# Patient Record
Sex: Male | Born: 1935
Health system: Southern US, Community
[De-identification: ages and names within clinical notes are randomized; demographics above are authoritative.]

## PROBLEM LIST (undated history)

## (undated) DIAGNOSIS — K219 Gastro-esophageal reflux disease without esophagitis: Secondary | ICD-10-CM

## (undated) DIAGNOSIS — R32 Unspecified urinary incontinence: Secondary | ICD-10-CM

## (undated) DIAGNOSIS — D649 Anemia, unspecified: Secondary | ICD-10-CM

## (undated) DIAGNOSIS — N319 Neuromuscular dysfunction of bladder, unspecified: Secondary | ICD-10-CM

## (undated) DIAGNOSIS — N184 Chronic kidney disease, stage 4 (severe): Secondary | ICD-10-CM

## (undated) DIAGNOSIS — I2589 Other forms of chronic ischemic heart disease: Secondary | ICD-10-CM

## (undated) DIAGNOSIS — I5042 Chronic combined systolic (congestive) and diastolic (congestive) heart failure: Secondary | ICD-10-CM

## (undated) DIAGNOSIS — J4520 Mild intermittent asthma, uncomplicated: Secondary | ICD-10-CM

## (undated) DIAGNOSIS — N19 Unspecified kidney failure: Secondary | ICD-10-CM

## (undated) DIAGNOSIS — I2510 Atherosclerotic heart disease of native coronary artery without angina pectoris: Secondary | ICD-10-CM

## (undated) DIAGNOSIS — I5022 Chronic systolic (congestive) heart failure: Secondary | ICD-10-CM

## (undated) DIAGNOSIS — L0390 Cellulitis, unspecified: Secondary | ICD-10-CM

## (undated) DIAGNOSIS — I10 Essential (primary) hypertension: Secondary | ICD-10-CM

## (undated) DIAGNOSIS — F419 Anxiety disorder, unspecified: Secondary | ICD-10-CM

## (undated) DIAGNOSIS — F329 Major depressive disorder, single episode, unspecified: Secondary | ICD-10-CM

## (undated) DIAGNOSIS — N4289 Other specified disorders of prostate: Secondary | ICD-10-CM

## (undated) DIAGNOSIS — Z7901 Long term (current) use of anticoagulants: Secondary | ICD-10-CM

## (undated) DIAGNOSIS — I499 Cardiac arrhythmia, unspecified: Secondary | ICD-10-CM

## (undated) DIAGNOSIS — I219 Acute myocardial infarction, unspecified: Secondary | ICD-10-CM

## (undated) DIAGNOSIS — R110 Nausea: Secondary | ICD-10-CM

## (undated) DIAGNOSIS — I447 Left bundle-branch block, unspecified: Secondary | ICD-10-CM

## (undated) DIAGNOSIS — N390 Urinary tract infection, site not specified: Secondary | ICD-10-CM

## (undated) DIAGNOSIS — R5381 Other malaise: Secondary | ICD-10-CM

## (undated) DIAGNOSIS — I50810 Right heart failure, unspecified: Secondary | ICD-10-CM

## (undated) DIAGNOSIS — I4821 Permanent atrial fibrillation: Secondary | ICD-10-CM

## (undated) DIAGNOSIS — E785 Hyperlipidemia, unspecified: Secondary | ICD-10-CM

## (undated) DIAGNOSIS — I5043 Acute on chronic combined systolic (congestive) and diastolic (congestive) heart failure: Secondary | ICD-10-CM

## (undated) DIAGNOSIS — I38 Endocarditis, valve unspecified: Secondary | ICD-10-CM

## (undated) DIAGNOSIS — M130 Polyarthritis, unspecified: Secondary | ICD-10-CM

## (undated) DIAGNOSIS — K869 Disease of pancreas, unspecified: Secondary | ICD-10-CM

## (undated) HISTORY — DX: Hyperlipidemia, unspecified: E78.5

## (undated) HISTORY — PX: CHOLECYSTECTOMY: SHX55

## (undated) HISTORY — DX: Acute myocardial infarction, unspecified: I21.9

## (undated) HISTORY — DX: Cardiac arrhythmia, unspecified: I49.9

## (undated) HISTORY — DX: Anxiety disorder, unspecified: F41.9

## (undated) HISTORY — DX: Long term (current) use of anticoagulants: Z79.01

## (undated) HISTORY — DX: Neuromuscular dysfunction of bladder, unspecified: N31.9

## (undated) HISTORY — DX: Essential (primary) hypertension: I10

## (undated) HISTORY — DX: Chronic systolic (congestive) heart failure: I50.22

## (undated) HISTORY — PX: CORONARY ARTERY BYPASS GRAFT: SHX141

## (undated) HISTORY — DX: Gastro-esophageal reflux disease without esophagitis: K21.9

## (undated) HISTORY — DX: Anemia, unspecified: D64.9

## (undated) HISTORY — DX: Unspecified kidney failure: N19

## (undated) HISTORY — PX: APPENDECTOMY: SHX54

---
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
## 2006-03-01 ENCOUNTER — Ambulatory Visit: Payer: Self-pay | Admitting: Family Medicine

## 2006-03-19 ENCOUNTER — Ambulatory Visit: Payer: Self-pay | Admitting: Sports Medicine

## 2006-04-17 ENCOUNTER — Ambulatory Visit: Payer: Self-pay | Admitting: Cardiology

## 2006-04-23 ENCOUNTER — Ambulatory Visit: Payer: Self-pay | Admitting: Cardiology

## 2006-05-07 ENCOUNTER — Ambulatory Visit: Payer: Self-pay | Admitting: Cardiology

## 2006-05-14 ENCOUNTER — Ambulatory Visit: Payer: Self-pay | Admitting: Cardiology

## 2006-05-23 ENCOUNTER — Ambulatory Visit: Payer: Self-pay | Admitting: Cardiovascular Disease

## 2006-06-13 ENCOUNTER — Ambulatory Visit: Payer: Self-pay | Admitting: Cardiology

## 2006-06-13 LAB — CONVERTED CEMR LAB
ALT: 17 units/L (ref 0–40)
AST: 28 units/L (ref 0–37)
Albumin: 4.1 g/dL (ref 3.5–5.2)
Alkaline Phosphatase: 46 units/L (ref 39–117)
BUN: 48 mg/dL — ABNORMAL HIGH (ref 6–23)
Chloride: 102 meq/L (ref 96–112)
Creatinine, Ser: 2.7 mg/dL — ABNORMAL HIGH (ref 0.4–1.5)
Total Bilirubin: 1 mg/dL (ref 0.3–1.2)

## 2006-07-11 ENCOUNTER — Ambulatory Visit: Payer: Self-pay | Admitting: *Deleted

## 2006-07-25 ENCOUNTER — Ambulatory Visit: Payer: Self-pay | Admitting: Cardiology

## 2006-08-06 ENCOUNTER — Ambulatory Visit: Payer: Self-pay | Admitting: Internal Medicine

## 2006-08-22 ENCOUNTER — Ambulatory Visit: Payer: Self-pay | Admitting: Internal Medicine

## 2006-08-22 ENCOUNTER — Ambulatory Visit: Payer: Self-pay | Admitting: Cardiology

## 2006-08-22 LAB — CONVERTED CEMR LAB
BUN: 36 mg/dL — ABNORMAL HIGH (ref 6–23)
CO2: 32 meq/L (ref 19–32)
Chloride: 101 meq/L (ref 96–112)
Creatinine, Ser: 2.1 mg/dL — ABNORMAL HIGH (ref 0.4–1.5)
GFR calc Af Amer: 40 mL/min
Glucose, Bld: 89 mg/dL (ref 70–99)
Potassium: 4.6 meq/L (ref 3.5–5.1)

## 2006-09-07 ENCOUNTER — Encounter: Payer: Self-pay | Admitting: Internal Medicine

## 2006-09-10 ENCOUNTER — Ambulatory Visit: Payer: Self-pay | Admitting: Internal Medicine

## 2006-09-10 DIAGNOSIS — K219 Gastro-esophageal reflux disease without esophagitis: Secondary | ICD-10-CM

## 2006-09-10 DIAGNOSIS — E785 Hyperlipidemia, unspecified: Secondary | ICD-10-CM | POA: Insufficient documentation

## 2006-09-10 DIAGNOSIS — I10 Essential (primary) hypertension: Secondary | ICD-10-CM

## 2006-09-10 DIAGNOSIS — D649 Anemia, unspecified: Secondary | ICD-10-CM

## 2006-09-10 DIAGNOSIS — R32 Unspecified urinary incontinence: Secondary | ICD-10-CM

## 2006-09-10 HISTORY — DX: Anemia, unspecified: D64.9

## 2006-09-10 HISTORY — DX: Essential (primary) hypertension: I10

## 2006-09-10 HISTORY — DX: Unspecified urinary incontinence: R32

## 2006-10-04 ENCOUNTER — Ambulatory Visit: Payer: Self-pay | Admitting: Internal Medicine

## 2006-10-04 DIAGNOSIS — I4821 Permanent atrial fibrillation: Secondary | ICD-10-CM

## 2006-10-04 HISTORY — DX: Permanent atrial fibrillation: I48.21

## 2006-10-04 LAB — CONVERTED CEMR LAB

## 2006-11-08 ENCOUNTER — Telehealth (INDEPENDENT_AMBULATORY_CARE_PROVIDER_SITE_OTHER): Payer: Self-pay | Admitting: *Deleted

## 2006-11-08 ENCOUNTER — Ambulatory Visit: Payer: Self-pay | Admitting: Internal Medicine

## 2006-11-19 ENCOUNTER — Ambulatory Visit: Payer: Self-pay | Admitting: Cardiology

## 2006-11-20 ENCOUNTER — Ambulatory Visit: Payer: Self-pay | Admitting: Internal Medicine

## 2006-12-12 ENCOUNTER — Ambulatory Visit: Payer: Self-pay | Admitting: Internal Medicine

## 2006-12-12 DIAGNOSIS — F4323 Adjustment disorder with mixed anxiety and depressed mood: Secondary | ICD-10-CM | POA: Insufficient documentation

## 2006-12-12 LAB — CONVERTED CEMR LAB: INR: 2.9

## 2006-12-25 ENCOUNTER — Telehealth: Payer: Self-pay | Admitting: Internal Medicine

## 2007-01-01 ENCOUNTER — Ambulatory Visit: Payer: Self-pay | Admitting: Internal Medicine

## 2007-01-01 LAB — CONVERTED CEMR LAB: INR: 2.1

## 2007-01-04 ENCOUNTER — Telehealth (INDEPENDENT_AMBULATORY_CARE_PROVIDER_SITE_OTHER): Payer: Self-pay | Admitting: *Deleted

## 2007-01-16 ENCOUNTER — Telehealth (INDEPENDENT_AMBULATORY_CARE_PROVIDER_SITE_OTHER): Payer: Self-pay | Admitting: *Deleted

## 2007-02-14 ENCOUNTER — Ambulatory Visit: Payer: Self-pay | Admitting: Internal Medicine

## 2007-02-25 ENCOUNTER — Ambulatory Visit: Payer: Self-pay | Admitting: Cardiology

## 2007-03-08 ENCOUNTER — Ambulatory Visit: Payer: Self-pay | Admitting: Internal Medicine

## 2007-03-08 LAB — CONVERTED CEMR LAB: INR: 1.8

## 2007-03-18 ENCOUNTER — Telehealth (INDEPENDENT_AMBULATORY_CARE_PROVIDER_SITE_OTHER): Payer: Self-pay | Admitting: *Deleted

## 2007-03-18 ENCOUNTER — Ambulatory Visit: Payer: Self-pay | Admitting: Internal Medicine

## 2007-03-18 DIAGNOSIS — N19 Unspecified kidney failure: Secondary | ICD-10-CM

## 2007-03-28 ENCOUNTER — Ambulatory Visit: Payer: Self-pay | Admitting: Internal Medicine

## 2007-03-29 ENCOUNTER — Encounter: Payer: Self-pay | Admitting: Internal Medicine

## 2007-04-15 ENCOUNTER — Ambulatory Visit: Payer: Self-pay | Admitting: Internal Medicine

## 2007-04-15 LAB — CONVERTED CEMR LAB: INR: 2.1

## 2007-05-16 ENCOUNTER — Ambulatory Visit: Payer: Self-pay | Admitting: Internal Medicine

## 2007-05-16 LAB — CONVERTED CEMR LAB: INR: 2

## 2007-05-22 ENCOUNTER — Ambulatory Visit: Payer: Self-pay | Admitting: Cardiology

## 2007-06-17 ENCOUNTER — Ambulatory Visit: Payer: Self-pay | Admitting: Internal Medicine

## 2007-06-17 DIAGNOSIS — I2589 Other forms of chronic ischemic heart disease: Secondary | ICD-10-CM

## 2007-06-17 HISTORY — DX: Other forms of chronic ischemic heart disease: I25.89

## 2007-06-17 LAB — CONVERTED CEMR LAB: INR: 1.7

## 2007-06-27 ENCOUNTER — Emergency Department (HOSPITAL_COMMUNITY): Admission: EM | Admit: 2007-06-27 | Discharge: 2007-06-27 | Payer: Self-pay | Admitting: Emergency Medicine

## 2007-07-01 LAB — CONVERTED CEMR LAB
BUN: 40 mg/dL — ABNORMAL HIGH (ref 6–23)
Calcium: 9.9 mg/dL (ref 8.4–10.5)
Creatinine, Ser: 2.2 mg/dL — ABNORMAL HIGH (ref 0.4–1.5)
Eosinophils Absolute: 0.2 10*3/uL (ref 0.0–0.7)
Eosinophils Relative: 2.4 % (ref 0.0–5.0)
GFR calc Af Amer: 38 mL/min
GFR calc non Af Amer: 31 mL/min
Glucose, Bld: 93 mg/dL (ref 70–99)
HCT: 39.3 % (ref 39.0–52.0)
HDL: 30.6 mg/dL — ABNORMAL LOW (ref >39.0)
Hemoglobin: 13.1 g/dL (ref 13.0–17.0)
MCV: 88.2 fL (ref 78.0–100.0)
Monocytes Absolute: 0.5 10*3/uL (ref 0.1–1.0)
Monocytes Relative: 8.3 % (ref 3.0–12.0)
Neutro Abs: 4.2 10*3/uL (ref 1.4–7.7)
Platelets: 218 10*3/uL (ref 150–400)
Potassium: 4.6 meq/L (ref 3.5–5.1)
RDW: 13.6 % (ref 11.5–14.6)
Saturation Ratios: 15 % — ABNORMAL LOW (ref 20.0–50.0)
TSH: 1.62 microintl units/mL (ref 0.35–5.50)
Triglycerides: 104 mg/dL (ref 0–149)
WBC: 6.5 10*3/uL (ref 4.5–10.5)

## 2007-07-31 ENCOUNTER — Ambulatory Visit: Payer: Self-pay | Admitting: Internal Medicine

## 2007-07-31 LAB — CONVERTED CEMR LAB
INR: 3.2
Prothrombin Time: 21.7 s

## 2007-08-07 ENCOUNTER — Encounter: Payer: Self-pay | Admitting: Internal Medicine

## 2007-08-17 ENCOUNTER — Emergency Department (HOSPITAL_BASED_OUTPATIENT_CLINIC_OR_DEPARTMENT_OTHER): Admission: EM | Admit: 2007-08-17 | Discharge: 2007-08-18 | Payer: Self-pay | Admitting: Emergency Medicine

## 2007-08-19 ENCOUNTER — Ambulatory Visit: Payer: Self-pay | Admitting: Internal Medicine

## 2007-08-19 LAB — CONVERTED CEMR LAB
INR: 4.1
Prothrombin Time: 24.6 s
WBC, UA: NONE SEEN cells/hpf (ref <3)

## 2007-08-20 ENCOUNTER — Encounter: Payer: Self-pay | Admitting: Internal Medicine

## 2007-08-21 ENCOUNTER — Ambulatory Visit: Payer: Self-pay | Admitting: Internal Medicine

## 2007-08-21 LAB — CONVERTED CEMR LAB: INR: 1.9

## 2007-09-03 ENCOUNTER — Ambulatory Visit: Payer: Self-pay | Admitting: Cardiology

## 2007-09-04 ENCOUNTER — Ambulatory Visit: Payer: Self-pay | Admitting: Internal Medicine

## 2007-09-04 LAB — CONVERTED CEMR LAB: INR: 2.2

## 2007-09-05 ENCOUNTER — Encounter: Payer: Self-pay | Admitting: Cardiology

## 2007-09-05 ENCOUNTER — Ambulatory Visit: Payer: Self-pay

## 2007-09-16 ENCOUNTER — Ambulatory Visit: Payer: Self-pay | Admitting: Internal Medicine

## 2007-09-16 LAB — CONVERTED CEMR LAB
INR: 3.4
Prothrombin Time: 22.4 s

## 2007-10-11 ENCOUNTER — Ambulatory Visit: Payer: Self-pay | Admitting: Internal Medicine

## 2007-10-11 LAB — CONVERTED CEMR LAB
INR: 1.6
Prothrombin Time: 15.6 s

## 2007-10-31 ENCOUNTER — Emergency Department (HOSPITAL_BASED_OUTPATIENT_CLINIC_OR_DEPARTMENT_OTHER): Admission: EM | Admit: 2007-10-31 | Discharge: 2007-10-31 | Payer: Self-pay | Admitting: Emergency Medicine

## 2007-11-01 ENCOUNTER — Ambulatory Visit: Payer: Self-pay | Admitting: Internal Medicine

## 2007-11-01 LAB — CONVERTED CEMR LAB: Prothrombin Time: 16.9 s

## 2007-11-13 ENCOUNTER — Ambulatory Visit: Payer: Self-pay | Admitting: Internal Medicine

## 2007-11-19 ENCOUNTER — Encounter (INDEPENDENT_AMBULATORY_CARE_PROVIDER_SITE_OTHER): Payer: Self-pay | Admitting: *Deleted

## 2007-12-02 ENCOUNTER — Ambulatory Visit: Payer: Self-pay | Admitting: Internal Medicine

## 2007-12-02 LAB — CONVERTED CEMR LAB: INR: 1.8

## 2007-12-04 ENCOUNTER — Ambulatory Visit: Payer: Self-pay | Admitting: Cardiology

## 2007-12-20 ENCOUNTER — Ambulatory Visit: Payer: Self-pay | Admitting: Internal Medicine

## 2007-12-20 LAB — CONVERTED CEMR LAB: Prothrombin Time: 25.4 s

## 2007-12-26 ENCOUNTER — Ambulatory Visit: Payer: Self-pay | Admitting: Internal Medicine

## 2007-12-26 LAB — CONVERTED CEMR LAB
INR: 1.5
Prothrombin Time: 15.1 s

## 2008-01-03 ENCOUNTER — Ambulatory Visit: Payer: Self-pay | Admitting: Internal Medicine

## 2008-01-03 LAB — CONVERTED CEMR LAB: Prothrombin Time: 18 s

## 2008-01-31 ENCOUNTER — Ambulatory Visit: Payer: Self-pay | Admitting: Internal Medicine

## 2008-01-31 LAB — CONVERTED CEMR LAB: INR: 2.5

## 2008-02-12 ENCOUNTER — Ambulatory Visit: Payer: Self-pay | Admitting: Internal Medicine

## 2008-02-12 LAB — CONVERTED CEMR LAB: Prothrombin Time: 17.4 s

## 2008-02-25 ENCOUNTER — Ambulatory Visit: Payer: Self-pay | Admitting: Internal Medicine

## 2008-03-16 ENCOUNTER — Ambulatory Visit: Payer: Self-pay | Admitting: Internal Medicine

## 2008-03-16 LAB — CONVERTED CEMR LAB
INR: 1.8
Prothrombin Time: 16.4 s

## 2008-04-06 ENCOUNTER — Ambulatory Visit: Payer: Self-pay | Admitting: Internal Medicine

## 2008-04-06 LAB — CONVERTED CEMR LAB: Prothrombin Time: 16.9 s

## 2008-04-27 ENCOUNTER — Ambulatory Visit: Payer: Self-pay | Admitting: Internal Medicine

## 2008-04-27 LAB — CONVERTED CEMR LAB
INR: 1.6
Prothrombin Time: 15.7 s

## 2008-05-08 ENCOUNTER — Ambulatory Visit: Payer: Self-pay | Admitting: Internal Medicine

## 2008-05-20 ENCOUNTER — Ambulatory Visit: Payer: Self-pay | Admitting: Internal Medicine

## 2008-05-20 LAB — CONVERTED CEMR LAB
INR: 2.8
Prothrombin Time: 20.2 s

## 2008-06-10 ENCOUNTER — Ambulatory Visit: Payer: Self-pay | Admitting: Internal Medicine

## 2008-06-10 LAB — CONVERTED CEMR LAB: Prothrombin Time: 16.3 s

## 2008-06-12 ENCOUNTER — Encounter (INDEPENDENT_AMBULATORY_CARE_PROVIDER_SITE_OTHER): Payer: Self-pay | Admitting: *Deleted

## 2008-06-19 ENCOUNTER — Ambulatory Visit: Payer: Self-pay | Admitting: Internal Medicine

## 2008-06-19 LAB — CONVERTED CEMR LAB
INR: 1.9
Prothrombin Time: 16.8 s

## 2008-07-02 ENCOUNTER — Ambulatory Visit: Payer: Self-pay | Admitting: Internal Medicine

## 2008-07-02 LAB — CONVERTED CEMR LAB: Prothrombin Time: 16.3 s

## 2008-07-10 ENCOUNTER — Encounter: Payer: Self-pay | Admitting: Internal Medicine

## 2008-07-17 ENCOUNTER — Ambulatory Visit: Payer: Self-pay | Admitting: Family Medicine

## 2008-07-31 ENCOUNTER — Ambulatory Visit: Payer: Self-pay | Admitting: Internal Medicine

## 2008-07-31 LAB — CONVERTED CEMR LAB
INR: 1.7
Prothrombin Time: 16.1 s

## 2008-08-07 ENCOUNTER — Ambulatory Visit: Payer: Self-pay | Admitting: Cardiology

## 2008-08-21 ENCOUNTER — Ambulatory Visit: Payer: Self-pay | Admitting: Internal Medicine

## 2008-08-21 LAB — CONVERTED CEMR LAB: INR: 2.1

## 2008-09-10 ENCOUNTER — Telehealth (INDEPENDENT_AMBULATORY_CARE_PROVIDER_SITE_OTHER): Payer: Self-pay | Admitting: *Deleted

## 2008-09-10 ENCOUNTER — Ambulatory Visit: Payer: Self-pay | Admitting: Family Medicine

## 2008-09-10 LAB — CONVERTED CEMR LAB
INR: 2.3
Prothrombin Time: 18.7 s

## 2008-10-14 ENCOUNTER — Ambulatory Visit: Payer: Self-pay | Admitting: Internal Medicine

## 2008-10-14 LAB — CONVERTED CEMR LAB: INR: 2.8

## 2008-10-27 ENCOUNTER — Telehealth: Payer: Self-pay | Admitting: Internal Medicine

## 2008-11-05 ENCOUNTER — Telehealth (INDEPENDENT_AMBULATORY_CARE_PROVIDER_SITE_OTHER): Payer: Self-pay | Admitting: *Deleted

## 2008-11-18 ENCOUNTER — Encounter (INDEPENDENT_AMBULATORY_CARE_PROVIDER_SITE_OTHER): Payer: Self-pay | Admitting: *Deleted

## 2008-11-19 ENCOUNTER — Ambulatory Visit: Payer: Self-pay | Admitting: Internal Medicine

## 2008-12-24 ENCOUNTER — Ambulatory Visit: Payer: Self-pay | Admitting: Internal Medicine

## 2009-01-14 ENCOUNTER — Encounter (INDEPENDENT_AMBULATORY_CARE_PROVIDER_SITE_OTHER): Payer: Self-pay | Admitting: *Deleted

## 2009-01-25 ENCOUNTER — Telehealth (INDEPENDENT_AMBULATORY_CARE_PROVIDER_SITE_OTHER): Payer: Self-pay | Admitting: *Deleted

## 2009-01-26 ENCOUNTER — Telehealth (INDEPENDENT_AMBULATORY_CARE_PROVIDER_SITE_OTHER): Payer: Self-pay | Admitting: *Deleted

## 2009-01-28 ENCOUNTER — Ambulatory Visit: Payer: Self-pay | Admitting: Internal Medicine

## 2009-01-28 LAB — CONVERTED CEMR LAB: INR: 1.5

## 2009-02-15 ENCOUNTER — Ambulatory Visit: Payer: Self-pay | Admitting: Internal Medicine

## 2009-02-15 LAB — CONVERTED CEMR LAB: Prothrombin Time: 21.5 s — ABNORMAL HIGH (ref 9.1–11.7)

## 2009-04-01 ENCOUNTER — Ambulatory Visit: Payer: Self-pay | Admitting: Internal Medicine

## 2009-04-01 LAB — CONVERTED CEMR LAB
INR: 2.6 — ABNORMAL HIGH (ref 0.8–1.0)
Prothrombin Time: 26.9 s — ABNORMAL HIGH (ref 9.1–11.7)

## 2009-06-03 ENCOUNTER — Ambulatory Visit: Payer: Self-pay | Admitting: Internal Medicine

## 2009-06-10 ENCOUNTER — Telehealth (INDEPENDENT_AMBULATORY_CARE_PROVIDER_SITE_OTHER): Payer: Self-pay | Admitting: *Deleted

## 2009-06-16 ENCOUNTER — Ambulatory Visit: Payer: Self-pay | Admitting: Cardiology

## 2009-06-16 DIAGNOSIS — I447 Left bundle-branch block, unspecified: Secondary | ICD-10-CM | POA: Insufficient documentation

## 2009-06-16 HISTORY — DX: Left bundle-branch block, unspecified: I44.7

## 2009-06-17 ENCOUNTER — Encounter (INDEPENDENT_AMBULATORY_CARE_PROVIDER_SITE_OTHER): Payer: Self-pay | Admitting: *Deleted

## 2009-07-02 ENCOUNTER — Ambulatory Visit: Payer: Self-pay | Admitting: Internal Medicine

## 2009-07-08 ENCOUNTER — Ambulatory Visit: Payer: Self-pay | Admitting: Internal Medicine

## 2009-07-13 ENCOUNTER — Telehealth: Payer: Self-pay | Admitting: Internal Medicine

## 2009-07-13 LAB — CONVERTED CEMR LAB
Folate: >20 ng/mL
HDL: 30.1 mg/dL — ABNORMAL LOW (ref >39.00)
Total CHOL/HDL Ratio: 4
Triglycerides: 93 mg/dL (ref 0.0–149.0)
Vitamin B-12: 610 pg/mL (ref 211–911)

## 2009-07-29 ENCOUNTER — Ambulatory Visit: Payer: Self-pay | Admitting: Internal Medicine

## 2009-07-29 LAB — CONVERTED CEMR LAB: INR: 1.6

## 2009-08-02 ENCOUNTER — Encounter: Payer: Self-pay | Admitting: Internal Medicine

## 2009-08-13 ENCOUNTER — Ambulatory Visit: Payer: Self-pay | Admitting: Internal Medicine

## 2009-08-13 LAB — CONVERTED CEMR LAB: INR: 2.5

## 2009-09-07 ENCOUNTER — Ambulatory Visit: Payer: Self-pay | Admitting: Internal Medicine

## 2009-09-07 LAB — CONVERTED CEMR LAB: INR: 2

## 2009-09-29 ENCOUNTER — Ambulatory Visit: Payer: Self-pay | Admitting: Internal Medicine

## 2009-11-03 ENCOUNTER — Ambulatory Visit: Payer: Self-pay | Admitting: Internal Medicine

## 2009-12-02 ENCOUNTER — Ambulatory Visit: Payer: Self-pay | Admitting: Internal Medicine

## 2009-12-02 LAB — CONVERTED CEMR LAB: INR: 2

## 2010-01-25 ENCOUNTER — Ambulatory Visit: Payer: Self-pay | Admitting: Internal Medicine

## 2010-02-10 ENCOUNTER — Ambulatory Visit: Payer: Self-pay | Admitting: Internal Medicine

## 2010-02-18 ENCOUNTER — Ambulatory Visit: Payer: Self-pay | Admitting: Internal Medicine

## 2010-02-18 LAB — CONVERTED CEMR LAB: INR: 2.3

## 2010-03-05 LAB — POCT INR
INR: 1.7
INR: 1.7

## 2010-03-27 LAB — CONVERTED CEMR LAB
ALT: 12 units/L (ref 0–53)
BUN: 39 mg/dL — ABNORMAL HIGH (ref 6–23)
Bilirubin, Direct: 0.1 mg/dL (ref 0.0–0.3)
Calcium: 9.5 mg/dL (ref 8.4–10.5)
Creatinine, Ser: 2.2 mg/dL — ABNORMAL HIGH (ref 0.4–1.5)
Eosinophils Relative: 2.1 % (ref 0.0–5.0)
GFR calc non Af Amer: 30.9 mL/min (ref >60)
INR: 1.9 — ABNORMAL HIGH (ref 0.8–1.0)
Monocytes Absolute: 0.3 10*3/uL (ref 0.1–1.0)
Monocytes Relative: 5.9 % (ref 3.0–12.0)
Neutrophils Relative %: 69.1 % (ref 43.0–77.0)
Platelets: 221 10*3/uL (ref 150.0–400.0)
Total Bilirubin: 0.4 mg/dL (ref 0.3–1.2)
WBC: 5.4 10*3/uL (ref 4.5–10.5)

## 2010-03-31 ENCOUNTER — Encounter: Payer: Self-pay | Admitting: Internal Medicine

## 2010-03-31 ENCOUNTER — Other Ambulatory Visit: Payer: Self-pay | Admitting: Internal Medicine

## 2010-03-31 ENCOUNTER — Ambulatory Visit (INDEPENDENT_AMBULATORY_CARE_PROVIDER_SITE_OTHER): Payer: Medicare Other | Admitting: Internal Medicine

## 2010-03-31 DIAGNOSIS — E785 Hyperlipidemia, unspecified: Secondary | ICD-10-CM

## 2010-03-31 DIAGNOSIS — Z5181 Encounter for therapeutic drug level monitoring: Secondary | ICD-10-CM

## 2010-03-31 DIAGNOSIS — I10 Essential (primary) hypertension: Secondary | ICD-10-CM

## 2010-03-31 DIAGNOSIS — D649 Anemia, unspecified: Secondary | ICD-10-CM

## 2010-03-31 DIAGNOSIS — I4891 Unspecified atrial fibrillation: Secondary | ICD-10-CM

## 2010-03-31 DIAGNOSIS — Z79899 Other long term (current) drug therapy: Secondary | ICD-10-CM

## 2010-03-31 LAB — IBC PANEL: Saturation Ratios: 15.2 % — ABNORMAL LOW (ref 20.0–50.0)

## 2010-03-31 LAB — BASIC METABOLIC PANEL
CO2: 31 mEq/L (ref 19–32)
Calcium: 9.2 mg/dL (ref 8.4–10.5)
Creatinine, Ser: 2.4 mg/dL — ABNORMAL HIGH (ref 0.4–1.5)
Glucose, Bld: 88 mg/dL (ref 70–99)

## 2010-03-31 LAB — HEMOGLOBIN: Hemoglobin: 13.1 g/dL (ref 13.0–17.0)

## 2010-03-31 LAB — CONVERTED CEMR LAB: INR: 2.4

## 2010-03-31 NOTE — Progress Notes (Signed)
Summary: results  Phone Note Outgoing Call Call back at Home Phone [phone]   Reason for Call: Discuss lab or test results Details for Reason: advise patient cholesterol is well controlled Vitamins level normal He continued to have iron deficiency anemia, needs a referral to GI. Further workup? Signed by Alda Berthold Paz MD on 07/12/2009 at 9:02 AM  Summary of Call: Patient stated that he had a work up several years ago [place] for his [place].  All test were negative.  was advised to take MVI with Fe...Marland KitchenMarland Kitchen. does not want GI referral.   suggestions? Dawson Bills  Jul 13, 2009 3:56 PM   Follow-up for Phone Call        I strongly rec GI evaluation, they may or may not recommend more testing. If they do, he may change his mind and let them proceed .  Jose E. Paz MD  Jul 13, 2009 9:49 PM  Patient refuses GI referral.  Statates he has had wkup & will not have another one.  Previous wkup showed anemia was from coumadin & bleeding from cath.........Marland KitchenDawson Bills  Jul 14, 2009 4:18 PM

## 2010-03-31 NOTE — Assessment & Plan Note (Signed)
Summary: pt/swh   Nurse Visit   Vital Signs:  Patient profile:   77 year old male Height:      74 inches Weight:      235 pounds BMI:     30.28 Pulse rate:   64 / minute BP sitting:   120 / 60  Vitals Entered By: Verdie Mosher (July 29, 2009 8:45 AM)  Allergies: 1)  ! Cardizem 2)  ! Detrol La (Tolterodine Tartrate) 3)  ! Sudafed 4)  ! Darvocet 5)  ! Cipro 6)  ! Sulfa 7)  ! Niaspan (Niacin (Antihyperlipidemic)) 8)  ! Advicor (Niacin-Lovastatin) 9)  ! Paxil 10)  ! Amoxicillin 11)  ! Hydrocodone-Acetaminophen (Hydrocodone-Acetaminophen) Laboratory Results   Blood Tests      INR: 1.6   (Normal Range: 0.88-1.12   Therap INR: 2.0-3.5) Comments: current dose 2.5mg  daily except 3.5mg  on M,W,F PT MISSED 3 DAYS, HAVING CATH CHANGED TOMORROW 07/30/09 Per Dr Zachery Dauer current dose,Recheck 2 weeks,  patient informed ----------------------------- noted Tricia Oaxaca E. Matraca Hunkins MD  July 29, 2009 1:14 PM     Orders Added: 1)  Est. Patient Level I [id number] 2)  Protime [id number]

## 2010-03-31 NOTE — Assessment & Plan Note (Signed)
Summary: pt check/kn   Nurse Visit   Vital Signs:  Patient profile:   77 year old male Weight:      230 pounds Pulse rate:   87 / minute Pulse rhythm:   regular BP sitting:   130 / 82  (left arm) Cuff size:   large  Vitals Entered By: Allyn Kenner CMA (November 03, 2009 1:11 PM) CC: Pt/INR   Current Medications (verified): 1)  Clonazepam 1 Mg  Tabs (Clonazepam) .... One By Mouth Twice A Day and Two At Bedtime As Needed 2)  Omeprazole 20 Mg  Cpdr (Omeprazole) .Marland Kitchen.. 1 Once Daily-Office Visit and Labs Due Now 3)  Enalapril Maleate 20 Mg  Tabs (Enalapril Maleate) .Marland Kitchen.. 1 Tab Two Times A Day 4)  Furosemide 40 Mg  Tabs (Furosemide) .... 2 Qam 5)  Carvedilol 25 Mg  Tabs (Carvedilol) .Marland Kitchen.. 1 Tab Two Times A Day 6)  Klor-Con M10 10 Meq  Tbcr (Potassium Chloride Crys Cr) .Marland Kitchen.. 1 Once Daily 7)  Fenofibrate 54 Mg  Tabs (Fenofibrate) .Marland Kitchen.. 1 Tab By Mouth Two Times A Day 8)  Bayer Low Strength 81 Mg  Tbec (Aspirin) .Marland Kitchen.. 1 Tab Two Times A Day 9)  Folic Acid 1 Mg Tabs (Folic Acid) .Marland Kitchen.. 1 Tab Once Daily 10)  Selenium 200 Mcg  Caps (Selenium) .Marland Kitchen.. 1 Cap At Bedtime 11)  Tylenol Extra Strength 500 Mg Tabs (Acetaminophen) .... 2 Tabs Three Times A Day 12)  Glucosamine Chondroitin Complx   Tabs (Glucosamine-Chondroit-Vit C-Mn) .Marland Kitchen.. 1 Tab Two Times A Day 13)  Centrum Silver   Tabs (Multiple Vitamins-Minerals) .Marland Kitchen.. 1 Tab Once Daily 14)  Vitamin B-6 .... 1 Tab Once Daily 15)  Vitamin D 1000 Unit Tabs (Cholecalciferol) .Marland Kitchen.. 1 Tab Once Daily 16)  Levaquin 250 Mg  Tabs (Levofloxacin) .... As Directed 17)  Mupirocin 2 % Oint (Mupirocin) .... As Directed 18)  Warfarin Sodium 2.5 Mg Tabs (Warfarin Sodium) .Marland Kitchen.. 1 Tablet Daily 19)  Warfarin Sodium 2 Mg Tabs (Warfarin Sodium) .... As Directed  Allergies: 1)  ! Cardizem 2)  ! Detrol La (Tolterodine Tartrate) 3)  ! Sudafed 4)  ! Darvocet 5)  ! Cipro 6)  ! Sulfa 7)  ! Niaspan (Niacin (Antihyperlipidemic)) 8)  ! Advicor (Niacin-Lovastatin) 9)  ! Paxil 10)   ! Amoxicillin 11)  ! Hydrocodone-Acetaminophen (Hydrocodone-Acetaminophen)  Laboratory Results   Blood Tests      INR: 2.1   (Normal Range: 0.88-1.12   Therap INR: 2.0-3.5) Comments: has 2.5 mg tabs Takes 1 tab daily.  --------------------------------------- no change, 4 weeks Jose E. Paz MD  November 04, 2009 1:01 PM   Left detailed message for pt. Allyn Kenner CMA  November 04, 2009 1:41 PM     Orders Added: 1)  Est. Patient Level I [id number] 2)  Protime [id number]

## 2010-03-31 NOTE — Progress Notes (Signed)
Summary: SAMPLES OF LEVAQUIN  Phone Note Call from Patient Call back at [facility] Phone [phone]   Caller: Patient Summary of Call: PATIENT SAYS HE NEEDED TO [place] YET??     PLEASE CALL HIM Initial call taken by: Berneta Sages,  June 10, 2009 3:47 PM  Follow-up for Phone Call        patient aware no samples available .Marland KitchenMarland KitchenMalachi Bonds  June 10, 2009 4:46 PM

## 2010-03-31 NOTE — Assessment & Plan Note (Signed)
Summary: PT CHECK///SPH   Nurse Visit   Vital Signs:  Patient profile:   77 year old male Height:      74 inches (187.96 cm) Weight:      224.38 pounds (101.99 kg) BMI:     28.91 Temp:     97.5 degrees F (36.39 degrees C) oral BP sitting:   120 / 70  (left arm) Cuff size:   large  Vitals Entered By: Ernestene Mention CMA (February 18, 2010 9:54 AM)  Allergies: 1)  ! Cardizem 2)  ! Detrol La (Tolterodine Tartrate) 3)  ! Sudafed 4)  ! Darvocet 5)  ! Cipro 6)  ! Sulfa 7)  ! Niaspan (Niacin (Antihyperlipidemic)) 8)  ! Advicor (Niacin-Lovastatin) 9)  ! Paxil 10)  ! Amoxicillin 11)  ! Hydrocodone-Acetaminophen (Hydrocodone-Acetaminophen) Laboratory Results   Blood Tests   Date/Time Received: Ernestene Mention [facility]  February 18, 2010 9:54 AM  Date/Time Reported: Ernestene Mention CMA  February 18, 2010 9:54 AM    INR: 2.3   (Normal Range: 0.88-1.12   Therap INR: 2.0-3.5) Comments: Patient has 2 and 2.5mg  tabs. He take 2.5mg  daily. Pt is aware to continue his current regimen and repeat as planned. FYI--He did schedule office visit as requested. Ernestene Mention CMA  February 18, 2010 9:58 AM  thank you, as far as coumadin---same dose, recheck in 4 weeks Jose E. Paz MD  February 18, 2010 4:43 PM   Patient already aware. Ernestene Mention CMA  February 18, 2010 4:46 PM     Orders Added: 1)  Est. Patient Level I [id number] 2)  Protime [id number]

## 2010-03-31 NOTE — Assessment & Plan Note (Signed)
Summary: roa//pt//lch   Vital Signs:  Patient profile:   77 year old male Height:      74 inches Weight:      230.2 pounds BP sitting:   110 / 60  Vitals Entered By: Dawson Bills (Jul 02, 2009 10:37 AM) CC: rov   History of Present Illness: routine office visit, last seen two years ago for ROV, chart reviewed  history of neurogenic bladder  the patient main concern remains his emotional distress whenever he changes his catheter he usually held  Coumadin 3 days before the change, take clonazepam the days prior and immediately after the catheter change and occ. uses alprazolam (low dose, does not feel like it helps anything) to help w/ nerves and insomnia   h/o  anemia--->   recent labs showed  anemia again         Hypertension-- good ambulatory BPs     Current Medications (verified): 1)  Clonazepam 1 Mg  Tabs (Clonazepam) .Marland Kitchen.. 1po Three Times A Day Prn 2)  Omeprazole 20 Mg  Cpdr (Omeprazole) .Marland Kitchen.. 1 Once Daily-Office Visit and Labs Due Now 3)  Enalapril Maleate 20 Mg  Tabs (Enalapril Maleate) .Marland Kitchen.. 1 Tab Two Times A Day 4)  Furosemide 40 Mg  Tabs (Furosemide) .... 2 Qam 5)  Carvedilol 25 Mg  Tabs (Carvedilol) .Marland Kitchen.. 1 Tab Two Times A Day 6)  Klor-Con M10 10 Meq  Tbcr (Potassium Chloride Crys Cr) .Marland Kitchen.. 1 Once Daily 7)  Fenofibrate 54 Mg  Tabs (Fenofibrate) .Marland Kitchen.. 1 Tab By Mouth Two Times A Day 8)  Bayer Low Strength 81 Mg  Tbec (Aspirin) .Marland Kitchen.. 1 Tab Two Times A Day 9)  Folic Acid 1 Mg Tabs (Folic Acid) .Marland Kitchen.. 1 Tab Once Daily 10)  Selenium 200 Mcg  Caps (Selenium) .Marland Kitchen.. 1 Cap At Bedtime 11)  Tylenol Extra Strength 500 Mg Tabs (Acetaminophen) .... 2 Tabs Three Times A Day 12)  Glucosamine Chondroitin Complx   Tabs (Glucosamine-Chondroit-Vit C-Mn) .Marland Kitchen.. 1 Tab Two Times A Day 13)  Centrum Silver   Tabs (Multiple Vitamins-Minerals) .Marland Kitchen.. 1 Tab Once Daily 14)  Vitamin B-6 .... 1 Tab Once Daily 15)  Vitamin D 1000 Unit Tabs (Cholecalciferol) .Marland Kitchen.. 1 Tab Once Daily 16)  Levaquin 250 Mg  Tabs  (Levofloxacin) .... As Directed 17)  Mupirocin 2 % Oint (Mupirocin) .... As Directed 18)  Alprazolam 0.25 Mg Tabs (Alprazolam) .Marland Kitchen.. 1 As Needed 44 Min Prior To Procedure 19)  Warfarin Sodium 2.5 Mg Tabs (Warfarin Sodium) .Marland Kitchen.. 1 Tablet Daily 20)  Warfarin Sodium 2 Mg Tabs (Warfarin Sodium) .... As Directed  Allergies (verified): 1)  ! Cardizem 2)  ! Detrol La (Tolterodine Tartrate) 3)  ! Sudafed 4)  ! Darvocet 5)  ! Cipro 6)  ! Sulfa 7)  ! Niaspan (Niacin (Antihyperlipidemic)) 8)  ! Advicor (Niacin-Lovastatin) 9)  ! Paxil 10)  ! Amoxicillin 11)  ! Hydrocodone-Acetaminophen (Hydrocodone-Acetaminophen)  Past History:  Past Medical History:  history of neurogenic bladder, has a  bladder catheter  changes at the urology office very 12 weeks 05/2005 anemia--->   colonoscopy:  two polys, EGD: gastroparesis, ? Barrett's ([facility])up        Hyperlipidemia       Hypertension       Myocardial infarction, hx of (1983)       Anticoagulation therapy       Congestive heart failure (2006)       Atrial fibrillation (2006)       GERD  Anxiety       Renal failure  Social History: Reviewed history from 08/01/2008 and no changes required. Married Retired .Marland Kitchen1994 Tobacco Use - Former. quit 25+ yrs ago Alcohol Use - no Regular Exercise - no Drug Use - no  Review of Systems CV:  Denies chest pain or discomfort, palpitations, and swelling of feet. GI:  Denies abdominal pain, nausea, and vomiting; (-) heartburn (-) dysphagia . GU:  complain of bleeding with each change of catheter, no overt hematuria, urine does turn  pink in color for few days..  Physical Exam  General:  alert and well-developed.   Eyes:  slightly  pale  Lungs:  normal respiratory effort, no intercostal retractions, no accessory muscle use, and normal breath sounds.   Heart:  irregular Extremities:  no pretibial edema bilaterally  Psych:  Oriented X3 and not depressed appearing.  moderately anxious   Impression &  Recommendations:  Problem # 1:  FIBRILLATION, ATRIAL (ICD-427.31) on Coumadin,  see  cardiology, coumadin f/u here   His updated medication list for this problem includes:    Carvedilol 25 Mg Tabs (Carvedilol) .Marland Kitchen... 1 tab two times a day    Bayer Low Strength 81 Mg Tbec (Aspirin) .Marland Kitchen... 1 tab two times a day    Warfarin Sodium 2.5 Mg Tabs (Warfarin sodium) .Marland Kitchen... 1 tablet daily    Warfarin Sodium 2 Mg Tabs (Warfarin sodium) .Marland Kitchen... As directed  Orders: Fingerstick [id number]) Protime [id number])  Problem # 2:  HYPERTENSION (ICD-401.9) at goal  His updated medication list for this problem includes:    Enalapril Maleate 20 Mg Tabs (Enalapril maleate) .Marland Kitchen... 1 tab two times a day    Furosemide 40 Mg Tabs (Furosemide) .Marland Kitchen... 2 qam    Carvedilol 25 Mg Tabs (Carvedilol) .Marland Kitchen... 1 tab two times a day  BP today: 110/60 Prior BP: 112/70 (06/16/2009)  Labs Reviewed: K+: 4.5 (06/16/2009) Creat: : 2.2 (06/16/2009)   Chol: 154 (06/17/2007)   HDL: 30.6 (06/17/2007)   LDL: 103 (06/17/2007)   TG: 104 (06/17/2007)  Problem # 3:  ANEMIA NOS (ICD-285.9) had a workup in 2007, he is anemic again labs likely will need further GI workup His updated medication list for this problem includes:    Folic Acid 1 Mg Tabs (Folic acid) .Marland Kitchen... 1 tab once daily  Hgb: 10.7 (06/16/2009)   Hct: 32.5 (06/16/2009)   Platelets: 221.0 (06/16/2009) RBC: 4.22 (06/16/2009)   RDW: 16.7 (06/16/2009)   WBC: 5.4 (06/16/2009) MCV: 77.1 (06/16/2009)   MCHC: 32.9 (06/16/2009) Iron: 59 (06/17/2007)   % Sat: 15.0 (06/17/2007) TSH: 1.62 (06/17/2007)  Problem # 4:  ANXIETY (ICD-300.00) his main concern today remains anxiety related to catheter changes, we spent a great deal of time talking about this issue.  since he is taking clonazepam, a low dose of alprazolam does not make much sense Will recommend the following: continue with clonazepam  one by mouth twice a day and actually two at bedtime as needed There is times that despite the  clonazepam he is unable to sleep, he  uses  Benadryl two tablets and that is perfectly fine discontinue alprazolam The following medications were removed from the medication list:    Alprazolam 0.25 Mg Tabs (Alprazolam) .Marland Kitchen... 1 as needed 30 min prior to procedure His updated medication list for this problem includes:    Clonazepam 1 Mg Tabs (Clonazepam) ..... One by mouth twice a day and two at bedtime as needed  Problem # 5:  URINARY INCONTINENCE (ICD-788.30) see #4  also unable to afford Levaquin, he has not used it  the last few catheter changes without any problems. Wonders if needs another  antibiotic.  In the past ciprofloxacin cause side effects. Plan: Recommend to discuss with urology  Problem # 6:  HYPERLIPIDEMIA (ICD-272.4) due for labs His updated medication list for this problem includes:    Fenofibrate 54 Mg Tabs (Fenofibrate) .Marland Kitchen... 1 tab by mouth two times a day  Labs Reviewed: SGOT: 20 (06/16/2009)   SGPT: 12 (06/16/2009)   HDL:30.6 (06/17/2007)  LDL:103 (06/17/2007)  Chol:154 (06/17/2007)  Trig:104 (06/17/2007)  Problem # 7:  time spent >> 40 min d/t discussion about anxiety  Complete Medication List: 1)  Clonazepam 1 Mg Tabs (Clonazepam) .... One by mouth twice a day and two at bedtime as needed 2)  Omeprazole 20 Mg Cpdr (Omeprazole) .Marland Kitchen.. 1 once daily-office visit and labs due now 3)  Enalapril Maleate 20 Mg Tabs (Enalapril maleate) .Marland Kitchen.. 1 tab two times a day 4)  Furosemide 40 Mg Tabs (Furosemide) .... 2 qam 5)  Carvedilol 25 Mg Tabs (Carvedilol) .Marland Kitchen.. 1 tab two times a day 6)  Klor-con M10 10 Meq Tbcr (Potassium chloride crys cr) .Marland Kitchen.. 1 once daily 7)  Fenofibrate 54 Mg Tabs (Fenofibrate) .Marland Kitchen.. 1 tab by mouth two times a day 8)  Bayer Low Strength 81 Mg Tbec (Aspirin) .Marland Kitchen.. 1 tab two times a day 9)  Folic Acid 1 Mg Tabs (Folic acid) .Marland Kitchen.. 1 tab once daily 10)  Selenium 200 Mcg Caps (Selenium) .Marland Kitchen.. 1 cap at bedtime 11)  Tylenol Extra Strength 500 Mg Tabs (Acetaminophen)  .... 2 tabs three times a day 12)  Glucosamine Chondroitin Complx Tabs (Glucosamine-chondroit-vit c-mn) .Marland Kitchen.. 1 tab two times a day 13)  Centrum Silver Tabs (Multiple vitamins-minerals) .Marland Kitchen.. 1 tab once daily 14)  Vitamin B-6  .... 1 tab once daily 15)  Vitamin D 1000 Unit Tabs (Cholecalciferol) .Marland Kitchen.. 1 tab once daily 16)  Levaquin 250 Mg Tabs (Levofloxacin) .... As directed 17)  Mupirocin 2 % Oint (Mupirocin) .... As directed 18)  Warfarin Sodium 2.5 Mg Tabs (Warfarin sodium) .Marland Kitchen.. 1 tablet daily 19)  Warfarin Sodium 2 Mg Tabs (Warfarin sodium) .... As directed  Patient Instructions: 1)  please come back fasting 2)  FLP   dx high cholesterol 3)  hemoglobin, iron, ferritin, [id number], folic acid  dx anemia  4)  continue taking your Coumadin as you are doing 5)  Please schedule a follow-up appointment in 4 months .   Laboratory Results   Blood Tests      INR: 2.0   (Normal Range: 0.88-1.12   Therap INR: 2.0-3.5) Comments: CURRENT DOSE:  2.5MG  daily except 3.5mg  on M, W, F pt has cath change scheduled for 07/30/09 Dawson Bills  Jul 02, 2009 10:48 AM

## 2010-03-31 NOTE — Assessment & Plan Note (Signed)
Summary: PT CHECK--PH   Nurse Visit   Vital Signs:  Patient profile:   77 year old male Weight:      230.13 pounds Pulse rate:   68 / minute Pulse rhythm:   regular BP sitting:   120 / 80  (left arm) Cuff size:   large  Vitals Entered By: [place] (September 07, 2009 9:08 AM) CC: PT/INR   Allergies: 1)  ! Cardizem 2)  ! Detrol La (Tolterodine Tartrate) 3)  ! Sudafed 4)  ! Darvocet 5)  ! Cipro 6)  ! Sulfa 7)  ! Niaspan (Niacin (Antihyperlipidemic)) 8)  ! Advicor (Niacin-Lovastatin) 9)  ! Paxil 10)  ! Amoxicillin 11)  ! Hydrocodone-Acetaminophen (Hydrocodone-Acetaminophen) Laboratory Results   Blood Tests      INR: 2.0   (Normal Range: 0.88-1.12   Therap INR: 2.0-3.5) Comments: Pt has 2 mg and 2.5 mg tabs. On M, W, F takes 3 1/2 all other days takes 2 1/2. Pt states he missed 2 days of Meds. [place]  September 07, 2009 9:10 AM no change, 4 weeks Lamere Lightner E. Yury Schaus MD  September 08, 2009 12:00 PM   Informed pts wife of directions. Allyn Kenner CMA  September 08, 2009 12:03 PM     Orders Added: 1)  Est. Patient Level I [id number] 2)  Protime [id number]

## 2010-03-31 NOTE — Assessment & Plan Note (Signed)
Summary: PT CHECK///SPH   Nurse Visit   Vital Signs:  Patient profile:   77 year old male Weight:      230 pounds Pulse rate:   83 / minute Pulse rhythm:   regular BP sitting:   122 / 80  (left arm) Cuff size:   large  Vitals Entered By: Allyn Kenner CMA (December 02, 2009 9:11 AM) CC: PT/INR   Allergies: 1)  ! Cardizem 2)  ! Detrol La (Tolterodine Tartrate) 3)  ! Sudafed 4)  ! Darvocet 5)  ! Cipro 6)  ! Sulfa 7)  ! Niaspan (Niacin (Antihyperlipidemic)) 8)  ! Advicor (Niacin-Lovastatin) 9)  ! Paxil 10)  ! Amoxicillin 11)  ! Hydrocodone-Acetaminophen (Hydrocodone-Acetaminophen) Laboratory Results   Blood Tests      INR: 2.0   (Normal Range: 0.88-1.12   Therap INR: 2.0-3.5) Comments: Has 2.5 mg tabs Takes 1 tab daily. Allyn Kenner CMA  December 02, 2009 9:15 AM no change, 4 weeks Jose E. Paz MD  December 02, 2009 12:35 PM   Pt is aware. [place]  December 02, 2009 12:58 PM     Orders Added: 1)  Est. Patient Level I [id number] 2)  Protime [id number]

## 2010-03-31 NOTE — Letter (Signed)
Summary: [facility]  [facility]   Imported By: Edmonia James 08/12/2009 10:23:30  _____________________________________________________________________  External Attachment:    Type:   Image     Comment:   External Document

## 2010-03-31 NOTE — Letter (Signed)
Summary: Med List & Health History from Patient  Med List & Health History from Patient   Imported By: Edmonia James 06/09/2009 09:33:19  _____________________________________________________________________  External Attachment:    Type:   Image     Comment:   External Document

## 2010-03-31 NOTE — Assessment & Plan Note (Signed)
Summary: PT CHECK/KN   Nurse Visit   Vital Signs:  Patient profile:   77 year old male Height:      74 inches (187.96 cm) Weight:      229.38 pounds (104.26 kg) BMI:     29.56 Temp:     97.5 degrees F (36.39 degrees C) oral BP sitting:   100 / 50  (left arm)  Vitals Entered By: Ernestene Mention CMA (January 25, 2010 2:19 PM) CC: PT check/kb   Allergies: 1)  ! Cardizem 2)  ! Detrol La (Tolterodine Tartrate) 3)  ! Sudafed 4)  ! Darvocet 5)  ! Cipro 6)  ! Sulfa 7)  ! Niaspan (Niacin (Antihyperlipidemic)) 8)  ! Advicor (Niacin-Lovastatin) 9)  ! Paxil 10)  ! Amoxicillin 11)  ! Hydrocodone-Acetaminophen (Hydrocodone-Acetaminophen) Laboratory Results   Blood Tests   Date/Time Received: Ernestene Mention [facility]  January 25, 2010 2:19 PM  Date/Time Reported: Ernestene Mention CMA  January 25, 2010 2:19 PM    INR: 1.7   (Normal Range: 0.88-1.12   Therap INR: 2.0-3.5) Comments: Patient notes that he has 2.5mg  tabs and takes one daily. He has eaten greens over the holiday and made me aware that he missed 2 doses. Patient was advised to continue the same and re-check in 4 weeks. He is aware that I will call him at home if MD has other recommendations. Ernestene Mention CMA  January 25, 2010 2:21 PM  continue same medication, please ask patient to come back in 2 weeks also, remind the patient he is due for a routine office visit [place]. Paz MD  January 26, 2010 4:02 PM  Patient spouse notified and she will have him call. Ernestene Mention CMA  January 27, 2010 8:38 AM     Orders Added: 1)  Est. Patient Level I [id number] 2)  Protime [id number]

## 2010-03-31 NOTE — Assessment & Plan Note (Signed)
Summary: PT CHECK//PH  Medications Added CLONAZEPAM 1 MG  TABS (CLONAZEPAM) 1po three times a day prn       Nurse Visit   Vital Signs:  Patient profile:   77 year old male Height:      74 inches Weight:      234 pounds BMI:     30.15 BP sitting:   124 / 80  Vitals Entered By: Dawson Bills (June 03, 2009 10:50 AM) CC: inr Comments Patient is requesting a refill on klonopin 1mg .  Patient states that sometimes he takes it but it does not help.  Wants to know if he can take 2 tablets when he is having really bad anxiety? Dawson Bills  June 03, 2009 10:51 AM    Allergies: 1)  ! Cardizem 2)  ! Detrol La (Tolterodine Tartrate) 3)  ! Sudafed 4)  ! Darvocet 5)  ! Cipro 6)  ! Sulfa 7)  ! Niaspan (Niacin (Antihyperlipidemic)) 8)  ! Advicor (Niacin-Lovastatin) 9)  ! Paxil 10)  ! Amoxicillin 11)  ! Hydrocodone-Acetaminophen (Hydrocodone-Acetaminophen) Laboratory Results   Blood Tests      INR: 2.8   (Normal Range: 0.88-1.12   Therap INR: 2.0-3.5) Comments: CURRENT DOSE: 2.5MG  daily except 3.5mg  on M, W, F No change, recheck in 4 weeks Dawson Bills  June 03, 2009 10:51 AM Jacqulyn Bath E. Malin Sambrano MD  June 03, 2009 12:43 PM     Impression & Recommendations:  Problem # 1:  ANXIETY (ICD-300.00) Patient is requesting a refill on klonopin 1mg .  Patient states that sometimes he takes it but it does not help.  Wants to know if he can take 2 tablets when he is having really bad anxiety? plan: if needed, we can increase to t.i.d., taking 2  tablets at once is probably too much. if as I said here, arrange office visit---SSRIs? His updated medication list for this problem includes:    Clonazepam 1 Mg Tabs (Clonazepam) .Marland Kitchen... 1po three times a day prn discussed with pt Dawson Bills  June 03, 2009 2:07 PM  Complete Medication List: 1)  Clonazepam 1 Mg Tabs (Clonazepam) .Marland Kitchen.. 1po three times a day prn 2)  Omeprazole 20 Mg Cpdr (Omeprazole) .... 2 by mouth qd 3)  Enalapril Maleate 20 Mg  Tabs (Enalapril maleate) .Marland Kitchen.. 1 tab two times a day 4)  Furosemide 40 Mg Tabs (Furosemide) .... 2 qam 5)  Carvedilol 25 Mg Tabs (Carvedilol) .Marland Kitchen.. 1 tab two times a day 6)  Klor-con M10 10 Meq Tbcr (Potassium chloride crys cr) .Marland Kitchen.. 1 once daily 7)  Fenofibrate 54 Mg Tabs (Fenofibrate) .Marland Kitchen.. 1 tab by mouth two times a day 8)  Warfarin Sodium 2 Mg Tabs (Warfarin sodium) .... As directed 9)  Bayer Low Strength 81 Mg Tbec (Aspirin) .Marland Kitchen.. 1 tab two times a day 10)  Folic Acid 1 Mg Tabs (Folic acid) .Marland Kitchen.. 1 tab once daily 11)  Selenium 200 Mcg Caps (Selenium) .Marland Kitchen.. 1 cap at bedtime 12)  Tylenol Extra Strength 500 Mg Tabs (Acetaminophen) .... 2 tabs three times a day 13)  Glucosamine Chondroitin Complx Tabs (Glucosamine-chondroit-vit c-mn) .Marland Kitchen.. 1 tab two times a day 14)  Centrum Silver Tabs (Multiple vitamins-minerals) .Marland Kitchen.. 1 tab once daily 15)  Vitamin B-6  .... 1 tab once daily 16)  Vitamin D 1000 Unit Tabs (Cholecalciferol) .Marland Kitchen.. 1 tab once daily 17)  Levaquin 250 Mg Tabs (Levofloxacin) .... As directed 18)  Prilosec 40 Mg Cpdr (Omeprazole) .Marland Kitchen.. 1 cap  once daily 19)  Mupirocin 2 % Oint (Mupirocin) .... As directed  Other Orders: Protime [id number])   Orders Added: 1)  Est. Patient Level I [id number] 2)  Protime [id number] Prescriptions: CLONAZEPAM 1 MG  TABS (CLONAZEPAM) 1po three times a day prn  #60 x 1   Entered by:   Dawson Bills   Authorized by:   Alda Berthold. Naida Escalante MD   Signed by:   Dawson Bills on 06/03/2009   Method used:   Printed then faxed to ...       [facility] [address] [id number]* (retail)       [place], [place]  [address]       Ph: [phone]       Fax: [phone]   RxID:   [id number]

## 2010-03-31 NOTE — Assessment & Plan Note (Signed)
Summary: PT CHECK////SPH   Nurse Visit   Vital Signs:  Patient profile:   77 year old male Weight:      230 pounds Pulse rate:   88 / minute Pulse rhythm:   regular BP sitting:   126 / 82  (left arm) Cuff size:   large  Vitals Entered By: Allyn Kenner CMA (September 29, 2009 3:49 PM)  Current Medications (verified): 1)  Clonazepam 1 Mg  Tabs (Clonazepam) .... One By Mouth Twice A Day and Two At Bedtime As Needed 2)  Omeprazole 20 Mg  Cpdr (Omeprazole) .Marland Kitchen.. 1 Once Daily-Office Visit and Labs Due Now 3)  Enalapril Maleate 20 Mg  Tabs (Enalapril Maleate) .Marland Kitchen.. 1 Tab Two Times A Day 4)  Furosemide 40 Mg  Tabs (Furosemide) .... 2 Qam 5)  Carvedilol 25 Mg  Tabs (Carvedilol) .Marland Kitchen.. 1 Tab Two Times A Day 6)  Klor-Con M10 10 Meq  Tbcr (Potassium Chloride Crys Cr) .Marland Kitchen.. 1 Once Daily 7)  Fenofibrate 54 Mg  Tabs (Fenofibrate) .Marland Kitchen.. 1 Tab By Mouth Two Times A Day 8)  Bayer Low Strength 81 Mg  Tbec (Aspirin) .Marland Kitchen.. 1 Tab Two Times A Day 9)  Folic Acid 1 Mg Tabs (Folic Acid) .Marland Kitchen.. 1 Tab Once Daily 10)  Selenium 200 Mcg  Caps (Selenium) .Marland Kitchen.. 1 Cap At Bedtime 11)  Tylenol Extra Strength 500 Mg Tabs (Acetaminophen) .... 2 Tabs Three Times A Day 12)  Glucosamine Chondroitin Complx   Tabs (Glucosamine-Chondroit-Vit C-Mn) .Marland Kitchen.. 1 Tab Two Times A Day 13)  Centrum Silver   Tabs (Multiple Vitamins-Minerals) .Marland Kitchen.. 1 Tab Once Daily 14)  Vitamin B-6 .... 1 Tab Once Daily 15)  Vitamin D 1000 Unit Tabs (Cholecalciferol) .Marland Kitchen.. 1 Tab Once Daily 16)  Levaquin 250 Mg  Tabs (Levofloxacin) .... As Directed 17)  Mupirocin 2 % Oint (Mupirocin) .... As Directed 18)  Warfarin Sodium 2.5 Mg Tabs (Warfarin Sodium) .Marland Kitchen.. 1 Tablet Daily 19)  Warfarin Sodium 2 Mg Tabs (Warfarin Sodium) .... As Directed  Allergies: 1)  ! Cardizem 2)  ! Detrol La (Tolterodine Tartrate) 3)  ! Sudafed 4)  ! Darvocet 5)  ! Cipro 6)  ! Sulfa 7)  ! Niaspan (Niacin (Antihyperlipidemic)) 8)  ! Advicor (Niacin-Lovastatin) 9)  ! Paxil 10)  !  Amoxicillin 11)  ! Hydrocodone-Acetaminophen (Hydrocodone-Acetaminophen) Laboratory Results   Blood Tests      INR: 1.8   (Normal Range: 0.88-1.12   Therap INR: 2.0-3.5) Comments: Has 2 mg, and 2.5 mg tabs. Takes 3 1/2 tabs m,w,f takes 2 1/2 tabs all other days. Pt states he missed 2 pills. ------------------------------------ Recommend good compliance, recheck in 2 weeks Jose E. Paz MD  September 29, 2009 4:27 PM   Pt wifes is aware. Allyn Kenner CMA  September 29, 2009 4:32 PM     Orders Added: 1)  Est. Patient Level I [id number] 2)  Protime [id number]

## 2010-03-31 NOTE — Assessment & Plan Note (Signed)
Summary: PT VISIT////SPH   Nurse Visit   Vital Signs:  Patient profile:   77 year old male Height:      74 inches Weight:      231 pounds Temp:     98.0 degrees F oral Pulse rate:   68 / minute BP sitting:   90 / 58  (left arm)  Vitals Entered By: Rolla Flatten CMA (August 13, 2009 2:11 PM) CC: pt/inr check   Allergies: 1)  ! Cardizem 2)  ! Detrol La (Tolterodine Tartrate) 3)  ! Sudafed 4)  ! Darvocet 5)  ! Cipro 6)  ! Sulfa 7)  ! Niaspan (Niacin (Antihyperlipidemic)) 8)  ! Advicor (Niacin-Lovastatin) 9)  ! Paxil 10)  ! Amoxicillin 11)  ! Hydrocodone-Acetaminophen (Hydrocodone-Acetaminophen) Laboratory Results   Blood Tests    Date/Time Reported: August 13, 2009 2:12 PM   INR: 2.5   (Normal Range: 0.88-1.12   Therap INR: 2.0-3.5) Comments: current dose:2.5 mg tab, 2mg  tab, 3 1/2 M,W,F, 2 1/2 all other days..............Marland KitchenFelecia Deloach CMA  August 13, 2009 2:13 PM  no change, recheck in 4 weeks Mollyann Halbert E. Vennie Salsbury MD  August 13, 2009 2:27 PM  DISCUSS WITH PATIENT.......................Marland KitchenFelecia Deloach CMA  August 13, 2009 4:20 PM      Orders Added: 1)  Est. Patient Level I [id number] 2)  Protime [id number]

## 2010-03-31 NOTE — Assessment & Plan Note (Signed)
Summary: rov  Medications Added OMEPRAZOLE 20 MG  CPDR (OMEPRAZOLE) 1 once daily ALPRAZOLAM 0.25 MG TABS (ALPRAZOLAM) 1 AS NEEDED 30 MIN PRIOR TO PROCEDURE        Visit Type:  rov Primary Provider:  Alda Berthold. Paz MD  CC:  no cardiac complaints today.  History of Present Illness: Gary [name] comes in today for evaluation and management of his history of coronary artery disease, ischemic cardiomyopathy, chronic systolic heart failure, anticoagulation, chronic atrial fibrillation, left bundle branch block, chronic renal insufficiency, mitral regurgitation, and tricuspid regurgitation.  Despite these multiple issues, he has a great attitude and has no complaints today. He specifically denies any chest pain, orthopnea, PND, increased peripheral edema, palpitations, or syncope.  His greatest concern is that he anxiety provoked by having his catheter changed. This occurs about once a month. The hematuria is what disturbs him. He has tried Canasa pan in the past but he doesn't work. He would like an alternative for the anxiety.  He stops his Coumadin several days prior and takes antibiotics to avoid any infection.  Looking back through the record, he has not had any significant blood work obtained recently.  Current Medications (verified): 1)  Clonazepam 1 Mg  Tabs (Clonazepam) .Marland Kitchen.. 1po Three Times A Day Prn 2)  Omeprazole 20 Mg  Cpdr (Omeprazole) .Marland Kitchen.. 1 Once Daily 3)  Enalapril Maleate 20 Mg  Tabs (Enalapril Maleate) .Marland Kitchen.. 1 Tab Two Times A Day 4)  Furosemide 40 Mg  Tabs (Furosemide) .... 2 Qam 5)  Carvedilol 25 Mg  Tabs (Carvedilol) .Marland Kitchen.. 1 Tab Two Times A Day 6)  Klor-Con M10 10 Meq  Tbcr (Potassium Chloride Crys Cr) .Marland Kitchen.. 1 Once Daily 7)  Fenofibrate 54 Mg  Tabs (Fenofibrate) .Marland Kitchen.. 1 Tab By Mouth Two Times A Day 8)  Warfarin Sodium 2 Mg Tabs (Warfarin Sodium) .... As Directed 9)  Bayer Low Strength 81 Mg  Tbec (Aspirin) .Marland Kitchen.. 1 Tab Two Times A Day 10)  Folic Acid 1 Mg Tabs (Folic Acid) .Marland Kitchen.. 1  Tab Once Daily 11)  Selenium 200 Mcg  Caps (Selenium) .Marland Kitchen.. 1 Cap At Bedtime 12)  Tylenol Extra Strength 500 Mg Tabs (Acetaminophen) .... 2 Tabs Three Times A Day 13)  Glucosamine Chondroitin Complx   Tabs (Glucosamine-Chondroit-Vit C-Mn) .Marland Kitchen.. 1 Tab Two Times A Day 14)  Centrum Silver   Tabs (Multiple Vitamins-Minerals) .Marland Kitchen.. 1 Tab Once Daily 15)  Vitamin B-6 .... 1 Tab Once Daily 16)  Vitamin D 1000 Unit Tabs (Cholecalciferol) .Marland Kitchen.. 1 Tab Once Daily 17)  Levaquin 250 Mg  Tabs (Levofloxacin) .... As Directed 18)  Mupirocin 2 % Oint (Mupirocin) .... As Directed  Allergies: 1)  ! Cardizem 2)  ! Detrol La (Tolterodine Tartrate) 3)  ! Sudafed 4)  ! Darvocet 5)  ! Cipro 6)  ! Sulfa 7)  ! Niaspan (Niacin (Antihyperlipidemic)) 8)  ! Advicor (Niacin-Lovastatin) 9)  ! Paxil 10)  ! Amoxicillin 11)  ! Hydrocodone-Acetaminophen (Hydrocodone-Acetaminophen)  Past History:  Past Medical History: Last updated: 08/01/2008 Myocardial infarction, hx of (1983)---Cardiologist @ [facility] FIBRILLATION, ATRIAL (ICD-427.31) CARDIOMYOPATHY, ISCHEMIC (ICD-414.8) HYPERTENSION (ICD-401.9) HYPERLIPIDEMIA (ICD-272.4) RENAL FAILURE (ICD-586) GERD (ICD-530.81) ANXIETY (ICD-300.00) ANEMIA NOS (ICD-285.9) NEED PROPHYLACTIC VACCINATION&INOCULATION FLU (ICD-V04.81) GROSS HEMATURIA (ICD-599.71) ENCOUNTER FOR LONG-TERM USE OF OTHER MEDICATIONS (ICD-V58.69) HEALTH MAINTENANCE EXAM (ICD-V70.0) URINARY INCONTINENCE (ICD-788.30) ENCOUNTER FOR THERAPEUTIC DRUG MONITORING (ICD-V58.83) URI (ICD-465.9) Has a Urinary Catheter; changes at the urology office very 12 weeks  (Urology Dr Amalia Hailey)   Past Surgical History: Last updated: 08/01/2008  Appendectomy (1971) Cholecystectomy (1971) Coronary artery bypass graft (1995)  Family History: Last updated: 08/01/2008 Family History of Cancer: Mother deceased at 28 Family History of Coronary Artery Disease: Father deceased at 58 MI, smoking and ETOH Siblings: Sister deceased  at 23 breast ca   Social History: Last updated: 08/01/2008 Married Retired .Marland Kitchen1994 Tobacco Use - Former. quit 25+ yrs ago Alcohol Use - no Regular Exercise - no Drug Use - no  Risk Factors: Alcohol Use: 0 (05/20/2008) Exercise: no (08/01/2008)  Risk Factors: Smoking Status: quit (08/01/2008)  Review of Systems       negative other than history of present illness.  Vital Signs:  Patient profile:   77 year old male Height:      74 inches Weight:      235 pounds BMI:     30.28 Pulse rate:   58 / minute Pulse rhythm:   irregular BP sitting:   112 / 70  (left arm) Cuff size:   large  Vitals Entered By: Julaine Hua, CMA (June 16, 2009 3:18 PM)  Physical Exam  General:  Well developed, well nourished, in no acute distress. Head:  normocephalic and atraumatic Eyes:  PERRLA/EOM intact; conjunctiva and lids normal. Neck:  Neck supple, no JVD. No masses, thyromegaly or abnormal cervical nodes. Chest Gary [name]:  no deformities or breast masses noted Lungs:  Clear bilaterally to auscultation and percussion. Heart:  PMI displaced inferolaterally, variable S1-S2, paradoxically split S2, soft systolic murmur at the apex, no gallop Msk:  decreased ROM.  decreased ROM.   Pulses:  pulses normal in all 4 extremities Extremities:  trace left pedal edema and trace right pedal edema.  trace left pedal edema.   Neurologic:  Alert and oriented x 3. Skin:  Intact without lesions or rashes. Psych:  Normal affect.   Problems:  Medical Problems Added: 1)  Dx of Lbbb  (ICD-426.3)  EKG  Procedure date:  06/16/2009  Findings:      atrial fibrillation, left bundle branch block, no change  Impression & Recommendations:  Problem # 1:  FIBRILLATION, ATRIAL (ICD-427.31) Assessment Unchanged  His updated medication list for this problem includes:    Carvedilol 25 Mg Tabs (Carvedilol) .Marland Kitchen... 1 tab two times a day    Warfarin Sodium 2 Mg Tabs (Warfarin sodium) .Marland Kitchen... As directed    Bayer Low  Strength 81 Mg Tbec (Aspirin) .Marland Kitchen... 1 tab two times a day  Orders: EKG w/ Interpretation (93000)  Problem # 2:  CARDIOMYOPATHY, ISCHEMIC (ICD-414.8) Assessment: Unchanged  His updated medication list for this problem includes:    Enalapril Maleate 20 Mg Tabs (Enalapril maleate) .Marland Kitchen... 1 tab two times a day    Furosemide 40 Mg Tabs (Furosemide) .Marland Kitchen... 2 qam    Carvedilol 25 Mg Tabs (Carvedilol) .Marland Kitchen... 1 tab two times a day    Warfarin Sodium 2 Mg Tabs (Warfarin sodium) .Marland Kitchen... As directed    Bayer Low Strength 81 Mg Tbec (Aspirin) .Marland Kitchen... 1 tab two times a day  Problem # 3:  HYPERTENSION (ICD-401.9) Assessment: Improved  His updated medication list for this problem includes:    Enalapril Maleate 20 Mg Tabs (Enalapril maleate) .Marland Kitchen... 1 tab two times a day    Furosemide 40 Mg Tabs (Furosemide) .Marland Kitchen... 2 qam    Carvedilol 25 Mg Tabs (Carvedilol) .Marland Kitchen... 1 tab two times a day    Bayer Low Strength 81 Mg Tbec (Aspirin) .Marland Kitchen... 1 tab two times a day  Orders: TLB-BMP (Basic Metabolic Panel-BMET) (99991111)  Problem #  4:  HYPERLIPIDEMIA (ICD-272.4) He has not fasted Gary [name] check lipids today. His updated medication list for this problem includes:    Fenofibrate 54 Mg Tabs (Fenofibrate) .Marland Kitchen... 1 tab by mouth two times a day  Problem # 5:  ENCOUNTER FOR LONG-TERM USE OF OTHER MEDICATIONS (ICD-V58.69) Assessment: Unchanged I will check a CBC a protime today.  Problem # 6:  RENAL FAILURE (ICD-586) Assessment: Unchanged I will check a comprehensive metabolic panel today. No change in meds.  Problem # 7:  LBBB (ICD-426.3) Assessment: Unchanged  His updated medication list for this problem includes:    Enalapril Maleate 20 Mg Tabs (Enalapril maleate) .Marland Kitchen... 1 tab two times a day    Carvedilol 25 Mg Tabs (Carvedilol) .Marland Kitchen... 1 tab two times a day    Warfarin Sodium 2 Mg Tabs (Warfarin sodium) .Marland Kitchen... As directed    Bayer Low Strength 81 Mg Tbec (Aspirin) .Marland Kitchen... 1 tab two times a day  His updated  medication list for this problem includes:    Enalapril Maleate 20 Mg Tabs (Enalapril maleate) .Marland Kitchen... 1 tab two times a day    Carvedilol 25 Mg Tabs (Carvedilol) .Marland Kitchen... 1 tab two times a day    Warfarin Sodium 2 Mg Tabs (Warfarin sodium) .Marland Kitchen... As directed    Bayer Low Strength 81 Mg Tbec (Aspirin) .Marland Kitchen... 1 tab two times a day  Other Orders: TLB-CBC Platelet - w/Differential (85025-CBCD) TLB-Hepatic/Liver Function Pnl (80076-HEPATIC) TLB-PT (Protime) (85610-PTP)  Patient Instructions: 1)  Your physician recommends that you schedule a follow-up appointment in: [place] 2)  Your physician recommends that you continue on your current medications as directed. Please refer to the Current Medication list given to you today. 3)  Your physician recommends that you return for lab work [id number] CBC LIVER PT Prescriptions: ALPRAZOLAM 0.25 MG TABS (ALPRAZOLAM) 1 AS NEEDED 30 MIN PRIOR TO PROCEDURE  #10 x 0   Entered by:   Devra Dopp, LPN   Authorized by:   Renella Cunas, MD, [facility]   Signed by:   Devra Dopp, LPN on [id number]   Method used:   Telephoned to ...       [facility] [address] [id number]* (retail)       [place], [place]  [address]       Ph: [phone]       Fax: [phone]   RxID:   [id number] ALPRAZOLAM 0.25 MG TABS (ALPRAZOLAM) 1 AS NEEDED 30 MIN PRIOR TO PROCEDURE  #10 x 0   Entered by:   Devra Dopp, LPN   Authorized by:   Renella Cunas, MD, [facility]   Signed by:   Devra Dopp, LPN on [id number]   Method used:   Print then Give to Patient   RxID:   [id number]

## 2010-03-31 NOTE — Letter (Signed)
Summary: Primary Care Appointment Letter  [facility]   [place], [place] [address]   Phone: [phone]  Fax: [phone]    06/17/2009 MRN: [id number]  Gary [name] [place], [place]  [address]  Dear [name],   Your Primary Care Physician [place] Paz MD has indicated that:    ____X___it is time to schedule an appointment YOU ARE DUE FOR FASTING LABS AND OFFICE VISIT WITH DR. PAZ.    _______you missed your appointment on______ and need to call and          reschedule.    _______you need to have lab work done.    _______you need to schedule an appointment discuss lab or test results.    _______you need to call to reschedule your appointment that is                       scheduled on _________.     Please call our office as soon as possible. Our phone number is [phone]. Our office is open 8a-5p, Monday through Friday.     Thank you,    [place]

## 2010-04-06 NOTE — Assessment & Plan Note (Signed)
Vital Signs:  Patient profile:   77 year old male Weight:      227 pounds Pulse rate:   80 / minute Pulse rhythm:   regular BP sitting:   138 / 88  (left arm) Cuff size:   large  Vitals Entered By: [place] (March 31, 2010 11:23 AM) CC: Follow up visit- medcheck   History of Present Illness: ROV needs multiple refills History of anemia, we talked to him on 06/2009 about this , he refuses further treatment         Hyperlipidemia-- good medication compliance         Hypertension-- ambulatory BPs in the 120/80         Atrial fibrillation -- good medication compliance w/ coumadin         GERD--no history of GERD, well controlled with Prilosec.  from time to to time if he eats spicy food or fatty food, he developed heartburn and nausea. Patient requests a prescription for Zofran 8 mg to take it sporadically due to nausea.  neurogenic bladder,he has a bag  and a catheter, from time to time he developed a rash at the area of the bag. Clotrimazole has helped the best , he  request a prescription           Current Medications (verified): 1)  Clonazepam 1 Mg  Tabs (Clonazepam) .... One By Mouth Twice A Day and Two At Bedtime As Needed 2)  Omeprazole 20 Mg  Cpdr (Omeprazole) .Marland Kitchen.. 1 Once Daily-Office Visit and Labs Due Now 3)  Enalapril Maleate 20 Mg  Tabs (Enalapril Maleate) .Marland Kitchen.. 1 Tab Two Times A Day 4)  Furosemide 40 Mg  Tabs (Furosemide) .... 2 Qam 5)  Carvedilol 25 Mg  Tabs (Carvedilol) .Marland Kitchen.. 1 Tab Two Times A Day 6)  Klor-Con M10 10 Meq  Tbcr (Potassium Chloride Crys Cr) .Marland Kitchen.. 1 Once Daily 7)  Fenofibrate 54 Mg  Tabs (Fenofibrate) .Marland Kitchen.. 1 Tab By Mouth Two Times A Day 8)  Bayer Low Strength 81 Mg  Tbec (Aspirin) .Marland Kitchen.. 1 Tab Two Times A Day 9)  Folic Acid 1 Mg Tabs (Folic Acid) .Marland Kitchen.. 1 Tab Once Daily 10)  Selenium 200 Mcg  Caps (Selenium) .Marland Kitchen.. 1 Cap At Bedtime 11)  Tylenol Extra Strength 500 Mg Tabs (Acetaminophen) .... 2 Tabs Three Times A Day 12)  Glucosamine  Chondroitin Complx   Tabs (Glucosamine-Chondroit-Vit C-Mn) .Marland Kitchen.. 1 Tab Two Times A Day 13)  Centrum Silver   Tabs (Multiple Vitamins-Minerals) .Marland Kitchen.. 1 Tab Once Daily 14)  Vitamin B-6 .... 1 Tab Once Daily 15)  Vitamin D 1000 Unit Tabs (Cholecalciferol) .Marland Kitchen.. 1 Tab Once Daily 16)  Warfarin Sodium 2.5 Mg Tabs (Warfarin Sodium) .Marland Kitchen.. 1 Tablet Daily 17)  Warfarin Sodium 2 Mg Tabs (Warfarin Sodium) .... As Directed  Allergies (verified): 1)  ! Cardizem 2)  ! Detrol La (Tolterodine Tartrate) 3)  ! Sudafed 4)  ! Darvocet 5)  ! Cipro 6)  ! Sulfa 7)  ! Niaspan (Niacin (Antihyperlipidemic)) 8)  ! Advicor (Niacin-Lovastatin) 9)  ! Paxil 10)  ! Amoxicillin 11)  ! Hydrocodone-Acetaminophen (Hydrocodone-Acetaminophen)  Past History:  Past Medical History: Reviewed history from 07/02/2009 and no changes required.  history of neurogenic bladder, has a  bladder catheter  changes at the urology office very 12 weeks 05/2005 anemia--->   colonoscopy:  two polys, EGD: gastroparesis, ? Barrett's ([facility]up        Hyperlipidemia  Hypertension       Myocardial infarction, hx of (1983)       Anticoagulation therapy       Congestive heart failure (2006)       Atrial fibrillation (2006)       GERD       Anxiety       Renal failure  Past Surgical History: Reviewed history from 08/01/2008 and no changes required. Appendectomy (1971) Cholecystectomy (1971) Coronary artery bypass graft (1995)  Social History: Married cares for his wife 24/7 , she is bedridden Retired .Marland Kitchen1994 Tobacco Use - Former. quit 25+ yrs ago Alcohol Use - no Regular Exercise - no Drug Use - no  Review of Systems CV:  Denies chest pain or discomfort and swelling of feet. Resp:  Denies cough and shortness of breath. GI:  Denies abdominal pain, bloody stools, and diarrhea.  Physical Exam  General:  alert, well-developed, and well-nourished.   Lungs:  normal respiratory effort, no intercostal retractions, no accessory  muscle use, and normal breath sounds.   Heart:  irregular Abdomen:  soft, non-tender, no distention, no masses, no guarding, and no rigidity.     Impression & Recommendations:  Problem # 1:  FIBRILLATION, ATRIAL (ICD-427.31) on Coumadin, stable His updated medication list for this problem includes:    Carvedilol 25 Mg Tabs (Carvedilol) .Marland Kitchen... 1 tab two times a day    Bayer Low Strength 81 Mg Tbec (Aspirin) .Marland Kitchen... 1 tab two times a day    Warfarin Sodium 2.5 Mg Tabs (Warfarin sodium) .Marland Kitchen... 1 tablet daily    Warfarin Sodium 2 Mg Tabs (Warfarin sodium) .Marland Kitchen... As directed  Orders: Protime [id number]) TLB-TSH (Thyroid Stimulating Hormone) (84443-TSH)  Problem # 2:  HYPERTENSION (ICD-401.9)  no change, labs His updated medication list for this problem includes:    Enalapril Maleate 20 Mg Tabs (Enalapril maleate) .Marland Kitchen... 1 tab two times a day    Furosemide 40 Mg Tabs (Furosemide) .Marland Kitchen... 2 qam    Carvedilol 25 Mg Tabs (Carvedilol) .Marland Kitchen... 1 tab two times a day    BP today: 138/88 Prior BP: 120/70 (02/18/2010)  Labs Reviewed: K+: 4.5 (06/16/2009) Creat: : 2.2 (06/16/2009)   Chol: 117 (07/08/2009)   HDL: 30.10 (07/08/2009)   LDL: 68 (07/08/2009)   TG: 93.0 (07/08/2009)  Orders: Venipuncture [id number]) TLB-BMP (Basic Metabolic Panel-BMET) (99991111) Specimen Handling (99000)  Problem # 3:  HYPERLIPIDEMIA (ICD-272.4)  labs His updated medication list for this problem includes:    Fenofibrate 54 Mg Tabs (Fenofibrate) .Marland Kitchen... 1 tab by mouth two times a day    Labs Reviewed: SGOT: 20 (06/16/2009)   SGPT: 12 (06/16/2009)   HDL:30.10 (07/08/2009), 30.6 (06/17/2007)  LDL:68 (07/08/2009), 103 (06/17/2007)  Chol:117 (07/08/2009), 154 (06/17/2007)  Trig:93.0 (07/08/2009), 104 (06/17/2007)  Orders: TLB-ALT (SGPT) (84460-ALT) TLB-AST (SGOT) (84450-SGOT) Specimen Handling (99000)  Problem # 4:  GERD (ICD-530.81) symptoms were controlled, from time to time has heartburn and nausea. Request  zofran as needed, I think is okay. no interactions found with Coumadin His updated medication list for this problem includes:    Omeprazole 20 Mg Cpdr (Omeprazole) ..... One tablet twice a day  Problem # 5:  ANEMIA NOS (ICD-285.9)  we discussed this issue  again today, I recommend a GI referral. Further workup? His updated medication list for this problem includes:    Folic Acid 1 Mg Tabs (Folic acid) .Marland Kitchen... 1 tab once daily  Orders: TLB-IBC Pnl (Iron/FE;Transferrin) (83550-IBC) TLB-Hemoglobin (Hgb) (85018-HGB) Specimen Handling (99000)  Complete Medication List: 1)  Clonazepam 1 Mg Tabs (Clonazepam) .... One by mouth twice a day and two at bedtime as needed 2)  Omeprazole 20 Mg Cpdr (Omeprazole) .... One tablet twice a day 3)  Enalapril Maleate 20 Mg Tabs (Enalapril maleate) .Marland Kitchen.. 1 tab two times a day 4)  Furosemide 40 Mg Tabs (Furosemide) .... 2 qam 5)  Carvedilol 25 Mg Tabs (Carvedilol) .Marland Kitchen.. 1 tab two times a day 6)  Klor-con M10 10 Meq Tbcr (Potassium chloride crys cr) .Marland Kitchen.. 1 once daily 7)  Fenofibrate 54 Mg Tabs (Fenofibrate) .Marland Kitchen.. 1 tab by mouth two times a day 8)  Bayer Low Strength 81 Mg Tbec (Aspirin) .Marland Kitchen.. 1 tab two times a day 9)  Folic Acid 1 Mg Tabs (Folic acid) .Marland Kitchen.. 1 tab once daily 10)  Selenium 200 Mcg Caps (Selenium) .Marland Kitchen.. 1 cap at bedtime 11)  Tylenol Extra Strength 500 Mg Tabs (Acetaminophen) .... 2 tabs three times a day 12)  Glucosamine Chondroitin Complx Tabs (Glucosamine-chondroit-vit c-mn) .Marland Kitchen.. 1 tab two times a day 13)  Centrum Silver Tabs (Multiple vitamins-minerals) .Marland Kitchen.. 1 tab once daily 14)  Vitamin B-6  .... 1 tab once daily 15)  Vitamin D 1000 Unit Tabs (Cholecalciferol) .Marland Kitchen.. 1 tab once daily 16)  Warfarin Sodium 2.5 Mg Tabs (Warfarin sodium) .Marland Kitchen.. 1 tablet daily 17)  Warfarin Sodium 2 Mg Tabs (Warfarin sodium) .... As directed 18)  Clotrimazole 1 % Crea (Clotrimazole) .... Apply twice a day as needed 19)  Ondansetron Hcl 8 Mg Tabs (Ondansetron hcl) .... One  by mouth twice a day as needed for nausea  Patient Instructions: 1)  continue with his same Coumadin, recheck in 4 weeks 2)  Please schedule a follow-up appointment in 4 months . fasting, physical exam Prescriptions: FENOFIBRATE 54 MG  TABS (FENOFIBRATE) 1 tab by mouth two times a day  #180 x 3   Entered and Authorized by:   Jacqulyn Bath E. Joydan Gretzinger MD   Signed by:   Alda Berthold. Jerrid Forgette MD on 03/31/2010   Method used:   Print then Give to Patient   RxID:   [id number] CARVEDILOL 25 MG  TABS (CARVEDILOL) 1 tab two times a day  #180 Each x 3   Entered and Authorized by:   Alda Berthold. Dejah Droessler MD   Signed by:   Alda Berthold. Kataya Guimont MD on 03/31/2010   Method used:   Print then Give to Patient   RxID:   [id number] FUROSEMIDE 40 MG  TABS (FUROSEMIDE) 2 qam  #180 x 3   Entered and Authorized by:   Alda Berthold. Micaela Stith MD   Signed by:   Alda Berthold. Aline Wesche MD on 03/31/2010   Method used:   Print then Give to Patient   RxID:   [id number] OMEPRAZOLE 20 MG  CPDR (OMEPRAZOLE) one tablet twice a day  #180 x 3   Entered and Authorized by:   Alda Berthold. Wahneta Derocher MD   Signed by:   Alda Berthold. Holley Wirt MD on 03/31/2010   Method used:   Print then Give to Patient   RxID:   [id number] CLONAZEPAM 1 MG  TABS (CLONAZEPAM) one by mouth twice a day and two at bedtime as needed  #60 x 3   Entered and Authorized by:   Jacqulyn Bath E. Deziyah Arvin MD   Signed by:   Alda Berthold. Jennfier Abdulla MD on 03/31/2010   Method used:   Print then Give to Patient   RxID:   [id number] ONDANSETRON HCL 8 MG TABS (ONDANSETRON HCL) one by mouth twice a  day as needed for nausea  #30 x 3   Entered and Authorized by:   Alda Berthold. Cheyane Ayon MD   Signed by:   Alda Berthold. Franceska Strahm MD on 03/31/2010   Method used:   Print then Give to Patient   RxID:   [id number] CLOTRIMAZOLE 1 % CREA (CLOTRIMAZOLE) apply twice a day as needed  #1 x 6   Entered and Authorized by:   Jacqulyn Bath E. Oliver Neuwirth MD   Signed by:   Alda Berthold. Averey Koning MD on 03/31/2010   Method used:   Print then Give to Patient   RxID:   [id number]    Orders  Added: 1)  Protime [id number] 2)  Venipuncture [id number] 3)  TLB-BMP (Basic Metabolic Panel-BMET) 123456 4)  TLB-ALT (SGPT) [84460-ALT] 5)  TLB-AST (SGOT) [84450-SGOT] 6)  TLB-TSH (Thyroid Stimulating Hormone) [84443-TSH] 7)  TLB-IBC Pnl (Iron/FE;Transferrin) [83550-IBC] 8)  TLB-Hemoglobin (Hgb) [85018-HGB] 9)  Specimen Handling [99000] 10)  Est. Patient Level IV [id number]    Laboratory Results   Blood Tests      INR: 2.4   (Normal Range: 0.88-1.12   Therap INR: 2.0-3.5) Comments: Coumadin 2.5 mg daily

## 2010-05-13 ENCOUNTER — Ambulatory Visit: Payer: Medicare Other | Admitting: Cardiology

## 2010-05-20 ENCOUNTER — Encounter: Payer: Self-pay | Admitting: Cardiology

## 2010-05-25 ENCOUNTER — Other Ambulatory Visit: Payer: Self-pay | Admitting: Internal Medicine

## 2010-06-01 ENCOUNTER — Ambulatory Visit (INDEPENDENT_AMBULATORY_CARE_PROVIDER_SITE_OTHER): Payer: Medicare Other | Admitting: Cardiology

## 2010-06-01 ENCOUNTER — Encounter: Payer: Self-pay | Admitting: Cardiology

## 2010-06-01 VITALS — BP 112/64 | HR 60 | Resp 18 | Ht 73.0 in | Wt 225.0 lb

## 2010-06-01 DIAGNOSIS — I4891 Unspecified atrial fibrillation: Secondary | ICD-10-CM

## 2010-06-01 DIAGNOSIS — I2589 Other forms of chronic ischemic heart disease: Secondary | ICD-10-CM

## 2010-06-01 NOTE — Progress Notes (Signed)
   Patient ID: Gary [name], male    DOB: 07-15-1929, 77 y.o.   MRN: [id number]  HPI  Gary [name] returns for E and M of his Ischemic CM and CAF. He has lost significant by dieting and feels the best he has felt in years. No CP, orthopnea, PND, palpitations. He does have dependent edema. He has a chronic LBBB. EKG is stable today.  He remains on Coumadin..BP 112/64  Pulse 60  Resp 18  Ht 6\' 1"  (1.854 m)  Wt 225 lb (102.059 kg)  BMI 29.69 kg/m2  General Appearance:    Alert, cooperative, no distress, appears stated age  Head:    Normocephalic, without obvious abnormality, atraumatic  Eyes:    PERRL, conjunctiva/corneas clear, EOM's intact, fundi    benign, both eyes  Ears:    Normal TM's and external ear canals, both ears  Nose:   Nares normal, septum midline, mucosa normal, no drainage    or sinus tenderness  Throat:   Lips, mucosa, and tongue normal; teeth and gums normal  Neck:   Supple, symmetrical, trachea midline, no adenopathy;    thyroid:  no enlargement/tenderness/nodules; no carotid   bruit or JVD  Back:     Symmetric, no curvature, ROM normal, no CVA tenderness  Lungs:     Clear to auscultation bilaterally, respirations unlabored  Chest Gary [name]:    No tenderness or deformity   Heart:    Regular rate and rhythm, S1 and S2 normal, no murmur, rub   or gallop  Breast Exam:    No tenderness, masses, or nipple abnormality  Abdomen:     Soft, non-tender, bowel sounds active all four quadrants,    no masses, no organomegaly  Genitalia:    Normal male without lesion, discharge or tenderness  Rectal:    Normal tone, normal prostate, no masses or tenderness;   guaiac negative stool  Extremities:   Extremities normal, atraumatic, no cyanosis or edema  Pulses:   2+ and symmetric all extremities  Skin:   Skin color, texture, turgor normal, no rashes or lesions  Lymph nodes:   Cervical, supraclavicular, and axillary nodes normal  Neurologic:   CNII-XII intact, normal strength, sensation  and reflexes    throughout   b   Review of Systems  All other systems reviewed and are negative.      Physical Exam  Constitutional: He is oriented to person, place, and time. He appears well-nourished.  HENT:  Head: Normocephalic and atraumatic.  Eyes: EOM are normal. Pupils are equal, round, and reactive to light.  Neck: Neck supple. No JVD present. No tracheal deviation present. No thyromegaly present.       No carotid bruits  Cardiovascular: Normal rate and intact distal pulses.        IRR, Parodoxical S2, soft systolic murmur.  Pulmonary/Chest: Effort normal and breath sounds normal.  Abdominal: Soft. Bowel sounds are normal.  Musculoskeletal: Normal range of motion. He exhibits no edema.  Neurological: He is alert and oriented to person, place, and time.  Skin: Skin is warm and dry.  Psychiatric: He has a normal mood and affect.

## 2010-06-01 NOTE — Patient Instructions (Signed)
Your physician recommends that you schedule a follow-up appointment in: 6 months with Dr. Verl Blalock

## 2010-06-02 ENCOUNTER — Ambulatory Visit (INDEPENDENT_AMBULATORY_CARE_PROVIDER_SITE_OTHER): Payer: Medicare Other | Admitting: *Deleted

## 2010-06-02 DIAGNOSIS — Z7901 Long term (current) use of anticoagulants: Secondary | ICD-10-CM

## 2010-06-02 DIAGNOSIS — I4891 Unspecified atrial fibrillation: Secondary | ICD-10-CM

## 2010-06-02 MED ORDER — WARFARIN SODIUM 2.5 MG PO TABS: 2.5000 mg | ORAL_TABLET | Freq: Every day | ORAL | Status: DC

## 2010-06-02 NOTE — Patient Instructions (Addendum)
Pt advise no change recheck 4 weeks Chemira Ronnald Ramp

## 2010-06-11 ENCOUNTER — Emergency Department (HOSPITAL_BASED_OUTPATIENT_CLINIC_OR_DEPARTMENT_OTHER)
Admission: EM | Admit: 2010-06-11 | Discharge: 2010-06-11 | Disposition: A | Discharge status: Home / Self Care | Payer: Medicare Other | Attending: Emergency Medicine | Admitting: Emergency Medicine

## 2010-06-11 DIAGNOSIS — T83091A Other mechanical complication of indwelling urethral catheter, initial encounter: Secondary | ICD-10-CM | POA: Insufficient documentation

## 2010-06-11 DIAGNOSIS — I252 Old myocardial infarction: Secondary | ICD-10-CM | POA: Insufficient documentation

## 2010-06-11 DIAGNOSIS — I4891 Unspecified atrial fibrillation: Secondary | ICD-10-CM | POA: Insufficient documentation

## 2010-06-11 DIAGNOSIS — Z951 Presence of aortocoronary bypass graft: Secondary | ICD-10-CM | POA: Insufficient documentation

## 2010-06-11 DIAGNOSIS — Z79899 Other long term (current) drug therapy: Secondary | ICD-10-CM | POA: Insufficient documentation

## 2010-06-11 DIAGNOSIS — Y846 Urinary catheterization as the cause of abnormal reaction of the patient, or of later complication, without mention of misadventure at the time of the procedure: Secondary | ICD-10-CM | POA: Insufficient documentation

## 2010-06-11 DIAGNOSIS — I509 Heart failure, unspecified: Secondary | ICD-10-CM | POA: Insufficient documentation

## 2010-06-14 ENCOUNTER — Other Ambulatory Visit: Payer: Self-pay | Admitting: Cardiology

## 2010-06-25 ENCOUNTER — Other Ambulatory Visit: Payer: Self-pay | Admitting: Internal Medicine

## 2010-07-09 ENCOUNTER — Emergency Department (HOSPITAL_BASED_OUTPATIENT_CLINIC_OR_DEPARTMENT_OTHER)
Admission: EM | Admit: 2010-07-09 | Discharge: 2010-07-09 | Disposition: A | Discharge status: Home / Self Care | Payer: Medicare Other | Attending: Emergency Medicine | Admitting: Emergency Medicine

## 2010-07-09 ENCOUNTER — Emergency Department (INDEPENDENT_AMBULATORY_CARE_PROVIDER_SITE_OTHER): Payer: Medicare Other

## 2010-07-09 DIAGNOSIS — S9030XA Contusion of unspecified foot, initial encounter: Secondary | ICD-10-CM | POA: Insufficient documentation

## 2010-07-09 DIAGNOSIS — W208XXA Other cause of strike by thrown, projected or falling object, initial encounter: Secondary | ICD-10-CM | POA: Insufficient documentation

## 2010-07-09 DIAGNOSIS — I4891 Unspecified atrial fibrillation: Secondary | ICD-10-CM | POA: Insufficient documentation

## 2010-07-09 DIAGNOSIS — M7989 Other specified soft tissue disorders: Secondary | ICD-10-CM

## 2010-07-09 DIAGNOSIS — L03119 Cellulitis of unspecified part of limb: Secondary | ICD-10-CM | POA: Insufficient documentation

## 2010-07-09 DIAGNOSIS — M79609 Pain in unspecified limb: Secondary | ICD-10-CM

## 2010-07-09 DIAGNOSIS — Z79899 Other long term (current) drug therapy: Secondary | ICD-10-CM | POA: Insufficient documentation

## 2010-07-09 DIAGNOSIS — I252 Old myocardial infarction: Secondary | ICD-10-CM | POA: Insufficient documentation

## 2010-07-09 DIAGNOSIS — I509 Heart failure, unspecified: Secondary | ICD-10-CM | POA: Insufficient documentation

## 2010-07-09 DIAGNOSIS — L02619 Cutaneous abscess of unspecified foot: Secondary | ICD-10-CM | POA: Insufficient documentation

## 2010-07-12 NOTE — Assessment & Plan Note (Signed)
[name] OFFICE NOTE   [name], [name]                      MRN:          [id number]  DATE:02/25/2007                            DOB:          1929-06-20    [name] returns today for follow-up of his complex medical issues.   PROBLEM LIST:  1. Ischemic cardiomyopathy, EF of 20% - 30% with multiple segmental      wall motion abnormalities.  He has moderate mitral regurgitation,      mild to moderate aortic insufficiency and a severely enlarged left      atrium at 6.2 sonometers.  He is Class II [place] Chronic Systolic      Heart Failure.  2. Chronic atrial fibrillation.  3. Left bundle branch block.  4. Hyperlipidemia.  5. Hypertension.   His biggest complaint today is that he can not seem to keep his weight  off.  He lost 5 pounds but gained it back over the holidays.  He denies  any orthopnea, PND or peripheral edema.  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  He says he really would like to be on 2.5 mg  a day, which kept it around 1-1/2 to 2 prior to coming to [place].  I  explained to he and his wife this was not therapeutic.   MEDICATIONS:  Unchanged.   Blood pressure today is 143/87, his pulses 84 and irregular.  His EKG  shows atrial fib with a left bundle.  Weight is 234, identical to  November 19, 2006.  HEENT:  Normocephalic/atraumatic, PERRLA, extraocular movements are  intact, sclerae are clear, facial symmetry is normal.  Alert and  oriented x3, respiratory rate is 18 and labored.  NECK:  Supple, carotids upstrokes were equal bilateral without bruits,  no JVD.  LUNGS:  Clear.  HEART:  Reveals a displaced PMI, he has an irregular rate and rhythm  with a paradoxically split S2.  ABDOMINAL:  Soft, good bowel sounds.  EXTREMITIES:  No edema.  Pulses are intact.   I have opted to make no changes  in [name] regimen today.  He is  doing remarkably well.  We will try to keep his INR around 2 to 2-1/2 to  avoid easy bruising.  I will see him back in 3 months.    Thomas C. Verl Blalock, MD, [facility]  Electronically Signed   TCW/MedQ  DD: 02/25/2007  DT: 02/25/2007  Job #: [id number]   cc:   Kathlene November, MD

## 2010-07-12 NOTE — Assessment & Plan Note (Signed)
[place]                            CARDIOLOGY OFFICE NOTE   Gary [name], Gary [name]                      MRN:          [id number]  DATE:09/03/2007                            DOB:          Jul 11, 1929    HISTORY:  Gary [name] comes in today.  He says he feels remarkably well  with no significant angina, no significant dyspnea on exertion, and he  is able to mow his yard and work in his workshop.  He brought me a  butterfly house today!   PROBLEM LIST:  1. Ischemic cardiomyopathy.  He is status post coronary bypass      grafting x3 in 1995.  He has an ejection fraction of around 30%      with apical dyskinesia, septal akinesia, and generalized      hypokinesia by his last 2D echocardiogram in September 2007.  EF      was actually was 25-35%.  2. Chronic systolic heart failure.  He is currently class II.  3. Severely enlarged left atrium.  4. Chronic atrial fibrillation with a well-controlled ventricular      rate.  5. Anticoagulation.  6. Left bundle-branch block.  7. History of hypertension.  8. Moderate mitral regurgitation and mild-to-moderate aortic      insufficiency.  9. Chronic renal insufficiency.  His last creatinine, he says was      stable.  I do not have a copy of this.  10.Hyperlipidemia.   He looks great today.  As mentioned above, his review of systems are  negative except for the above.   MEDICATIONS:  1. Carvedilol 25 mg p.o. b.i.d.  2. Folic acid daily.  3. Furosemide 80 mg p.o. daily.  4. Coumadin as directed.  5. Enalapril 20 mg p.o. b.i.d.  6. Selenium 200 mg a day.  7. Glucosamine.  8. Multivitamin.  9. [place].  10.Aspirin 81 mg p.o. b.i.d.  11.Pantoprazole 40 mg p.o. daily.  12.Potassium 10 mEq a day.  13.Fenofibrate 54 mg daily.   He is followed by Dr. Larose Kells in primary care, which I had actually  arranged.   PHYSICAL EXAMINATION:  VITAL SIGNS:  His blood pressure 119/68, his  pulse is 60 and irregular.  HEENT:  Unchanged.  SKIN:  Warm and dry.  NEUROLOGIC:  He is alert and oriented x3.  NECK:  No obvious JVD.  Carotids are full.  There is no obvious bruits.  LUNGS:  Clear.  HEART:  Mildly displaced PMI.  He has no S3 gallop.  He is in regular  rate and rhythm.  Soft diastolic murmur along left sternal border.  He  has a soft systolic murmur at the apex.  ABDOMEN:  Soft.  Good bowel sounds.  No midline bruit.  No hepatomegaly.  EXTREMITIES:  No edema.  Pulses are intact.  MUSCULOSKELETAL:  Chronic arthritic changes.   DISCUSSION:  I had a long talk with Gary [name] today.  He is doing  remarkably well.  We need to objectively assess his ejection fraction  again.  If he is still running around 30-35%, we will continue  with his  current medical program and not push for defibrillator.  When I  mentioned this, he got extremely anxious.  Hopefully, we will not have  to go that route.   Otherwise, I will see him back in 3 months.     Thomas C. Verl Blalock, MD, [facility]  Electronically Signed    TCW/MedQ  DD: 09/03/2007  DT: 09/04/2007  Job #: [id number]

## 2010-07-12 NOTE — Assessment & Plan Note (Signed)
[place] OFFICE NOTE   [place], SUHY                      MRN:          [id number]  DATE:12/04/2007                            DOB:          07-15-1929    Mr. Graveline comes in today for further management and followup of his  multiple cardiac issues.  Please see my note from September 03, 2007, for  problem list.   He is doing remarkably well without any orthopnea, PND, or peripheral  edema.  He is having no chest pain, tachy palpitations, presyncope or  syncope.   He is very compliant.  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   We repeated a 2D echocardiogram in July and his EF was 35-40% which was  as good or better than before.  We felt he did not meet criteria for  AICD.  He had a marked enlarged left atrium, moderate mitral  regurgitation which is stable, moderate tricuspid regurgitation,  moderate right atrial dilatation and multiple segmental wall motion  abnormalities consistent with his ischemic heart disease.   CURRENT MEDICATIONS:  Unchanged except he takes Levaquin when he changes  his catheter bag or when he see signs of infection.  We checked with the  [facility], and he had a recent INR which was 1.5.   The rest of his medicines are unchanged.   PHYSICAL EXAMINATION:  GENERAL:  He is in great shape today.  He has got  a great outlook, attitude, and sense of humour.  VITAL SIGNS:  His blood pressure is 122/74, his pulse is 80 and  irregular, his weight is 235 which is stable.  HEENT:  Normal.  NECK:  Carotid upstrokes were equal bilaterally without bruits, no JVD.  Thyroid is not enlarged.  Trachea is midline.  LUNGS:  Clear to auscultation and percussion.  HEART:  Irregular rate and rhythm.  Soft systolic murmur at the apex.  PMI is slightly displaced inferolaterally.  ABDOMEN:  Soft, good bowel sounds.  EXTREMITIES:  Only trace edema on the  right, none on the left.  Pulses  are intact.  His catheter bag is strapped to his right leg and urine  looks clear.  NEUROLOGICAL:  Intact.   ASSESSMENT AND PLAN:  Ms. Sigrist is doing well.  I have made no changes  in his medical program.  We will see him back again in 6 months.     Thomas C. Verl Blalock, MD, [facility]  Electronically Signed    TCW/MedQ  DD: 12/04/2007  DT: 12/05/2007  Job #: [id number]

## 2010-07-12 NOTE — Assessment & Plan Note (Signed)
Gary [name] OFFICE NOTE   Gary, [name]                      MRN:          [id number]  DATE:11/19/2006                            DOB:          September 28, 1929    The patient returns today for follow-up.  Please refer to my note on  August 22, 2006.   After we decreased his potassium to 10 mEq a day, his potassium dropped  to 4.6.  His creatinine was stable at 2.1.   His weight stays between 235 and 240.  He is taking care of his wife  full time and has very little time to get out of the house and exercise.  He has no specific complaints.   His medications are unchanged except for we lowered his fenofibrate to  54 mg a day with his chronic renal insufficiency and decreased his  potassium to 10 mEq a day.   His INR has been running about 1.9.   PHYSICAL EXAMINATION:  VITAL SIGNS:  Blood pressure 144/86, pulse 81 and  regular, weight is 230 pounds on his home scales.  HEENT:  Unchanged.  NECK:  Carotid upstrokes equal bilaterally without bruits.  No JVD.  Thyroid is not enlarged.  Trachea is midline.  LUNGS:  Clear to auscultation.  HEART:  Displaced PMI.  He has no S3 gallop.  ABDOMEN:  Soft with good bowel sounds.  EXTREMITIES:  No edema.  Pulses are brisk.  NEUROLOGY:  Intact.  SKIN:  Chronic ecchymoses.   ASSESSMENT:  The patient is doing well on his current medical program.  We have made no changes.  I will see him back again in three months.     Thomas C. Verl Blalock, MD, [facility]  Electronically Signed    TCW/MedQ  DD: 11/19/2006  DT: 11/19/2006  Job #: [id number]   cc:   Kathlene November, MD

## 2010-07-12 NOTE — Assessment & Plan Note (Signed)
[place] OFFICE NOTE   JESSUP, ARCILA                      MRN:          [id number]  DATE:05/22/2007                            DOB:          1929-06-02    [name] comes in today for further management of the following issues.  1. Ischemic cardiomyopathy, EF 20-30% with multiple segmental wall      motion abnormalities.  He has moderate mitral regurgitation, mild      to moderate aortic insufficiency, severely enlarged left atrium 6.2      cm.  He is stable at class II chronic systolic heart failure.  2. Chronic atrial fibrillation with good rate control.  3. Anticoagulation with excellent compliance.  4. Left bundle branch block.  5. Hyperlipidemia.  6. Hypertension.  7. Chronic renal insufficiency.  His last creatinine was around 2.1.   He is totally without any cardiovascular complaints today.  His  functional capacity has stayed good and he is still currently class II.  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  He has a back  order!  He also mows about 3 or 4 acres of grass and works a lot in the  yard.   His last INR was 2.1.   His medications are unchanged since his last visit.   He is establish with Dr. Larose Kells per my request.  He really likes him and he  is getting blood work with him next month.   He denies any orthopnea, PND or peripheral edema.   His blood pressure today is 106/66, his pulse 78 and regular.  His  weight is 233, down a pound. Skin color is good.  Alert and oriented x3,  very pleasant.  HEENT:  Normocephalic, atraumatic.  PERRLA.  Extraocular movement is  intact.  Sclerae are clear.  Facial symmetry is normal.  Carotid  upstrokes are equal bilaterally without bruits.  There is no JVD.  LUNGS:  Clear.  HEART:  Reveals an irregular rate and rhythm with a paradoxically split  at S2.  There is no gallop.  ABDOMEN:  Soft, good bowel sounds.  No midline bruit.  EXTREMITIES:  Reveal no edema.  Pulses are present.  NEURO:  Intact.  SKIN:  Shows very few ecchymoses.   [name] is doing remarkably well.  I have made no changes in his  medical program.  I will see him back in 3 months.     Thomas C. Verl Blalock, MD, [facility]  Electronically Signed    TCW/MedQ  DD: 05/22/2007  DT: 05/23/2007  Job #: [id number]

## 2010-07-12 NOTE — Assessment & Plan Note (Signed)
Gary [name]                            CARDIOLOGY OFFICE NOTE   VAIL, ARMOR                      MRN:          [id number]  DATE:08/22/2006                            DOB:          19-Jun-1929    Gary [name] returns today for management of his complex medical problems.   Please see my note from April 17, 2006 for complete problem list,  etc.   He is without complaint today. He denies any orthopnea, PND, or  peripheral edema. He is now in our Coumadin clinic and his INR today was  2.1.   I did check a chemistry per his request on June 13, 2006. It shows a  creatinine that increased from high 1.7 to 1.9 range to 2.7. His  potassium was 5.1.   He would like to establish with our primary care group at [facility]. I have recommended Gary [name] for he and his wife.   His current medications are:  1. Coreg 25 mg b.i.d.  2. Folic acid.  3. Furosemide 80 mg.  4. Potassium 20 mEq daily.  5. Coumadin as directed.  6. Enalapril 20 mg b.i.d.  7. Fenofibrate 54 mg b.i.d.  8. Selenium 200 mg daily.  9. Glucosamine chondroitin 650 mg p.o. b.i.d.  10.Multivitamin and mineral daily.  11.Saw palmetto 160 mg b.i.d.  12.Warfarin as directed.  13.Aspirin 81 mg daily.  14.Pantoprazole 40 mg daily.   PHYSICAL EXAMINATION:  GENERAL:  He is in no acute distress. His skin is  pale.  VITAL SIGNS:  Blood pressure 122/74, pulse 73 and irregular. His weight  is 227 which is up 5 pounds.  HEENT:  Normocephalic atraumatic, PERRLA, extraocular movements intact,  sclerae clear, facial symmetry is normal.  NECK:  Supple. Carotid upstrokes are equal bilaterally without bruit.  There is no JVD. Thyroid is not enlarged. Trachea is midline.  LUNGS:  Clear to auscultation.  HEART:  Reveals a displaced PMI. He has a normal S1 and S2 with a  variable rhythm. He has a soft systolic murmur at the apex. There was no  obvious aortic insufficiency. There was no S3  gallop.  ABDOMEN:  Protuberant with good bowel sounds. There is no midline bruit.  There is no hepatomegaly.  EXTREMITIES:  Reveals no cyanosis, clubbing, or edema. Pulses are brisk.  NEUROLOGIC:  Intact. He has an indwelling catheter that is fastened to  his leg.  SKIN:  Has a few ecchymosis, otherwise benign.  MUSCULOSKELETAL:  Chronic arthritic changes.   His electrocardiogram was not repeated today.   ASSESSMENT:  Gary [name] has an increase in his serum creatinine. The last  one I had was in December 2006. I am not sure how much of change there  has been. His potassium is upper limits of normal. He is also on  Fenofibrate which needs to be reduced in renal failure.   PLAN:  1. Decrease Fenofibrate to 54 mg once daily.  2. Decrease potassium to 20 mEq once daily.  3. Continue other medications at present.  4. Draw a BMET today.  5. Establish with  Gary [name] per his request.   I will see him back in three months.     Gary C. Verl Blalock, MD, [name]  Electronically Signed    TCW/MedQ  DD: 08/22/2006  DT: 08/23/2006  Job #: [id number]   cc:   Gary November, MD

## 2010-07-13 ENCOUNTER — Ambulatory Visit (INDEPENDENT_AMBULATORY_CARE_PROVIDER_SITE_OTHER): Payer: Medicare Other | Admitting: Internal Medicine

## 2010-07-13 VITALS — BP 124/78 | HR 71 | Wt 224.3 lb

## 2010-07-13 DIAGNOSIS — L0291 Cutaneous abscess, unspecified: Secondary | ICD-10-CM

## 2010-07-13 DIAGNOSIS — I4891 Unspecified atrial fibrillation: Secondary | ICD-10-CM

## 2010-07-13 DIAGNOSIS — L0390 Cellulitis, unspecified: Secondary | ICD-10-CM

## 2010-07-13 DIAGNOSIS — Z7901 Long term (current) use of anticoagulants: Secondary | ICD-10-CM

## 2010-07-13 HISTORY — DX: Cellulitis, unspecified: L03.90

## 2010-07-13 LAB — POCT INR: INR: 2.3

## 2010-07-13 MED ORDER — DOXYCYCLINE HYCLATE 100 MG PO TABS: 100.0000 mg | ORAL_TABLET | Freq: Two times a day (BID) | ORAL | Status: AC

## 2010-07-13 NOTE — Assessment & Plan Note (Addendum)
Dx w/ cellulitis few days ago, about to finish clinda. Although the swelling has decreased, the area still red and warm, I believe needs more abx  Plan: Doxy x 10 days (see allergy list) See instructions  I stressed the importance to check his INR in 5 days

## 2010-07-13 NOTE — Progress Notes (Signed)
  Subjective:    Patient ID: Gary [name], male    DOB: 25-Feb-1930, 77 y.o.   MRN: [id number]  HPI  ER followup Notes from the ER reviewed, he had an injury of the left foot 5-9, presented to the ER at 5-12, he had cellulitis, prescribed clindamycin. X-ray was negative.  Past Medical History  Diagnosis Date  . Neurogenic bladder     has had bladder catheter changes at the urology office every 12 weeks  . Anemia   . Hyperlipidemia   . Hypertension   . Heart attack     1983  . CHF (congestive heart failure)   . Arrhythmia     afib  . Warfarin anticoagulation   . GERD (gastroesophageal reflux disease)   . Anxiety   . Renal failure    Past Surgical History  Procedure Date  . Appendectomy   . Cholecystectomy     1971  . Coronary artery bypass graft     1995    Review of Systems No fever, swelling has decreased, still hurting. Area of redness has not increased or decreased.     Objective:   Physical Exam  Constitutional: He appears well-developed and well-nourished.  Musculoskeletal:       Feet:       Good L pedal puse, + redness ,slr warmness, see graphic. Toes w/ some echymosis          Assessment & Plan:

## 2010-07-13 NOTE — Patient Instructions (Signed)
Switch to doxycycline Elevate the leg Came back in 5 days to recheck your INR! Call if redness worsen You are due for a physical next month

## 2010-07-15 ENCOUNTER — Telehealth: Payer: Self-pay | Admitting: Internal Medicine

## 2010-07-15 NOTE — Telephone Encounter (Signed)
Patient was given new prescription for stronger antibiotic (Doxycycline)---patient is on Warfarin and said he has started bleeding--was told in past, if bleeding started with new medication, to stop for a day or two, then start taking it again---stopped yesterday--he was going to start taking it again today, but is concerned with the weekend coming up---he has appt for INR on Monday-----what should he do???

## 2010-07-15 NOTE — Assessment & Plan Note (Signed)
[place] OFFICE NOTE   Gary [name], Gary [name]                      MRN:          [id number]  DATE:05/14/2006                            DOB:          1929-12-04    Gary [name] comes back in for close follow up after I initially met him on  April 17, 2006.  Please refer to my note.   He is doing remarkably well.  He has had no complaints.  He is in our  Coumadin clinic now, and his INR was initially 1.6, being adjusted at  present.   His biggest complaint today is that his Nexium is breaking the bank.  He  would like a proton pump generic that he could take in substitute.   His medications are outlined as before.   PHYSICAL EXAMINATION:  VITAL SIGNS:  His blood pressure is 100/60, pulse  68 and irregular.  His weight is 222, down 2.  NECK:  No JVD.  Carotids are full.  LUNGS:  Clear.  HEART:  An irregular rate and rhythm with no gallop.  ABDOMEN:  Soft.  EXTREMITIES:  No edema.   Gary [name] is doing well.  I will ask Dr. Jerline Pain in the Coumadin clinic a  good substitute for Nexium that will not interfere with Coumadin.   He has not had a creatinine drawn in a while.  When his wife returns for  a blood pressure follow up in a couple of weeks, will check a Chem-7.   Otherwise, I will see him back again in 3 months.     Thomas C. Verl Blalock, MD, [facility]  Electronically Signed    TCW/MedQ  DD: 05/14/2006  DT: 05/15/2006  Job #: [id number]   cc:   Talbert Cage, M.D.

## 2010-07-15 NOTE — Telephone Encounter (Signed)
Spoke w/ pt says that he noticed bleeding from cath site once starting atb and stopped coumadin last night then noticed that bleeding had resolved this morning. Says in the past when this has happened that he would stop coumadin for 2-3 days and wants to cancel INR appt for mon. Informed him that we will forward information to Dr. Larose Kells to review w/ recommendations.

## 2010-07-15 NOTE — Telephone Encounter (Signed)
Pt aware of information and appt scheduled to recheck pt/inr

## 2010-07-15 NOTE — Assessment & Plan Note (Signed)
[place] OFFICE NOTE   TUNIS, DUNK                      MRN:          [id number]  DATE:04/17/2006                            DOB:          06-07-1929    [name] is a very pleasant 77 year old gentleman who comes  today to establish with [place] as his cardiologist.   His cardiac history is significant for severe 3 vessel coronary disease,  status post coronary bypass grafting by Dr. Ceasar Mons in 1995.  He has  lived in [place] for a number of years, has now moved back with his son  here.   1. He has been followed by [facility] and his last      echocardiogram was October 20, 2004 which showed an EF of 20% to 30%      with apical dyskinesia, septal akinesia and generalized      hypokinesia.  He has moderate mitral regurgitation, mild to      moderate aortic regurgitation and a severely enlarged left atrium      of 6.2 sonometers. He is currently Class II [place] [employer]      Association congestive failure.  His ambulation is limited by      arthritis.  2. Chronic atrial fibrillation. This was found on EKG October 20, 2004.      He has been on Coumadin ever since.  3. Chronic left bundle branch block.  4. Hyperlipidemia.  5. Hypertension.   He is doing relatively well today.  He denies any orthopnea, PND,  peripheral edema.  He has chronic renal insufficiency and his last  chemistry was 2 months ago.  He would like to establish in our Coumadin  Clinic.   PAST MEDICAL HISTORY:  Other problems include chronic renal  insufficiency with a baseline creatinine of around 2.3 to 2.5.  He has  chronic bladder outlet obstruction and wears an indwelling catheter.  This gets changed every 6 to 8 weeks.  He has chronic anemia with his  last hemoglobin I have of 11.4.  He has had a previous ulcer in his  small intestine in June 2007 which is stable.  He has a history of a  benign  carotid Doppler June 2004.  He had an unexplained neurologic  episode per his records June 2004.  He has had a previous appendectomy,  cholecystectomy in addition to his bypass surgery.   His current medications are:  1. Coreg 25 mg p.o. b.i.d.  2. Folic Acid daily.  3. Furosemide 80 mg daily.  4. Potassium 20 mEq 2 daily.  5. Coumadin as directed.  6. Enalapril 20 mg b.i.d.  7. Fenofibrate 54 mg p.o. b.i.d.  8. Selenium 200 mg daily.  9. Glucosamine Chondroitan b.i.d.  10.Multivitamin and mineral daily.  11.Saw Palmetto 160 mg b.i.d.  12.B-6 daily.  13.Zinc daily.  14.Finasteride 5 mg daily.  15.Nexium 40 mg daily.  16.He takes Tylenol 1300 mg b.i.d. for his arthritis.   He is intolerant of CARDIZEM.   FAMILY HISTORY:  Is remarkable for his father  having a heart attack at  age 4 and is deceased.   SOCIAL HISTORY:  He is retired.  He comes with his wife, that is going  to establish with me as well. He has 4 children.   PHYSICAL EXAMINATION:  He is a very pleasant gentleman in no acute  distress.  His height is 6 foot 2 inches, he weighs 225 pounds.  He says he weighs  217 on his own scales.  He used to weigh 265 two years ago.  With that  weight loss he feels remarkably better.  Blood pressure is 125/71.  His  pulse is 77 and irregular.  HEENT:  Normocephalic.  Atraumatic. PERRLA, extraocular movements  intact.  Sclerae are clear.  Conjunctivae are pink.  Facial asymmetry is  normal.  NECK:  Is supple.  Carotid upstrokes were equal bilaterally without  bruits.  There is no JVD.  Thyroid is not enlarged. Trachea is midline.  LUNGS:  Are clear.  HEART:  Reveals a displaced PMI.  He has a normal S1, S2 with an  irregular rhythm.  He has a soft systolic murmur at the apex and also  one at the left sternal border.  I could hear no aortic insufficiency  today.  ABDOMEN:  Is protuberant.  Good bowel sounds.  No midline bruit.  There  is no hepatomegaly.  EXTREMITIES:  No  cyanosis, clubbing or significant edema.  Pulses are  brisk.  NEUROLOGIC:  Exam is intact.  He has an indwelling catheter that is  fastened to his right leg.  SKIN:  Has a few ecchymoses, otherwise benign.  MUSCULOSKELETAL:  Chronic arthritic changes.   Electrocardiogram shows atrial fib in the left bundle.   ASSESSMENT/PLAN:  [name] has a very complex cardiovascular history.  He is on an excellent medical program and is clinically stable.  I have  made no changes in his program.  He would like to establish in our  Coumadin Clinic and asked me to write him prescriptions for Coumadin 2  and 3 mg.  He seems very educated in taking this medication and in  compliance issues.   I am going to arrange a followup with he and his wife in about 4 weeks  to review things at another time.  I doubt we will make any changes, I  just want to get to know him better.     Thomas C. Verl Blalock, MD, [facility]  Electronically Signed    TCW/MedQ  DD: 04/17/2006  DT: 04/18/2006  Job #: [id number]   cc:   Talbert Cage, M.D.

## 2010-07-15 NOTE — Telephone Encounter (Signed)
Okay to hold 2 doses of Coumadin, and then restart. Come back for an INR in 6 days. Call if further bleed.

## 2010-07-18 ENCOUNTER — Ambulatory Visit: Payer: Medicare Other

## 2010-07-22 ENCOUNTER — Ambulatory Visit (INDEPENDENT_AMBULATORY_CARE_PROVIDER_SITE_OTHER): Payer: Medicare Other | Admitting: Internal Medicine

## 2010-07-22 ENCOUNTER — Encounter: Payer: Self-pay | Admitting: *Deleted

## 2010-07-22 DIAGNOSIS — Z7901 Long term (current) use of anticoagulants: Secondary | ICD-10-CM

## 2010-07-22 DIAGNOSIS — I4891 Unspecified atrial fibrillation: Secondary | ICD-10-CM

## 2010-07-22 NOTE — Progress Notes (Signed)
  Subjective:    Patient ID: Gary [name], male    DOB: [birth year], 77 y.o.   MRN: [id number]  HPI    Review of Systems     Objective:   Physical Exam        Assessment & Plan:  Here  for Coumadin management. Has been off Coumadin for a couple of days, back on it x 4 days: Stay on Coumadin same regimen 2.5 mg daily. Next INR 3 weeks [facility]

## 2010-07-22 NOTE — Patient Instructions (Addendum)
Pt has been off Coumadin for 5 days due to Antiobiotic. Has been back on for 4 days.  Pt states he wants to stay at 2.5 mg daily, he will be back in 2-3 weeks for Pt check. Please advise if this is okay.    Left pt a detailed message of Dr.Paz's instructions- asked pt to call back to verify he received message.   Spoke w/ pt he is aware.

## 2010-08-11 ENCOUNTER — Ambulatory Visit (INDEPENDENT_AMBULATORY_CARE_PROVIDER_SITE_OTHER): Payer: Medicare Other | Admitting: *Deleted

## 2010-08-11 DIAGNOSIS — Z7901 Long term (current) use of anticoagulants: Secondary | ICD-10-CM

## 2010-08-11 DIAGNOSIS — I4891 Unspecified atrial fibrillation: Secondary | ICD-10-CM

## 2010-08-11 NOTE — Patient Instructions (Addendum)
Continuing taking same dosage of Coumadin- return in 4 weeks.  Agree---------------------- Kathlene November

## 2010-10-20 ENCOUNTER — Ambulatory Visit: Payer: Medicare Other

## 2010-10-20 DIAGNOSIS — I4891 Unspecified atrial fibrillation: Secondary | ICD-10-CM

## 2010-10-20 NOTE — Patient Instructions (Addendum)
INR 1.8 - Coumadin 2.5 mg tab once daily -------------- Usually ok w/ current dose: 1. Recommend compliance 2. No change. 3. Recheck in 2 weeks  [facility]

## 2010-11-09 ENCOUNTER — Telehealth: Payer: Self-pay | Admitting: Internal Medicine

## 2010-11-09 NOTE — Telephone Encounter (Signed)
Please advise patient, he is due for an INR check

## 2010-11-10 ENCOUNTER — Ambulatory Visit (INDEPENDENT_AMBULATORY_CARE_PROVIDER_SITE_OTHER): Payer: Medicare Other | Admitting: *Deleted

## 2010-11-10 DIAGNOSIS — I4891 Unspecified atrial fibrillation: Secondary | ICD-10-CM

## 2010-11-10 DIAGNOSIS — Z7901 Long term (current) use of anticoagulants: Secondary | ICD-10-CM

## 2010-11-10 NOTE — Telephone Encounter (Signed)
Pt advised and states that he will call back to schedule appointment

## 2010-11-10 NOTE — Patient Instructions (Addendum)
Return to office in 4 weeks  Continue current dose: 1 tab daily ----------------- [facility]

## 2010-11-24 LAB — PROTIME-INR
INR: 2.7 — ABNORMAL HIGH
Prothrombin Time: 29.7 — ABNORMAL HIGH

## 2010-11-30 LAB — URINE CULTURE: Colony Count: 45000

## 2010-11-30 LAB — PROTIME-INR: Prothrombin Time: 23.7 — ABNORMAL HIGH

## 2010-11-30 LAB — URINE MICROSCOPIC-ADD ON

## 2010-11-30 LAB — URINALYSIS, ROUTINE W REFLEX MICROSCOPIC
Nitrite: POSITIVE — AB
Specific Gravity, Urine: 1.009
Urobilinogen, UA: 1

## 2010-12-11 ENCOUNTER — Other Ambulatory Visit: Payer: Self-pay | Admitting: Internal Medicine

## 2010-12-12 NOTE — Telephone Encounter (Signed)
Last office 07/22/10 and last filled on 03/31/10 60 x3

## 2010-12-14 ENCOUNTER — Other Ambulatory Visit: Payer: Self-pay | Admitting: Internal Medicine

## 2010-12-15 ENCOUNTER — Other Ambulatory Visit: Payer: Self-pay | Admitting: Internal Medicine

## 2010-12-15 NOTE — Telephone Encounter (Signed)
DUPLICATE REQUEST

## 2010-12-15 NOTE — Telephone Encounter (Signed)
Done 30-day supply. DUE FOR INR/PT CHECK.

## 2010-12-16 NOTE — Telephone Encounter (Signed)
Clonazepam--okay #60 and 3 refills Zofran is not a long-term medication, can't refill

## 2010-12-18 ENCOUNTER — Other Ambulatory Visit: Payer: Self-pay | Admitting: Cardiology

## 2010-12-19 ENCOUNTER — Encounter: Payer: Self-pay | Admitting: *Deleted

## 2010-12-19 ENCOUNTER — Other Ambulatory Visit: Payer: Self-pay | Admitting: Internal Medicine

## 2010-12-19 ENCOUNTER — Ambulatory Visit (INDEPENDENT_AMBULATORY_CARE_PROVIDER_SITE_OTHER): Payer: Medicare Other | Admitting: *Deleted

## 2010-12-19 DIAGNOSIS — Z7901 Long term (current) use of anticoagulants: Secondary | ICD-10-CM

## 2010-12-19 DIAGNOSIS — I4891 Unspecified atrial fibrillation: Secondary | ICD-10-CM

## 2010-12-19 LAB — POCT INR: INR: 2.3

## 2010-12-19 MED ORDER — WARFARIN SODIUM 2.5 MG PO TABS: 2.5000 mg | ORAL_TABLET | Freq: Every day | ORAL | Status: DC

## 2010-12-19 MED ORDER — CLONAZEPAM 1 MG PO TABS: 1.0000 mg | ORAL_TABLET | Freq: Two times a day (BID) | ORAL | Status: DC | PRN

## 2010-12-19 MED ORDER — ONDANSETRON HCL 8 MG PO TABS: 8.0000 mg | ORAL_TABLET | Freq: Three times a day (TID) | ORAL | Status: DC | PRN

## 2010-12-19 MED ORDER — ONDANSETRON HCL 8 MG PO TABS: 8.0000 mg | ORAL_TABLET | Freq: Three times a day (TID) | ORAL | Status: AC | PRN

## 2010-12-19 NOTE — Patient Instructions (Signed)
Pt instructed no change will check in 4 weeks also he will be going for cathter change in the next few weeks.

## 2010-12-19 NOTE — Telephone Encounter (Signed)
I believe the medication was just RF few hours ago if not, ok to call #[phone]

## 2010-12-19 NOTE — Telephone Encounter (Signed)
Last OV 07/22/10 and last refill 03/31/10

## 2010-12-19 NOTE — Telephone Encounter (Signed)
Done

## 2010-12-20 ENCOUNTER — Other Ambulatory Visit: Payer: Self-pay | Admitting: Cardiology

## 2011-01-18 ENCOUNTER — Other Ambulatory Visit: Payer: Self-pay | Admitting: Internal Medicine

## 2011-01-25 ENCOUNTER — Other Ambulatory Visit: Payer: Self-pay | Admitting: Internal Medicine

## 2011-02-14 ENCOUNTER — Other Ambulatory Visit: Payer: Self-pay | Admitting: Internal Medicine

## 2011-02-22 ENCOUNTER — Other Ambulatory Visit: Payer: Self-pay | Admitting: Internal Medicine

## 2011-02-22 NOTE — Telephone Encounter (Signed)
1. He just got some Zofran, if he has nausea he needs to be seen. 2. He is due for an INR and a routine office visit. Please schedule

## 2011-02-22 NOTE — Telephone Encounter (Signed)
Last office 07/22/10, you denied on 12/16/10 although it has been refilled twice since then.  Please advise if you want Ondansetron refilled.  Thanks.

## 2011-02-23 NOTE — Telephone Encounter (Signed)
Notified pt that if he needs refill he will need to be seen to follow up nausea.  He says, "OK, thank you."  Also notified pt he was due for PT re-check.  He again says, "OK, thank you" and hangs up.  Pharmacy notified RX denied--

## 2011-02-25 ENCOUNTER — Emergency Department (HOSPITAL_BASED_OUTPATIENT_CLINIC_OR_DEPARTMENT_OTHER)
Admission: EM | Admit: 2011-02-25 | Discharge: 2011-02-25 | Disposition: A | Discharge status: Home / Self Care | Payer: Medicare Other | Attending: Emergency Medicine | Admitting: Emergency Medicine

## 2011-02-25 ENCOUNTER — Emergency Department (HOSPITAL_BASED_OUTPATIENT_CLINIC_OR_DEPARTMENT_OTHER)
Admission: EM | Admit: 2011-02-25 | Discharge: 2011-02-25 | Discharge status: Against Medical Advice | Payer: Medicare Other | Attending: Emergency Medicine | Admitting: Emergency Medicine

## 2011-02-25 ENCOUNTER — Encounter (HOSPITAL_BASED_OUTPATIENT_CLINIC_OR_DEPARTMENT_OTHER): Payer: Self-pay | Admitting: Emergency Medicine

## 2011-02-25 DIAGNOSIS — R110 Nausea: Secondary | ICD-10-CM | POA: Insufficient documentation

## 2011-02-25 DIAGNOSIS — E785 Hyperlipidemia, unspecified: Secondary | ICD-10-CM | POA: Insufficient documentation

## 2011-02-25 DIAGNOSIS — Z466 Encounter for fitting and adjustment of urinary device: Secondary | ICD-10-CM

## 2011-02-25 DIAGNOSIS — I509 Heart failure, unspecified: Secondary | ICD-10-CM | POA: Insufficient documentation

## 2011-02-25 DIAGNOSIS — R109 Unspecified abdominal pain: Secondary | ICD-10-CM | POA: Insufficient documentation

## 2011-02-25 DIAGNOSIS — I10 Essential (primary) hypertension: Secondary | ICD-10-CM | POA: Insufficient documentation

## 2011-02-25 DIAGNOSIS — Z79899 Other long term (current) drug therapy: Secondary | ICD-10-CM | POA: Insufficient documentation

## 2011-02-25 DIAGNOSIS — N390 Urinary tract infection, site not specified: Secondary | ICD-10-CM | POA: Insufficient documentation

## 2011-02-25 LAB — URINE MICROSCOPIC-ADD ON

## 2011-02-25 LAB — URINALYSIS, ROUTINE W REFLEX MICROSCOPIC
Bilirubin Urine: NEGATIVE
Glucose, UA: NEGATIVE mg/dL
Ketones, ur: NEGATIVE mg/dL
Protein, ur: NEGATIVE mg/dL

## 2011-02-25 MED ORDER — TRAMADOL HCL 50 MG PO TABS: 50.0000 mg | ORAL_TABLET | Freq: Four times a day (QID) | ORAL | Status: DC | PRN

## 2011-02-25 MED ORDER — ONDANSETRON 8 MG PO TBDP
8.0000 mg | ORAL_TABLET | Freq: Once | ORAL | Status: AC
  Administered 2011-02-25: 8 mg via ORAL
  Filled 2011-02-25: qty 1

## 2011-02-25 MED ORDER — TRAMADOL HCL 50 MG PO TABS
50.0000 mg | ORAL_TABLET | Freq: Once | ORAL | Status: AC
  Administered 2011-02-25: 50 mg via ORAL
  Filled 2011-02-25: qty 1

## 2011-02-25 MED ORDER — ONDANSETRON 8 MG PO TBDP: 8.0000 mg | ORAL_TABLET | Freq: Three times a day (TID) | ORAL | Status: AC | PRN

## 2011-02-25 NOTE — ED Notes (Signed)
Sl distended bladder

## 2011-02-25 NOTE — ED Notes (Signed)
Foley Cath changed with sterile technique drained 200cc clear yellow urine with relief of abd pain

## 2011-02-25 NOTE — ED Notes (Signed)
Care plan reviewed with FU and pain controll

## 2011-02-25 NOTE — ED Provider Notes (Signed)
History     CSN: [id number]  Arrival date & time 02/25/11  [id number]   First MD Initiated Contact with Patient 02/25/11 [phone]      Chief Complaint  Patient presents with  . Urinary Retention    urinary retention via cath    (Consider location/radiation/quality/duration/timing/severity/associated sxs/prior treatment) HPI Patient is a 77 year old male who presents with complaint of falling catheter extraction. Patient has history of neurogenic bladder and constantly has indwelling catheter. Is frequently clogged with mucus. Patient denies any fevers. He has lower Donald pain that is consistent with prior episodes of urinary retention secondary to Foley clogging. He has some mild nausea but is also consistent with this. He denies any vomiting or fevers. Patient has no other GI symptoms. There are no other associated or modifying factors.  Past Medical History  Diagnosis Date  . Neurogenic bladder     has had bladder catheter changes at the urology office every 12 weeks  . Anemia   . Hyperlipidemia   . Hypertension   . Heart attack     1983  . CHF (congestive heart failure)   . Arrhythmia     afib  . Warfarin anticoagulation   . GERD (gastroesophageal reflux disease)   . Anxiety   . Renal failure     Past Surgical History  Procedure Date  . Appendectomy   . Cholecystectomy     1971  . Coronary artery bypass graft     1995    Family History  Problem Relation Age of Onset  . Cancer Mother   . Heart attack Father     deceased at 44, smoking, ETOH abuse  . Breast cancer Sister     History  Substance Use Topics  . Smoking status: Former Research scientist (life sciences)  . Smokeless tobacco: Not on file  . Alcohol Use: No      Review of Systems  Constitutional: Negative.   HENT: Negative.   Eyes: Negative.   Respiratory: Negative.   Cardiovascular: Negative.   Gastrointestinal: Positive for nausea and abdominal pain.  Genitourinary:       See history of present illness    Musculoskeletal: Negative.   Skin: Negative.   Neurological: Negative.   Hematological: Negative.   Psychiatric/Behavioral: Negative.   All other systems reviewed and are negative.    Allergies  Advicor; Amoxicillin; Ciprofloxacin; Diltiazem hcl; Hydrocodone-acetaminophen; Niacin; Paroxetine; Propoxyphene n-acetaminophen; Pseudoephedrine; Sulfonamide derivatives; and Tolterodine tartrate  Home Medications   Current Outpatient Rx  Name Route Sig Dispense Refill  . ACETAMINOPHEN 500 MG PO TABS Oral Take 500 mg by mouth every 6 (six) hours as needed.      . ASPIRIN 81 MG PO TABS Oral Take 81 mg by mouth daily.      Marland Kitchen CARVEDILOL 25 MG PO TABS Oral Take 25 mg by mouth 2 (two) times daily with a meal.      . VITAMIN D 1000 UNITS PO CAPS Oral Take 1,000 Units by mouth daily.      Marland Kitchen CLONAZEPAM 1 MG PO TABS Oral Take 1 tablet (1 mg total) by mouth 2 (two) times daily as needed. 30 tablet 1  . DIPHENHYDRAMINE HCL 25 MG PO TABS Oral Take 25 mg by mouth every 6 (six) hours as needed.      . ENALAPRIL MALEATE 20 MG PO TABS  TAKE 1 TABLET BY MOUTH TWICE DAILY 180 tablet 1  . FENOFIBRATE 54 MG PO TABS  TAKE ONE TABLET BY MOUTH TWICE DAILY 180 tablet 0  .  FOLIC ACID 1 MG PO TABS Oral Take 1 mg by mouth daily.      . FUROSEMIDE 40 MG PO TABS  TAKE 2 TABLET BY MOUTH ONCE A DAY 60 tablet 4  . OSTEO BI-FLEX ADV DOUBLE ST PO Oral Take by mouth.      . MULTIVITAMINS PO CAPS Oral Take 1 capsule by mouth daily.      Marland Kitchen OMEPRAZOLE 20 MG PO CPDR Oral Take 20 mg by mouth 2 (two) times daily.      Marland Kitchen ONDANSETRON HCL 8 MG PO TABS  TAKE 1 TABLET BY MOUTH EVERY 8 HOURS AS NEEDED FOR NAUSEA 20 tablet 0  . ONDANSETRON 8 MG PO TBDP Oral Take 1 tablet (8 mg total) by mouth every 8 (eight) hours as needed for nausea. 20 tablet 0  . POTASSIUM CHLORIDE 10 MEQ PO TBCR Oral Take 10 mEq by mouth daily.      Marland Kitchen POTASSIUM CHLORIDE CR 10 MEQ PO CPCR  TAKE ONE CAPSULE BY MOUTH DAILY 90 capsule 2  . VITAMIN B-6 500 MG PO TABS  Oral Take 500 mg by mouth daily.      . TRAMADOL HCL 50 MG PO TABS Oral Take 1 tablet (50 mg total) by mouth every 6 (six) hours as needed for pain. Maximum dose= 8 tablets per day 15 tablet 0  . WARFARIN SODIUM 2 MG PO TABS Oral Take 2 mg by mouth as directed.      . WARFARIN SODIUM 2.5 MG PO TABS  TAKE 1 TABLET BY MOUTH EVERY DAY 30 tablet 0    PATIENT DUE FOR INR/PT CHECK.  . WARFARIN SODIUM 2.5 MG PO TABS Oral Take 1 tablet (2.5 mg total) by mouth daily. 100 tablet 0    BP 114/61  Pulse 80  Temp(Src) 97.9 F (36.6 C) (Oral)  Resp 18  SpO2 95%  Physical Exam  Nursing note and vitals reviewed. Constitutional: He is oriented to person, place, and time. He appears well-developed and well-nourished. No distress.  HENT:  Head: Normocephalic and atraumatic.  Eyes: Conjunctivae and EOM are normal. Pupils are equal, round, and reactive to light.  Neck: Normal range of motion.  Cardiovascular: Normal rate.   Pulmonary/Chest: Effort normal.  Abdominal: Soft. Bowel sounds are normal. He exhibits no distension. There is tenderness. There is no rebound and no guarding.       Mild tenderness to palpation over the suprapubic area  Genitourinary: Penis normal.       Foley catheter in place with leg bag. Clear yellow urine noted in bag  Musculoskeletal: Normal range of motion.  Neurological: He is alert and oriented to person, place, and time. No cranial nerve deficit. He exhibits normal muscle tone. Coordination normal.  Skin: Skin is warm and dry. No rash noted.  Psychiatric: He has a normal mood and affect.    ED Course  Procedures (including critical care time)  Labs Reviewed  URINALYSIS, ROUTINE W REFLEX MICROSCOPIC - Abnormal; Notable for the following:    APPearance CLOUDY (*)    Hgb urine dipstick LARGE (*)    Nitrite POSITIVE (*)    Leukocytes, UA LARGE (*)    All other components within normal limits  URINE MICROSCOPIC-ADD ON - Abnormal; Notable for the following:     Bacteria, UA MANY (*)    All other components within normal limits  URINE CULTURE   No results found.   1. Encounter for replacement of urinary catheter   2. UTI (urinary  tract infection)       MDM  Patient was evaluated by myself. He was here purely for Foley catheter changes he has required previously. Nursing performed this. Patient was given some nausea and pain medication for his symptoms of mild pain and nausea while this was being performed. Patient tolerated the switch well. Urinalysis was ordered by nursing that this had not been requested by myself. I expect the patient normally has contaminated urine 2 to his constant indwelling Foley. This was also taken from previous Foley bag catheter. Patient was not discharged per my orders while we waited for the results. Patient was told that he could wait in the lobby. Following that nurse reviewed results and felt that patient did not have any signs of urinary tract infection. This was in fact incorrect. Patient had already been discharged. Urine culture was sent. Patient was started on Cipro 500 mg by mouth twice a day as he normally takes this for his urinary tract infections. This was called in to pharmacy. Patient was given Cipro as he has numerous allergies. Patient was also discharged prior to this issue with pain and nausea medication. While I did feel this result likely reflected a contaminant antibiotic was prescribed as this test was obtained and reviewed today.      Chauncy Passy, MD 02/25/11 [phone]

## 2011-02-27 ENCOUNTER — Ambulatory Visit (INDEPENDENT_AMBULATORY_CARE_PROVIDER_SITE_OTHER): Payer: Medicare Other

## 2011-02-27 VITALS — BP 130/74 | HR 87 | Temp 98.6°F | Wt 231.4 lb

## 2011-02-27 DIAGNOSIS — Z7901 Long term (current) use of anticoagulants: Secondary | ICD-10-CM

## 2011-02-27 LAB — URINE CULTURE
Colony Count: >=100000
Culture  Setup Time: 201212291656

## 2011-02-27 NOTE — Patient Instructions (Addendum)
INR 2.3 on 11-2010, he was taking Coumadin 17.5 mg weekly. Today INR is 1.5  only taking 2 mg daily, 14 mg weekly. He is on Cipro. Plan: Go back to 17.5 mg weekly----2.5 mg qd  Recheck INR in 10 days JP

## 2011-02-28 ENCOUNTER — Other Ambulatory Visit: Payer: Self-pay | Admitting: Internal Medicine

## 2011-02-28 NOTE — ED Notes (Signed)
+   urine Chart sent to [facility] office for review.

## 2011-03-01 NOTE — Telephone Encounter (Signed)
Last OV 07-23-10, last filled 12-19-10 #30 1

## 2011-03-08 ENCOUNTER — Other Ambulatory Visit: Payer: Self-pay | Admitting: Internal Medicine

## 2011-03-08 NOTE — Telephone Encounter (Signed)
Last OV 07-22-10, last Filled #30 1

## 2011-03-08 NOTE — Telephone Encounter (Signed)
30, 1 refill

## 2011-03-09 MED ORDER — CLONAZEPAM 1 MG PO TABS: 1.0000 mg | ORAL_TABLET | Freq: Two times a day (BID) | ORAL | Status: DC | PRN

## 2011-03-09 NOTE — Telephone Encounter (Signed)
Rx sent 

## 2011-03-10 ENCOUNTER — Telehealth: Payer: Self-pay

## 2011-03-10 NOTE — Telephone Encounter (Signed)
It was prescribed that way before, okay to change

## 2011-03-10 NOTE — Telephone Encounter (Addendum)
Pharmacy called doctor's line and stated patient would like his Clonazepam changed back to the original instruction: one by mouth twice a day and two at bedtime as needed   I reviewed [facility] and the above instructions was  Attached to an rx filled on 03/31/2010. Dr.Paz please advise if ok to change instruction

## 2011-03-13 ENCOUNTER — Ambulatory Visit (INDEPENDENT_AMBULATORY_CARE_PROVIDER_SITE_OTHER): Payer: Medicare Other | Admitting: *Deleted

## 2011-03-13 DIAGNOSIS — I4891 Unspecified atrial fibrillation: Secondary | ICD-10-CM

## 2011-03-13 DIAGNOSIS — Z7901 Long term (current) use of anticoagulants: Secondary | ICD-10-CM

## 2011-03-13 MED ORDER — CLONAZEPAM 1 MG PO TABS: ORAL_TABLET | ORAL | Status: DC

## 2011-03-13 MED ORDER — ONDANSETRON HCL 8 MG PO TABS: 8.0000 mg | ORAL_TABLET | Freq: Two times a day (BID) | ORAL | Status: DC | PRN

## 2011-03-13 NOTE — Telephone Encounter (Signed)
Rx changed and faxed.

## 2011-03-13 NOTE — Patient Instructions (Signed)
Take 2.5 mg by mouth daily. Recheck in [3] weeks per VO Dr Paz//SLS

## 2011-03-24 NOTE — Telephone Encounter (Signed)
What med is he requesting?

## 2011-03-24 NOTE — Telephone Encounter (Signed)
It looks like it was for ondansetron that was done on 03-13-11.

## 2011-03-26 ENCOUNTER — Other Ambulatory Visit: Payer: Self-pay | Admitting: Internal Medicine

## 2011-03-27 NOTE — Telephone Encounter (Signed)
Refill done.  

## 2011-04-11 ENCOUNTER — Encounter: Payer: Self-pay | Admitting: Family Medicine

## 2011-04-11 ENCOUNTER — Ambulatory Visit: Payer: Medicare Other | Admitting: Family Medicine

## 2011-04-11 ENCOUNTER — Ambulatory Visit (INDEPENDENT_AMBULATORY_CARE_PROVIDER_SITE_OTHER): Payer: Medicare Other | Admitting: Family Medicine

## 2011-04-11 VITALS — BP 118/76 | HR 86 | Temp 97.7°F | Wt 225.4 lb

## 2011-04-11 DIAGNOSIS — Z7901 Long term (current) use of anticoagulants: Secondary | ICD-10-CM

## 2011-04-11 DIAGNOSIS — N390 Urinary tract infection, site not specified: Secondary | ICD-10-CM

## 2011-04-11 LAB — POCT URINALYSIS DIPSTICK
Bilirubin, UA: NEGATIVE
Glucose, UA: NEGATIVE
Ketones, UA: NEGATIVE
Nitrite, UA: POSITIVE

## 2011-04-11 LAB — POCT INR: INR: 1.6

## 2011-04-11 MED ORDER — CIPROFLOXACIN HCL 500 MG PO TABS: 500.0000 mg | ORAL_TABLET | Freq: Two times a day (BID) | ORAL | Status: DC

## 2011-04-11 NOTE — Patient Instructions (Signed)

## 2011-04-11 NOTE — Progress Notes (Signed)
  Subjective:    Gary [name] is a 78 y.o. male who complains of abnormal smelling urine, burning with urination, dysuria, frequency and pt has catheter in that is changed every month.  . He has had symptoms for several days. Patient also complains of none. Patient denies back pain, congestion, cough, fever, headache, rhinitis, sorethroat, stomach ache and vaginal discharge. Patient does have a history of recurrent UTI. Patient does not have a history of pyelonephritis.   The following portions of the patient's history were reviewed and updated as appropriate: allergies, current medications, past family history, past medical history, past social history, past surgical history and problem list.  Review of Systems Pertinent items are noted in HPI.    Objective:    BP 118/76  Pulse 86  Temp(Src) 97.7 F (36.5 C) (Oral)  Wt 225 lb 6.4 oz (102.241 kg)  SpO2 99% General appearance: alert, cooperative, appears stated age and no distress Abdomen: soft, non-tender; bowel sounds normal; no masses,  no organomegaly + catheter in place Laboratory:  Urine dipstick: trace for hemoglobin, large for leukocyte esterase, positive for nitrites and trace for protein.   Micro exam: not done.    Assessment:    Acute cystitis and UTI    A fib---  On coumadin recheck 1 week Plan:    Medications: ciprofloxacin. Maintain adequate hydration. Follow up if symptoms not improving, and as needed.

## 2011-04-13 LAB — URINE CULTURE: Colony Count: >=100000

## 2011-04-17 ENCOUNTER — Other Ambulatory Visit: Payer: Self-pay | Admitting: Internal Medicine

## 2011-04-17 NOTE — Telephone Encounter (Signed)
Refill done.  

## 2011-04-18 ENCOUNTER — Encounter: Payer: Self-pay | Admitting: Family Medicine

## 2011-04-18 ENCOUNTER — Ambulatory Visit (INDEPENDENT_AMBULATORY_CARE_PROVIDER_SITE_OTHER): Payer: Medicare Other | Admitting: Family Medicine

## 2011-04-18 VITALS — BP 116/80 | HR 74 | Temp 97.4°F | Wt 229.0 lb

## 2011-04-18 DIAGNOSIS — N390 Urinary tract infection, site not specified: Secondary | ICD-10-CM

## 2011-04-18 DIAGNOSIS — Z7901 Long term (current) use of anticoagulants: Secondary | ICD-10-CM

## 2011-04-18 DIAGNOSIS — R319 Hematuria, unspecified: Secondary | ICD-10-CM

## 2011-04-18 DIAGNOSIS — F419 Anxiety disorder, unspecified: Secondary | ICD-10-CM

## 2011-04-18 DIAGNOSIS — F411 Generalized anxiety disorder: Secondary | ICD-10-CM

## 2011-04-18 DIAGNOSIS — Z23 Encounter for immunization: Secondary | ICD-10-CM

## 2011-04-18 HISTORY — DX: Urinary tract infection, site not specified: N39.0

## 2011-04-18 MED ORDER — LEVOFLOXACIN 500 MG PO TABS: 500.0000 mg | ORAL_TABLET | Freq: Every day | ORAL | Status: DC

## 2011-04-18 NOTE — Patient Instructions (Signed)

## 2011-04-18 NOTE — Progress Notes (Signed)
Addended by: Ewing Schlein on: 04/18/2011 05:28 PM   Modules accepted: Orders

## 2011-04-18 NOTE — Assessment & Plan Note (Addendum)
Switch to levaquin Send to urology to get cath urine

## 2011-04-18 NOTE — Progress Notes (Signed)
  Subjective:    Patient ID: Gary [name], male    DOB: 01/24/1930, 78 y.o.   MRN: [id number]  HPI Pt here f/u UTI -- pt doe not feel any better on cipro----he states levaquin worked better last time.  Pt also wants to switch urologists at [facility].  He did not go into a lot of detail but his wife later said he needs someone who is able to handle a very anxious person.    Review of Systems as above   Objective:   Physical Exam  Constitutional: He is oriented to person, place, and time. He appears well-developed and well-nourished.  Neurological: He is alert and oriented to person, place, and time.  Psychiatric: His behavior is normal. Judgment and thought content normal. His mood appears anxious. His affect is not angry, not blunt, not labile and not inappropriate. He does not exhibit a depressed mood.          Assessment & Plan:

## 2011-04-18 NOTE — Assessment & Plan Note (Signed)
Pt very anxious. Unable to take SSRIs-----  Try Klonopin 1/2 tab during day to see if it helps

## 2011-04-18 NOTE — Assessment & Plan Note (Signed)
Secondary to catheter getting pulled on and causing trauma Hold coumadin until bleeding stops ---he normally does this

## 2011-04-20 ENCOUNTER — Telehealth: Payer: Self-pay | Admitting: Family Medicine

## 2011-04-20 DIAGNOSIS — N390 Urinary tract infection, site not specified: Secondary | ICD-10-CM

## 2011-04-20 NOTE — Telephone Encounter (Signed)
Patient called states he found out that Levaquin does come in generic, and wants as many as possible sent to [facility].  Also, in reference to Urology request to change providers, patient states he will keep his current upcoming 2 appointments with the nurses their, and after those appointments, he will make the decision who he wants to change to.

## 2011-04-20 NOTE — Telephone Encounter (Signed)
Please advise      KP 

## 2011-04-20 NOTE — Telephone Encounter (Signed)
levaquin was already sent the day he was here

## 2011-04-21 ENCOUNTER — Telehealth: Payer: Self-pay | Admitting: Internal Medicine

## 2011-04-21 ENCOUNTER — Ambulatory Visit: Payer: Medicare Other

## 2011-04-21 MED ORDER — LEVOFLOXACIN 500 MG PO TABS: 500.0000 mg | ORAL_TABLET | Freq: Every day | ORAL | Status: AC

## 2011-04-21 NOTE — Telephone Encounter (Signed)
Patient state he would like a refill for 30/each to have on hand for future UTI of Levofloxacin. Can you send a refill for this to [facility]? If this is not the correct procedure please let me know. Thanks

## 2011-04-21 NOTE — Telephone Encounter (Signed)
Schedule a ROV, haven't seen him for aregular visit in > 1 year

## 2011-04-21 NOTE — Telephone Encounter (Signed)
discussed with patient and his Rx was never received by the pharmacy--Rx re-faxed    KP

## 2011-04-21 NOTE — Telephone Encounter (Signed)
I discussed with patient and advised per Dr.Lowne only a quantity of 10 with no refills and he can follow up with Urologist and he voiced understanding.    KP

## 2011-04-21 NOTE — Telephone Encounter (Signed)
OK to refill levofloxacin 500mg  #10 with zero refills? Last refilled on 2.19.13. Please advise.

## 2011-04-21 NOTE — Telephone Encounter (Signed)
I spoke with pt to schedule an OV with Dr. Larose Kells but he stated that his new PCP is Dr. Etter Sjogren. OK to refill?

## 2011-04-26 ENCOUNTER — Ambulatory Visit (INDEPENDENT_AMBULATORY_CARE_PROVIDER_SITE_OTHER): Payer: Medicare Other | Admitting: *Deleted

## 2011-04-26 DIAGNOSIS — N390 Urinary tract infection, site not specified: Secondary | ICD-10-CM

## 2011-04-26 DIAGNOSIS — Z7901 Long term (current) use of anticoagulants: Secondary | ICD-10-CM

## 2011-04-26 DIAGNOSIS — I4891 Unspecified atrial fibrillation: Secondary | ICD-10-CM

## 2011-04-26 LAB — POCT URINALYSIS DIPSTICK
Glucose, UA: NEGATIVE
Ketones, UA: NEGATIVE
Spec Grav, UA: 1.01

## 2011-04-26 NOTE — Progress Notes (Signed)
Addended by: Modena Morrow D on: 04/26/2011 01:29 PM   Modules accepted: Orders

## 2011-04-26 NOTE — Patient Instructions (Signed)
Off coumadin x 1 week Plan: Go back to regular dose RTC 10 days, needs a ROV-------------------> 30 minutes not just coumadin check JP

## 2011-04-28 LAB — URINE CULTURE: Colony Count: >=100000

## 2011-05-04 ENCOUNTER — Telehealth: Payer: Self-pay | Admitting: *Deleted

## 2011-05-04 ENCOUNTER — Other Ambulatory Visit: Payer: Self-pay | Admitting: Internal Medicine

## 2011-05-04 MED ORDER — CLONAZEPAM 1 MG PO TABS: 1.0000 mg | ORAL_TABLET | Freq: Two times a day (BID) | ORAL | Status: DC | PRN

## 2011-05-04 NOTE — Telephone Encounter (Signed)
Refill done.  

## 2011-05-04 NOTE — Telephone Encounter (Signed)
Only #30, needs OV before RF

## 2011-05-04 NOTE — Telephone Encounter (Signed)
Refill request for Klonolin 1mg  #30 with 1 refill. Last refilled on 10.22.12. OK to refill?

## 2011-05-08 ENCOUNTER — Ambulatory Visit (INDEPENDENT_AMBULATORY_CARE_PROVIDER_SITE_OTHER): Payer: Medicare Other | Admitting: *Deleted

## 2011-05-08 VITALS — BP 120/62 | HR 67 | Temp 97.3°F | Wt 228.0 lb

## 2011-05-08 DIAGNOSIS — N390 Urinary tract infection, site not specified: Secondary | ICD-10-CM

## 2011-05-08 DIAGNOSIS — I4891 Unspecified atrial fibrillation: Secondary | ICD-10-CM

## 2011-05-08 DIAGNOSIS — Z7901 Long term (current) use of anticoagulants: Secondary | ICD-10-CM

## 2011-05-08 LAB — POCT URINALYSIS DIPSTICK
Bilirubin, UA: NEGATIVE
Nitrite, UA: POSITIVE
Protein, UA: NEGATIVE
pH, UA: 5

## 2011-05-08 LAB — POCT INR: INR: 2.2

## 2011-05-08 NOTE — Patient Instructions (Signed)
Continue with same coumadin, recheck in 2 weeks If you desire me to be your doctor, schedule a office visit in 2 weeks, 30 minutes appointment

## 2011-05-08 NOTE — Progress Notes (Signed)
  Subjective:    Patient ID: Gary [name], male    DOB: [birth year], 78 y.o.   MRN: [id number]  HPI Here for a coumadin check, did not schedule a ROV    Review of Systems On Coumadin 2.5 mg daily    Objective:   Physical Exam  Alert oriented, no apparent distress      Assessment & Plan:  Patient here for a Coumadin check only, I talked to the patient today, I told him that if he likes me to be his PCP, he needs a routine visit not just Coumadin checks. There is a number of things I need to review and  check for me to do a good job and I can't  do that if he only came back for  Coumadin checks. I spent more than 10 minutes with the patient trying to explain that to him, he has a hard time understanding the concept. Nevertheless, I recommend him no change on coumadin, office visit in 2 weeks

## 2011-05-11 ENCOUNTER — Other Ambulatory Visit: Payer: Self-pay | Admitting: Family Medicine

## 2011-05-11 NOTE — Telephone Encounter (Signed)
Seen and filled 04/18/11 # 10. Please advise     KP

## 2011-05-11 NOTE — Telephone Encounter (Signed)
We gave him quite a bit of levaquin--he shouldn't need more

## 2011-05-12 LAB — CULTURE, URINE COMPREHENSIVE

## 2011-05-12 MED ORDER — NITROFURANTOIN MACROCRYSTAL 100 MG PO CAPS: 100.0000 mg | ORAL_CAPSULE | Freq: Four times a day (QID) | ORAL | Status: AC

## 2011-05-12 MED ORDER — LEVOFLOXACIN 500 MG PO TABS: 500.0000 mg | ORAL_TABLET | Freq: Every day | ORAL | Status: AC

## 2011-05-12 NOTE — Progress Notes (Signed)
Addended by: Douglass Rivers T on: 05/12/2011 06:01 PM   Modules accepted: Orders

## 2011-05-23 ENCOUNTER — Ambulatory Visit: Payer: Medicare Other | Admitting: Internal Medicine

## 2011-05-24 ENCOUNTER — Ambulatory Visit (INDEPENDENT_AMBULATORY_CARE_PROVIDER_SITE_OTHER): Payer: Medicare Other | Admitting: *Deleted

## 2011-05-24 VITALS — BP 120/76 | HR 67 | Temp 97.6°F | Wt 223.0 lb

## 2011-05-24 DIAGNOSIS — Z7901 Long term (current) use of anticoagulants: Secondary | ICD-10-CM

## 2011-05-24 DIAGNOSIS — I4891 Unspecified atrial fibrillation: Secondary | ICD-10-CM

## 2011-05-24 NOTE — Patient Instructions (Addendum)
Return to office in 4 weeks  Continue current dose: Take 2.5 mg by mouth daily  Patient will need to follow-up with urologist in regard to recurrent UTI with catheter changes

## 2011-05-29 ENCOUNTER — Ambulatory Visit: Payer: Medicare Other | Admitting: Family Medicine

## 2011-06-04 ENCOUNTER — Other Ambulatory Visit: Payer: Self-pay | Admitting: Internal Medicine

## 2011-06-06 ENCOUNTER — Other Ambulatory Visit: Payer: Self-pay

## 2011-06-06 MED ORDER — FUROSEMIDE 40 MG PO TABS: 80.0000 mg | ORAL_TABLET | Freq: Every day | ORAL | Status: DC

## 2011-06-08 ENCOUNTER — Ambulatory Visit: Payer: Medicare Other

## 2011-06-14 ENCOUNTER — Other Ambulatory Visit: Payer: Self-pay | Admitting: Cardiology

## 2011-06-21 ENCOUNTER — Ambulatory Visit (INDEPENDENT_AMBULATORY_CARE_PROVIDER_SITE_OTHER): Payer: Medicare Other

## 2011-06-21 VITALS — BP 110/68 | HR 63 | Temp 97.6°F | Wt 226.6 lb

## 2011-06-21 DIAGNOSIS — I4891 Unspecified atrial fibrillation: Secondary | ICD-10-CM

## 2011-06-21 DIAGNOSIS — Z7901 Long term (current) use of anticoagulants: Secondary | ICD-10-CM

## 2011-06-21 LAB — POCT INR: INR: 2.8

## 2011-06-21 NOTE — Patient Instructions (Signed)
INR Good today. According to Dr.Paz it is ok to continue with your current dose of 2.5mg  daily and recheck in 4 weeks.

## 2011-06-23 ENCOUNTER — Other Ambulatory Visit: Payer: Self-pay | Admitting: Family Medicine

## 2011-07-06 ENCOUNTER — Other Ambulatory Visit: Payer: Self-pay | Admitting: Family Medicine

## 2011-07-09 ENCOUNTER — Other Ambulatory Visit: Payer: Self-pay | Admitting: Internal Medicine

## 2011-07-10 NOTE — Telephone Encounter (Signed)
Refill done.  

## 2011-07-11 ENCOUNTER — Telehealth: Payer: Self-pay

## 2011-07-11 ENCOUNTER — Encounter: Payer: Self-pay | Admitting: Family Medicine

## 2011-07-11 ENCOUNTER — Ambulatory Visit (INDEPENDENT_AMBULATORY_CARE_PROVIDER_SITE_OTHER): Payer: Medicare Other | Admitting: Family Medicine

## 2011-07-11 VITALS — BP 108/66 | HR 56 | Temp 97.8°F | Ht 73.0 in | Wt 230.2 lb

## 2011-07-11 DIAGNOSIS — R5383 Other fatigue: Secondary | ICD-10-CM

## 2011-07-11 DIAGNOSIS — R112 Nausea with vomiting, unspecified: Secondary | ICD-10-CM

## 2011-07-11 DIAGNOSIS — I4891 Unspecified atrial fibrillation: Secondary | ICD-10-CM

## 2011-07-11 DIAGNOSIS — Z1211 Encounter for screening for malignant neoplasm of colon: Secondary | ICD-10-CM

## 2011-07-11 DIAGNOSIS — R5381 Other malaise: Secondary | ICD-10-CM

## 2011-07-11 LAB — CBC WITH DIFFERENTIAL/PLATELET
Basophils Absolute: 0 10*3/uL (ref 0.0–0.1)
Hemoglobin: 11.5 g/dL — ABNORMAL LOW (ref 13.0–17.0)
Lymphocytes Relative: 35 % (ref 12.0–46.0)
Monocytes Relative: 7.9 % (ref 3.0–12.0)
Neutro Abs: 1.6 10*3/uL (ref 1.4–7.7)
Platelets: 148 10*3/uL — ABNORMAL LOW (ref 150.0–400.0)
RDW: 14.5 % (ref 11.5–14.6)

## 2011-07-11 LAB — POCT INR: INR: 3.4

## 2011-07-11 MED ORDER — ONDANSETRON HCL 8 MG PO TABS: 8.0000 mg | ORAL_TABLET | Freq: Two times a day (BID) | ORAL | Status: DC | PRN

## 2011-07-11 NOTE — Progress Notes (Signed)
  Subjective:     Gary [name] is a 78 y.o. male who presents for evaluation of bilious vomiting 10 times per day and cramping pain located in in the lower abdomen. Symptoms have been present for 4 days. Patient denies blood in stool, constipation, dark urine, dysuria, fever, heartburn, hematemesis, hematuria and melena. Patient's oral intake has been normal. Patient's urine output has been adequate. Other contacts with similar symptoms include: none. Patient denies recent travel history. Patient has had recent ingestion of possible contaminated food, toxic plants, or inappropriate medications/poisons.   The following portions of the patient's history were reviewed and updated as appropriate: allergies, current medications, past family history, past medical history, past social history, past surgical history and problem list.  Review of Systems Pertinent items are noted in HPI.    Objective:     BP 108/66  Pulse 56  Temp(Src) 97.8 F (36.6 C) (Oral)  Ht 6\' 1"  (1.854 m)  Wt 230 lb 3.2 oz (104.418 kg)  BMI 30.37 kg/m2  SpO2 98% General appearance: alert, cooperative, appears stated age and no distress Throat: lips, mucosa, and tongue normal; teeth and gums normal Neck: no adenopathy, supple, symmetrical, trachea midline and thyroid not enlarged, symmetric, no tenderness/mass/nodules Lungs: clear to auscultation bilaterally Heart: S1, S2 normal Abdomen: soft, non-tender; bowel sounds normal; no masses,  no organomegaly    Assessment:    Acute Gastroenteritis   fatigue-- prob from #1--  Check labs A fib-- check PT Plan:    1. Discussed oral rehydration, reintroduction of solid foods, signs of dehydration. 2. Return or go to emergency department if worsening symptoms, blood or bile, signs of dehydration, diarrhea lasting longer than 5 days or any new concerns. 3. Follow up in 1 month for pt check or sooner as needed.  Face to face encounter--  25 min

## 2011-07-11 NOTE — Telephone Encounter (Signed)
Spoke with patient, patient verbalized understanding of labs. Updated med list to reflect patient taking iron 2 daily. Labs and Ifob mailed

## 2011-07-11 NOTE — Telephone Encounter (Signed)
Message copied by Logan Bores on Tue Jul 11, 2011  4:02 PM ------      Message from: Rosalita Chessman      Created: Tue Jul 11, 2011  2:36 PM       Pt is anemic---take 2 irons a day and we will recheck labs in 1 month----also have pt do ifob

## 2011-07-11 NOTE — Patient Instructions (Signed)
Salmonella Gastroenteritis, Adult Salmonella gastroenteritis is an infection that is caused by bacteria.  CAUSES  Salmonella gastroenteritis usually occurs after eating meat, eggs, dairy products, or poultry that is contaminated with the bacteria.  SYMPTOMS  Typical symptoms of this infection include:   Diarrhea.   Belly (abdominal) cramps.   Fever.   Feeling sick to your stomach (nausea) and occasional vomiting.  Symptoms of salmonella gastroenteritis usually start 6 to 48 hours after eating the infected food. This is usually mild and lasts 1 to 7 days.  TREATMENT  Antibiotic medicines usually will not shorten the course of the illness or improve symptoms. Antibiotics are occasionally prescribed for severe infections in older adults, adults with immune diseases such as AIDS, or adults on chemotherapy. Home treatment is all that is usually required for symptom resolution.  PREVENTION  To prevent future infections with these bacteria:  Handle meat, eggs, dairy products, and poultry properly.   Wash hands and counters thoroughly after handling or preparing meat, eggs, dairy products, and poultry.   Always cook meat, eggs, dairy products, and poultry thoroughly.  HOME CARE INSTRUCTIONS  Only take over-the-counter or prescription medicines for pain, discomfort, or fever as directed by your caregiver.   If you have an appetite, eat a normal diet unless your caregiver tells you differently.   Eat a variety of complex carbohydrates (rice, wheat, potatoes, bread), lean meats, yogurt, fruits, and vegetables.   Avoid high-fat foods because they are more difficult to digest.   If you do not have an appetite, do not force yourself to eat.   You need to stay well hydrated. Drink frequently but in small amounts. Drink enough water and fluids to keep your urine clear or pale yellow. Until your diarrhea, nausea, or vomiting is under control, you should drink clear liquids only. Clear liquids are  anything you can see through such as water, broth, or non-caffeinated tea. Avoid:   Milk.   Fruit juice.   Alcohol.   Extremely hot or cold fluids.   Adults who are older or who have a chronic illness may rapidly become dehydrated if diarrhea continues along with vomiting. Therefore, medicine may be taken to control the nausea if present, either in an oral or a suppository form.   If you are dehydrated, ask your caregiver for specific rehydration instructions. Signs of dehydration may include:   Severe thirst.   Dry lips and mouth.   Dizziness.   Dark urine.   Decreasing urine frequency and amount.   Confusion.   Rapid breathing or pulse.   Take your antibiotics as directed. Finish them even if you start to feel better.   Anti-diarrheal medicines are not recommended.   It is important that you keep all follow-up appointments. Be sure to tell your caregiver if your symptoms continue or return.  SEEK IMMEDIATE MEDICAL CARE IF:   You are unable to keep fluids down.   Vomiting or diarrhea becomes persistent.   Abdominal pain develops, increases, or localizes.   Diarrhea becomes excessive or contains blood or mucus.   You experience excessive weakness, dizziness, fainting, or extreme thirst.   You experience significant weight loss. Your caregiver will tell you what loss should concern you or suggest another visit for follow-up.   You have a fever.  MAKE SURE YOU:   Understand these instructions.   Will watch your condition.   Will get help right away if you are not doing well or get worse.  Document Released: 02/11/2000 Document  Revised: 02/02/2011 Document Reviewed: 05/18/2008 [facility] Patient Information 2012 [place].

## 2011-07-12 ENCOUNTER — Other Ambulatory Visit: Payer: Self-pay | Admitting: Internal Medicine

## 2011-07-12 NOTE — Telephone Encounter (Signed)
Clonazepam 1mg  #30 with zero refills. Last refilled on 3.7.13. OK to refill?

## 2011-07-17 ENCOUNTER — Other Ambulatory Visit: Payer: Self-pay | Admitting: Family Medicine

## 2011-07-17 NOTE — Telephone Encounter (Signed)
Last seen 07/11/11 and filled 05/04/11 #30 with 1 refill. Please advise    KP

## 2011-08-04 NOTE — Telephone Encounter (Signed)
To Dr Etter Sjogren

## 2011-08-04 NOTE — Telephone Encounter (Signed)
Refill 6 months 

## 2011-08-04 NOTE — Telephone Encounter (Signed)
Last seen 07/11/11 and filled 07/17/11 # 30. please advise   KP

## 2011-08-04 NOTE — Telephone Encounter (Signed)
I think he sees Dr.Lowne now

## 2011-08-11 ENCOUNTER — Other Ambulatory Visit (INDEPENDENT_AMBULATORY_CARE_PROVIDER_SITE_OTHER): Payer: Medicare Other

## 2011-08-11 ENCOUNTER — Ambulatory Visit (INDEPENDENT_AMBULATORY_CARE_PROVIDER_SITE_OTHER): Payer: Medicare Other

## 2011-08-11 ENCOUNTER — Other Ambulatory Visit: Payer: Medicare Other

## 2011-08-11 VITALS — BP 114/66 | HR 67 | Temp 98.0°F | Wt 226.0 lb

## 2011-08-11 DIAGNOSIS — I4891 Unspecified atrial fibrillation: Secondary | ICD-10-CM

## 2011-08-11 DIAGNOSIS — Z7901 Long term (current) use of anticoagulants: Secondary | ICD-10-CM

## 2011-08-11 LAB — CBC WITH DIFFERENTIAL/PLATELET
Basophils Relative: 0.4 % (ref 0.0–3.0)
Eosinophils Relative: 1.4 % (ref 0.0–5.0)
Hemoglobin: 11.6 g/dL — ABNORMAL LOW (ref 13.0–17.0)
Lymphocytes Relative: 30.2 % (ref 12.0–46.0)
Monocytes Relative: 7.8 % (ref 3.0–12.0)
Neutro Abs: 1.9 10*3/uL (ref 1.4–7.7)
RBC: 4.37 Mil/uL (ref 4.22–5.81)
WBC: 3.2 10*3/uL — ABNORMAL LOW (ref 4.5–10.5)

## 2011-08-11 LAB — POCT INR: INR: 2.4

## 2011-08-11 NOTE — Patient Instructions (Addendum)
INR was great continue current dose and recheck in 1 month-- KP

## 2011-08-11 NOTE — Progress Notes (Signed)
Labs only

## 2011-08-18 ENCOUNTER — Other Ambulatory Visit: Payer: Self-pay | Admitting: Family Medicine

## 2011-08-18 NOTE — Telephone Encounter (Signed)
Last seen 07/11/11 and filled 07/17/11 #30. Please advise    KP

## 2011-08-18 NOTE — Telephone Encounter (Signed)
Ok to refill x 1  

## 2011-08-18 NOTE — Telephone Encounter (Signed)
Last OV 07-11-11 last 2 refills noted 07-12-11 for #30 no refills and 07-17-11 #30 no refills

## 2011-09-06 ENCOUNTER — Other Ambulatory Visit: Payer: Medicare Other

## 2011-09-07 ENCOUNTER — Ambulatory Visit: Payer: Medicare Other

## 2011-09-07 ENCOUNTER — Ambulatory Visit (INDEPENDENT_AMBULATORY_CARE_PROVIDER_SITE_OTHER): Payer: Medicare Other | Admitting: *Deleted

## 2011-09-07 VITALS — BP 120/70 | Temp 97.5°F | Wt 225.0 lb

## 2011-09-07 DIAGNOSIS — Z7901 Long term (current) use of anticoagulants: Secondary | ICD-10-CM

## 2011-09-07 DIAGNOSIS — I4891 Unspecified atrial fibrillation: Secondary | ICD-10-CM

## 2011-09-07 LAB — POCT INR: INR: 1.7

## 2011-09-07 NOTE — Patient Instructions (Addendum)
INR today 1.7  Return to office in 1 week  New dosing: 2 tab today then 1 tab daily except 2 tabs on Mondays  Pt advise of med changes but states that he is suppose to get his catheter changed tomorrow and the last time he increase med he bleed out and had to be hospitalized. Pt also states that he is not going to continue with increase in green vegetables in his diet. All info given to PCP verbally.

## 2011-09-11 ENCOUNTER — Ambulatory Visit: Payer: Medicare Other

## 2011-09-14 ENCOUNTER — Ambulatory Visit (INDEPENDENT_AMBULATORY_CARE_PROVIDER_SITE_OTHER): Payer: Medicare Other | Admitting: *Deleted

## 2011-09-14 VITALS — BP 100/68 | HR 57 | Wt 226.0 lb

## 2011-09-14 DIAGNOSIS — Z7901 Long term (current) use of anticoagulants: Secondary | ICD-10-CM

## 2011-09-14 DIAGNOSIS — I4891 Unspecified atrial fibrillation: Secondary | ICD-10-CM

## 2011-09-14 NOTE — Patient Instructions (Signed)
Return to office in 4 weeks  Continue current dose: Take 2.5 mg  daily

## 2011-10-08 ENCOUNTER — Other Ambulatory Visit: Payer: Self-pay | Admitting: Family Medicine

## 2011-10-08 ENCOUNTER — Other Ambulatory Visit: Payer: Self-pay | Admitting: Internal Medicine

## 2011-10-12 ENCOUNTER — Other Ambulatory Visit: Payer: Self-pay | Admitting: Family Medicine

## 2011-10-12 NOTE — Telephone Encounter (Signed)
Last 04/10/11 and filled 09/12/11 # 30. Please advise     KP

## 2011-10-18 ENCOUNTER — Telehealth: Payer: Self-pay | Admitting: Family Medicine

## 2011-10-18 DIAGNOSIS — I10 Essential (primary) hypertension: Secondary | ICD-10-CM

## 2011-10-18 DIAGNOSIS — R319 Hematuria, unspecified: Secondary | ICD-10-CM

## 2011-10-18 DIAGNOSIS — E785 Hyperlipidemia, unspecified: Secondary | ICD-10-CM

## 2011-10-18 DIAGNOSIS — Z125 Encounter for screening for malignant neoplasm of prostate: Secondary | ICD-10-CM

## 2011-10-18 NOTE — Telephone Encounter (Signed)
Cbcd, hep, lipid, bmp, psa,  UA----272.4, 401.9,   Also use prostate cancer screening code

## 2011-10-18 NOTE — Telephone Encounter (Signed)
Patient switched to you and has not had a CPE or Labs one in more than a year, he is coming for a repeat CBC and scheduled a CPE for next year, would you like for him to have any labs done or just have the repeat CBC-D. Please advise    KP

## 2011-10-18 NOTE — Telephone Encounter (Signed)
Pt called to make PT/inr check & he is extremely past due was last done 7.5.13 coming Friday 8.23.13 @ 330PM.  He also made his CPE for next yr in May   Also pt would like to have his 94-month lab visit on Friday from his June labs & his iron rechecked Pt wanted to know if these would be fastin labs  Below is copied from chart labs  Notes Recorded by Rosalita Chessman, DO on 08/14/2011 at 9:57 PM Improved---- but wbc still low --repeat 3 months. wbc 288.8 Cb# [id number]

## 2011-10-19 NOTE — Telephone Encounter (Signed)
He will need to be fasting---Orders are in the system      KP

## 2011-10-19 NOTE — Telephone Encounter (Signed)
Called pt lmom to call me back to approve a 9am visit

## 2011-10-19 NOTE — Telephone Encounter (Signed)
Spoke wt/patient made appt for tomorrow

## 2011-10-20 ENCOUNTER — Other Ambulatory Visit (INDEPENDENT_AMBULATORY_CARE_PROVIDER_SITE_OTHER): Payer: Medicare Other

## 2011-10-20 ENCOUNTER — Ambulatory Visit: Payer: Medicare Other

## 2011-10-20 DIAGNOSIS — E785 Hyperlipidemia, unspecified: Secondary | ICD-10-CM

## 2011-10-20 DIAGNOSIS — I10 Essential (primary) hypertension: Secondary | ICD-10-CM

## 2011-10-20 DIAGNOSIS — Z7901 Long term (current) use of anticoagulants: Secondary | ICD-10-CM

## 2011-10-20 DIAGNOSIS — R319 Hematuria, unspecified: Secondary | ICD-10-CM

## 2011-10-20 DIAGNOSIS — Z125 Encounter for screening for malignant neoplasm of prostate: Secondary | ICD-10-CM

## 2011-10-20 DIAGNOSIS — Z48816 Encounter for surgical aftercare following surgery on the genitourinary system: Secondary | ICD-10-CM

## 2011-10-20 LAB — URINALYSIS, ROUTINE W REFLEX MICROSCOPIC
Nitrite: POSITIVE
Total Protein, Urine: 30
pH: 7.5 (ref 5.0–8.0)

## 2011-10-20 LAB — BASIC METABOLIC PANEL
BUN: 53 mg/dL — ABNORMAL HIGH (ref 6–23)
CO2: 31 mEq/L (ref 19–32)
Chloride: 101 mEq/L (ref 96–112)
Glucose, Bld: 86 mg/dL (ref 70–99)
Potassium: 4.6 mEq/L (ref 3.5–5.1)

## 2011-10-20 LAB — HEPATIC FUNCTION PANEL
ALT: 13 U/L (ref 0–53)
AST: 19 U/L (ref 0–37)
Bilirubin, Direct: 0.2 mg/dL (ref 0.0–0.3)
Total Protein: 6.5 g/dL (ref 6.0–8.3)

## 2011-10-20 LAB — LIPID PANEL
Cholesterol: 95 mg/dL (ref 0–200)
Total CHOL/HDL Ratio: 4
Triglycerides: 81 mg/dL (ref 0.0–149.0)

## 2011-10-20 LAB — CBC WITH DIFFERENTIAL/PLATELET
Basophils Relative: 0.2 % (ref 0.0–3.0)
Eosinophils Relative: 1.4 % (ref 0.0–5.0)
HCT: 37.5 % — ABNORMAL LOW (ref 39.0–52.0)
Hemoglobin: 12 g/dL — ABNORMAL LOW (ref 13.0–17.0)
Lymphs Abs: 1 10*3/uL (ref 0.7–4.0)
MCV: 83.8 fl (ref 78.0–100.0)
Monocytes Absolute: 0.3 10*3/uL (ref 0.1–1.0)
Monocytes Relative: 8.7 % (ref 3.0–12.0)
RBC: 4.47 Mil/uL (ref 4.22–5.81)
WBC: 3.4 10*3/uL — ABNORMAL LOW (ref 4.5–10.5)

## 2011-10-20 LAB — PROTIME-INR: INR: 2.6 ratio — ABNORMAL HIGH (ref 0.8–1.0)

## 2011-11-14 ENCOUNTER — Telehealth: Payer: Self-pay

## 2011-11-14 DIAGNOSIS — N19 Unspecified kidney failure: Secondary | ICD-10-CM

## 2011-11-14 DIAGNOSIS — R7989 Other specified abnormal findings of blood chemistry: Secondary | ICD-10-CM

## 2011-11-14 DIAGNOSIS — R799 Abnormal finding of blood chemistry, unspecified: Secondary | ICD-10-CM

## 2011-11-14 NOTE — Telephone Encounter (Signed)
Message copied by Ewing Schlein on Tue Nov 14, 2011  8:31 AM ------      Message from: Rosalita Chessman      Created: Sun Oct 22, 2011  9:01 PM       Fax labs to urology      Bun/cr is elevated--- refer to nephrology

## 2011-11-14 NOTE — Progress Notes (Signed)
No return call---letter mailed     KP

## 2011-11-17 ENCOUNTER — Telehealth: Payer: Self-pay

## 2011-11-17 NOTE — Telephone Encounter (Signed)
Pt received results letter in the mail.  He does not wish to see a nephrologist.  He states he has had this kidney problem for over 30 years and he is 78 years old and is not going to see another doctor.  Prefers to be contacted on home phone for future reference.

## 2011-11-17 NOTE — Telephone Encounter (Signed)
Noted-- pt refused nephrology referral

## 2011-11-17 NOTE — Telephone Encounter (Signed)
Gary Koyanagi, DO 11/17/2011 1:18 PM Signed  Noted-- pt refused nephrology referral [place], [place], [place] 11/17/2011 10:04 AM Signed  Pt received results letter in the mail. He does not wish to see a nephrologist. He states he has had this kidney problem for over 30 years and he is 78 years old and is not going to see another doctor. Prefers to be contacted on home phone for future reference.

## 2011-11-21 ENCOUNTER — Other Ambulatory Visit: Payer: Self-pay | Admitting: Family Medicine

## 2011-11-21 DIAGNOSIS — K219 Gastro-esophageal reflux disease without esophagitis: Secondary | ICD-10-CM

## 2011-11-21 NOTE — Telephone Encounter (Signed)
Rx sent.    MW 

## 2011-11-23 ENCOUNTER — Ambulatory Visit (INDEPENDENT_AMBULATORY_CARE_PROVIDER_SITE_OTHER): Payer: Medicare Other

## 2011-11-23 ENCOUNTER — Other Ambulatory Visit: Payer: Self-pay | Admitting: Family Medicine

## 2011-11-23 VITALS — BP 110/70 | HR 64 | Temp 97.9°F | Wt 219.0 lb

## 2011-11-23 DIAGNOSIS — Z792 Long term (current) use of antibiotics: Secondary | ICD-10-CM

## 2011-11-23 DIAGNOSIS — Z7901 Long term (current) use of anticoagulants: Secondary | ICD-10-CM

## 2011-11-23 DIAGNOSIS — I4891 Unspecified atrial fibrillation: Secondary | ICD-10-CM

## 2011-11-23 NOTE — Patient Instructions (Addendum)
INR was 2.6. Continue to take the current dose of 2.5 mg daily and recheck in 4 weeks

## 2011-11-23 NOTE — Telephone Encounter (Signed)
Rx sent.    MW 

## 2011-12-08 ENCOUNTER — Other Ambulatory Visit: Payer: Self-pay | Admitting: Family Medicine

## 2011-12-08 ENCOUNTER — Other Ambulatory Visit: Payer: Self-pay | Admitting: Cardiology

## 2011-12-08 NOTE — Telephone Encounter (Signed)
Rx sent.    MW 

## 2011-12-13 ENCOUNTER — Other Ambulatory Visit: Payer: Self-pay | Admitting: Family Medicine

## 2011-12-13 ENCOUNTER — Other Ambulatory Visit: Payer: Self-pay | Admitting: Cardiology

## 2012-01-04 ENCOUNTER — Other Ambulatory Visit: Payer: Self-pay | Admitting: Family Medicine

## 2012-01-04 NOTE — Telephone Encounter (Signed)
Last seen 07/11/11 and filled 11/21/11. Please advise     KP

## 2012-01-07 ENCOUNTER — Other Ambulatory Visit: Payer: Self-pay | Admitting: Family Medicine

## 2012-02-06 ENCOUNTER — Telehealth: Payer: Self-pay | Admitting: Family Medicine

## 2012-02-06 NOTE — Telephone Encounter (Signed)
In reference to Nephrology referral entered back in September-2013, per call today from [facility] with [place], patient would not schedule.  Per Pam, prior to today they have been trying to reach patient to schedule, have left him voicemails with no return call.  Today when they reached him, patient stated he knew nothing about being referred to Nephrology and why, and wanted to discuss this with Dr. Etter Sjogren.

## 2012-02-08 NOTE — Telephone Encounter (Signed)
The referral was put in before the patient was made aware of results because it took him a few days to call back. Once he was notified by Jonelle Sidle that he did not want the referral it was not cancelled. I will make the patient aware. Msg left to call the office     KP

## 2012-02-12 ENCOUNTER — Ambulatory Visit (INDEPENDENT_AMBULATORY_CARE_PROVIDER_SITE_OTHER): Payer: Medicare Other | Admitting: Physician Assistant

## 2012-02-12 ENCOUNTER — Encounter: Payer: Self-pay | Admitting: Physician Assistant

## 2012-02-12 ENCOUNTER — Telehealth: Payer: Self-pay | Admitting: Cardiology

## 2012-02-12 VITALS — BP 100/54 | HR 69 | Ht 73.0 in | Wt 235.6 lb

## 2012-02-12 DIAGNOSIS — R188 Other ascites: Secondary | ICD-10-CM

## 2012-02-12 DIAGNOSIS — R609 Edema, unspecified: Secondary | ICD-10-CM

## 2012-02-12 DIAGNOSIS — E785 Hyperlipidemia, unspecified: Secondary | ICD-10-CM

## 2012-02-12 DIAGNOSIS — I5023 Acute on chronic systolic (congestive) heart failure: Secondary | ICD-10-CM

## 2012-02-12 DIAGNOSIS — I2589 Other forms of chronic ischemic heart disease: Secondary | ICD-10-CM

## 2012-02-12 DIAGNOSIS — I4891 Unspecified atrial fibrillation: Secondary | ICD-10-CM

## 2012-02-12 DIAGNOSIS — I10 Essential (primary) hypertension: Secondary | ICD-10-CM

## 2012-02-12 LAB — BASIC METABOLIC PANEL
Calcium: 9.9 mg/dL (ref 8.4–10.5)
Creatinine, Ser: 2.2 mg/dL — ABNORMAL HIGH (ref 0.4–1.5)
GFR: 30.22 mL/min — ABNORMAL LOW (ref >60.00)

## 2012-02-12 LAB — HEPATIC FUNCTION PANEL
Bilirubin, Direct: 0.3 mg/dL (ref 0.0–0.3)
Total Bilirubin: 1.4 mg/dL — ABNORMAL HIGH (ref 0.3–1.2)
Total Protein: 7.2 g/dL (ref 6.0–8.3)

## 2012-02-12 MED ORDER — FUROSEMIDE 40 MG PO TABS: 80.0000 mg | ORAL_TABLET | Freq: Two times a day (BID) | ORAL | Status: DC

## 2012-02-12 NOTE — Telephone Encounter (Signed)
I spoke with the pt's wife and the pt has had progressive symptoms over the past 2 months.  The pt saw his Urologist 2 months ago due to bladder spasms and was started on a new medication. The pt stopped this medication 2 weeks ago due to side effects (pt's wife unsure of med name at this time). The pt's feet are very swollen, the pt is lethargic, no appetite, coughing and having difficulty walking.  The pt does not have SOB. The pt's wife would like the pt seen today due to swelling and history of CHF. The pt has not seen his PCP for other symptoms. I have scheduled the pt to be seen today.

## 2012-02-12 NOTE — Telephone Encounter (Signed)
Left message to call back.  I have placed the pt on Scott Weaver's today at 11:50.

## 2012-02-12 NOTE — Patient Instructions (Addendum)
INCREASE LASIX TO 80 MG TWICE DAILY; NEW RX SENT IN TODAY  LABS TODAY; BMET, LFT'S, PT/INR  FOLLOW UP WITH SCOTT WEAVER, [facility] 02/19/12 @ 3:40; WITH REPEAT BMET 02/19/12

## 2012-02-12 NOTE — Telephone Encounter (Signed)
New problem:   Wife calling C/O both feet's are swollen again, trouble walking. No appetite .  Think he in CHF again . Would like for him to be seen today.

## 2012-02-12 NOTE — Progress Notes (Signed)
[address], [place], [place]  [address] Phone: [phone], Fax:  [phone]  Date:  02/12/2012   Name:  Gary [name]   DOB:  June 08, 1929   MRN:  [id number]  PCP:  Garnet Koyanagi, DO  Primary Cardiologist:  Dr. Jenell Milliner  Primary Electrophysiologist:  None    History of Present Illness: Gary [name] is a 78 y.o. male who returns for evaluation of LE edema.  Has a hx of CAD, status post CABG in 1995, ischemic cardiomyopathy with a previous EF of 20-30%, mitral regurgitation, aortic insufficiency, systolic CHF, permanent atrial fibrillation, LBBB, HTN, HL, CKD.  Echo 7/09: EF 35-40%, basal inferoposterior HK, mild AI, mild to moderate MR, moderate to marked LAE, mild to moderate TR, mildly increased PASP, moderate RAE. Last seen by Dr. Verl Blalock 4/12.  Patient was recently placed on oxybutynin. He took this for about 2 and a half months. He stopped about one week ago due to increased LE edema. He notes increased abdominal girth. He denies any shortness of breath, chest pain or syncope. He notes pain in his legs due to edema. He sleeps on an incline for acid reflux. He denies PND. He has a non-productive cough. He denies fevers but has had some chills.  Labs (2/12):   K 4.6, creatinine 2.4, ALT 14, TSH 0.99 Labs (8/13):   K 4.6, creatinine 2.5, ALT 13, LDL 52, Hgb 12  Wt Readings from Last 3 Encounters:  02/12/12 235 lb 9.6 oz (106.867 kg)  11/23/11 219 lb (99.338 kg)  09/14/11 226 lb (102.513 kg)     Past Medical History  Diagnosis Date  . Neurogenic bladder     has had bladder catheter changes at the urology office every 12 weeks  . Anemia   . Hyperlipidemia   . Hypertension   . Heart attack     1983  . CHF (congestive heart failure)   . Arrhythmia     afib  . Warfarin anticoagulation   . GERD (gastroesophageal reflux disease)   . Anxiety   . Renal failure     Current Outpatient Prescriptions  Medication Sig Dispense Refill  . acetaminophen  (TYLENOL) 500 MG tablet Take 650 mg by mouth as needed. For Arthritis      . aspirin 81 MG tablet Take 81 mg by mouth daily.        . carvedilol (COREG) 25 MG tablet TAKE 1 TABLET BY MOUTH TWICE DAILY  180 tablet  0  . Cholecalciferol (VITAMIN D) 1000 UNITS capsule Take 200 Units by mouth daily.       . ciprofloxacin (CIPRO) 500 MG tablet       . clonazePAM (KLONOPIN) 1 MG tablet TAKE 1 TABLET BY MOUTH TWICE DAILY AS NEEDED FOR ANXIETY  30 tablet  0  . enalapril (VASOTEC) 20 MG tablet TAKE 1 TABLET BY MOUTH TWICE DAILY  180 tablet  1  . fenofibrate 54 MG tablet TAKE 1 TABLET BY MOUTH TWICE DAILY  180 tablet  0  . ferrous sulfate 325 (65 FE) MG tablet Take 325 mg by mouth. 2 by month daily      . folic acid (FOLVITE) 1 MG tablet Take 1 mg by mouth daily.        . furosemide (LASIX) 40 MG tablet TAKE 2 TABLETS BY MOUTH DAILY  180 tablet  0  . Misc Natural Products (OSTEO BI-FLEX ADV DOUBLE ST PO) Take by mouth.        Marland Kitchen  Multiple Vitamin (MULTIVITAMIN) capsule Take 1 capsule by mouth daily.        . ondansetron (ZOFRAN) 8 MG tablet TAKE 1 TABLET BY MOUTH EVERY 12 HOURS AS NEEDED FOR NAUSEA  30 tablet  0  . potassium chloride (KLOR-CON) 10 MEQ CR tablet Take 10 mEq by mouth daily.        . potassium chloride (MICRO-K) 10 MEQ CR capsule TAKE ONE CAPSULE BY MOUTH DAILY  90 capsule  1  . Pyridoxine HCl (VITAMIN B-6) 500 MG tablet Take 500 mg by mouth daily.        Marland Kitchen warfarin (COUMADIN) 2.5 MG tablet TAKE 1 TABLET BY MOUTH DAILY  100 tablet  0  . diphenhydrAMINE (BENADRYL) 25 MG tablet Take 25 mg by mouth every 6 (six) hours as needed.        Marland Kitchen levofloxacin (LEVAQUIN) 500 MG tablet Take 500 mg by mouth daily.      . nitrofurantoin, macrocrystal-monohydrate, (MACROBID) 100 MG capsule Take 100 mg by mouth 2 (two) times daily.      Marland Kitchen omeprazole (PRILOSEC) 20 MG capsule Take 20 mg by mouth daily.      . Omeprazole 20 MG TBEC TAKE 1 TABLET BY MOUTH TWICE DAILY  168 tablet  0    Allergies: Allergies    Allergen Reactions  . Amoxicillin     REACTION: ha/nausea  . Ciprofloxacin     REACTION: diarrhea/nausea  . Diltiazem Hcl     REACTION: low bp  . Hydrocodone-Acetaminophen     REACTION: ha  . Niacin     REACTION: ha/nausea  . Niacin-Lovastatin Er     REACTION: ha/nausea  . Paroxetine     REACTION: ha/disoriented  . Propoxyphene-Acetaminophen     REACTION: headache  . Pseudoephedrine     REACTION: stopped urine flow  . Sulfonamide Derivatives     REACTION: ha/nausea  . Tolterodine Tartrate     REACTION: bladder stopped    Social History:  The patient  reports that he has quit smoking. He does not have any smokeless tobacco history on file. He reports that he does not drink alcohol or use illicit drugs.   Family History:   The patient'sfamily history includes Breast cancer in his sister; Cancer in his mother; and Heart attack in his father.   ROS:  Please see the history of present illness.   Denies vomiting, diarrhea, melena, hematochezia, hematuria.   All other systems reviewed and negative.   PHYSICAL EXAM: VS:  BP 100/54  Pulse 69  Ht 6\' 1"  (1.854 m)  Wt 235 lb 9.6 oz (106.867 kg)  BMI 31.08 kg/m2 Well nourished, well developed, in no acute distress HEENT: normal Neck:  positive JVD at 90  Cardiac:  normal S1, S2;  irregularly irregular ; no murmur Lungs:   decreased breath sounds  bilaterally, no wheezing, rhonchi or rales Abd:  distended, hyperresonance to percussion noted Ext: tight 2+ bilateral LE edema up to the knee Back: Positive presacral edema  Skin: warm and dry Neuro:  CNs 2-12 intact, no focal abnormalities noted  EKG:  Atrial fibrillation, HR 69, LBBB      ASSESSMENT AND PLAN:  1.  Acute on Chronic Systolic CHF:    He presents with anasarca. He has significant evidence of ascites on exam.  I had a long discussion with him and his son today regarding further treatment. At this point, I will pursue increasing his Lasix to 80 mg twice daily. His  chronic kidney  disease may make diuresis difficult. I will check a basic metabolic panel today as well as LFTs. Given the appearance of his LE edema, also check an INR. If this is subtherapeutic, I will arrange bilateral LE venous Dopplers to rule out DVT. He will return in 1 week for followup. If he has not had any significant diuresis by that point, he will likely need admission to the hospital for IV diuresis. Given the appearance of his abdomen, he may ultimately need paracentesis if we have no success with diuresis. I will also arrange followup echocardiogram to reassess his LV function.   2. Coronary Artery Disease:    No angina. Continue aspirin and statin.  3. Atrial Fibrillation:  Rate controlled. Continue Coumadin. Coumadin is managed by his PCP.   4. Hypertension:  Controlled.  Continue current therapy.   5. Bladder Outlet Obstruction:   He has an indwelling catheter and is followed by urology.    Signed, Richardson Dopp, PA-C  12:30 PM 02/12/2012

## 2012-02-13 ENCOUNTER — Telehealth: Payer: Self-pay | Admitting: *Deleted

## 2012-02-13 NOTE — Telephone Encounter (Signed)
Message copied by Michae Kava on Tue Feb 13, 2012 11:51 AM ------      Message from: [place], [place] T      Created: Tue Feb 13, 2012  8:23 AM       Renal function stable      LFTs ok      INR is therapeutic.  Fax labs to PCP who manages his coumadin.      Continue with current treatment plan.      Richardson Dopp, PA-C  8:23 AM 02/13/2012

## 2012-02-13 NOTE — Telephone Encounter (Signed)
both pt and his wife aware of lab results w/verbal understanding. pt and wife asked if could move echo/SW appt to earlier in the day on 12/23. I advised I will look at schedules and cb later, pt said ok

## 2012-02-13 NOTE — Telephone Encounter (Signed)
pt and wife both aware of echo and SW, PA appt rsc to 12/24 per pt req. both verbalized understanding

## 2012-02-14 ENCOUNTER — Encounter (HOSPITAL_BASED_OUTPATIENT_CLINIC_OR_DEPARTMENT_OTHER): Payer: Self-pay

## 2012-02-14 ENCOUNTER — Emergency Department (HOSPITAL_BASED_OUTPATIENT_CLINIC_OR_DEPARTMENT_OTHER)
Admission: EM | Admit: 2012-02-14 | Discharge: 2012-02-14 | Disposition: A | Discharge status: Home / Self Care | Payer: Medicare Other | Attending: Emergency Medicine | Admitting: Emergency Medicine

## 2012-02-14 DIAGNOSIS — Z79899 Other long term (current) drug therapy: Secondary | ICD-10-CM | POA: Insufficient documentation

## 2012-02-14 DIAGNOSIS — R319 Hematuria, unspecified: Secondary | ICD-10-CM | POA: Insufficient documentation

## 2012-02-14 DIAGNOSIS — R339 Retention of urine, unspecified: Secondary | ICD-10-CM | POA: Insufficient documentation

## 2012-02-14 DIAGNOSIS — Z87448 Personal history of other diseases of urinary system: Secondary | ICD-10-CM | POA: Insufficient documentation

## 2012-02-14 DIAGNOSIS — Z7901 Long term (current) use of anticoagulants: Secondary | ICD-10-CM | POA: Insufficient documentation

## 2012-02-14 DIAGNOSIS — Z8679 Personal history of other diseases of the circulatory system: Secondary | ICD-10-CM | POA: Insufficient documentation

## 2012-02-14 DIAGNOSIS — D649 Anemia, unspecified: Secondary | ICD-10-CM | POA: Insufficient documentation

## 2012-02-14 DIAGNOSIS — Z8673 Personal history of transient ischemic attack (TIA), and cerebral infarction without residual deficits: Secondary | ICD-10-CM | POA: Insufficient documentation

## 2012-02-14 DIAGNOSIS — F411 Generalized anxiety disorder: Secondary | ICD-10-CM | POA: Insufficient documentation

## 2012-02-14 DIAGNOSIS — Z9889 Other specified postprocedural states: Secondary | ICD-10-CM | POA: Insufficient documentation

## 2012-02-14 DIAGNOSIS — K219 Gastro-esophageal reflux disease without esophagitis: Secondary | ICD-10-CM | POA: Insufficient documentation

## 2012-02-14 DIAGNOSIS — I10 Essential (primary) hypertension: Secondary | ICD-10-CM | POA: Insufficient documentation

## 2012-02-14 DIAGNOSIS — E785 Hyperlipidemia, unspecified: Secondary | ICD-10-CM | POA: Insufficient documentation

## 2012-02-14 DIAGNOSIS — Z7982 Long term (current) use of aspirin: Secondary | ICD-10-CM | POA: Insufficient documentation

## 2012-02-14 DIAGNOSIS — Z951 Presence of aortocoronary bypass graft: Secondary | ICD-10-CM | POA: Insufficient documentation

## 2012-02-14 DIAGNOSIS — Z87891 Personal history of nicotine dependence: Secondary | ICD-10-CM | POA: Insufficient documentation

## 2012-02-14 DIAGNOSIS — I509 Heart failure, unspecified: Secondary | ICD-10-CM | POA: Insufficient documentation

## 2012-02-14 DIAGNOSIS — N19 Unspecified kidney failure: Secondary | ICD-10-CM | POA: Insufficient documentation

## 2012-02-14 LAB — URINALYSIS, MICROSCOPIC ONLY
Bilirubin Urine: NEGATIVE
Glucose, UA: NEGATIVE mg/dL
Ketones, ur: NEGATIVE mg/dL
Protein, ur: NEGATIVE mg/dL
pH: 6.5 (ref 5.0–8.0)

## 2012-02-14 MED ORDER — LIDOCAINE HCL 2 % EX GEL
CUTANEOUS | Status: AC
  Filled 2012-02-14: qty 20

## 2012-02-14 MED ORDER — LIDOCAINE HCL 2 % EX GEL
Freq: Once | CUTANEOUS | Status: AC
  Administered 2012-02-14: 20 via URETHRAL

## 2012-02-14 NOTE — ED Notes (Signed)
Pt has an indwelling foley and reports he had not been able to void today.

## 2012-02-14 NOTE — ED Provider Notes (Signed)
History     CSN: [id number]  Arrival date & time 02/14/12  1819   First MD Initiated Contact with Patient 02/14/12 1906      Chief Complaint  Patient presents with  . Urinary Retention    (Consider location/radiation/quality/duration/timing/severity/associated sxs/prior treatment) HPI Comments: Pt reports he has had a foley catheter for over 12 years.   Pt reports he is suppose to see urologist in 2 days for foley change however foley stopped draining today.  Pt reports this is not uncommon.   Pt is on coumadin.   He reports he always has a small amount of blood when catheter is changed.   Pt denies fever, no chills  No uti symptoms. Nothing makes better or worse  The history is provided by the patient. No language interpreter was used.    Past Medical History  Diagnosis Date  . Neurogenic bladder     has had bladder catheter changes at the urology office every 12 weeks  . Anemia   . Hyperlipidemia   . Hypertension   . Heart attack     1983  . CHF (congestive heart failure)   . Arrhythmia     afib  . Warfarin anticoagulation   . GERD (gastroesophageal reflux disease)   . Anxiety   . Renal failure     Past Surgical History  Procedure Date  . Appendectomy   . Cholecystectomy     1971  . Coronary artery bypass graft     1995    Family History  Problem Relation Age of Onset  . Cancer Mother   . Heart attack Father     deceased at 37, smoking, ETOH abuse  . Breast cancer Sister     History  Substance Use Topics  . Smoking status: Former Research scientist (life sciences)  . Smokeless tobacco: Not on file  . Alcohol Use: No      Review of Systems  Genitourinary: Positive for hematuria.  All other systems reviewed and are negative.    Allergies  Amoxicillin; Ciprofloxacin; Diltiazem hcl; Hydrocodone-acetaminophen; Niacin; Niacin-lovastatin er; Paroxetine; Propoxyphene-acetaminophen; Pseudoephedrine; Sulfonamide derivatives; and Tolterodine tartrate  Home Medications   Current  Outpatient Rx  Name  Route  Sig  Dispense  Refill  . ACETAMINOPHEN 500 MG PO TABS   Oral   Take 650 mg by mouth as needed. For Arthritis         . ASPIRIN 81 MG PO TABS   Oral   Take 81 mg by mouth daily.           Marland Kitchen CARVEDILOL 25 MG PO TABS      TAKE 1 TABLET BY MOUTH TWICE DAILY   180 tablet   0   . VITAMIN D 1000 UNITS PO CAPS   Oral   Take 200 Units by mouth daily.          Marland Kitchen CIPROFLOXACIN HCL 500 MG PO TABS               . CLONAZEPAM 1 MG PO TABS      TAKE 1 TABLET BY MOUTH TWICE DAILY AS NEEDED FOR ANXIETY   30 tablet   0   . DIPHENHYDRAMINE HCL 25 MG PO TABS   Oral   Take 25 mg by mouth every 6 (six) hours as needed.           . ENALAPRIL MALEATE 20 MG PO TABS      TAKE 1 TABLET BY MOUTH TWICE DAILY   180 tablet  1   . FENOFIBRATE 54 MG PO TABS      TAKE 1 TABLET BY MOUTH TWICE DAILY   180 tablet   0   . FERROUS SULFATE 325 (65 FE) MG PO TABS   Oral   Take 325 mg by mouth. 2 by month daily         . FOLIC ACID 1 MG PO TABS   Oral   Take 1 mg by mouth daily.           . FUROSEMIDE 40 MG PO TABS   Oral   Take 2 tablets (80 mg total) by mouth 2 (two) times daily.   120 tablet   6   . LEVOFLOXACIN 500 MG PO TABS   Oral   Take 500 mg by mouth daily.         . OSTEO BI-FLEX ADV DOUBLE ST PO   Oral   Take by mouth.           . MULTIVITAMINS PO CAPS   Oral   Take 1 capsule by mouth daily.           Marland Kitchen NITROFURANTOIN MONOHYD MACRO 100 MG PO CAPS   Oral   Take 100 mg by mouth 2 (two) times daily.         Marland Kitchen OMEPRAZOLE 20 MG PO CPDR   Oral   Take 20 mg by mouth daily.         Marland Kitchen OMEPRAZOLE 20 MG PO TBEC      TAKE 1 TABLET BY MOUTH TWICE DAILY   168 tablet   0   . ONDANSETRON HCL 8 MG PO TABS      TAKE 1 TABLET BY MOUTH EVERY 12 HOURS AS NEEDED FOR NAUSEA   30 tablet   0   . POTASSIUM CHLORIDE 10 MEQ PO TBCR   Oral   Take 10 mEq by mouth daily.           Marland Kitchen POTASSIUM CHLORIDE ER 10 MEQ PO CPCR       TAKE ONE CAPSULE BY MOUTH DAILY   90 capsule   1   . VITAMIN B-6 500 MG PO TABS   Oral   Take 500 mg by mouth daily.           . WARFARIN SODIUM 2.5 MG PO TABS      TAKE 1 TABLET BY MOUTH DAILY   100 tablet   0     BP 139/72  Pulse 92  Temp 98 F (36.7 C) (Oral)  Resp 18  Ht 6\' 1"  (1.854 m)  Wt 225 lb (102.059 kg)  BMI 29.69 kg/m2  SpO2 96%  Physical Exam  Nursing note and vitals reviewed. Constitutional: He appears well-developed and well-nourished.  HENT:  Head: Normocephalic and atraumatic.  Eyes: Conjunctivae normal and EOM are normal. Pupils are equal, round, and reactive to light.  Neck: Normal range of motion. Neck supple.  Cardiovascular: Normal rate and normal heart sounds.   Pulmonary/Chest: Effort normal and breath sounds normal.  Abdominal: Soft. Bowel sounds are normal.  Musculoskeletal: Normal range of motion.  Neurological: He is alert.  Skin: Skin is warm.    ED Course  Procedures (including critical care time)   Labs Reviewed  URINALYSIS, MICROSCOPIC ONLY   No results found.   1. Urinary retention       MDM  Foley replaced,   Pt reports he feels much better        Magda Paganini  Brandon Melnick, PA 02/14/12 [place], [place] 02/14/12 1939

## 2012-02-14 NOTE — ED Notes (Signed)
Pt brought own cath 18 fr with 10 cc bulbreplaced

## 2012-02-14 NOTE — ED Notes (Signed)
pa at bedside. 

## 2012-02-14 NOTE — Telephone Encounter (Signed)
Patient has been made aware and voiced understanding, he said he has so many other doctors and apts he did not need another.    KP

## 2012-02-15 ENCOUNTER — Ambulatory Visit: Payer: Medicare Other

## 2012-02-15 NOTE — ED Provider Notes (Signed)
History/physical exam/procedure(s) were performed by non-physician practitioner and as supervising physician I was immediately available for consultation/collaboration. I have reviewed all notes and am in agreement with care and plan.   Shaune Pollack, MD 02/15/12 [id number]

## 2012-02-17 LAB — URINE CULTURE: Colony Count: >=100000

## 2012-02-18 NOTE — ED Notes (Signed)
+   Urine Chart sent to EDP office for review. 

## 2012-02-19 ENCOUNTER — Ambulatory Visit: Payer: Medicare Other | Admitting: Physician Assistant

## 2012-02-19 ENCOUNTER — Other Ambulatory Visit (HOSPITAL_COMMUNITY): Payer: Medicare Other

## 2012-02-20 ENCOUNTER — Encounter: Payer: Self-pay | Admitting: Physician Assistant

## 2012-02-20 ENCOUNTER — Ambulatory Visit (INDEPENDENT_AMBULATORY_CARE_PROVIDER_SITE_OTHER): Payer: Medicare Other | Admitting: Physician Assistant

## 2012-02-20 ENCOUNTER — Telehealth: Payer: Self-pay | Admitting: *Deleted

## 2012-02-20 ENCOUNTER — Ambulatory Visit (HOSPITAL_COMMUNITY): Payer: Medicare Other | Attending: Cardiology | Admitting: Radiology

## 2012-02-20 VITALS — BP 99/74 | HR 78 | Ht 73.0 in | Wt 219.4 lb

## 2012-02-20 DIAGNOSIS — I517 Cardiomegaly: Secondary | ICD-10-CM | POA: Insufficient documentation

## 2012-02-20 DIAGNOSIS — I509 Heart failure, unspecified: Secondary | ICD-10-CM

## 2012-02-20 DIAGNOSIS — R609 Edema, unspecified: Secondary | ICD-10-CM

## 2012-02-20 DIAGNOSIS — I059 Rheumatic mitral valve disease, unspecified: Secondary | ICD-10-CM | POA: Insufficient documentation

## 2012-02-20 DIAGNOSIS — I4891 Unspecified atrial fibrillation: Secondary | ICD-10-CM

## 2012-02-20 DIAGNOSIS — I10 Essential (primary) hypertension: Secondary | ICD-10-CM | POA: Insufficient documentation

## 2012-02-20 DIAGNOSIS — I5023 Acute on chronic systolic (congestive) heart failure: Secondary | ICD-10-CM

## 2012-02-20 DIAGNOSIS — E785 Hyperlipidemia, unspecified: Secondary | ICD-10-CM | POA: Insufficient documentation

## 2012-02-20 DIAGNOSIS — I447 Left bundle-branch block, unspecified: Secondary | ICD-10-CM | POA: Insufficient documentation

## 2012-02-20 DIAGNOSIS — I2589 Other forms of chronic ischemic heart disease: Secondary | ICD-10-CM | POA: Insufficient documentation

## 2012-02-20 DIAGNOSIS — R0989 Other specified symptoms and signs involving the circulatory and respiratory systems: Secondary | ICD-10-CM

## 2012-02-20 DIAGNOSIS — I5022 Chronic systolic (congestive) heart failure: Secondary | ICD-10-CM | POA: Insufficient documentation

## 2012-02-20 DIAGNOSIS — I25810 Atherosclerosis of coronary artery bypass graft(s) without angina pectoris: Secondary | ICD-10-CM

## 2012-02-20 LAB — BASIC METABOLIC PANEL
BUN: 72 mg/dL — ABNORMAL HIGH (ref 6–23)
Calcium: 10.5 mg/dL (ref 8.4–10.5)
Creatinine, Ser: 3.3 mg/dL — ABNORMAL HIGH (ref 0.4–1.5)
GFR: 19.43 mL/min — ABNORMAL LOW (ref >60.00)
Glucose, Bld: 105 mg/dL — ABNORMAL HIGH (ref 70–99)

## 2012-02-20 LAB — CBC WITH DIFFERENTIAL/PLATELET
Basophils Absolute: 0 10*3/uL (ref 0.0–0.1)
HCT: 34.9 % — ABNORMAL LOW (ref 39.0–52.0)
Lymphocytes Relative: 19.3 % (ref 12.0–46.0)
Lymphs Abs: 0.9 10*3/uL (ref 0.7–4.0)
Monocytes Relative: 7.7 % (ref 3.0–12.0)
Neutrophils Relative %: 71.5 % (ref 43.0–77.0)
Platelets: 176 10*3/uL (ref 150.0–400.0)
RDW: 15.8 % — ABNORMAL HIGH (ref 11.5–14.6)

## 2012-02-20 MED ORDER — FUROSEMIDE 40 MG PO TABS: 80.0000 mg | ORAL_TABLET | Freq: Every day | ORAL | Status: DC

## 2012-02-20 MED ORDER — FUROSEMIDE 40 MG PO TABS: 80.0000 mg | ORAL_TABLET | ORAL | Status: DC

## 2012-02-20 NOTE — Progress Notes (Signed)
[address], [place], [place]  [address] Phone: [phone], Fax:  [phone]  Date:  02/20/2012   Name:  Gary [name]   DOB:  May 23, 1929   MRN:  [id number]  PCP:  Garnet Koyanagi, DO  Primary Cardiologist:  Dr. Jenell Milliner  Primary Electrophysiologist:  None    History of Present Illness: Gary [name] is a 78 y.o. male who returns for followup on CHF.  Has a hx of CAD, status post CABG in 1995, ischemic cardiomyopathy with a previous EF of 20-30%, mitral regurgitation, aortic insufficiency, systolic CHF, permanent atrial fibrillation, LBBB, HTN, HL, CKD.  Echo 7/09: EF 35-40%, basal inferoposterior HK, mild AI, mild to moderate MR, moderate to marked LAE, mild to moderate TR, mildly increased PASP, moderate RAE.    I saw him 12/16 with significant volume overload. I adjusted his Lasix. Since increasing his Lasix. He has had significant improvement in his LE edema. He notes improved dyspnea. He sleeps at an incline due to acid reflux. He denies PND. He notes decreased abdominal girth. He denies cough. He denies chest pain or syncope.  Labs (2/12):   K 4.6, creatinine 2.4, ALT 14, TSH 0.99 Labs (8/13):   K 4.6, creatinine 2.5, ALT 13, LDL 52, Hgb 12 Labs (12/13): K 4.7, creatinine 2.2, ALT 9, INR 3.0  Wt Readings from Last 3 Encounters:  02/20/12 219 lb 6.4 oz (99.519 kg)  02/14/12 225 lb (102.059 kg)  02/12/12 235 lb 9.6 oz (106.867 kg)     Past Medical History  Diagnosis Date  . Neurogenic bladder     has had bladder catheter changes at the urology office every 12 weeks  . Anemia   . Hyperlipidemia   . Hypertension   . Heart attack     1983  . CHF (congestive heart failure)   . Arrhythmia     afib  . Warfarin anticoagulation   . GERD (gastroesophageal reflux disease)   . Anxiety   . Renal failure     Current Outpatient Prescriptions  Medication Sig Dispense Refill  . acetaminophen (TYLENOL) 500 MG tablet Take 650 mg by mouth as  needed. For Arthritis      . aspirin 81 MG tablet Take 81 mg by mouth daily.        . carvedilol (COREG) 25 MG tablet TAKE 1 TABLET BY MOUTH TWICE DAILY  180 tablet  0  . Cholecalciferol (VITAMIN D) 1000 UNITS capsule Take 200 Units by mouth daily.       . ciprofloxacin (CIPRO) 500 MG tablet       . clonazePAM (KLONOPIN) 1 MG tablet TAKE 1 TABLET BY MOUTH TWICE DAILY AS NEEDED FOR ANXIETY  30 tablet  0  . diphenhydrAMINE (BENADRYL) 25 MG tablet Take 25 mg by mouth every 6 (six) hours as needed.        . enalapril (VASOTEC) 20 MG tablet TAKE 1 TABLET BY MOUTH TWICE DAILY  180 tablet  1  . fenofibrate 54 MG tablet TAKE 1 TABLET BY MOUTH TWICE DAILY  180 tablet  0  . ferrous sulfate 325 (65 FE) MG tablet Take 325 mg by mouth. 2 by month daily      . folic acid (FOLVITE) 1 MG tablet Take 1 mg by mouth daily.        . furosemide (LASIX) 40 MG tablet Take 2 tablets (80 mg total) by mouth 2 (two) times daily.  120 tablet  6  . Misc  Natural Products (OSTEO BI-FLEX ADV DOUBLE ST PO) Take by mouth.        . Multiple Vitamin (MULTIVITAMIN) capsule Take 1 capsule by mouth daily.        . ondansetron (ZOFRAN) 8 MG tablet TAKE 1 TABLET BY MOUTH EVERY 12 HOURS AS NEEDED FOR NAUSEA  30 tablet  0  . potassium chloride (KLOR-CON) 10 MEQ CR tablet Take 10 mEq by mouth daily.        . potassium chloride (MICRO-K) 10 MEQ CR capsule TAKE ONE CAPSULE BY MOUTH DAILY  90 capsule  1  . Pyridoxine HCl (VITAMIN B-6) 500 MG tablet Take 500 mg by mouth daily.        Marland Kitchen warfarin (COUMADIN) 2.5 MG tablet TAKE 1 TABLET BY MOUTH DAILY  100 tablet  0  . levofloxacin (LEVAQUIN) 500 MG tablet Take 500 mg by mouth daily.      . nitrofurantoin, macrocrystal-monohydrate, (MACROBID) 100 MG capsule Take 100 mg by mouth 2 (two) times daily.      Marland Kitchen omeprazole (PRILOSEC) 20 MG capsule Take 20 mg by mouth daily.      . Omeprazole 20 MG TBEC TAKE 1 TABLET BY MOUTH TWICE DAILY  168 tablet  0    Allergies: Allergies  Allergen Reactions    . Amoxicillin     REACTION: ha/nausea  . Ciprofloxacin     REACTION: diarrhea/nausea  . Diltiazem Hcl     REACTION: low bp  . Hydrocodone-Acetaminophen     REACTION: ha  . Niacin     REACTION: ha/nausea  . Niacin-Lovastatin Er     REACTION: ha/nausea  . Paroxetine     REACTION: ha/disoriented  . Propoxyphene-Acetaminophen     REACTION: headache  . Pseudoephedrine     REACTION: stopped urine flow  . Sulfonamide Derivatives     REACTION: ha/nausea  . Tolterodine Tartrate     REACTION: bladder stopped    Social History:  The patient  reports that he has quit smoking. He does not have any smokeless tobacco history on file. He reports that he does not drink alcohol or use illicit drugs.   ROS:  Please see the history of present illness.    All other systems reviewed and negative.   PHYSICAL EXAM: VS:  BP 99/74  Pulse 78  Ht 6\' 1"  (1.854 m)  Wt 219 lb 6.4 oz (99.519 kg)  BMI 28.95 kg/m2  SpO2 97% Well nourished, well developed, in no acute distress HEENT: normal Neck:  no JVD at 90  Cardiac:  normal S1, S2;  irregularly irregular ; no murmur Lungs:   decreased breath sounds  bilaterally, no wheezing, rhonchi or rales Abd:  Slightly distended with hyperresonance - improved from prior exam Ext: tight trace bilateral LE edema Skin: warm and dry Neuro:  CNs 2-12 intact, no focal abnormalities noted   ASSESSMENT AND PLAN:  1.  Acute on Chronic Systolic CHF:    His weight is down 16 pounds since his last visit. His symptoms ahve improved. Follow up echo is pending from earlier today.  The echo tech did come get me at one point this morning to report that his gait was unstable. I walked Gary [name] in the room and his balance is good. We checked his sugar and it was in the 130s. His oxygen saturation was also optimal. He does not eat that much. I have asked him to eat more regularly but to also watch his salt. I will have him decrease  his Lasix to 80 mg in the morning and 40 mg in  the afternoon. Check a followup basic metabolic panel today. Plan followup with me 2-3 weeks.   2. Coronary Artery Disease:    No angina. Continue aspirin and statin.  3. Atrial Fibrillation:  Rate controlled. He remains on Coumadin. Coumadin is managed by his PCP.   4. Hypertension:  Controlled.  Continue current therapy.   5. Bladder Outlet Obstruction:   He has an indwelling catheter and is followed by urology.    6. Disposition:  Follow up with me in 2-3 weeks.   Signed, Richardson Dopp, PA-C  10:35 AM 02/20/2012

## 2012-02-20 NOTE — Progress Notes (Signed)
Echocardiogram performed.  

## 2012-02-20 NOTE — Patient Instructions (Addendum)
Decrease Lasix (furosemide) to 80 mg in the morning and 40 mg in the evening. Weigh daily and call if:  Weight up 3 lbs in one day or 5 lbs in one week, increased swelling or increased dyspnea.   PLEASE FOLLOW UP WITH SCOTT WEAVER, PAC IN THE NEXT 2-3 WEEKS  DECREASE LASIX TO 80 MG IN THE MORNING AND 40 MG IN THE EVENING  LAB TODAY BMET, CBC W/DIFF PER [facility] FOR [name]

## 2012-02-20 NOTE — Telephone Encounter (Signed)
pt and wife both notified about lab results and to hold evening dose of lasix today and resume 80 mg daily 02/21/12. bmet will be done on 02/26/12. both pt and wife aware to have pt weigh daily and call if wt > 3 lb's x 1 day

## 2012-02-20 NOTE — Telephone Encounter (Signed)
Message copied by Michae Kava on Tue Feb 20, 2012  1:34 PM ------      Message from: [place], [place] T      Created: Tue Feb 20, 2012 12:34 PM       EF is better than prior study.      RV function is weak and likely explains edema in legs, abdomen, etc.      Continue with current treatment plan and follow up as planned.      Richardson Dopp, PA-C  12:34 PM 02/20/2012

## 2012-02-20 NOTE — Telephone Encounter (Signed)
Message copied by Michae Kava on Tue Feb 20, 2012  1:23 PM ------      Message from: [place], [place] T      Created: Tue Feb 20, 2012  1:23 PM       Hgb stable      Creatinine high      Have him hold evening dose of Lasix today.      Continue on Lasix 80 mg QD       Check BMET on Friday 12/27 or Monday 12/30      Weigh daily and call if:  Weight up 3 lbs in one day, increased swelling or increased dyspnea.       Richardson Dopp, PA-C  1:22 PM 02/20/2012

## 2012-02-20 NOTE — Telephone Encounter (Signed)
pt notified about his echo results w/verbal understanding today, f/u appt made to see PA 03/11/12 @ 3:40

## 2012-02-22 ENCOUNTER — Other Ambulatory Visit: Payer: Self-pay | Admitting: Family Medicine

## 2012-02-22 NOTE — Telephone Encounter (Signed)
Refill done.  

## 2012-02-23 NOTE — ED Notes (Signed)
Suprax 260 mg daily for 7 days disp : 50 ml per Delos Haring

## 2012-02-25 ENCOUNTER — Telehealth (HOSPITAL_COMMUNITY): Payer: Self-pay | Admitting: Emergency Medicine

## 2012-02-26 ENCOUNTER — Other Ambulatory Visit (INDEPENDENT_AMBULATORY_CARE_PROVIDER_SITE_OTHER): Payer: Medicare Other

## 2012-02-26 ENCOUNTER — Telehealth: Payer: Self-pay | Admitting: *Deleted

## 2012-02-26 DIAGNOSIS — I4891 Unspecified atrial fibrillation: Secondary | ICD-10-CM

## 2012-02-26 DIAGNOSIS — R609 Edema, unspecified: Secondary | ICD-10-CM

## 2012-02-26 LAB — BASIC METABOLIC PANEL
CO2: 30 mEq/L (ref 19–32)
Calcium: 9.5 mg/dL (ref 8.4–10.5)
Potassium: 4.7 mEq/L (ref 3.5–5.1)
Sodium: 137 mEq/L (ref 135–145)

## 2012-02-26 MED ORDER — FUROSEMIDE 40 MG PO TABS: 80.0000 mg | ORAL_TABLET | Freq: Every day | ORAL | Status: DC

## 2012-02-26 NOTE — Telephone Encounter (Signed)
pt's wife notified about lab results and med changes and repeat bmet 03/04/12.

## 2012-02-26 NOTE — Telephone Encounter (Signed)
Message copied by Michae Kava on Mon Feb 26, 2012  5:56 PM ------      Message from: [place], [place] T      Created: Mon Feb 26, 2012  5:07 PM       Creatinine a little better.      Hold Lasix x 1 day.      Then resume lasix as previously directed (80 mg QD).      He takes K+ 10 mEq QD.  Stop K+      Repeat bmet in one week.      Weigh daily and call if:  Weight up 3 lbs in one day, increased swelling or increased dyspnea.       Richardson Dopp, PA-C  5:07 PM 02/26/2012

## 2012-02-26 NOTE — Telephone Encounter (Signed)
s/w pt's wife stating that we got his weight records and that I will show them to [facility].

## 2012-03-04 ENCOUNTER — Other Ambulatory Visit (INDEPENDENT_AMBULATORY_CARE_PROVIDER_SITE_OTHER): Payer: Medicare Other

## 2012-03-04 DIAGNOSIS — I4891 Unspecified atrial fibrillation: Secondary | ICD-10-CM

## 2012-03-04 LAB — BASIC METABOLIC PANEL
BUN: 57 mg/dL — ABNORMAL HIGH (ref 6–23)
CO2: 30 mEq/L (ref 19–32)
Chloride: 101 mEq/L (ref 96–112)
Potassium: 4.7 mEq/L (ref 3.5–5.1)

## 2012-03-05 ENCOUNTER — Telehealth: Payer: Self-pay | Admitting: *Deleted

## 2012-03-05 NOTE — Telephone Encounter (Signed)
wife notified about lab results and also that I s/w [facility] and they will be rcving a call from someone with [facility] in the next 7-10 days about getting help. wife said ok

## 2012-03-05 NOTE — Telephone Encounter (Signed)
Message copied by Michae Kava on Tue Mar 05, 2012  4:33 PM ------      Message from: [place], [place] T      Created: Mon Mar 04, 2012  5:08 PM       Renal function stable.      Continue with current treatment plan.      Richardson Dopp, PA-C  5:08 PM 03/04/2012

## 2012-03-06 ENCOUNTER — Telehealth: Payer: Self-pay | Admitting: *Deleted

## 2012-03-06 NOTE — Telephone Encounter (Signed)
Message copied by Michae Kava on Wed Mar 06, 2012  9:53 AM ------      Message from: [place], [place] T      Created: Mon Mar 04, 2012  5:08 PM       Renal function stable.      Continue with current treatment plan.      Richardson Dopp, PA-C  5:08 PM 03/04/2012

## 2012-03-06 NOTE — Telephone Encounter (Signed)
s/w pt's wife last night and she is aware of lab results and that [facility] will be calling them in the next 7-10 days to see if we can offer some assistance per pt request

## 2012-03-08 ENCOUNTER — Other Ambulatory Visit: Payer: Self-pay | Admitting: Family Medicine

## 2012-03-08 NOTE — Telephone Encounter (Signed)
Last seen 07/11/11 and Zofran filled 01/04/12 and Klonopin filled 07/17/11 # 30. Please advise      KP

## 2012-03-11 ENCOUNTER — Ambulatory Visit: Payer: Medicare Other | Admitting: Physician Assistant

## 2012-03-12 ENCOUNTER — Other Ambulatory Visit: Payer: Self-pay | Admitting: Family Medicine

## 2012-03-13 ENCOUNTER — Other Ambulatory Visit: Payer: Self-pay | Admitting: Family Medicine

## 2012-03-13 NOTE — Telephone Encounter (Signed)
Call pharmacy and the RX was not received... I phoned it in with the same directions.      KP

## 2012-03-14 ENCOUNTER — Telehealth: Payer: Self-pay | Admitting: Family Medicine

## 2012-03-14 MED ORDER — OMEPRAZOLE 20 MG PO CPDR: 20.0000 mg | DELAYED_RELEASE_CAPSULE | Freq: Two times a day (BID) | ORAL | Status: DC

## 2012-03-14 NOTE — Telephone Encounter (Signed)
Refill: Omeprazole 20 mg tablets. Take 1 tablet by mouth twice daily. Qty 168. Last fill 03-14-12. **patient is requesting we change this to the rx version of omeprazole 20 mg capsules. Is this okay?**

## 2012-03-14 NOTE — Telephone Encounter (Signed)
Please advise      KP 

## 2012-03-14 NOTE — Telephone Encounter (Signed)
Ok to refill for a year? 

## 2012-04-03 ENCOUNTER — Ambulatory Visit (INDEPENDENT_AMBULATORY_CARE_PROVIDER_SITE_OTHER): Payer: Medicare Other | Admitting: *Deleted

## 2012-04-03 VITALS — BP 98/58 | HR 77 | Wt 214.0 lb

## 2012-04-03 DIAGNOSIS — Z7901 Long term (current) use of anticoagulants: Secondary | ICD-10-CM

## 2012-04-03 DIAGNOSIS — I4891 Unspecified atrial fibrillation: Secondary | ICD-10-CM

## 2012-04-03 LAB — POCT INR: INR: 2.1

## 2012-04-03 NOTE — Patient Instructions (Signed)
Return to office in 4 weeks  Continue current dose: 1 tab daily

## 2012-04-05 ENCOUNTER — Other Ambulatory Visit: Payer: Self-pay | Admitting: Family Medicine

## 2012-04-06 ENCOUNTER — Other Ambulatory Visit: Payer: Self-pay | Admitting: Family Medicine

## 2012-04-08 NOTE — Telephone Encounter (Signed)
Last seen 07/11/11 and filled 03/13/12 # 30. Please advise    KP

## 2012-04-14 ENCOUNTER — Encounter (HOSPITAL_BASED_OUTPATIENT_CLINIC_OR_DEPARTMENT_OTHER): Payer: Self-pay | Admitting: *Deleted

## 2012-04-14 ENCOUNTER — Emergency Department (HOSPITAL_BASED_OUTPATIENT_CLINIC_OR_DEPARTMENT_OTHER)
Admission: EM | Admit: 2012-04-14 | Discharge: 2012-04-15 | Disposition: A | Discharge status: Home / Self Care | Payer: Medicare Other | Attending: Emergency Medicine | Admitting: Emergency Medicine

## 2012-04-14 DIAGNOSIS — T839XXA Unspecified complication of genitourinary prosthetic device, implant and graft, initial encounter: Secondary | ICD-10-CM

## 2012-04-14 DIAGNOSIS — F411 Generalized anxiety disorder: Secondary | ICD-10-CM | POA: Insufficient documentation

## 2012-04-14 DIAGNOSIS — T83091A Other mechanical complication of indwelling urethral catheter, initial encounter: Secondary | ICD-10-CM | POA: Insufficient documentation

## 2012-04-14 DIAGNOSIS — Z87891 Personal history of nicotine dependence: Secondary | ICD-10-CM | POA: Insufficient documentation

## 2012-04-14 DIAGNOSIS — Z79899 Other long term (current) drug therapy: Secondary | ICD-10-CM | POA: Insufficient documentation

## 2012-04-14 DIAGNOSIS — I5022 Chronic systolic (congestive) heart failure: Secondary | ICD-10-CM | POA: Insufficient documentation

## 2012-04-14 DIAGNOSIS — Z8674 Personal history of sudden cardiac arrest: Secondary | ICD-10-CM | POA: Insufficient documentation

## 2012-04-14 DIAGNOSIS — R319 Hematuria, unspecified: Secondary | ICD-10-CM | POA: Insufficient documentation

## 2012-04-14 DIAGNOSIS — Z8679 Personal history of other diseases of the circulatory system: Secondary | ICD-10-CM | POA: Insufficient documentation

## 2012-04-14 DIAGNOSIS — K219 Gastro-esophageal reflux disease without esophagitis: Secondary | ICD-10-CM | POA: Insufficient documentation

## 2012-04-14 DIAGNOSIS — I10 Essential (primary) hypertension: Secondary | ICD-10-CM | POA: Insufficient documentation

## 2012-04-14 DIAGNOSIS — N319 Neuromuscular dysfunction of bladder, unspecified: Secondary | ICD-10-CM | POA: Insufficient documentation

## 2012-04-14 DIAGNOSIS — Z7982 Long term (current) use of aspirin: Secondary | ICD-10-CM | POA: Insufficient documentation

## 2012-04-14 DIAGNOSIS — Y846 Urinary catheterization as the cause of abnormal reaction of the patient, or of later complication, without mention of misadventure at the time of the procedure: Secondary | ICD-10-CM | POA: Insufficient documentation

## 2012-04-14 DIAGNOSIS — Z87448 Personal history of other diseases of urinary system: Secondary | ICD-10-CM | POA: Insufficient documentation

## 2012-04-14 DIAGNOSIS — D649 Anemia, unspecified: Secondary | ICD-10-CM | POA: Insufficient documentation

## 2012-04-14 DIAGNOSIS — E785 Hyperlipidemia, unspecified: Secondary | ICD-10-CM | POA: Insufficient documentation

## 2012-04-14 LAB — URINALYSIS, MICROSCOPIC ONLY
Nitrite: NEGATIVE
Specific Gravity, Urine: 1.012 (ref 1.005–1.030)
Urobilinogen, UA: 0.2 mg/dL (ref 0.0–1.0)

## 2012-04-14 LAB — PROTIME-INR
INR: 2.21 — ABNORMAL HIGH (ref 0.00–1.49)
Prothrombin Time: 23.6 seconds — ABNORMAL HIGH (ref 11.6–15.2)

## 2012-04-14 LAB — BASIC METABOLIC PANEL
CO2: 28 mEq/L (ref 19–32)
Chloride: 99 mEq/L (ref 96–112)
Creatinine, Ser: 2.6 mg/dL — ABNORMAL HIGH (ref 0.50–1.35)
Sodium: 138 mEq/L (ref 135–145)

## 2012-04-14 NOTE — ED Notes (Signed)
Attempt foley irrigation using sterile tech. Resistance Met when attempting to flush. MD aware and order received to replace his foley cath. Pt. Brought his sterile 18Fr Cath for [place] to use.

## 2012-04-14 NOTE — ED Notes (Signed)
I helped nurse drain and flush catheter patient already had placed. There was too much pressure to flush and we determined there may be blockage with old catheter. Per Dr. Christie Beckers, we replaced new 18 inch cath that patient supplied using all sterile techniques and sterile equipment. There was approx. 100 cc return to new catch bag I took sample from that return and brought to lab. Color was light yellow to rose, no cloudiness or obvious sign of infection. Patient comfort level was improved. I helped patient dress and placed diaper on under his pajama pant.

## 2012-04-14 NOTE — ED Notes (Addendum)
Pt states he has had an indwelling cath for 12 years and has had problems off and on, last time being this a.m. Just noticed some urine in foley bag. Also c/o nausea

## 2012-04-14 NOTE — ED Provider Notes (Signed)
History  This chart was scribed for Threasa Beards, MD by Roe Coombs, ED Scribe. The patient was seen in room MH02/MH02. Patient's care was started at 2028.   CSN: [id number]  Arrival date & time 04/14/12  1803   First MD Initiated Contact with Patient 04/14/12 2028      Chief Complaint  Patient presents with  . Urinary Retention     The history is provided by the patient. No language interpreter was used.    HPI Comments: Gary Kois. is a 79 y.o. male with history of neurogenic bladder and indwelling catheterization for the past 12 years who presents to the Emergency Department complaining of improving urinary retention and bladder fullness onset last night. Patient rates his discomfort upon arrival as 8/10. He states that he came into the ED today for evaluation because the catheter was plugged. Patient says that while he was waiting to be seen, urine started draining into his foley bag and he started to feel significantly improved. His catheter was last changed 2 weeks ago. Patient's other medical history includes MI, systolic heart failure, HTN, and atrial fibrillation. Patient is on Coumadin. He states that INR was 2.1 last week when last checked.    Past Medical History  Diagnosis Date  . Neurogenic bladder     has had bladder catheter changes at the urology office every 12 weeks  . Anemia   . Hyperlipidemia   . Hypertension   . Heart attack     1983  . Chronic systolic heart failure     a. prior EF 20-30%;  b. Echo 7/09: EF 35-40%, basal inferoposterior HK, mild AI, mild to moderate MR, moderate to marked LAE, mild to moderate TR, mildly increased PASP, moderate RAE;   c. Echo 01/2012:   mild LVH, EF 50%, Tr AI, mild MR, severe LAE, mod to severe RAE, mod to severe RVE, mod reduced RVSF, mod to severe TR, PASP 46  . Arrhythmia     afib  . Warfarin anticoagulation   . GERD (gastroesophageal reflux disease)   . Anxiety   . Renal failure     Past Surgical History   Procedure Laterality Date  . Appendectomy    . Cholecystectomy      1971  . Coronary artery bypass graft      1995    Family History  Problem Relation Age of Onset  . Cancer Mother   . Heart attack Father     deceased at 70, smoking, ETOH abuse  . Breast cancer Sister     History  Substance Use Topics  . Smoking status: Former Research scientist (life sciences)  . Smokeless tobacco: Not on file  . Alcohol Use: No      Review of Systems  Genitourinary: Positive for difficulty urinating.  All other systems reviewed and are negative.    Allergies  Amoxicillin; Ciprofloxacin; Diltiazem hcl; Hydrocodone-acetaminophen; Niacin; Niacin-lovastatin er; Paroxetine; Propoxyphene-acetaminophen; Pseudoephedrine; Sulfonamide derivatives; and Tolterodine tartrate  Home Medications   Current Outpatient Rx  Name  Route  Sig  Dispense  Refill  . acetaminophen (TYLENOL) 500 MG tablet   Oral   Take 650 mg by mouth as needed. For Arthritis         . aspirin 81 MG tablet   Oral   Take 81 mg by mouth daily.           . carvedilol (COREG) 25 MG tablet      Take 1 tab by mouth twice daily--office visit  due now   60 tablet   0   . Cholecalciferol (VITAMIN D) 1000 UNITS capsule   Oral   Take 200 Units by mouth daily.          . ciprofloxacin (CIPRO) 500 MG tablet               . clonazePAM (KLONOPIN) 1 MG tablet      TAKE 1 TABLET BY MOUTH TWICE DAILY AS NEEDED FOR ANXIETY   30 tablet   0   . diphenhydrAMINE (BENADRYL) 25 MG tablet   Oral   Take 25 mg by mouth every 6 (six) hours as needed.           . enalapril (VASOTEC) 20 MG tablet      TAKE 1 TABLET BY MOUTH TWICE DAILY   180 tablet   1   . fenofibrate 54 MG tablet      TAKE 1 TABLET BY MOUTH TWICE DAILY   180 tablet   0   . ferrous sulfate 325 (65 FE) MG tablet   Oral   Take 325 mg by mouth. 2 by month daily         . folic acid (FOLVITE) 1 MG tablet   Oral   Take 1 mg by mouth daily.           . furosemide  (LASIX) 40 MG tablet   Oral   Take 2 tablets (80 mg total) by mouth daily.         Marland Kitchen levofloxacin (LEVAQUIN) 500 MG tablet   Oral   Take 500 mg by mouth daily.         . Misc Natural Products (OSTEO BI-FLEX ADV DOUBLE ST PO)   Oral   Take by mouth.           . Multiple Vitamin (MULTIVITAMIN) capsule   Oral   Take 1 capsule by mouth daily.           . nitrofurantoin, macrocrystal-monohydrate, (MACROBID) 100 MG capsule   Oral   Take 100 mg by mouth 2 (two) times daily.         Marland Kitchen omeprazole (PRILOSEC) 20 MG capsule   Oral   Take 1 capsule (20 mg total) by mouth 2 (two) times daily.   180 capsule   3   . ondansetron (ZOFRAN) 8 MG tablet      TAKE 1 TABLET BY MOUTH EVERY 12 HOURS AS NEEDED FOR NAUSEA   30 tablet   0   . Pyridoxine HCl (VITAMIN B-6) 500 MG tablet   Oral   Take 500 mg by mouth daily.           Marland Kitchen warfarin (COUMADIN) 2.5 MG tablet      TAKE 1 TABLET BY MOUTH DAILY   90 tablet   0     **Patient requests 90 days supply**     Triage Vitals: BP 128/67  Pulse 74  Temp(Src) 98.1 F (36.7 C) (Oral)  Resp 18  Ht 6\' 2"  (1.88 m)  Wt 216 lb (97.977 kg)  BMI 27.72 kg/m2  SpO2 97%  Physical Exam  Nursing note and vitals reviewed. Constitutional: He is oriented to person, place, and time. He appears well-developed and well-nourished.  HENT:  Head: Normocephalic and atraumatic.  Eyes: Conjunctivae and EOM are normal. Pupils are equal, round, and reactive to light.  Neck: Normal range of motion. Neck supple.  Cardiovascular: Normal rate, regular rhythm and normal heart sounds.  Pulmonary/Chest: Effort normal and breath sounds normal.  Abdominal: Soft. Bowel sounds are normal.  Musculoskeletal: Normal range of motion.  Neurological: He is alert and oriented to person, place, and time.  Skin: Skin is warm and dry.  Psychiatric: He has a normal mood and affect.    ED Course  Procedures (including critical care time) DIAGNOSTIC  STUDIES: Oxygen Saturation is 97% on room air, adequate by my interpretation.    COORDINATION OF CARE: 8:41 PM- Patient informed of current plan for treatment and evaluation and agrees with plan at this time.    Labs Reviewed  BASIC METABOLIC PANEL - Abnormal; Notable for the following:    Glucose, Bld 119 (*)    BUN 64 (*)    Creatinine, Ser 2.60 (*)    GFR calc non Af Amer 22 (*)    GFR calc Af Amer 25 (*)    All other components within normal limits  PROTIME-INR - Abnormal; Notable for the following:    Prothrombin Time 23.6 (*)    INR 2.21 (*)    All other components within normal limits  URINALYSIS, MICROSCOPIC ONLY - Abnormal; Notable for the following:    APPearance CLOUDY (*)    Hgb urine dipstick LARGE (*)    Protein, ur 30 (*)    Leukocytes, UA LARGE (*)    Bacteria, UA MANY (*)    All other components within normal limits  URINE CULTURE     No results found.   1. Foley catheter problem   2. Hematuria       MDM  Pt presenting with urinary retention, he has chronic foley which was not putting out urine since last night.  Today in ED it started draining some grossly bloody urine (pt is on coumadin), foley not able to be flushed easily, so it was replaced without difficulty.  Pt feels much improved.  Pt has renal insufficiency which is at his baseline.  Urine culture sent.  Discharged with strict return precautions.  Pt agreeable with plan.    I personally performed the services described in this documentation, which was scribed in my presence. The recorded information has been reviewed and is accurate.    Threasa Beards, MD 04/17/12 2217

## 2012-04-14 NOTE — ED Notes (Signed)
MD with pt  

## 2012-04-17 LAB — URINE CULTURE

## 2012-04-18 NOTE — ED Notes (Signed)
+   urine Patient treated with cipro-sensitive to same-chart appended per protocol MD.

## 2012-05-03 ENCOUNTER — Other Ambulatory Visit: Payer: Self-pay | Admitting: Family Medicine

## 2012-05-09 ENCOUNTER — Other Ambulatory Visit: Payer: Self-pay | Admitting: Family Medicine

## 2012-05-09 NOTE — Telephone Encounter (Signed)
Last seen 07/11/11 and filled 04/06/12 #30. Please advise     KP

## 2012-05-31 ENCOUNTER — Other Ambulatory Visit: Payer: Self-pay | Admitting: Family Medicine

## 2012-06-03 NOTE — Telephone Encounter (Signed)
Last seen 07/11/11 and filled 05/09/12 #30. Please advise     KP

## 2012-06-05 ENCOUNTER — Other Ambulatory Visit: Payer: Self-pay | Admitting: Family Medicine

## 2012-06-08 ENCOUNTER — Other Ambulatory Visit: Payer: Self-pay | Admitting: Cardiology

## 2012-06-08 ENCOUNTER — Other Ambulatory Visit: Payer: Self-pay | Admitting: Family Medicine

## 2012-06-10 NOTE — Telephone Encounter (Signed)
Last seen 07/11/11 with no pending apt. please advise     KP

## 2012-06-14 ENCOUNTER — Other Ambulatory Visit: Payer: Self-pay | Admitting: General Practice

## 2012-06-14 ENCOUNTER — Telehealth: Payer: Self-pay | Admitting: General Practice

## 2012-06-14 DIAGNOSIS — L259 Unspecified contact dermatitis, unspecified cause: Secondary | ICD-10-CM

## 2012-06-14 MED ORDER — CLOTRIMAZOLE-BETAMETHASONE 1-0.05 % EX CREA: TOPICAL_CREAM | Freq: Two times a day (BID) | CUTANEOUS | Status: DC

## 2012-06-14 NOTE — Telephone Encounter (Signed)
Pt came into the office on 4/18 complaining of a rash on the lower portion of his leg. States it has been there for 2 weeks. Pt had been using his wife's Nystatin and Triamcinolone cream that was prescribed by [facility]. Per Dr. Etter Sjogren pt was given an rx for Lotrisone cream and told to have it reevaluated on 4/23 when Pt comes in for PT check.

## 2012-06-19 ENCOUNTER — Telehealth: Payer: Self-pay | Admitting: *Deleted

## 2012-06-19 ENCOUNTER — Ambulatory Visit (INDEPENDENT_AMBULATORY_CARE_PROVIDER_SITE_OTHER): Payer: Medicare Other | Admitting: *Deleted

## 2012-06-19 VITALS — BP 110/76 | HR 80 | Wt 217.0 lb

## 2012-06-19 DIAGNOSIS — L259 Unspecified contact dermatitis, unspecified cause: Secondary | ICD-10-CM

## 2012-06-19 DIAGNOSIS — I5023 Acute on chronic systolic (congestive) heart failure: Secondary | ICD-10-CM

## 2012-06-19 LAB — POCT INR: INR: 4.2

## 2012-06-19 MED ORDER — CLONAZEPAM 1 MG PO TABS: ORAL_TABLET | ORAL | Status: DC

## 2012-06-19 MED ORDER — CLOTRIMAZOLE-BETAMETHASONE 1-0.05 % EX CREA: TOPICAL_CREAM | Freq: Two times a day (BID) | CUTANEOUS | Status: DC

## 2012-06-19 NOTE — Telephone Encounter (Signed)
Ok to increase to #60

## 2012-06-19 NOTE — Progress Notes (Signed)
Pt rash on RT lower leg rechecked. Dermatitis has improved. Pt advised to make an appt due to bag on LT leg causing the same rash. Kris Hartmann, CMA at 06/19/12 3:52 PM

## 2012-06-19 NOTE — Telephone Encounter (Signed)
OV scheduled     KP

## 2012-06-19 NOTE — Telephone Encounter (Signed)
Pt came in for PT/INR check and requested that clonazepam quantity be increased to #60 instead of #30 for a month supply.Please advise if willing to increase quantity

## 2012-06-19 NOTE — Patient Instructions (Addendum)
Return to office in 1 weeks  New dosing: hold wed and thurs and then resume 2.5 mg daily  Please schedule office visit at next INR

## 2012-06-26 ENCOUNTER — Ambulatory Visit: Payer: Medicare Other | Admitting: Family Medicine

## 2012-06-27 ENCOUNTER — Encounter: Payer: Self-pay | Admitting: Lab

## 2012-06-28 ENCOUNTER — Ambulatory Visit: Payer: Medicare Other | Admitting: Family Medicine

## 2012-07-01 ENCOUNTER — Ambulatory Visit (INDEPENDENT_AMBULATORY_CARE_PROVIDER_SITE_OTHER): Payer: Medicare Other | Admitting: Family Medicine

## 2012-07-01 ENCOUNTER — Encounter: Payer: Self-pay | Admitting: Lab

## 2012-07-01 ENCOUNTER — Encounter: Payer: Self-pay | Admitting: Family Medicine

## 2012-07-01 VITALS — BP 92/64 | HR 91 | Temp 98.2°F | Wt 215.0 lb

## 2012-07-01 DIAGNOSIS — R5381 Other malaise: Secondary | ICD-10-CM | POA: Insufficient documentation

## 2012-07-01 DIAGNOSIS — I4891 Unspecified atrial fibrillation: Secondary | ICD-10-CM

## 2012-07-01 DIAGNOSIS — L0390 Cellulitis, unspecified: Secondary | ICD-10-CM

## 2012-07-01 DIAGNOSIS — I959 Hypotension, unspecified: Secondary | ICD-10-CM

## 2012-07-01 DIAGNOSIS — R5383 Other fatigue: Secondary | ICD-10-CM

## 2012-07-01 HISTORY — DX: Other malaise: R53.81

## 2012-07-01 LAB — POCT INR: INR: 4.6

## 2012-07-01 MED ORDER — LEVOFLOXACIN 500 MG PO TABS: 500.0000 mg | ORAL_TABLET | Freq: Every day | ORAL | Status: DC

## 2012-07-01 MED ORDER — CARVEDILOL 12.5 MG PO TABS: 12.5000 mg | ORAL_TABLET | Freq: Two times a day (BID) | ORAL | Status: DC

## 2012-07-01 NOTE — Assessment & Plan Note (Signed)
abx per orders Culture done rto friday

## 2012-07-01 NOTE — Assessment & Plan Note (Signed)
Check labs---r/0 anemia bp low as well coreg dose cut in half

## 2012-07-01 NOTE — Progress Notes (Signed)
  Subjective:    Patient ID: Gary [name]., male    DOB: [birth year], 79 y.o.   MRN: [id number]  HPI Pt here for coumadin check and c/o sores on R low leg x 1 week.  His wife also c/o him feeling tired and week x several days.  No bleeding per pt.  No fever. No calf pain.   Review of Systems As above    Objective:   Physical Exam  BP 92/64  Pulse 91  Temp(Src) 98.2 F (36.8 C) (Oral)  Wt 215 lb (97.523 kg)  BMI 27.59 kg/m2  SpO2 98% General appearance: alert, cooperative, appears stated age and mild distress---pt is pale Lungs: clear to auscultation bilaterally Heart: S1, S2 normal Extremities: + ulceration lat calf with yellow fluid , + errythema, no calf pain      Assessment & Plan:

## 2012-07-01 NOTE — Patient Instructions (Signed)
Cut your carvedilol in half so you will be taking 12.5 , 2x a day  Coumadin 2.5 mg ------  Hold for 2 days and take 1/2 tab M,W and 1 tab all other days ----recheck Friday     Cellulitis Cellulitis is an infection of the skin and the tissue beneath it. The infected area is usually red and tender. Cellulitis occurs most often in the arms and lower legs.  CAUSES  Cellulitis is caused by bacteria that enter the skin through cracks or cuts in the skin. The most common types of bacteria that cause cellulitis are Staphylococcus and Streptococcus. SYMPTOMS   Redness and warmth.  Swelling.  Tenderness or pain.  Fever. DIAGNOSIS  Your caregiver can usually determine what is wrong based on a physical exam. Blood tests may also be done. TREATMENT  Treatment usually involves taking an antibiotic medicine. HOME CARE INSTRUCTIONS   Take your antibiotics as directed. Finish them even if you start to feel better.  Keep the infected arm or leg elevated to reduce swelling.  Apply a warm cloth to the affected area up to 4 times per day to relieve pain.  Only take over-the-counter or prescription medicines for pain, discomfort, or fever as directed by your caregiver.  Keep all follow-up appointments as directed by your caregiver. SEEK MEDICAL CARE IF:   You notice red streaks coming from the infected area.  Your red area gets larger or turns dark in color.  Your bone or joint underneath the infected area becomes painful after the skin has healed.  Your infection returns in the same area or another area.  You notice a swollen bump in the infected area.  You develop new symptoms. SEEK IMMEDIATE MEDICAL CARE IF:   You have a fever.  You feel very sleepy.  You develop vomiting or diarrhea.  You have a general ill feeling (malaise) with muscle aches and pains. MAKE SURE YOU:   Understand these instructions.  Will watch your condition.  Will get help right away if you are not  doing well or get worse. Document Released: 11/23/2004 Document Revised: 08/15/2011 Document Reviewed: 05/01/2011 [facility] Patient Information 2013 [place].

## 2012-07-01 NOTE — Assessment & Plan Note (Signed)
Elevated PT/ Inr--- see instructions Recheck friday

## 2012-07-02 ENCOUNTER — Telehealth: Payer: Self-pay

## 2012-07-02 LAB — HEPATIC FUNCTION PANEL
Alkaline Phosphatase: 42 U/L (ref 39–117)
Bilirubin, Direct: 0.3 mg/dL (ref 0.0–0.3)
Total Bilirubin: 0.9 mg/dL (ref 0.3–1.2)
Total Protein: 6.7 g/dL (ref 6.0–8.3)

## 2012-07-02 LAB — CBC WITH DIFFERENTIAL/PLATELET
Basophils Relative: 0.4 % (ref 0.0–3.0)
Eosinophils Absolute: 0.1 10*3/uL (ref 0.0–0.7)
Lymphocytes Relative: 20.9 % (ref 12.0–46.0)
MCHC: 32.9 g/dL (ref 30.0–36.0)
Neutrophils Relative %: 68.5 % (ref 43.0–77.0)
RBC: 3.83 Mil/uL — ABNORMAL LOW (ref 4.22–5.81)
WBC: 3.8 10*3/uL — ABNORMAL LOW (ref 4.5–10.5)

## 2012-07-02 LAB — BASIC METABOLIC PANEL
CO2: 29 mEq/L (ref 19–32)
Calcium: 9.5 mg/dL (ref 8.4–10.5)
Creatinine, Ser: 3.7 mg/dL — ABNORMAL HIGH (ref 0.4–1.5)

## 2012-07-02 NOTE — Telephone Encounter (Signed)
See labs 

## 2012-07-02 NOTE — Telephone Encounter (Signed)
Spoke with patient's wife and made her aware of the results as well as Dr.Lowne's below recommendations, I will mail the stool cards to the patient and she agreed to have patient go to the ED if symptoms worsen. He has an apt scheduled on Friday for follow up.    KP

## 2012-07-02 NOTE — Telephone Encounter (Signed)
Pt dehydrated ---he needs to drink more fluids---he is also anemic--- take iron supplement Need pt to do cards If he feels worse--he needs to go to ER If better-- repeat labs thurs or friday

## 2012-07-02 NOTE — Telephone Encounter (Signed)
BUN 89 (H), value should be less than 85 Message printed off and placed on ledge for quicker review

## 2012-07-04 ENCOUNTER — Encounter: Payer: Self-pay | Admitting: Lab

## 2012-07-05 ENCOUNTER — Ambulatory Visit: Payer: Medicare Other | Admitting: Family Medicine

## 2012-07-05 LAB — WOUND CULTURE: Gram Stain: NONE SEEN

## 2012-07-08 ENCOUNTER — Encounter: Payer: Self-pay | Admitting: Lab

## 2012-07-09 ENCOUNTER — Encounter: Payer: Self-pay | Admitting: Family Medicine

## 2012-07-09 ENCOUNTER — Ambulatory Visit (INDEPENDENT_AMBULATORY_CARE_PROVIDER_SITE_OTHER): Payer: Medicare Other | Admitting: Family Medicine

## 2012-07-09 VITALS — BP 100/60 | HR 71 | Temp 97.8°F | Wt 218.4 lb

## 2012-07-09 DIAGNOSIS — I4891 Unspecified atrial fibrillation: Secondary | ICD-10-CM

## 2012-07-09 DIAGNOSIS — I10 Essential (primary) hypertension: Secondary | ICD-10-CM

## 2012-07-09 DIAGNOSIS — R5383 Other fatigue: Secondary | ICD-10-CM

## 2012-07-09 DIAGNOSIS — L0390 Cellulitis, unspecified: Secondary | ICD-10-CM

## 2012-07-09 DIAGNOSIS — D649 Anemia, unspecified: Secondary | ICD-10-CM

## 2012-07-09 LAB — POCT INR: INR: 1.8

## 2012-07-09 MED ORDER — LEVOFLOXACIN 500 MG PO TABS: 500.0000 mg | ORAL_TABLET | Freq: Every day | ORAL | Status: DC

## 2012-07-09 NOTE — Patient Instructions (Signed)
Coumadin--  2.5 mg tab---- take 1/2 tab MWF and 1 tab all other days  Recheck 1 week

## 2012-07-10 ENCOUNTER — Encounter: Payer: Self-pay | Admitting: Family Medicine

## 2012-07-10 NOTE — Assessment & Plan Note (Signed)
Pt anemic--- recheck labs today

## 2012-07-10 NOTE — Assessment & Plan Note (Signed)
Home health referral in for wound care [facility] for rides to dr offices

## 2012-07-10 NOTE — Assessment & Plan Note (Addendum)
Has been running low---better now

## 2012-07-10 NOTE — Assessment & Plan Note (Signed)
Low INR today Pt taking 1/2 tab of 2.5 mg daily------ now 1 tab MWF and 1/2 other days Recheck 1 week

## 2012-07-10 NOTE — Progress Notes (Signed)
  Subjective:    Patient ID: Gary Rolls., male    DOB: 03-23-1929, 79 y.o.   MRN: [id number]  HPI Pt here f/u cellulitis R low leg and to check coumadin and recheck cbcd.   Pt states he is feeling better.  It is difficult for him to drive so they are still requesting thn help and home health.     Review of Systems    as above Objective:   Physical Exam  BP 100/60  Pulse 71  Temp(Src) 97.8 F (36.6 C) (Oral)  Wt 218 lb 6.4 oz (99.066 kg)  BMI 28.03 kg/m2  SpO2 98% General appearance: alert, cooperative, appears stated age and no distress Extremities: + errythema although better ,  ulceration lat leg still oozing yellow fluid      Assessment & Plan:

## 2012-07-12 ENCOUNTER — Encounter: Payer: Medicare Other | Admitting: Family Medicine

## 2012-07-17 ENCOUNTER — Telehealth: Payer: Self-pay | Admitting: Family Medicine

## 2012-07-17 NOTE — Telephone Encounter (Signed)
Has home health been out to see wound--- he may not need anything else.

## 2012-07-17 NOTE — Telephone Encounter (Signed)
Patient called stating he is on day 14 of Levaquin and it makes him very nauseous, even with the zofran. He is taking this at night with a meal. Patient would like to know if he should continue taking it. Pt uses [facility] on [address]

## 2012-07-18 DIAGNOSIS — L03119 Cellulitis of unspecified part of limb: Secondary | ICD-10-CM

## 2012-07-18 DIAGNOSIS — I509 Heart failure, unspecified: Secondary | ICD-10-CM

## 2012-07-18 DIAGNOSIS — I10 Essential (primary) hypertension: Secondary | ICD-10-CM

## 2012-07-18 DIAGNOSIS — I4891 Unspecified atrial fibrillation: Secondary | ICD-10-CM

## 2012-07-18 DIAGNOSIS — L02419 Cutaneous abscess of limb, unspecified: Secondary | ICD-10-CM

## 2012-07-18 MED ORDER — COLLAGENASE 250 UNIT/GM EX OINT: TOPICAL_OINTMENT | Freq: Every day | CUTANEOUS | Status: DC

## 2012-07-18 NOTE — Telephone Encounter (Signed)
Call from [place] [phone]) at [facility]. She went out to see the patient today and he is still having some bilateral edema but the Left leg has improved, No oozing just scabbing. He has a streak of redness on the front side of the leg. All he needs is a compression stocking. On the right leg was worst. He had several open places that were oozing,all are scabbed besides one quarter sized area that needs debriding due to dead skin tissue, minimal redness but no infection. He will need an Rx for Santyl for the leg sent to the pharmacy so when she goes back on Tuesday she can continue to care for that area. Please advise     KP

## 2012-07-18 NOTE — Telephone Encounter (Signed)
Ok to call in rx---  As directed

## 2012-07-18 NOTE — Telephone Encounter (Signed)
Patient has been made aware that the Rx was sent.      KP

## 2012-07-18 NOTE — Telephone Encounter (Signed)
Spoke with patient and he voiced understanding. Home health is coming out today and he will have them call with the progress of his wound.      KP

## 2012-07-18 NOTE — Addendum Note (Signed)
Addended by: Ewing Schlein on: 07/18/2012 03:57 PM   Modules accepted: Orders

## 2012-07-23 ENCOUNTER — Other Ambulatory Visit: Payer: Self-pay | Admitting: Family Medicine

## 2012-07-23 NOTE — Telephone Encounter (Signed)
Klonopin Refill Last OV 07-09-2012 Med last filled 06-19-12 #60 with 0 refills.

## 2012-07-25 ENCOUNTER — Ambulatory Visit (INDEPENDENT_AMBULATORY_CARE_PROVIDER_SITE_OTHER): Payer: Medicare Other | Admitting: General Practice

## 2012-07-25 VITALS — BP 124/82 | HR 78 | Temp 98.1°F | Resp 16 | Wt 219.0 lb

## 2012-07-25 DIAGNOSIS — I4891 Unspecified atrial fibrillation: Secondary | ICD-10-CM

## 2012-07-25 DIAGNOSIS — Z7901 Long term (current) use of anticoagulants: Secondary | ICD-10-CM

## 2012-07-25 LAB — POCT INR: INR: 2.9

## 2012-07-25 NOTE — Patient Instructions (Signed)
New Dose: 2.5 mg every day except 1.25mg  on M, W, F  Return in : 2 weeks

## 2012-07-29 ENCOUNTER — Other Ambulatory Visit: Payer: Self-pay | Admitting: Physician Assistant

## 2012-08-07 ENCOUNTER — Other Ambulatory Visit: Payer: Self-pay | Admitting: Family Medicine

## 2012-08-21 ENCOUNTER — Telehealth: Payer: Self-pay | Admitting: Family Medicine

## 2012-08-21 NOTE — Telephone Encounter (Signed)
Please advise      KP 

## 2012-08-21 NOTE — Telephone Encounter (Signed)
Ok to do that but they need to call in results because it takes a few days to get fax results

## 2012-08-21 NOTE — Telephone Encounter (Signed)
Adriane, patient's home care nurse is calling for verbal authorization to increase the patient's home care visits for treatment of his cellulitis in his leg.

## 2012-08-21 NOTE — Telephone Encounter (Signed)
Ok to increase home visits

## 2012-08-21 NOTE — Telephone Encounter (Signed)
Patient is calling to request that we call [facility] to authorize them to be able to do his PT check when they come to do his bandages. Patient states that his son is having to drive from [place] to bring him here for his doctors appointments because he is no longer able to drive. He would like to know if we can make this easier for him and his family by [place] to check his levels. Patient asks that we call Janie at [facility] Phn#: [phone].

## 2012-08-22 ENCOUNTER — Telehealth: Payer: Self-pay | Admitting: Family Medicine

## 2012-08-22 NOTE — Telephone Encounter (Signed)
INR Results: 1.8 and 21.3 and patient is taking 2.5mg  a day and on M, W, F he is taking 1.25mg .

## 2012-08-22 NOTE — Telephone Encounter (Signed)
Please advise      KP 

## 2012-08-22 NOTE — Telephone Encounter (Signed)
Pt states that he is unable to come in to office due to transportation issue. Pt notes that he is unable to drive and his son is out of town. Pt indicated that a taxi is out the question because it cost $25. .Please advise    Pt aware of new coumadin instruction.

## 2012-08-22 NOTE — Telephone Encounter (Signed)
Order given to [place] and she will do the INR as long as she is going to the home for the wound care, once the wound care stops the patient will need to come back out to the office. She has also agreed to call the office with the INR results.    KP

## 2012-08-22 NOTE — Telephone Encounter (Signed)
Can we get home health--[facility]?

## 2012-08-22 NOTE — Telephone Encounter (Signed)
Take 2.5 everyday except  1.25 on Friday-----take extra 1.25 tonight, recheck next week

## 2012-08-22 NOTE — Telephone Encounter (Signed)
Order given to [place] and she will do the INR as long as she is going to the home for the wound care, once the wound care stops the patient will need to come back out to the office. She has also agreed to call the office with the INR results.    KP

## 2012-08-23 ENCOUNTER — Telehealth: Payer: Self-pay | Admitting: Family Medicine

## 2012-08-23 ENCOUNTER — Ambulatory Visit: Payer: Medicare Other

## 2012-08-23 DIAGNOSIS — I4891 Unspecified atrial fibrillation: Secondary | ICD-10-CM

## 2012-08-23 DIAGNOSIS — Z7901 Long term (current) use of anticoagulants: Secondary | ICD-10-CM

## 2012-08-23 NOTE — Telephone Encounter (Signed)
I have already done the [facility] referral and I gave the patient the name of his social worker a few weeks ago.      KP

## 2012-08-23 NOTE — Telephone Encounter (Signed)
Becky from [place] is calling to find out when the patient needs to have his next PT INR. She says that they are going out to see the him on 08/29/12 for his wound care and can add the orders to that visit if we send them.

## 2012-08-23 NOTE — Telephone Encounter (Addendum)
Per previous note Take 2.5 everyday except 1.25 on Friday-----take extra 1.25 tonight, recheck next week

## 2012-08-23 NOTE — Telephone Encounter (Signed)
Orders faxed      KP

## 2012-08-28 ENCOUNTER — Telehealth: Payer: Self-pay | Admitting: Family Medicine

## 2012-08-28 NOTE — Telephone Encounter (Signed)
Adrienne from [facility] called stating she is going to do the patient's PT/INR on Monday because we are closed on Friday and needs an order for this. CB# [phone]

## 2012-08-28 NOTE — Telephone Encounter (Signed)
Gary [name] will call tomorrow with the fax number     KP

## 2012-09-02 ENCOUNTER — Telehealth: Payer: Self-pay | Admitting: Family Medicine

## 2012-09-02 DIAGNOSIS — N19 Unspecified kidney failure: Secondary | ICD-10-CM

## 2012-09-02 DIAGNOSIS — I4891 Unspecified atrial fibrillation: Secondary | ICD-10-CM

## 2012-09-02 NOTE — Telephone Encounter (Signed)
Patient called stating his PT/INR results were 1.7 and 20.2. Patient is currently taking warfarin 2.5 mg every day except Friday he takes 1.25 mg. Please advise.

## 2012-09-02 NOTE — Telephone Encounter (Signed)
PT/INR is correct per [facility] with [facility]. (1.7 and 20.2) Please advise dosing. GF/RN

## 2012-09-02 NOTE — Telephone Encounter (Signed)
Take 2.5 every day--- take extra 1/2 tab today only Recheck 1 week

## 2012-09-02 NOTE — Telephone Encounter (Signed)
Beth from [facility] returned your call regarding this patient's PT/INR results. CB# [phone].

## 2012-09-02 NOTE — Telephone Encounter (Signed)
Call from [place] with [place] and she goes out to the home to see the patient yearly. She stated the patient is unable to drive anymore and he will need to have a home health nurse come out to the home every 8 weeks to have a foley cath changed and the patient needs to have PT machine to do PT checks at home. Per Dr.Lowne it is ok to order home health for his foley Cath change and to try to spoke with the rep to ensure home PT checks will be accurate.   I discussed INR with patient and he voiced understanding. He agreed to the above recommendations. Due to change Foley Cath on 10/11/12 and he will need INR check before the Cath can be changed per patient.     KP

## 2012-09-04 NOTE — Telephone Encounter (Signed)
Discussed INR equipment with Dawn at [facility] and she stated the INR machine was accurate and the patient will check his INR levels weekly due to the INR's being done at home. They will send a monthly report and if any of the INR's are abnormal. Dr.Lowne is ok with this and the order has been faxed along with the patients insurance info.    KP

## 2012-09-05 ENCOUNTER — Telehealth: Payer: Self-pay | Admitting: Family Medicine

## 2012-09-05 NOTE — Telephone Encounter (Signed)
Beth from [facility] called stating they cannot accept the referral for this patient because they are not currently taking his insurance. Beth's CB# is [phone]

## 2012-09-05 NOTE — Telephone Encounter (Signed)
Referral was sent to [facility] because the patient is already receiving care. I have already discussed with Buzz at [facility].      Kp

## 2012-09-09 ENCOUNTER — Other Ambulatory Visit: Payer: Self-pay | Admitting: Family Medicine

## 2012-09-09 ENCOUNTER — Telehealth: Payer: Self-pay | Admitting: Family Medicine

## 2012-09-09 NOTE — Telephone Encounter (Signed)
Patient called to report his results. Pt was 1.9 and inr was 22.8. Pt is taking 2.5 mg warfarin daily. Please advise.

## 2012-09-09 NOTE — Telephone Encounter (Signed)
Please advise. GF/RN

## 2012-09-09 NOTE — Telephone Encounter (Signed)
Take 5 mg Tues and 2.5 all other days---  Recheck 1 week

## 2012-09-10 NOTE — Telephone Encounter (Signed)
Patient has been made aware and voiced understanding. He also stated he will have his cath changed tomorrow instead of August 15, he said this is so convenient for him and he appreciates all that we have done to help him.      KP

## 2012-09-16 ENCOUNTER — Telehealth: Payer: Self-pay | Admitting: Family Medicine

## 2012-09-16 ENCOUNTER — Other Ambulatory Visit: Payer: Self-pay | Admitting: Family Medicine

## 2012-09-16 MED ORDER — LIDOCAINE HCL 2 % EX GEL: CUTANEOUS | Status: DC | PRN

## 2012-09-16 NOTE — Telephone Encounter (Signed)
[facility] pharmacy called stating the patient is requesting lidocaine gel to use with his urinary catheter. Please send rx to [place]

## 2012-09-16 NOTE — Telephone Encounter (Signed)
Please advise      KP 

## 2012-09-16 NOTE — Telephone Encounter (Signed)
Patient is calling in regards to his rx for Lidocaine. He says that [place] is coming in the morning to change out his catheter and he needs in at the pharmacy by this afternoon so that his son can pick it up for him.

## 2012-09-16 NOTE — Telephone Encounter (Signed)
It was sent-- i think urology normally rx that for him though

## 2012-09-17 ENCOUNTER — Telehealth: Payer: Self-pay | Admitting: Family Medicine

## 2012-09-17 NOTE — Telephone Encounter (Signed)
patient has been made aware and voiced understanding.      KP

## 2012-09-17 NOTE — Telephone Encounter (Signed)
Patient is calling to let Dr. Etter Sjogren know that his PT was 2.4 today and INR was 29.3.

## 2012-09-17 NOTE — Telephone Encounter (Signed)
Previous INR was 1.9 on 09/09/12.  He took 5 mg on Tues and 2.5 all other days and recheck in 1 week. Please advise      KP

## 2012-09-17 NOTE — Telephone Encounter (Signed)
Perfect--- cont current dosing and recheck 2  weeks

## 2012-09-25 ENCOUNTER — Telehealth: Payer: Self-pay

## 2012-09-25 NOTE — Telephone Encounter (Signed)
[facility] called to advise that the patient had been using una boots and moved down to compression socks. Nurse states that patient has water like blisters under the socks. Skin temp meter does not show variations in temp on the calves. Per Dr. Birdie Riddle, put una boots back on due to compression socks pressing against excess swelling. Advised to have the patient call if he has concerns or there are any changes.

## 2012-09-28 ENCOUNTER — Other Ambulatory Visit: Payer: Self-pay | Admitting: Physician Assistant

## 2012-09-30 ENCOUNTER — Telehealth: Payer: Self-pay

## 2012-09-30 NOTE — Telephone Encounter (Signed)
Call from [place] at [facility] and she stated the patient's INR was 1.4. He is taking 2.5 mg daily-- He missed 2 days due to increased bleeding and bruising. Please advise on new directions.       KP

## 2012-09-30 NOTE — Telephone Encounter (Signed)
Increase to 5mg  today and tomorrow and then resume 2.5 mg daily.  Repeat INR in 2 weeks.

## 2012-09-30 NOTE — Telephone Encounter (Signed)
Patient has been made aware and voiced understanding. Directions has been faxed to [facility] at [place]

## 2012-10-01 ENCOUNTER — Telehealth: Payer: Self-pay | Admitting: Family Medicine

## 2012-10-01 NOTE — Telephone Encounter (Signed)
Becky with [facility] is calling to discuss Re-certification  (because they are "starting over") and referral needs for the patient. Did not want to elaborate. Please follow-up.  Ph#: [phone]

## 2012-10-02 NOTE — Telephone Encounter (Signed)
Spoke with Jacqlyn Larsen and she stated they had a glitch with Mr. Arboriculturist and they will need a new referral and a new face to face.  I advised i would try to get the patient transportation setup and then I would let him know about the visit. Msg left for [facility] social worker for a return call.        KP

## 2012-10-03 DIAGNOSIS — I10 Essential (primary) hypertension: Secondary | ICD-10-CM

## 2012-10-03 DIAGNOSIS — R339 Retention of urine, unspecified: Secondary | ICD-10-CM

## 2012-10-03 DIAGNOSIS — I4891 Unspecified atrial fibrillation: Secondary | ICD-10-CM

## 2012-10-03 DIAGNOSIS — Z466 Encounter for fitting and adjustment of urinary device: Secondary | ICD-10-CM

## 2012-10-07 NOTE — Telephone Encounter (Signed)
Msg from [place] (Education officer, museum) advising she can set up transportation for the patient. I left a detailed msg advising the patient has an apt on 10/10/09 at 11 am with Dr.Lowne. I advised on the VM to call me back with any question or concerns.       KP

## 2012-10-09 ENCOUNTER — Ambulatory Visit: Payer: Medicare Other

## 2012-10-10 ENCOUNTER — Ambulatory Visit (INDEPENDENT_AMBULATORY_CARE_PROVIDER_SITE_OTHER): Payer: Medicare Other | Admitting: Family Medicine

## 2012-10-10 ENCOUNTER — Encounter: Payer: Self-pay | Admitting: Family Medicine

## 2012-10-10 VITALS — BP 114/60 | HR 94 | Temp 97.8°F | Wt 198.2 lb

## 2012-10-10 DIAGNOSIS — L0291 Cutaneous abscess, unspecified: Secondary | ICD-10-CM

## 2012-10-10 DIAGNOSIS — I4891 Unspecified atrial fibrillation: Secondary | ICD-10-CM

## 2012-10-10 DIAGNOSIS — D649 Anemia, unspecified: Secondary | ICD-10-CM

## 2012-10-10 DIAGNOSIS — L0390 Cellulitis, unspecified: Secondary | ICD-10-CM

## 2012-10-10 DIAGNOSIS — B86 Scabies: Secondary | ICD-10-CM

## 2012-10-10 MED ORDER — PERMETHRIN 5 % EX CREA: TOPICAL_CREAM | CUTANEOUS | Status: DC

## 2012-10-10 MED ORDER — NYSTATIN 100000 UNIT/GM EX POWD: CUTANEOUS | Status: DC

## 2012-10-10 MED ORDER — PERMETHRIN 5 % EX CREA: TOPICAL_CREAM | Freq: Once | CUTANEOUS | Status: DC

## 2012-10-10 NOTE — Progress Notes (Signed)
  Subjective:     Gary [name]. is a 79 y.o. male who presents for evaluation of a rash involving the lumbar region, thigh and buttocks. Rash started several weeks ago. Lesions are pink, and raised in texture. Rash has changed over time. Rash is pruritic. Associated symptoms: none. Patient denies: abdominal pain, arthralgia, congestion, cough, crankiness, decrease in appetite, decrease in energy level, fever, headache, irritability, myalgia, nausea, sore throat and vomiting. Patient has had contacts with similar rash. Patient has not had new exposures (soaps, lotions, laundry detergents, foods, medications, plants, insects or animals). Pt also has red blotches on R thigh. He also need a pt check.  The following portions of the patient's history were reviewed and updated as appropriate: allergies, current medications, past family history, past medical history, past social history, past surgical history and problem list.  Review of Systems Pertinent items are noted in HPI.    Objective:    BP 114/60  Pulse 94  Temp(Src) 97.8 F (36.6 C) (Oral)  Wt 198 lb 3.2 oz (89.903 kg)  BMI 25.44 kg/m2  SpO2 96% General:  alert, cooperative, appears stated age and no distress  Skin:  erythema noted on extremities and rash noted on extremities   --+ circular lesions on r thigh and skin on low back and buttocks dry with escoriations  Assessment:    scabies    Plan:    Medications: permethrin. Written patient instruction given. Follow up in a few weeks.

## 2012-10-10 NOTE — Assessment & Plan Note (Addendum)
Pt INr-- 2.5 mg tabs----take 2 1day a week then 1 po qd

## 2012-10-10 NOTE — Patient Instructions (Addendum)
Scabies  Scabies are small bugs (mites) that burrow under the skin and cause red bumps and severe itching. These bugs can only be seen with a microscope. Scabies are highly contagious. They can spread easily from person to person by direct contact. They are also spread through sharing clothing or linens that have the scabies mites living in them. It is not unusual for an entire family to become infected through shared towels, clothing, or bedding.   HOME CARE INSTRUCTIONS   · Your caregiver may prescribe a cream or lotion to kill the mites. If cream is prescribed, massage the cream into the entire body from the neck to the bottom of both feet. Also massage the cream into the scalp and face if your child is less than 1 year old. Avoid the eyes and mouth. Do not wash your hands after application.  · Leave the cream on for 8 to 12 hours. Your child should bathe or shower after the 8 to 12 hour application period. Sometimes it is helpful to apply the cream to your child right before bedtime.  · One treatment is usually effective and will eliminate approximately 95% of infestations. For severe cases, your caregiver may decide to repeat the treatment in 1 week. Everyone in your household should be treated with one application of the cream.  · New rashes or burrows should not appear within 24 to 48 hours after successful treatment. However, the itching and rash may last for 2 to 4 weeks after successful treatment. Your caregiver may prescribe a medicine to help with the itching or to help the rash go away more quickly.  · Scabies can live on clothing or linens for up to 3 days. All of your child's recently used clothing, towels, stuffed toys, and bed linens should be washed in hot water and then dried in a dryer for at least 20 minutes on high heat. Items that cannot be washed should be enclosed in a plastic bag for at least 3 days.  · To help relieve itching, bathe your child in a cool bath or apply cool washcloths to the  affected areas.  · Your child may return to school after treatment with the prescribed cream.  SEEK MEDICAL CARE IF:   · The itching persists longer than 4 weeks after treatment.  · The rash spreads or becomes infected. Signs of infection include red blisters or yellow-tan crust.  Document Released: 02/13/2005 Document Revised: 05/08/2011 Document Reviewed: 06/24/2008  [employer]® Patient Information ©2014 [employer].

## 2012-10-11 ENCOUNTER — Telehealth: Payer: Self-pay | Admitting: Family Medicine

## 2012-10-11 NOTE — Telephone Encounter (Signed)
Patient called stating the directions on his Gary [name] are wrong. He states he takes up to 4 pills daily, depending on how many spasms he is having. He would like directions to reflect this.

## 2012-10-14 NOTE — Telephone Encounter (Signed)
Medication list updated to reflect 4 daily. We do not prescribe RX.      KP

## 2012-10-14 NOTE — Telephone Encounter (Signed)
Have we been rx that?  I think that was from urology.

## 2012-10-14 NOTE — Telephone Encounter (Signed)
Please advise      KP 

## 2012-10-15 DIAGNOSIS — I872 Venous insufficiency (chronic) (peripheral): Secondary | ICD-10-CM

## 2012-10-15 DIAGNOSIS — R339 Retention of urine, unspecified: Secondary | ICD-10-CM

## 2012-10-15 DIAGNOSIS — Z466 Encounter for fitting and adjustment of urinary device: Secondary | ICD-10-CM

## 2012-10-15 DIAGNOSIS — L97809 Non-pressure chronic ulcer of other part of unspecified lower leg with unspecified severity: Secondary | ICD-10-CM

## 2012-10-23 ENCOUNTER — Telehealth: Payer: Self-pay | Admitting: Family Medicine

## 2012-10-23 DIAGNOSIS — B86 Scabies: Secondary | ICD-10-CM

## 2012-10-23 MED ORDER — PERMETHRIN 5 % EX CREA: TOPICAL_CREAM | CUTANEOUS | Status: DC

## 2012-10-23 NOTE — Telephone Encounter (Signed)
Spoke with patient and we discussed medications. Patient is still having itching but he did not completely treat himself for the scabies. I advised the patient to apply the Elimite from neck to toe and leave on his skin until tomorrow morning a then shower off. The patient voiced understanding and agreed to do so. He will call if he has any problems or concerns.  A new Rx was sent per Dr.Tabori    KP

## 2012-10-23 NOTE — Telephone Encounter (Signed)
Patient is calling in regards to his prescriptions: nystatin (MYCOSTATIN/NYSTOP) 100000 UNIT/GM POWD and permethrin (ELIMITE) 5 % cream. He called the pharmacy to refill these medications for his Scabies but they told him to call our office because it is too early to refill. Patient says he is putting the ointment all over his body and the powder on his buttocks only. He is out of both and wants to know if he can get a larger quantity so he will not have to refill so often. Please advise.

## 2012-10-31 ENCOUNTER — Other Ambulatory Visit: Payer: Self-pay | Admitting: Family Medicine

## 2012-11-02 ENCOUNTER — Other Ambulatory Visit: Payer: Self-pay | Admitting: Family Medicine

## 2012-11-08 ENCOUNTER — Other Ambulatory Visit: Payer: Self-pay | Admitting: Family Medicine

## 2012-11-08 NOTE — Telephone Encounter (Signed)
Last seen 10/10/12 and filled 07/23/12 #60. please advise     KP

## 2012-11-13 ENCOUNTER — Telehealth: Payer: Self-pay

## 2012-11-13 NOTE — Telephone Encounter (Signed)
Spoke with patient and he stated he is still itching, he stated the Nystop comes in such a small bottle and he is using 1 tube per every 2 days. He said the Nystop does stop the itching but would like a larger quantity or would like something stronger.    KP    If he needs an apt and transportation is need we will need to call social worker at [employer].       KP

## 2012-11-14 ENCOUNTER — Other Ambulatory Visit: Payer: Self-pay | Admitting: *Deleted

## 2012-11-14 ENCOUNTER — Other Ambulatory Visit: Payer: Self-pay | Admitting: Family Medicine

## 2012-11-14 MED ORDER — NYSTATIN 100000 UNIT/GM EX POWD: CUTANEOUS | Status: DC

## 2012-11-14 NOTE — Telephone Encounter (Signed)
Lvm for pt letting him know that Dr Etter Sjogren called in an rx for him.

## 2012-11-14 NOTE — Telephone Encounter (Signed)
Sent to pharm ....

## 2012-11-15 NOTE — Telephone Encounter (Signed)
Msg left advising the patient the Medication.       KP

## 2012-11-28 ENCOUNTER — Other Ambulatory Visit: Payer: Self-pay

## 2012-11-28 DIAGNOSIS — N19 Unspecified kidney failure: Secondary | ICD-10-CM

## 2012-11-28 DIAGNOSIS — I4891 Unspecified atrial fibrillation: Secondary | ICD-10-CM

## 2012-11-29 NOTE — Progress Notes (Signed)
Patient sent in a letter requesting extended services from [facility]. Ok per [employer]. Orders faxed     KP

## 2012-12-05 ENCOUNTER — Ambulatory Visit (INDEPENDENT_AMBULATORY_CARE_PROVIDER_SITE_OTHER): Payer: Medicare Other | Admitting: Family Medicine

## 2012-12-05 ENCOUNTER — Encounter: Payer: Self-pay | Admitting: Family Medicine

## 2012-12-05 VITALS — BP 108/60 | HR 69 | Temp 97.5°F | Wt 195.0 lb

## 2012-12-05 DIAGNOSIS — R21 Rash and other nonspecific skin eruption: Secondary | ICD-10-CM

## 2012-12-05 DIAGNOSIS — L0390 Cellulitis, unspecified: Secondary | ICD-10-CM

## 2012-12-05 DIAGNOSIS — Z23 Encounter for immunization: Secondary | ICD-10-CM

## 2012-12-05 DIAGNOSIS — L0291 Cutaneous abscess, unspecified: Secondary | ICD-10-CM

## 2012-12-05 DIAGNOSIS — I4891 Unspecified atrial fibrillation: Secondary | ICD-10-CM

## 2012-12-05 MED ORDER — CLOTRIMAZOLE-BETAMETHASONE 1-0.05 % EX CREA: TOPICAL_CREAM | Freq: Two times a day (BID) | CUTANEOUS | Status: DC

## 2012-12-05 MED ORDER — CEPHALEXIN 500 MG PO CAPS: 500.0000 mg | ORAL_CAPSULE | Freq: Two times a day (BID) | ORAL | Status: DC

## 2012-12-05 NOTE — Patient Instructions (Signed)
Cellulitis Cellulitis is an infection of the skin and the tissue beneath it. The infected area is usually red and tender. Cellulitis occurs most often in the arms and lower legs.  CAUSES  Cellulitis is caused by bacteria that enter the skin through cracks or cuts in the skin. The most common types of bacteria that cause cellulitis are Staphylococcus and Streptococcus. SYMPTOMS   Redness and warmth.  Swelling.  Tenderness or pain.  Fever. DIAGNOSIS  Your caregiver can usually determine what is wrong based on a physical exam. Blood tests may also be done. TREATMENT  Treatment usually involves taking an antibiotic medicine. HOME CARE INSTRUCTIONS   Take your antibiotics as directed. Finish them even if you start to feel better.  Keep the infected arm or leg elevated to reduce swelling.  Apply a warm cloth to the affected area up to 4 times per day to relieve pain.  Only take over-the-counter or prescription medicines for pain, discomfort, or fever as directed by your caregiver.  Keep all follow-up appointments as directed by your caregiver. SEEK MEDICAL CARE IF:   You notice red streaks coming from the infected area.  Your red area gets larger or turns dark in color.  Your bone or joint underneath the infected area becomes painful after the skin has healed.  Your infection returns in the same area or another area.  You notice a swollen bump in the infected area.  You develop new symptoms. SEEK IMMEDIATE MEDICAL CARE IF:   You have a fever.  You feel very sleepy.  You develop vomiting or diarrhea.  You have a general ill feeling (malaise) with muscle aches and pains. MAKE SURE YOU:   Understand these instructions.  Will watch your condition.  Will get help right away if you are not doing well or get worse. Document Released: 11/23/2004 Document Revised: 08/15/2011 Document Reviewed: 05/01/2011 ExitCare Patient Information 2014 [employer].  

## 2012-12-05 NOTE — Progress Notes (Signed)
  Subjective:     Gary PURPLE Sr. is a 79 y.o. male who presents for evaluation of a rash involving the lumbar region and groin. Rash started several days ago. Lesions are pink, and flat in texture. Rash has changed over time. Rash is pruritic. Associated symptoms: none. Patient denies: abdominal pain, arthralgia, congestion, cough, crankiness, decrease in appetite, decrease in energy level, fever, headache, irritability, myalgia, nausea, sore throat and vomiting. Patient has not had contacts with similar rash. Patient has not had new exposures (soaps, lotions, laundry detergents, foods, medications, plants, insects or animals).  THe patient also c/o reddness of RLE and hot to touch.  No calf pain.  No sob/cp.   The following portions of the patient's history were reviewed and updated as appropriate: allergies, current medications, past family history, past medical history, past social history, past surgical history and problem list.  Review of Systems Pertinent items are noted in HPI.    Objective:    BP 108/60  Pulse 69  Temp(Src) 97.5 F (36.4 C) (Oral)  Wt 195 lb (88.451 kg)  BMI 25.03 kg/m2  SpO2 97% General:  alert, cooperative, appears stated age and no distress  Skin:  erythema noted on low back , buttocks and groin    ext-- R LE + errythema and swelling ,  Warm to touch   Assessment:    rash--? etiology  ---- though it was tinea originally but ? Scabies although no one else in house has rash and pt has used permethrin 2x   Plan:     lotrisone bid  Refer to derm if no releif

## 2012-12-05 NOTE — Assessment & Plan Note (Signed)
abx per meds and orders rto prn

## 2012-12-09 ENCOUNTER — Telehealth: Payer: Self-pay

## 2012-12-09 MED ORDER — NYSTATIN-TRIAMCINOLONE 100000-0.1 UNIT/GM-% EX OINT: TOPICAL_OINTMENT | Freq: Two times a day (BID) | CUTANEOUS | Status: DC

## 2012-12-09 NOTE — Telephone Encounter (Signed)
lotrisone cream tid prn 60 g

## 2012-12-09 NOTE — Telephone Encounter (Signed)
Rx faxed for Mycolog and it is ok per Dr.Lowne she said it is the generic    KP

## 2012-12-09 NOTE — Telephone Encounter (Signed)
Call from the patient who is requesting the Nystatin-Triamcinolone cream and not the powder. Please advise      KP

## 2012-12-12 ENCOUNTER — Other Ambulatory Visit: Payer: Self-pay | Admitting: Cardiology

## 2012-12-12 ENCOUNTER — Other Ambulatory Visit: Payer: Self-pay | Admitting: Family Medicine

## 2012-12-16 DIAGNOSIS — I4891 Unspecified atrial fibrillation: Secondary | ICD-10-CM

## 2012-12-16 DIAGNOSIS — I509 Heart failure, unspecified: Secondary | ICD-10-CM

## 2012-12-16 DIAGNOSIS — R339 Retention of urine, unspecified: Secondary | ICD-10-CM

## 2012-12-16 DIAGNOSIS — Z466 Encounter for fitting and adjustment of urinary device: Secondary | ICD-10-CM

## 2012-12-26 ENCOUNTER — Telehealth: Payer: Self-pay

## 2012-12-26 DIAGNOSIS — R21 Rash and other nonspecific skin eruption: Secondary | ICD-10-CM

## 2012-12-26 DIAGNOSIS — B86 Scabies: Secondary | ICD-10-CM

## 2012-12-26 NOTE — Telephone Encounter (Signed)
Detailed message left advising that we will be referring to the dermatologist and to call if any concerns.     KP

## 2012-12-26 NOTE — Telephone Encounter (Signed)
How many times has he used it?  It should be gone with 2 treatments.  We should refer him to a dermatologist.

## 2012-12-26 NOTE — Telephone Encounter (Signed)
MSG from patient who stated he was still itching in the Hip area and the inner thigh and would like another prescription for the permethrin cream because he does not feel like the scabies is completely gone.  Please advise      KP

## 2013-01-03 ENCOUNTER — Other Ambulatory Visit: Payer: Self-pay | Admitting: Family Medicine

## 2013-01-03 NOTE — Telephone Encounter (Signed)
Last seen 12/05/12 and filled 11/08/12 #60. Please advise      KP

## 2013-01-15 ENCOUNTER — Ambulatory Visit (INDEPENDENT_AMBULATORY_CARE_PROVIDER_SITE_OTHER): Payer: Medicare Other | Admitting: Cardiology

## 2013-01-15 ENCOUNTER — Encounter: Payer: Self-pay | Admitting: Cardiology

## 2013-01-15 VITALS — BP 110/60 | HR 66 | Ht 72.0 in | Wt 206.0 lb

## 2013-01-15 DIAGNOSIS — I10 Essential (primary) hypertension: Secondary | ICD-10-CM

## 2013-01-15 DIAGNOSIS — I2510 Atherosclerotic heart disease of native coronary artery without angina pectoris: Secondary | ICD-10-CM

## 2013-01-15 DIAGNOSIS — N19 Unspecified kidney failure: Secondary | ICD-10-CM

## 2013-01-15 DIAGNOSIS — I4891 Unspecified atrial fibrillation: Secondary | ICD-10-CM

## 2013-01-15 DIAGNOSIS — I5042 Chronic combined systolic (congestive) and diastolic (congestive) heart failure: Secondary | ICD-10-CM

## 2013-01-15 HISTORY — DX: Atherosclerotic heart disease of native coronary artery without angina pectoris: I25.10

## 2013-01-15 HISTORY — DX: Chronic combined systolic (congestive) and diastolic (congestive) heart failure: I50.42

## 2013-01-15 LAB — BASIC METABOLIC PANEL WITH GFR
CO2: 31 mEq/L (ref 19–32)
GFR, Est African American: 21 mL/min — ABNORMAL LOW
GFR, Est Non African American: 18 mL/min — ABNORMAL LOW
Potassium: 4.4 mEq/L (ref 3.5–5.3)
Sodium: 142 mEq/L (ref 135–145)

## 2013-01-15 MED ORDER — ATORVASTATIN CALCIUM 20 MG PO TABS: 20.0000 mg | ORAL_TABLET | Freq: Every day | ORAL | Status: DC

## 2013-01-15 NOTE — Assessment & Plan Note (Signed)
Patient has history of significant renal insufficiency. Check potassium and renal function.

## 2013-01-15 NOTE — Assessment & Plan Note (Signed)
Given history of coronary artery disease I will discontinue fenofibrate and begin Lipitor 20 mg daily. Check lipids and liver in 6 weeks.

## 2013-01-15 NOTE — Assessment & Plan Note (Signed)
Patient appears to be euvolemic on examination. Continue present dose of Lasix. Potassium and renal function monitored by primary care.

## 2013-01-15 NOTE — Patient Instructions (Addendum)
Your physician wants you to follow-up in: [place] will receive a reminder letter in the mail two months in advance. If you don't receive a letter, please call our office to schedule the follow-up appointment.   STOP ASPIRIN  STOP FENOFIBRATE  START LIPITOR 20 MG ONCE DAILY  Your physician recommends that you return for lab work in: [place] = DO NOT EAT PRIOR TO LAB WORK  Your physician recommends that you HAVE LAB WORK TODAY

## 2013-01-15 NOTE — Assessment & Plan Note (Signed)
Discontinue aspirin given need for Coumadin. Add statin.

## 2013-01-15 NOTE — Assessment & Plan Note (Signed)
Patient remains in permanent atrial fibrillation. Continue Coreg for rate control. Continue Coumadin which is monitored by primary care. Discontinue aspirin given need for Coumadin.

## 2013-01-15 NOTE — Progress Notes (Signed)
HPI: FU CHF (previously followed by Dr Verl Blalock). Also with hx of CAD, status post CABG in 1995, ischemic cardiomyopathy, systolic CHF, permanent atrial fibrillation, LBBB, HTN, HL, CKD. Last echocardiogram in December of 2013 showed an ejection fraction of 50%. There was severe left atrial enlargement, moderate to severe right ventricular enlargement, moderate to severe right atrial enlargement, moderate to severe tricuspid regurgitation, mild mitral regurgitation and trace aortic insufficiency. Since he was last seen in December of 2013 he denies dyspnea, chest pain, syncope, palpitations or bleeding. Occasional mild pedal edema.   Current Outpatient Prescriptions  Medication Sig Dispense Refill  . acetaminophen (TYLENOL) 500 MG tablet Take 650 mg by mouth as needed. For Arthritis      . aspirin 81 MG tablet Take 81 mg by mouth daily.        . carvedilol (COREG) 12.5 MG tablet TAKE 1 TABLET BY MOUTH TWICE DAILY WITH A MEAL  60 tablet  5  . Cholecalciferol (VITAMIN D) 1000 UNITS capsule Take 200 Units by mouth daily.       . clonazePAM (KLONOPIN) 1 MG tablet TAKE 1 TABLET BY MOUTH TWICE DAILY AS NEEDED FOR ANXIETY  60 tablet  0  . clotrimazole-betamethasone (LOTRISONE) cream Apply topically 2 (two) times daily.  30 g  0  . collagenase (SANTYL) ointment Apply topically daily.  30 g  0  . diphenhydrAMINE (BENADRYL) 25 MG tablet Take 25 mg by mouth every 6 (six) hours as needed.        . enalapril (VASOTEC) 20 MG tablet TAKE 1 TABLET BY MOUTH TWICE DAILY  180 tablet  0  . fenofibrate 54 MG tablet TAKE 1 TABLET BY MOUTH TWICE DAILY  180 tablet  1  . ferrous sulfate 325 (65 FE) MG tablet Take 325 mg by mouth daily with breakfast.       . folic acid (FOLVITE) 1 MG tablet Take 1 mg by mouth daily.        . furosemide (LASIX) 40 MG tablet Take 2 tablets (80 mg total) by mouth daily.  60 tablet  0  . hydrocortisone (CORTIZONE-10) 1 % ointment Apply 1 application topically 2 (two) times daily.      Marland Kitchen  lidocaine (XYLOCAINE) 2 % jelly Apply topically as needed.  30 mL  0  . Meth-Hyo-M Bl-Na Phos-Ph Sal (URIBEL) 118 MG CAPS Take 4 capsules by mouth daily.       . Misc Natural Products (OSTEO BI-FLEX ADV DOUBLE ST PO) Take by mouth.        . Multiple Vitamin (MULTIVITAMIN) capsule Take 1 capsule by mouth daily.        Marland Kitchen neomycin-bacitracin-polymyxin (NEOSPORIN) ointment Apply topically every 12 (twelve) hours. apply to eye      . nystatin (MYCOSTATIN/NYSTOP) 100000 UNIT/GM POWD Apply tid  30 g  5  . nystatin (MYCOSTATIN/NYSTOP) 100000 UNIT/GM POWD Use on affected area tid  60 g  3  . nystatin-triamcinolone ointment (MYCOLOG) Apply topically 2 (two) times daily.  60 g  1  . omeprazole (PRILOSEC) 20 MG capsule Take 1 capsule (20 mg total) by mouth 2 (two) times daily.  180 capsule  3  . ondansetron (ZOFRAN) 8 MG tablet TAKE 1 TABLET BY MOUTH EVERY 12 HOURS AS NEEDED FOR NAUSEA  30 tablet  0  . permethrin (ELIMITE) 5 % cream Apply from neck to soles of feet was off in 8-14 hours, repeat in 2 weeks prn  60 g  1  .  Pyridoxine HCl (VITAMIN B-6) 500 MG tablet Take 500 mg by mouth daily.        Marland Kitchen warfarin (COUMADIN) 2.5 MG tablet TAKE 1 TABLET BY MOUTH DAILY  90 tablet  0   No current facility-administered medications for this visit.     Past Medical History  Diagnosis Date  . Neurogenic bladder     has had bladder catheter changes at the urology office every 12 weeks  . Anemia   . Hyperlipidemia   . Hypertension   . Heart attack     1983  . Chronic systolic heart failure     a. prior EF 20-30%;  b. Echo 7/09: EF 35-40%, basal inferoposterior HK, mild AI, mild to moderate MR, moderate to marked LAE, mild to moderate TR, mildly increased PASP, moderate RAE;   c. Echo 01/2012:   mild LVH, EF 50%, Tr AI, mild MR, severe LAE, mod to severe RAE, mod to severe RVE, mod reduced RVSF, mod to severe TR, PASP 46  . Arrhythmia     afib  . Warfarin anticoagulation   . GERD (gastroesophageal reflux  disease)   . Anxiety   . Renal failure     Past Surgical History  Procedure Laterality Date  . Appendectomy    . Cholecystectomy      1971  . Coronary artery bypass graft      1995    History   Social History  . Marital Status: Married    Spouse Name: N/A    Number of Children: N/A  . Years of Education: N/A   Occupational History  . Retired     1994   Social History Main Topics  . Smoking status: Former Research scientist (life sciences)  . Smokeless tobacco: Not on file  . Alcohol Use: No  . Drug Use: No  . Sexual Activity: Not on file   Other Topics Concern  . Not on file   Social History Narrative  . No narrative on file    ROS: no fevers or chills, productive cough, hemoptysis, dysphasia, odynophagia, melena, hematochezia, dysuria, hematuria, rash, seizure activity, orthopnea, PND, pedal edema, claudication. Remaining systems are negative.  Physical Exam: Well-developed well-nourished in no acute distress.  Skin is warm and dry.  HEENT is normal.  Neck is supple.  Chest is clear to auscultation with normal expansion.  Cardiovascular exam is irregular, 2/6 systolic murmur apex Abdominal exam nontender or distended. No masses palpated. Extremities show erythema and trace edema. neuro grossly intact  ECG atrial fibrillation at a rate of 66. Left bundle branch block.

## 2013-01-15 NOTE — Assessment & Plan Note (Signed)
Continue ACE inhibitor and beta blocker. 

## 2013-01-15 NOTE — Assessment & Plan Note (Signed)
Blood pressure controlled. Continue present medications. 

## 2013-01-20 ENCOUNTER — Telehealth: Payer: Self-pay | Admitting: *Deleted

## 2013-01-20 NOTE — Telephone Encounter (Signed)
Discussed lab results with pt, he reports he has had this kidney issue for 20 to 25 years. He does not want to see a nephrologist. Will make dr Stanford Breed aware.

## 2013-01-22 ENCOUNTER — Other Ambulatory Visit: Payer: Self-pay | Admitting: Family Medicine

## 2013-01-30 ENCOUNTER — Other Ambulatory Visit: Payer: Self-pay | Admitting: Cardiology

## 2013-02-06 DIAGNOSIS — R339 Retention of urine, unspecified: Secondary | ICD-10-CM

## 2013-02-06 DIAGNOSIS — Z466 Encounter for fitting and adjustment of urinary device: Secondary | ICD-10-CM

## 2013-02-06 DIAGNOSIS — I509 Heart failure, unspecified: Secondary | ICD-10-CM

## 2013-02-06 DIAGNOSIS — I4891 Unspecified atrial fibrillation: Secondary | ICD-10-CM

## 2013-02-07 ENCOUNTER — Other Ambulatory Visit: Payer: Self-pay | Admitting: Family Medicine

## 2013-02-10 ENCOUNTER — Telehealth: Payer: Self-pay | Admitting: *Deleted

## 2013-02-10 NOTE — Telephone Encounter (Signed)
02/05/2013 Received fax from [place] for [place] and POC forms.  02/07/2013 Forms completed and faxed back.  02/10/2013 Completed forms given to me, attached billing form and returned to [employer]  bw

## 2013-02-15 ENCOUNTER — Other Ambulatory Visit: Payer: Self-pay | Admitting: Family Medicine

## 2013-02-19 NOTE — Telephone Encounter (Signed)
02/19/2013  Copy sent to billing.  bw

## 2013-02-21 ENCOUNTER — Other Ambulatory Visit: Payer: Self-pay | Admitting: Family Medicine

## 2013-02-21 NOTE — Telephone Encounter (Signed)
Med filled.  

## 2013-02-26 ENCOUNTER — Other Ambulatory Visit: Payer: Self-pay | Admitting: Cardiology

## 2013-02-28 ENCOUNTER — Telehealth: Payer: Self-pay

## 2013-02-28 MED ORDER — CLONAZEPAM 1 MG PO TABS: ORAL_TABLET | ORAL | Status: DC

## 2013-02-28 NOTE — Telephone Encounter (Signed)
Refill x1 

## 2013-02-28 NOTE — Telephone Encounter (Signed)
Last seen 12/05/12 and filled 01/03/13 #60. Please advise      KP

## 2013-02-28 NOTE — Telephone Encounter (Signed)
Rx faxed.    KP 

## 2013-03-26 ENCOUNTER — Telehealth: Payer: Self-pay

## 2013-03-26 NOTE — Telephone Encounter (Signed)
When did he stop it?  That may be why it was low-----take extra 2.5 today Recheck 1 week

## 2013-03-26 NOTE — Telephone Encounter (Signed)
Call from patient and he stated that his INR was 1.4 today. He normally takes 2.5 mg daily. He has to stop the warfarin prior to getting his cath changed which was done today. He would like to know if you would like for him to increase as you have requested in the past. His goal is 2-3. Please advise     KP

## 2013-03-26 NOTE — Telephone Encounter (Signed)
Patient has been made aware and voiced understanding.  He will try to arrange transportation and give me a call back to schedule the 1 week follow up   KP

## 2013-03-30 ENCOUNTER — Other Ambulatory Visit: Payer: Self-pay | Admitting: Family Medicine

## 2013-03-31 NOTE — Telephone Encounter (Signed)
Last seen 12/05/12 and filled 02/28/13 #60. Please advise      KP

## 2013-04-11 ENCOUNTER — Telehealth: Payer: Self-pay | Admitting: *Deleted

## 2013-04-11 NOTE — Telephone Encounter (Signed)
Plan of care paperwork received by fax from [facility] requesting that forms be completed, signed and faxed back to [phone]. Forms copied, billing sheet attached and placed in folder for Julienne Kass, RN. JG//CMA

## 2013-04-16 NOTE — Telephone Encounter (Signed)
POC papers placed in Dr.Lowne's red folder for review and signature.

## 2013-04-17 DIAGNOSIS — I2589 Other forms of chronic ischemic heart disease: Secondary | ICD-10-CM

## 2013-04-17 DIAGNOSIS — I2510 Atherosclerotic heart disease of native coronary artery without angina pectoris: Secondary | ICD-10-CM

## 2013-04-17 DIAGNOSIS — I10 Essential (primary) hypertension: Secondary | ICD-10-CM

## 2013-04-17 DIAGNOSIS — I5042 Chronic combined systolic (congestive) and diastolic (congestive) heart failure: Secondary | ICD-10-CM

## 2013-04-17 DIAGNOSIS — R32 Unspecified urinary incontinence: Secondary | ICD-10-CM

## 2013-04-17 DIAGNOSIS — E785 Hyperlipidemia, unspecified: Secondary | ICD-10-CM

## 2013-04-17 DIAGNOSIS — N19 Unspecified kidney failure: Secondary | ICD-10-CM

## 2013-04-17 NOTE — Telephone Encounter (Signed)
Received completed forms. Forms faxed to [place] at [phone]. Billing sheet placed in interoffice folder for Ruby. JG//CMA

## 2013-04-22 ENCOUNTER — Other Ambulatory Visit: Payer: Self-pay | Admitting: Family Medicine

## 2013-04-28 ENCOUNTER — Telehealth: Payer: Self-pay | Admitting: Family Medicine

## 2013-04-28 NOTE — Telephone Encounter (Signed)
Gary [name] from [facility] called and stated that he was suppose to see Gary [name] last week but due to the snow he could not make it. So he would like another order stating that its okay for him to come this week and see Gary [name].    Gary [name] [facility]) [phone]

## 2013-04-28 NOTE — Telephone Encounter (Signed)
The office was closed. Please call tomorrow.     KP

## 2013-04-29 NOTE — Telephone Encounter (Signed)
Verbal order given for [facility], SW to go to see this patient.

## 2013-05-02 ENCOUNTER — Other Ambulatory Visit: Payer: Self-pay | Admitting: Family Medicine

## 2013-05-12 ENCOUNTER — Encounter: Payer: Self-pay | Admitting: Family Medicine

## 2013-05-12 ENCOUNTER — Ambulatory Visit (INDEPENDENT_AMBULATORY_CARE_PROVIDER_SITE_OTHER): Payer: Medicare Other | Admitting: Family Medicine

## 2013-05-12 ENCOUNTER — Telehealth: Payer: Self-pay

## 2013-05-12 VITALS — BP 100/62 | HR 64 | Temp 97.5°F | Wt 214.0 lb

## 2013-05-12 DIAGNOSIS — I4891 Unspecified atrial fibrillation: Secondary | ICD-10-CM

## 2013-05-12 DIAGNOSIS — E785 Hyperlipidemia, unspecified: Secondary | ICD-10-CM

## 2013-05-12 DIAGNOSIS — L0390 Cellulitis, unspecified: Secondary | ICD-10-CM

## 2013-05-12 DIAGNOSIS — Z7901 Long term (current) use of anticoagulants: Secondary | ICD-10-CM

## 2013-05-12 DIAGNOSIS — L0291 Cutaneous abscess, unspecified: Secondary | ICD-10-CM

## 2013-05-12 LAB — CBC WITH DIFFERENTIAL/PLATELET
Basophils Absolute: 0 10*3/uL (ref 0.0–0.1)
Basophils Relative: 0.2 % (ref 0.0–3.0)
Eosinophils Absolute: 0 10*3/uL (ref 0.0–0.7)
Eosinophils Relative: 1.1 % (ref 0.0–5.0)
HCT: 31.8 % — ABNORMAL LOW (ref 39.0–52.0)
Hemoglobin: 10 g/dL — ABNORMAL LOW (ref 13.0–17.0)
LYMPHS PCT: 20.5 % (ref 12.0–46.0)
Lymphs Abs: 0.7 10*3/uL (ref 0.7–4.0)
MCHC: 31.6 g/dL (ref 30.0–36.0)
MCV: 80 fl (ref 78.0–100.0)
MONOS PCT: 7.2 % (ref 3.0–12.0)
Monocytes Absolute: 0.3 10*3/uL (ref 0.1–1.0)
Neutro Abs: 2.6 10*3/uL (ref 1.4–7.7)
Neutrophils Relative %: 71 % (ref 43.0–77.0)
Platelets: 176 10*3/uL (ref 150.0–400.0)
RBC: 3.97 Mil/uL — ABNORMAL LOW (ref 4.22–5.81)
RDW: 16.6 % — AB (ref 11.5–14.6)
WBC: 3.7 10*3/uL — ABNORMAL LOW (ref 4.5–10.5)

## 2013-05-12 LAB — LIPID PANEL
CHOLESTEROL: 62 mg/dL (ref 0–200)
HDL: 26.9 mg/dL — AB (ref >39.00)
LDL Cholesterol: 27 mg/dL (ref 0–99)
Total CHOL/HDL Ratio: 2
Triglycerides: 41 mg/dL (ref 0.0–149.0)
VLDL: 8.2 mg/dL (ref 0.0–40.0)

## 2013-05-12 LAB — POCT INR: INR: 1.8

## 2013-05-12 LAB — BASIC METABOLIC PANEL
BUN: 26 mg/dL — AB (ref 6–23)
CALCIUM: 9.2 mg/dL (ref 8.4–10.5)
CO2: 33 mEq/L — ABNORMAL HIGH (ref 19–32)
CREATININE: 1.5 mg/dL (ref 0.4–1.5)
Chloride: 100 mEq/L (ref 96–112)
GFR: 46.18 mL/min — AB (ref >60.00)
GLUCOSE: 104 mg/dL — AB (ref 70–99)
Potassium: 4 mEq/L (ref 3.5–5.1)
Sodium: 139 mEq/L (ref 135–145)

## 2013-05-12 LAB — HEPATIC FUNCTION PANEL
ALT: 9 U/L (ref 0–53)
AST: 14 U/L (ref 0–37)
Albumin: 3.5 g/dL (ref 3.5–5.2)
Alkaline Phosphatase: 74 U/L (ref 39–117)
BILIRUBIN TOTAL: 1.2 mg/dL (ref 0.3–1.2)
Bilirubin, Direct: 0.3 mg/dL (ref 0.0–0.3)
Total Protein: 6.6 g/dL (ref 6.0–8.3)

## 2013-05-12 MED ORDER — ERYTHROMYCIN BASE 500 MG PO TABS: 500.0000 mg | ORAL_TABLET | Freq: Two times a day (BID) | ORAL | Status: DC

## 2013-05-12 NOTE — Patient Instructions (Signed)
Cellulitis Cellulitis is an infection of the skin and the tissue beneath it. The infected area is usually red and tender. Cellulitis occurs most often in the arms and lower legs.  CAUSES  Cellulitis is caused by bacteria that enter the skin through cracks or cuts in the skin. The most common types of bacteria that cause cellulitis are Staphylococcus and Streptococcus. SYMPTOMS   Redness and warmth.  Swelling.  Tenderness or pain.  Fever. DIAGNOSIS  Your caregiver can usually determine what is wrong based on a physical exam. Blood tests may also be done. TREATMENT  Treatment usually involves taking an antibiotic medicine. HOME CARE INSTRUCTIONS   Take your antibiotics as directed. Finish them even if you start to feel better.  Keep the infected arm or leg elevated to reduce swelling.  Apply a warm cloth to the affected area up to 4 times per day to relieve pain.  Only take over-the-counter or prescription medicines for pain, discomfort, or fever as directed by your caregiver.  Keep all follow-up appointments as directed by your caregiver. SEEK MEDICAL CARE IF:   You notice red streaks coming from the infected area.  Your red area gets larger or turns dark in color.  Your bone or joint underneath the infected area becomes painful after the skin has healed.  Your infection returns in the same area or another area.  You notice a swollen bump in the infected area.  You develop new symptoms. SEEK IMMEDIATE MEDICAL CARE IF:   You have a fever.  You feel very sleepy.  You develop vomiting or diarrhea.  You have a general ill feeling (malaise) with muscle aches and pains. MAKE SURE YOU:   Understand these instructions.  Will watch your condition.  Will get help right away if you are not doing well or get worse. Document Released: 11/23/2004 Document Revised: 08/15/2011 Document Reviewed: 05/01/2011 ExitCare Patient Information 2014 [employer].  

## 2013-05-12 NOTE — Progress Notes (Signed)
Pre visit review using our clinic review tool, if applicable. No additional management support is needed unless otherwise documented below in the visit note. 

## 2013-05-12 NOTE — Telephone Encounter (Signed)
Detailed message left for home health, however I discussed with the patient and made him aware that it would be better if he was seen and the Cellulitis was evaluated by PCP. He voiced understanding, he requested an ABX and I made him aware he needs to come in. Apt scheduled for today at 2:45. He will call his son.    KP

## 2013-05-12 NOTE — Telephone Encounter (Signed)
Call from the patient and he stated he has cellulitis to the left lower leg that started 2-3 weeks ago the size of a quarter, he has been cleaning the area and putting triple antibiotic ointment on it is now the size of a softball. He said it is from the ankle bone up and 2 inches wide, he does not have transportation. I can call home health to evaluate or if you have any other suggestions. Please advise     KP

## 2013-05-12 NOTE — Progress Notes (Signed)
Patient ID: Gary Lope Sr., male   DOB: December 11, 1929, 80 y.o.   MRN: [id number]   Subjective:    Patient ID: Gary Lope Sr., male    DOB: [birth year], 80 y.o.   MRN: [id number] HPI Pt here c/o cellulitis L low leg medially.  No calf pain  Sob.  Etc.  Pt is also here for pt check and          Objective:    BP 100/62  Pulse 64  Temp(Src) 97.5 F (36.4 C) (Oral)  Wt 214 lb (97.07 kg)  SpO2 95% General appearance: alert, cooperative, appears stated age and no distress Ears: normal TM's and external ear canals both ears Nose: Nares normal. Septum midline. Mucosa normal. No drainage or sinus tenderness. Throat: lips, mucosa, and tongue normal; teeth and gums normal Neck: no adenopathy, supple, symmetrical, trachea midline and thyroid not enlarged, symmetric, no tenderness/mass/nodules Lungs: clear to auscultation bilaterally Extremities: extremities normal, atraumatic, no cyanosis or edema Skin: errythema L medial low leg, with blisters, no calf pain        Assessment & Plan:  1. Cellulitis Home health to help with care - erythromycin base (E-MYCIN) 500 MG tablet; Take 1 tablet (500 mg total) by mouth 2 (two) times daily.  Dispense: 20 tablet; Refill: 0  2. Other and unspecified hyperlipidemia Check labs - Hepatic function panel - Lipid panel - POCT urinalysis dipstick  3. Atrial fibrillation Per cards, on coumadin Pt checked and coumadin adjusted-- Lab Results  Component Value Date   INR 1.8 05/12/2013   INR 1.9 12/05/2012   INR 1.8 10/10/2012    - Basic metabolic panel - CBC with Differential - POCT urinalysis dipstick - POCT INR  4. Long term (current) use of anticoagulants  - POCT INR

## 2013-05-12 NOTE — Telephone Encounter (Signed)
Call home health to evaluate--- if there is anyway he can get here that would be best.

## 2013-05-14 ENCOUNTER — Telehealth: Payer: Self-pay

## 2013-05-14 NOTE — Telephone Encounter (Signed)
Great!

## 2013-05-14 NOTE — Telephone Encounter (Signed)
Call from patient and he wanted to make [place] aware that his would is getting better. No concerns at this time      KP

## 2013-05-15 ENCOUNTER — Other Ambulatory Visit: Payer: Self-pay | Admitting: Family Medicine

## 2013-05-15 DIAGNOSIS — N189 Chronic kidney disease, unspecified: Secondary | ICD-10-CM

## 2013-05-16 ENCOUNTER — Other Ambulatory Visit: Payer: Self-pay | Admitting: Family Medicine

## 2013-05-21 ENCOUNTER — Encounter: Payer: Self-pay | Admitting: *Deleted

## 2013-05-21 ENCOUNTER — Telehealth: Payer: Self-pay

## 2013-05-21 ENCOUNTER — Telehealth: Payer: Self-pay | Admitting: *Deleted

## 2013-05-21 NOTE — Telephone Encounter (Signed)
Spoke with patient and his wife, discussed labs and Dr. Nonda Lou recomnendation for nephrology referral. Patient and wife both have requested that the referral be cancelled because they "do no see a need for that" They have both refused to see any other doctor because they know "nothing else can be done to help current the problem with his current condition. " Copy of labs mailed to the patient

## 2013-05-21 NOTE — Telephone Encounter (Signed)
Message copied by Chilton Greathouse on Wed May 21, 2013 11:23 AM ------      Message from: Rosalita Chessman      Created: Thu May 15, 2013 10:38 PM       Chronic renal insufficency and anemia--  Pt was referred to nephrology a while ago but it doesn't look like he went      Pt really needs to see nephrolgoy ------

## 2013-05-21 NOTE — Telephone Encounter (Signed)
MSG on VM from the patient and Buzz at [facility] requesting orders for Cellulitis for the lower extremities. The patient stated He had 10 days of ABX with 2 days left, he stated he is having swelling and the cellulitis is spreading to the other leg. He has call [facility] and they are waiting for orders. The VM from Buzz was requesting orders for care.  I gave Buzz the verbal per Dr.Lowne and made the patient's wife aware. I will also fax the order to [facility].        KP

## 2013-05-26 ENCOUNTER — Other Ambulatory Visit: Payer: Self-pay | Admitting: Cardiology

## 2013-06-12 ENCOUNTER — Emergency Department (HOSPITAL_BASED_OUTPATIENT_CLINIC_OR_DEPARTMENT_OTHER): Payer: Medicare Other

## 2013-06-12 ENCOUNTER — Telehealth: Payer: Self-pay

## 2013-06-12 ENCOUNTER — Encounter (HOSPITAL_BASED_OUTPATIENT_CLINIC_OR_DEPARTMENT_OTHER): Payer: Self-pay | Admitting: Emergency Medicine

## 2013-06-12 ENCOUNTER — Inpatient Hospital Stay (HOSPITAL_BASED_OUTPATIENT_CLINIC_OR_DEPARTMENT_OTHER)
Admission: EM | Admit: 2013-06-12 | Discharge: 2013-06-16 | DRG: 439 | Disposition: A | Discharge status: Home Health | Payer: Medicare Other | Attending: Internal Medicine | Admitting: Internal Medicine

## 2013-06-12 DIAGNOSIS — I50810 Right heart failure, unspecified: Secondary | ICD-10-CM | POA: Diagnosis present

## 2013-06-12 DIAGNOSIS — N19 Unspecified kidney failure: Secondary | ICD-10-CM

## 2013-06-12 DIAGNOSIS — I5042 Chronic combined systolic (congestive) and diastolic (congestive) heart failure: Secondary | ICD-10-CM

## 2013-06-12 DIAGNOSIS — I509 Heart failure, unspecified: Secondary | ICD-10-CM | POA: Diagnosis present

## 2013-06-12 DIAGNOSIS — N183 Chronic kidney disease, stage 3 unspecified: Secondary | ICD-10-CM

## 2013-06-12 DIAGNOSIS — Z8249 Family history of ischemic heart disease and other diseases of the circulatory system: Secondary | ICD-10-CM

## 2013-06-12 DIAGNOSIS — I2510 Atherosclerotic heart disease of native coronary artery without angina pectoris: Secondary | ICD-10-CM | POA: Diagnosis present

## 2013-06-12 DIAGNOSIS — I079 Rheumatic tricuspid valve disease, unspecified: Secondary | ICD-10-CM | POA: Diagnosis present

## 2013-06-12 DIAGNOSIS — R188 Other ascites: Secondary | ICD-10-CM | POA: Diagnosis present

## 2013-06-12 DIAGNOSIS — Z888 Allergy status to other drugs, medicaments and biological substances status: Secondary | ICD-10-CM

## 2013-06-12 DIAGNOSIS — I10 Essential (primary) hypertension: Secondary | ICD-10-CM | POA: Diagnosis present

## 2013-06-12 DIAGNOSIS — K859 Acute pancreatitis without necrosis or infection, unspecified: Principal | ICD-10-CM | POA: Diagnosis present

## 2013-06-12 DIAGNOSIS — K869 Disease of pancreas, unspecified: Secondary | ICD-10-CM | POA: Diagnosis present

## 2013-06-12 DIAGNOSIS — I4891 Unspecified atrial fibrillation: Secondary | ICD-10-CM | POA: Diagnosis present

## 2013-06-12 DIAGNOSIS — N4289 Other specified disorders of prostate: Secondary | ICD-10-CM | POA: Diagnosis present

## 2013-06-12 DIAGNOSIS — Z87891 Personal history of nicotine dependence: Secondary | ICD-10-CM

## 2013-06-12 DIAGNOSIS — R399 Unspecified symptoms and signs involving the genitourinary system: Secondary | ICD-10-CM | POA: Diagnosis present

## 2013-06-12 DIAGNOSIS — I2589 Other forms of chronic ischemic heart disease: Secondary | ICD-10-CM

## 2013-06-12 DIAGNOSIS — N390 Urinary tract infection, site not specified: Secondary | ICD-10-CM | POA: Diagnosis present

## 2013-06-12 DIAGNOSIS — R1900 Intra-abdominal and pelvic swelling, mass and lump, unspecified site: Secondary | ICD-10-CM

## 2013-06-12 DIAGNOSIS — Z79899 Other long term (current) drug therapy: Secondary | ICD-10-CM

## 2013-06-12 DIAGNOSIS — I25810 Atherosclerosis of coronary artery bypass graft(s) without angina pectoris: Secondary | ICD-10-CM

## 2013-06-12 DIAGNOSIS — D649 Anemia, unspecified: Secondary | ICD-10-CM | POA: Diagnosis present

## 2013-06-12 DIAGNOSIS — I447 Left bundle-branch block, unspecified: Secondary | ICD-10-CM | POA: Diagnosis present

## 2013-06-12 DIAGNOSIS — Z951 Presence of aortocoronary bypass graft: Secondary | ICD-10-CM

## 2013-06-12 DIAGNOSIS — I059 Rheumatic mitral valve disease, unspecified: Secondary | ICD-10-CM | POA: Diagnosis present

## 2013-06-12 DIAGNOSIS — I071 Rheumatic tricuspid insufficiency: Secondary | ICD-10-CM

## 2013-06-12 DIAGNOSIS — Z7901 Long term (current) use of anticoagulants: Secondary | ICD-10-CM

## 2013-06-12 DIAGNOSIS — I252 Old myocardial infarction: Secondary | ICD-10-CM

## 2013-06-12 DIAGNOSIS — L0390 Cellulitis, unspecified: Secondary | ICD-10-CM

## 2013-06-12 DIAGNOSIS — Z87440 Personal history of urinary (tract) infections: Secondary | ICD-10-CM

## 2013-06-12 DIAGNOSIS — I2789 Other specified pulmonary heart diseases: Secondary | ICD-10-CM | POA: Diagnosis present

## 2013-06-12 DIAGNOSIS — I519 Heart disease, unspecified: Secondary | ICD-10-CM

## 2013-06-12 DIAGNOSIS — I255 Ischemic cardiomyopathy: Secondary | ICD-10-CM

## 2013-06-12 DIAGNOSIS — I4821 Permanent atrial fibrillation: Secondary | ICD-10-CM | POA: Diagnosis present

## 2013-06-12 DIAGNOSIS — F411 Generalized anxiety disorder: Secondary | ICD-10-CM | POA: Diagnosis present

## 2013-06-12 DIAGNOSIS — K219 Gastro-esophageal reflux disease without esophagitis: Secondary | ICD-10-CM | POA: Diagnosis present

## 2013-06-12 DIAGNOSIS — I5022 Chronic systolic (congestive) heart failure: Secondary | ICD-10-CM | POA: Diagnosis present

## 2013-06-12 DIAGNOSIS — I340 Nonrheumatic mitral (valve) insufficiency: Secondary | ICD-10-CM

## 2013-06-12 DIAGNOSIS — I129 Hypertensive chronic kidney disease with stage 1 through stage 4 chronic kidney disease, or unspecified chronic kidney disease: Secondary | ICD-10-CM | POA: Diagnosis present

## 2013-06-12 DIAGNOSIS — E785 Hyperlipidemia, unspecified: Secondary | ICD-10-CM | POA: Diagnosis present

## 2013-06-12 DIAGNOSIS — N319 Neuromuscular dysfunction of bladder, unspecified: Secondary | ICD-10-CM | POA: Diagnosis present

## 2013-06-12 DIAGNOSIS — I38 Endocarditis, valve unspecified: Secondary | ICD-10-CM

## 2013-06-12 DIAGNOSIS — I5043 Acute on chronic combined systolic (congestive) and diastolic (congestive) heart failure: Secondary | ICD-10-CM

## 2013-06-12 LAB — COMPREHENSIVE METABOLIC PANEL
ALT: 7 U/L (ref 0–53)
AST: 19 U/L (ref 0–37)
Albumin: 3.7 g/dL (ref 3.5–5.2)
Alkaline Phosphatase: 102 U/L (ref 39–117)
BUN: 32 mg/dL — ABNORMAL HIGH (ref 6–23)
CALCIUM: 9.8 mg/dL (ref 8.4–10.5)
CO2: 30 mEq/L (ref 19–32)
CREATININE: 1.8 mg/dL — AB (ref 0.50–1.35)
Chloride: 97 mEq/L (ref 96–112)
GFR calc Af Amer: 39 mL/min — ABNORMAL LOW (ref >90)
GFR, EST NON AFRICAN AMERICAN: 33 mL/min — AB (ref >90)
Glucose, Bld: 126 mg/dL — ABNORMAL HIGH (ref 70–99)
Potassium: 5.3 mEq/L (ref 3.7–5.3)
Sodium: 137 mEq/L (ref 137–147)
TOTAL PROTEIN: 6.9 g/dL (ref 6.0–8.3)
Total Bilirubin: 0.7 mg/dL (ref 0.3–1.2)

## 2013-06-12 LAB — URINALYSIS, ROUTINE W REFLEX MICROSCOPIC
Bilirubin Urine: NEGATIVE
GLUCOSE, UA: NEGATIVE mg/dL
Ketones, ur: NEGATIVE mg/dL
Nitrite: POSITIVE — AB
Protein, ur: 100 mg/dL — AB
Specific Gravity, Urine: 1.017 (ref 1.005–1.030)
Urobilinogen, UA: 0.2 mg/dL (ref 0.0–1.0)
pH: 5.5 (ref 5.0–8.0)

## 2013-06-12 LAB — URINE MICROSCOPIC-ADD ON

## 2013-06-12 LAB — CBC WITH DIFFERENTIAL/PLATELET
BASOS PCT: 0 % (ref 0–1)
Basophils Absolute: 0 10*3/uL (ref 0.0–0.1)
EOS ABS: 0.1 10*3/uL (ref 0.0–0.7)
Eosinophils Relative: 2 % (ref 0–5)
HEMATOCRIT: 34.8 % — AB (ref 39.0–52.0)
Hemoglobin: 10.9 g/dL — ABNORMAL LOW (ref 13.0–17.0)
Lymphocytes Relative: 18 % (ref 12–46)
Lymphs Abs: 0.9 10*3/uL (ref 0.7–4.0)
MCH: 25.8 pg — ABNORMAL LOW (ref 26.0–34.0)
MCHC: 31.3 g/dL (ref 30.0–36.0)
MCV: 82.5 fL (ref 78.0–100.0)
MONO ABS: 0.5 10*3/uL (ref 0.1–1.0)
Monocytes Relative: 10 % (ref 3–12)
Neutro Abs: 3.7 10*3/uL (ref 1.7–7.7)
Neutrophils Relative %: 71 % (ref 43–77)
PLATELETS: 216 10*3/uL (ref 150–400)
RBC: 4.22 MIL/uL (ref 4.22–5.81)
RDW: 16.8 % — AB (ref 11.5–15.5)
WBC: 5.2 10*3/uL (ref 4.0–10.5)

## 2013-06-12 LAB — I-STAT CG4 LACTIC ACID, ED: Lactic Acid, Venous: 1.45 mmol/L (ref 0.5–2.2)

## 2013-06-12 LAB — LIPASE, BLOOD: Lipase: 46 U/L (ref 11–59)

## 2013-06-12 MED ORDER — IOHEXOL 300 MG/ML  SOLN
50.0000 mL | Freq: Once | INTRAMUSCULAR | Status: AC | PRN
  Administered 2013-06-12: 50 mL via ORAL

## 2013-06-12 MED ORDER — LORAZEPAM 2 MG/ML IJ SOLN
1.0000 mg | Freq: Once | INTRAMUSCULAR | Status: AC
  Administered 2013-06-12: 1 mg via INTRAVENOUS
  Filled 2013-06-12: qty 1

## 2013-06-12 MED ORDER — METOCLOPRAMIDE HCL 5 MG/ML IJ SOLN
10.0000 mg | Freq: Once | INTRAMUSCULAR | Status: AC
  Administered 2013-06-12: 10 mg via INTRAVENOUS
  Filled 2013-06-12: qty 2

## 2013-06-12 MED ORDER — CEFTRIAXONE SODIUM 1 G IJ SOLR
INTRAMUSCULAR | Status: AC
  Filled 2013-06-12: qty 10

## 2013-06-12 MED ORDER — DEXTROSE 5 % IV SOLN
1.0000 g | Freq: Once | INTRAVENOUS | Status: AC
  Administered 2013-06-12: 1 g via INTRAVENOUS

## 2013-06-12 MED ORDER — ACETAMINOPHEN 500 MG PO TABS
1000.0000 mg | ORAL_TABLET | Freq: Once | ORAL | Status: AC
  Administered 2013-06-12: 1000 mg via ORAL
  Filled 2013-06-12: qty 2

## 2013-06-12 NOTE — Telephone Encounter (Signed)
Call from patient and he stated he stated he is taking 2 Furosemide daily and is currently having some swelling in the legs, ankles and abdomen. He is not having any SOB, and he is urinating okay, but his night bag is still half full.  He said in the past he was increased to 4 tablets to get the fluid off and wanted to know if he could increase the furosemide. Last cath change was 1 week ago.  He still has the cellulitis, it comes and goes.     Please advise

## 2013-06-12 NOTE — ED Notes (Signed)
Pt c/o distended painful abd nausea only  , decreased po intake , increased swelling of lower ext

## 2013-06-12 NOTE — ED Provider Notes (Signed)
CSN: [id number]     Arrival date & time 06/12/13  1710 History   First MD Initiated Contact with Patient 06/12/13 1722     Chief Complaint  Patient presents with  . Abdominal Pain     (Consider location/radiation/quality/duration/timing/severity/associated sxs/prior Treatment) Patient is a 80 y.o. male presenting with abdominal pain. The history is provided by the patient.  Abdominal Pain Pain location:  Generalized Pain quality: bloating   Pain radiates to:  Does not radiate Pain severity:  No pain Onset quality:  Gradual Duration:  2 weeks Timing:  Constant Progression:  Unchanged Chronicity:  New Context: previous surgery   Relieved by:  Belching and flatus Worsened by:  Nothing tried Ineffective treatments:  None tried Associated symptoms: belching, diarrhea (1 week ago), flatus and nausea   Associated symptoms: no constipation, no cough, no fatigue, no fever, no shortness of breath, no vaginal bleeding, no vaginal discharge and no vomiting     Past Medical History  Diagnosis Date  . Neurogenic bladder     has had bladder catheter changes at the urology office every 12 weeks  . Anemia   . Hyperlipidemia   . Hypertension   . Heart attack     1983  . Chronic systolic heart failure     a. prior EF 20-30%;  b. Echo 7/09: EF 35-40%, basal inferoposterior HK, mild AI, mild to moderate MR, moderate to marked LAE, mild to moderate TR, mildly increased PASP, moderate RAE;   c. Echo 01/2012:   mild LVH, EF 50%, Tr AI, mild MR, severe LAE, mod to severe RAE, mod to severe RVE, mod reduced RVSF, mod to severe TR, PASP 46  . Arrhythmia     afib  . Warfarin anticoagulation   . GERD (gastroesophageal reflux disease)   . Anxiety   . Renal failure    Past Surgical History  Procedure Laterality Date  . Appendectomy    . Cholecystectomy      1971  . Coronary artery bypass graft      1995   Family History  Problem Relation Age of Onset  . Cancer Mother   . Heart attack  Father     deceased at 26, smoking, ETOH abuse  . Breast cancer Sister    History  Substance Use Topics  . Smoking status: Former Research scientist (life sciences)  . Smokeless tobacco: Not on file  . Alcohol Use: No    Review of Systems  Constitutional: Negative for fever and fatigue.  Respiratory: Negative for cough and shortness of breath.   Gastrointestinal: Positive for nausea, abdominal pain, diarrhea (1 week ago) and flatus. Negative for vomiting, constipation and blood in stool.  Genitourinary: Negative for vaginal bleeding and vaginal discharge.  All other systems reviewed and are negative.     Allergies  Amoxicillin; Diltiazem hcl; Hydrocodone-acetaminophen; Niacin; Niacin-lovastatin er; Paroxetine; Propoxyphene n-acetaminophen; Pseudoephedrine; Sulfonamide derivatives; and Tolterodine tartrate  Home Medications   Prior to Admission medications   Medication Sig Start Date End Date Taking? Authorizing Provider  acetaminophen (TYLENOL) 500 MG tablet Take 650 mg by mouth as needed. For Arthritis    Historical Provider, MD  atorvastatin (LIPITOR) 20 MG tablet Take 1 tablet (20 mg total) by mouth daily. 01/15/13   [name], MD  carvedilol (COREG) 12.5 MG tablet TAKE 1 TABLET BY MOUTH TWICE DAILY WITH MEALS 05/02/13   Rosalita Chessman, DO  Cholecalciferol (VITAMIN D) 1000 UNITS capsule Take 200 Units by mouth daily.     Historical Provider,  MD  clonazePAM (KLONOPIN) 1 MG tablet TAKE 1 TABLET BY MOUTH TWICE DAILY AS NEEDED FOR ANXIETY 03/30/13   Rosalita Chessman, DO  clotrimazole-betamethasone (LOTRISONE) cream Apply topically 2 (two) times daily. 12/05/12   Rosalita Chessman, DO  collagenase (SANTYL) ointment Apply topically daily. 07/18/12   Rosalita Chessman, DO  diphenhydrAMINE (BENADRYL) 25 MG tablet Take 25 mg by mouth every 6 (six) hours as needed.      Historical Provider, MD  enalapril (VASOTEC) 20 MG tablet TAKE 1 TABLET BY MOUTH TWICE DAILY 05/26/13   [name], MD  erythromycin base (E-MYCIN)  500 MG tablet Take 1 tablet (500 mg total) by mouth 2 (two) times daily. 05/12/13   Rosalita Chessman, DO  ferrous sulfate 325 (65 FE) MG tablet Take 325 mg by mouth daily with breakfast.     Historical Provider, MD  folic acid (FOLVITE) 1 MG tablet Take 1 mg by mouth daily.      Historical Provider, MD  furosemide (LASIX) 40 MG tablet TAKE 2 TABLETS BY MOUTH DAILY 01/30/13   [name], MD  hydrocortisone (CORTIZONE-10) 1 % ointment Apply 1 application topically 2 (two) times daily.    Historical Provider, MD  lidocaine (XYLOCAINE) 2 % jelly APPLY TOPICALLY AS NEEDED 02/21/13   Rosalita Chessman, DO  Meth-Hyo-M Bl-Na Phos-Ph Sal (URIBEL) 118 MG CAPS Take 4 capsules by mouth daily.  05/02/12   Historical Provider, MD  Misc Natural Products (OSTEO BI-FLEX ADV DOUBLE ST PO) Take by mouth.      Historical Provider, MD  Multiple Vitamin (MULTIVITAMIN) capsule Take 1 capsule by mouth daily.      Historical Provider, MD  neomycin-bacitracin-polymyxin (NEOSPORIN) ointment Apply topically every 12 (twelve) hours. apply to eye    Historical Provider, MD  nystatin (MYCOSTATIN/NYSTOP) 100000 UNIT/GM POWD Use on affected area tid 11/14/12   Rosalita Chessman, DO  nystatin-triamcinolone ointment (MYCOLOG) APPLY EXTERNALLY TO THE AFFECTED AREA TWICE DAILY 02/07/13   Alferd Apa Lowne, DO  omeprazole (PRILOSEC) 20 MG capsule TAKE 1 CAPSULE BY MOUTH TWICE DAILY    [name], DO  ondansetron (ZOFRAN) 8 MG tablet TAKE 1 TABLET BY MOUTH EVERY 12 HOURS AS NEEDED FOR NAUSEA 03/30/13   Rosalita Chessman, DO  permethrin (ELIMITE) 5 % cream Apply from neck to soles of feet was off in 8-14 hours, repeat in 2 weeks prn 10/23/12   Midge Minium, MD  Pyridoxine HCl (VITAMIN B-6) 500 MG tablet Take 500 mg by mouth daily.      Historical Provider, MD  triamcinolone cream (KENALOG) 0.1 % APPLY TO THE AFFECTED AREA TWICE DAILY AS NEEDED. NOT TO FACE, GROIN OR UNDERARMS 05/02/13   Rosalita Chessman, DO  warfarin (COUMADIN) 2.5 MG tablet TAKE 1  TABLET BY MOUTH EVERY DAY 04/22/13   Rosalita Chessman, DO   BP 116/83  Pulse 88  Temp(Src) 97.7 F (36.5 C) (Oral)  Resp 16  Wt 220 lb (99.791 kg)  SpO2 99% Physical Exam  Nursing note and vitals reviewed. Constitutional: He is oriented to person, place, and time. He appears well-developed and well-nourished. No distress.  HENT:  Head: Normocephalic and atraumatic.  Mouth/Throat: No oropharyngeal exudate.  Eyes: EOM are normal. Pupils are equal, round, and reactive to light.  Neck: Normal range of motion. Neck supple.  Cardiovascular: Normal rate and regular rhythm.  Exam reveals no friction rub.   No murmur heard. Pulmonary/Chest: Effort normal and breath sounds normal.  No respiratory distress. He has no wheezes. He has no rales.  Abdominal: He exhibits distension (lower abdomen, with tympany). He exhibits no mass. There is no tenderness. There is no rebound and no guarding.  No abdominal wall edema.   Musculoskeletal: Normal range of motion. He exhibits edema (1+, bilateral lower extremities below knee, posterior thights bilaterally).  Neurological: He is alert and oriented to person, place, and time.  Skin: No rash noted. He is not diaphoretic.    ED Course  Procedures (including critical care time) Labs Review Labs Reviewed  CBC WITH DIFFERENTIAL - Abnormal; Notable for the following:    Hemoglobin 10.9 (*)    HCT 34.8 (*)    MCH 25.8 (*)    RDW 16.8 (*)    All other components within normal limits  COMPREHENSIVE METABOLIC PANEL    Imaging Review Ct Abdomen Pelvis Wo Contrast  06/12/2013   CLINICAL DATA:  Bloating  EXAM: CT ABDOMEN AND PELVIS WITHOUT CONTRAST  TECHNIQUE: Multidetector CT imaging of the abdomen and pelvis was performed following the standard protocol without IV contrast.  COMPARISON:  None.  FINDINGS: Moderate ascites within the abdomen.  There is stranding associated with the pancreatic head. Fat planes in the pancreatic at are obscured. Findings are  worrisome for pancreatitis. There is a loculated fluid collection in the region of the uncinate process of the pancreas measuring 3.6 x 3.2 cm. There are no air bubbles within the fluid collection. Fluid extends from this area adjacent to the IVC on image 33. There is a large amount of free fluid in the lesser sac. The omentum is somewhat abnormal. See image 31. Within the omental fat, there are areas of either soft tissue or fluid density. Omental caking and peritoneal tumor would be difficult to exclude.  Foley catheter decompresses the bladder. The prostate is prominent. It measures 7.2 cm in diameter.  Periportal edema in the liver is suspected. Nonspecific 1.1 x 2.1 cm hypodensity in the right lobe of the liver on image 32.  Spleen is within normal limits.  Adrenal glands are unremarkable.  Kidneys are atrophic. Simple cyst in the upper pole of the left kidney.  Atherosclerotic vascular calcifications are noted. Small hiatal hernia is present.  Small right pleural effusion.  Degenerative changes in the lumbar spine. No vertebral compression deformity. Sclerotic focus in the left iliac bone.  IMPRESSION: There are inflammatory changes in the pancreas. Correlate with amylase and lipase.  3.6 cm fluid collection adjacent to the uncinate process of the pancreas. This may represent a pseudocyst, however follow-up studies to ensure resolution are recommended.  Large amount of free fluid in the abdomen including the lesser sac. The omentum may be abnormal. Omental spread of tumor cannot be excluded.  Prostate gland is prominent.   Electronically Signed   By: Maryclare Bean M.D.   On: 06/12/2013 20:52   Dg Abd Acute W/chest  06/12/2013   CLINICAL DATA:  Low pain and distention.  Nausea.  EXAM: ACUTE ABDOMEN SERIES (ABDOMEN 2 VIEW & CHEST 1 VIEW)  COMPARISON:  None.  FINDINGS: There is cardiomegaly. Prior CABG. Pulmonary vascularity is normal. Slight blunting of the right costophrenic angle may represent a small pleural  effusion.  No free air in the abdomen. There is air in nondistended large and small bowel loops. There is moderate air in the stomach. Overall density in the abdomen suggest the possibility of ascites but this is not definitive.  There are degenerative changes in the spine but  there is no acute osseous abnormality.  IMPRESSION: 1. Probable small right pleural effusion. 2. Possible ascites.   Electronically Signed   By: Rozetta Nunnery M.D.   On: 06/12/2013 18:56     EKG Interpretation None      MDM   Final diagnoses:  Pancreatitis  Abdominal mass    56M presents with abdominal distention and nausea. Distention for past 2 week, nausea for 1 week. No fevers. Associated belching, flatus. No vomiting. Diarrhea last week. Normal BMs now. Chronic indwelling Foley for past 12 years. Abdomen distended on exam with tympany. No tenderness. Mild lower extremity edema - 1+. No abdominal wall edema.  Will start with AAS then Abdominal CT. CT shows pancreatitis and possible omental mass. New ascites. I spoke with Radiology - not hemorrhagic pancreaitits. Lipase 45. Concern for mass causing most of symptoms. Patient admitted to [facility] by Dr. Hal Hope for his symptoms and new abdominal mass.    Osvaldo Shipper, MD 06/12/13 [phone]

## 2013-06-12 NOTE — Progress Notes (Signed)
Received report from Karen, RN. 

## 2013-06-12 NOTE — Telephone Encounter (Signed)
Spoke with the patient and wife and she stated she was concerned about his abdominal swelling.She felt increasing the Furosemide would worsen the kidneys. I made the patient aware that I would feel better if he went to the Hospital for an evaluation. Patient has a history of Renal failure and Dr.Lowne is out of the office tomorrow. The wife agreed and said they would go to the [facility] on [address].       KP

## 2013-06-12 NOTE — ED Notes (Signed)
Pt requested tylenol 1000mg  and snack-EDP [place] Amber approved

## 2013-06-12 NOTE — ED Notes (Signed)
Patient transported to CT 

## 2013-06-12 NOTE — Telephone Encounter (Signed)
He can increase it for 2-3 days and then go back down

## 2013-06-13 ENCOUNTER — Observation Stay (HOSPITAL_COMMUNITY): Payer: Medicare Other

## 2013-06-13 ENCOUNTER — Encounter (HOSPITAL_COMMUNITY): Payer: Self-pay | Admitting: General Practice

## 2013-06-13 ENCOUNTER — Inpatient Hospital Stay (HOSPITAL_COMMUNITY): Payer: Medicare Other

## 2013-06-13 DIAGNOSIS — I5042 Chronic combined systolic (congestive) and diastolic (congestive) heart failure: Secondary | ICD-10-CM

## 2013-06-13 DIAGNOSIS — K859 Acute pancreatitis without necrosis or infection, unspecified: Secondary | ICD-10-CM

## 2013-06-13 DIAGNOSIS — D649 Anemia, unspecified: Secondary | ICD-10-CM

## 2013-06-13 DIAGNOSIS — I10 Essential (primary) hypertension: Secondary | ICD-10-CM

## 2013-06-13 DIAGNOSIS — N390 Urinary tract infection, site not specified: Secondary | ICD-10-CM

## 2013-06-13 LAB — BODY FLUID CELL COUNT WITH DIFFERENTIAL
Eos, Fluid: 0 %
Lymphs, Fluid: 37 %
Monocyte-Macrophage-Serous Fluid: 58 % (ref 50–90)
Neutrophil Count, Fluid: 5 % (ref 0–25)
Total Nucleated Cell Count, Fluid: 169 cu mm (ref 0–1000)

## 2013-06-13 LAB — PROTIME-INR
INR: 2.38 — AB (ref 0.00–1.49)
PROTHROMBIN TIME: 25.2 s — AB (ref 11.6–15.2)

## 2013-06-13 LAB — LACTATE DEHYDROGENASE, PLEURAL OR PERITONEAL FLUID: LD FL: 54 U/L — AB (ref 3–23)

## 2013-06-13 LAB — HEPATIC FUNCTION PANEL
ALK PHOS: 98 U/L (ref 39–117)
ALT: 9 U/L (ref 0–53)
AST: 20 U/L (ref 0–37)
Albumin: 3.5 g/dL (ref 3.5–5.2)
Bilirubin, Direct: 0.3 mg/dL (ref 0.0–0.3)
Indirect Bilirubin: 0.5 mg/dL (ref 0.3–0.9)
TOTAL PROTEIN: 6.7 g/dL (ref 6.0–8.3)
Total Bilirubin: 0.8 mg/dL (ref 0.3–1.2)

## 2013-06-13 LAB — CBC
HEMATOCRIT: 33 % — AB (ref 39.0–52.0)
Hemoglobin: 10.2 g/dL — ABNORMAL LOW (ref 13.0–17.0)
MCH: 25.3 pg — AB (ref 26.0–34.0)
MCHC: 30.9 g/dL (ref 30.0–36.0)
MCV: 81.9 fL (ref 78.0–100.0)
PLATELETS: 209 10*3/uL (ref 150–400)
RBC: 4.03 MIL/uL — ABNORMAL LOW (ref 4.22–5.81)
RDW: 16.4 % — AB (ref 11.5–15.5)
WBC: 5.1 10*3/uL (ref 4.0–10.5)

## 2013-06-13 LAB — CANCER ANTIGEN 19-9: CA 19-9: 12.9 U/mL — ABNORMAL LOW (ref <35.0)

## 2013-06-13 LAB — ALBUMIN, FLUID (OTHER): ALBUMIN FL: 2.3 g/dL

## 2013-06-13 LAB — CA 125: CA 125: 317.8 U/mL — ABNORMAL HIGH (ref 0.0–30.2)

## 2013-06-13 LAB — CREATININE, SERUM
Creatinine, Ser: 1.56 mg/dL — ABNORMAL HIGH (ref 0.50–1.35)
GFR calc Af Amer: 46 mL/min — ABNORMAL LOW (ref >90)
GFR calc non Af Amer: 40 mL/min — ABNORMAL LOW (ref >90)

## 2013-06-13 MED ORDER — ATORVASTATIN CALCIUM 20 MG PO TABS
20.0000 mg | ORAL_TABLET | Freq: Every day | ORAL | Status: DC
  Administered 2013-06-13 – 2013-06-16 (×4): 20 mg via ORAL
  Filled 2013-06-13 (×4): qty 1

## 2013-06-13 MED ORDER — GADOBENATE DIMEGLUMINE 529 MG/ML IV SOLN
20.0000 mL | Freq: Once | INTRAVENOUS | Status: AC | PRN
Start: 2013-06-13 — End: 2013-06-13

## 2013-06-13 MED ORDER — FOLIC ACID 1 MG PO TABS
1.0000 mg | ORAL_TABLET | Freq: Every day | ORAL | Status: DC
  Administered 2013-06-13 – 2013-06-16 (×4): 1 mg via ORAL
  Filled 2013-06-13 (×4): qty 1

## 2013-06-13 MED ORDER — CLONAZEPAM 1 MG PO TABS: 1.0000 mg | ORAL_TABLET | Freq: Two times a day (BID) | ORAL | Status: DC | PRN

## 2013-06-13 MED ORDER — FUROSEMIDE 40 MG PO TABS
40.0000 mg | ORAL_TABLET | Freq: Two times a day (BID) | ORAL | Status: DC
  Administered 2013-06-13 – 2013-06-14 (×2): 40 mg via ORAL
  Filled 2013-06-13 (×4): qty 1

## 2013-06-13 MED ORDER — HEPARIN SODIUM (PORCINE) 5000 UNIT/ML IJ SOLN
5000.0000 [IU] | Freq: Three times a day (TID) | INTRAMUSCULAR | Status: DC
  Administered 2013-06-13 – 2013-06-14 (×4): 5000 [IU] via SUBCUTANEOUS
  Filled 2013-06-13 (×6): qty 1

## 2013-06-13 MED ORDER — ACETAMINOPHEN 325 MG PO TABS
650.0000 mg | ORAL_TABLET | ORAL | Status: DC | PRN
  Administered 2013-06-13 – 2013-06-15 (×6): 650 mg via ORAL
  Filled 2013-06-13 (×6): qty 2

## 2013-06-13 MED ORDER — VITAMIN B-6 100 MG PO TABS
500.0000 mg | ORAL_TABLET | Freq: Every day | ORAL | Status: DC
  Administered 2013-06-13 – 2013-06-16 (×4): 500 mg via ORAL
  Filled 2013-06-13 (×4): qty 5

## 2013-06-13 MED ORDER — ZOLPIDEM TARTRATE 5 MG PO TABS
5.0000 mg | ORAL_TABLET | Freq: Every evening | ORAL | Status: DC | PRN
  Administered 2013-06-13 – 2013-06-15 (×4): 5 mg via ORAL
  Filled 2013-06-13 (×4): qty 1

## 2013-06-13 MED ORDER — LORAZEPAM 2 MG/ML IJ SOLN
2.0000 mg | Freq: Once | INTRAMUSCULAR | Status: AC
Start: 2013-06-13 — End: 2013-06-13
  Administered 2013-06-13: 2 mg via INTRAVENOUS
  Filled 2013-06-13: qty 1

## 2013-06-13 MED ORDER — URELLE 81 MG PO TABS
1.0000 | ORAL_TABLET | Freq: Three times a day (TID) | ORAL | Status: DC
  Administered 2013-06-13 – 2013-06-16 (×15): 81 mg via ORAL
  Filled 2013-06-13 (×17): qty 1

## 2013-06-13 MED ORDER — ENALAPRIL MALEATE 20 MG PO TABS
20.0000 mg | ORAL_TABLET | Freq: Every day | ORAL | Status: DC
  Administered 2013-06-13 – 2013-06-16 (×4): 20 mg via ORAL
  Filled 2013-06-13 (×4): qty 1

## 2013-06-13 MED ORDER — FERROUS SULFATE 325 (65 FE) MG PO TABS
325.0000 mg | ORAL_TABLET | Freq: Every day | ORAL | Status: DC
  Administered 2013-06-13 – 2013-06-16 (×4): 325 mg via ORAL
  Filled 2013-06-13 (×5): qty 1

## 2013-06-13 MED ORDER — PANTOPRAZOLE SODIUM 40 MG PO TBEC
40.0000 mg | DELAYED_RELEASE_TABLET | Freq: Every day | ORAL | Status: DC
  Administered 2013-06-13 – 2013-06-16 (×4): 40 mg via ORAL
  Filled 2013-06-13 (×3): qty 1

## 2013-06-13 MED ORDER — METOCLOPRAMIDE HCL 10 MG PO TABS
10.0000 mg | ORAL_TABLET | Freq: Four times a day (QID) | ORAL | Status: DC | PRN
  Administered 2013-06-13 – 2013-06-15 (×2): 10 mg via ORAL
  Filled 2013-06-13 (×3): qty 1

## 2013-06-13 MED ORDER — CHOLECALCIFEROL 10 MCG (400 UNIT) PO TABS
400.0000 [IU] | ORAL_TABLET | Freq: Every day | ORAL | Status: DC
  Administered 2013-06-13 – 2013-06-16 (×4): 400 [IU] via ORAL
  Filled 2013-06-13 (×4): qty 1

## 2013-06-13 MED ORDER — CARVEDILOL 12.5 MG PO TABS
12.5000 mg | ORAL_TABLET | Freq: Two times a day (BID) | ORAL | Status: DC
  Administered 2013-06-13 – 2013-06-16 (×8): 12.5 mg via ORAL
  Filled 2013-06-13 (×10): qty 1

## 2013-06-13 MED ORDER — CLOTRIMAZOLE 1 % EX CREA: TOPICAL_CREAM | Freq: Two times a day (BID) | CUTANEOUS | Status: DC

## 2013-06-13 MED ORDER — DEXTROSE 5 % IV SOLN
1.0000 g | INTRAVENOUS | Status: DC
  Administered 2013-06-13 – 2013-06-15 (×3): 1 g via INTRAVENOUS
  Filled 2013-06-13 (×4): qty 10

## 2013-06-13 MED ORDER — LORAZEPAM 2 MG/ML IJ SOLN
2.0000 mg | Freq: Once | INTRAMUSCULAR | Status: AC
  Administered 2013-06-13: 2 mg via INTRAVENOUS
  Filled 2013-06-13: qty 1

## 2013-06-13 NOTE — Progress Notes (Signed)
[facility] PROGRESS NOTE  Gary SCHRIEVER Sr. [id number] DOB: 1929-07-13 DOA: 06/12/2013 PCP: Garnet Koyanagi, DO  Assessment/Plan: 1. Pancreatitis and pancreatic mass with abdominal ascites:  - admitted to the med surg.  - MRI abdomen ordered. GI consulted , recommended paracentesis for diagnostic purposes.  - he is currently asymptomatic.   2. Hypertension - controlled.   3. UTI:  - CONTINUE with rocephin.   4. H/o systolic heart failure: - resume lasix , coreg. h e appears to be compensated.   DVT prophylaxis.   Code Status: full code Family Communication: discussed with the family at bedside Disposition Plan: pending further investigation.    Consultants:  gastroenterology  Procedures:  MRI abdomen  Antibiotics:  none  HPI/Subjective: Comfortable.   Objective: Filed Vitals:   06/13/13 0959  BP: 114/66  Pulse:   Temp:   Resp:     Intake/Output Summary (Last 24 hours) at 06/13/13 1616 Last data filed at 06/13/13 0528  Gross per 24 hour  Intake      0 ml  Output    300 ml  Net   -300 ml   Filed Weights   06/12/13 1717 06/13/13 0019  Weight: 99.791 kg (220 lb) 103.148 kg (227 lb 6.4 oz)    Exam:   General:  Alert afebrile comfortable  Cardiovascular: s1s2  Respiratory: ctab   Abdomen: distended, soft bs+, non tender  Musculoskeletal: 3+ pedal edema. Erythematous rash over the lower extremities.   Data Reviewed: Basic Metabolic Panel:  Recent Labs Lab 06/12/13 1730 06/13/13 0639  NA 137  --   K 5.3  --   CL 97  --   CO2 30  --   GLUCOSE 126*  --   BUN 32*  --   CREATININE 1.80* 1.56*  CALCIUM 9.8  --    Liver Function Tests:  Recent Labs Lab 06/12/13 1730  AST 19  ALT 7  ALKPHOS 102  BILITOT 0.7  PROT 6.9  ALBUMIN 3.7    Recent Labs Lab 06/12/13 2119  LIPASE 46   No results found for this basename: AMMONIA,  in the last 168 hours CBC:  Recent Labs Lab 06/12/13 1730 06/13/13 0639  WBC 5.2 5.1   NEUTROABS 3.7  --   HGB 10.9* 10.2*  HCT 34.8* 33.0*  MCV 82.5 81.9  PLT 216 209   Cardiac Enzymes: No results found for this basename: CKTOTAL, CKMB, CKMBINDEX, TROPONINI,  in the last 168 hours BNP (last 3 results) No results found for this basename: PROBNP,  in the last 8760 hours CBG: No results found for this basename: GLUCAP,  in the last 168 hours  No results found for this or any previous visit (from the past 240 hour(s)).   Studies: Ct Abdomen Pelvis Wo Contrast  06/12/2013   CLINICAL DATA:  Bloating  EXAM: CT ABDOMEN AND PELVIS WITHOUT CONTRAST  TECHNIQUE: Multidetector CT imaging of the abdomen and pelvis was performed following the standard protocol without IV contrast.  COMPARISON:  None.  FINDINGS: Moderate ascites within the abdomen.  There is stranding associated with the pancreatic head. Fat planes in the pancreatic at are obscured. Findings are worrisome for pancreatitis. There is a loculated fluid collection in the region of the uncinate process of the pancreas measuring 3.6 x 3.2 cm. There are no air bubbles within the fluid collection. Fluid extends from this area adjacent to the IVC on image 33. There is a large amount of free fluid in the lesser sac. The  omentum is somewhat abnormal. See image 31. Within the omental fat, there are areas of either soft tissue or fluid density. Omental caking and peritoneal tumor would be difficult to exclude.  Foley catheter decompresses the bladder. The prostate is prominent. It measures 7.2 cm in diameter.  Periportal edema in the liver is suspected. Nonspecific 1.1 x 2.1 cm hypodensity in the right lobe of the liver on image 32.  Spleen is within normal limits.  Adrenal glands are unremarkable.  Kidneys are atrophic. Simple cyst in the upper pole of the left kidney.  Atherosclerotic vascular calcifications are noted. Small hiatal hernia is present.  Small right pleural effusion.  Degenerative changes in the lumbar spine. No vertebral  compression deformity. Sclerotic focus in the left iliac bone.  IMPRESSION: There are inflammatory changes in the pancreas. Correlate with amylase and lipase.  3.6 cm fluid collection adjacent to the uncinate process of the pancreas. This may represent a pseudocyst, however follow-up studies to ensure resolution are recommended.  Large amount of free fluid in the abdomen including the lesser sac. The omentum may be abnormal. Omental spread of tumor cannot be excluded.  Prostate gland is prominent.   Electronically Signed   By: Maryclare Bean M.D.   On: 06/12/2013 20:52   Dg Abd Acute W/chest  06/12/2013   CLINICAL DATA:  Low pain and distention.  Nausea.  EXAM: ACUTE ABDOMEN SERIES (ABDOMEN 2 VIEW & CHEST 1 VIEW)  COMPARISON:  None.  FINDINGS: There is cardiomegaly. Prior CABG. Pulmonary vascularity is normal. Slight blunting of the right costophrenic angle may represent a small pleural effusion.  No free air in the abdomen. There is air in nondistended large and small bowel loops. There is moderate air in the stomach. Overall density in the abdomen suggest the possibility of ascites but this is not definitive.  There are degenerative changes in the spine but there is no acute osseous abnormality.  IMPRESSION: 1. Probable small right pleural effusion. 2. Possible ascites.   Electronically Signed   By: Rozetta Nunnery M.D.   On: 06/12/2013 18:56    Scheduled Meds: . atorvastatin  20 mg Oral Daily  . carvedilol  12.5 mg Oral BID WC  . cholecalciferol  400 Units Oral Daily  . enalapril  20 mg Oral Daily  . ferrous sulfate  325 mg Oral Q breakfast  . folic acid  1 mg Oral Daily  . heparin  5,000 Units Subcutaneous 3 times per day  . pantoprazole  40 mg Oral Daily  . vitamin B-6  500 mg Oral Daily  . URELLE  1 tablet Oral TID AC & HS   Continuous Infusions:   Principal Problem:   Pancreatitis Active Problems:   HYPERLIPIDEMIA   HYPERTENSION   CARDIOMYOPATHY, ISCHEMIC   LBBB   FIBRILLATION, ATRIAL    GERD   Recurrent UTI   Abdominal mass    Time spent: 108min    Camella Seim  [facility] Pager [phone]. If 7PM-7AM, please contact night-coverage at [email or web address], password [facility] 06/13/2013, 4:16 PM  LOS: 1 day

## 2013-06-13 NOTE — Procedures (Signed)
US guided diagnostic paracentesis performed yielding 200 cc's turbid, amber fluid. The fluid was sent to the lab for preordered studies. No immediate complications. Dermabond was applied over puncture site postprocedure to prevent future leakage of ascites.

## 2013-06-13 NOTE — Consult Note (Signed)
Reason for Consult: Pancreatic cystic lesion. Referring Physician: [place]. HPI: This an 80 year old male with a PMH of CHF with an EF of 20%, CAD, ischemic cardiomyopathy, HTN, Afib, and neuorgenic bladder admitted for increasing abdominal distension.  The distension occurred rather rapidly and it did not respond to lasix.  Rather than follow up with PCP he presented to the ER for further evaluation and treatment.  A CT scan was performed and he was identified to have ascites and a 3.6 cm cystic lesion in the uncinate process of the pancreas.  No complaints of any abdominal pain.  His lipase is 46 and he denies any abdominal pain, but the CT scan does reveal some stranding at the head of the pancreas.  As a result of the CT scan findings a GI consultation was requested.  Past Medical History  Diagnosis Date  . Neurogenic bladder     has had bladder catheter changes at the urology office every 12 weeks  . Anemia   . Hyperlipidemia   . Hypertension   . Heart attack     1983  . Chronic systolic heart failure     a. prior EF 20-30%;  b. Echo 7/09: EF 35-40%, basal inferoposterior HK, mild AI, mild to moderate MR, moderate to marked LAE, mild to moderate TR, mildly increased PASP, moderate RAE;   c. Echo 01/2012:   mild LVH, EF 50%, Tr AI, mild MR, severe LAE, mod to severe RAE, mod to severe RVE, mod reduced RVSF, mod to severe TR, PASP 46  . Arrhythmia     afib  . Warfarin anticoagulation   . GERD (gastroesophageal reflux disease)   . Anxiety   . Renal failure     Past Surgical History  Procedure Laterality Date  . Appendectomy    . Cholecystectomy      1971  . Coronary artery bypass graft      1995    Family History  Problem Relation Age of Onset  . Cancer Mother   . Heart attack Father     deceased at 51, smoking, ETOH abuse  . Breast cancer Sister     Social History:  reports that he has quit smoking. He does not have any smokeless tobacco  history on file. He reports that he does not drink alcohol or use illicit drugs.  Allergies:  Allergies  Allergen Reactions  . Amoxicillin     REACTION: ha/nausea  . Diltiazem Hcl     REACTION: low bp  . Hydrocodone-Acetaminophen     REACTION: ha  . Niacin     REACTION: ha/nausea  . Niacin-Lovastatin Er     REACTION: ha/nausea  . Paroxetine     REACTION: ha/disoriented  . Propoxyphene N-Acetaminophen     REACTION: headache  . Pseudoephedrine     REACTION: stopped urine flow  . Sulfonamide Derivatives     REACTION: ha/nausea  . Tolterodine Tartrate     REACTION: bladder stopped    Medications:  Scheduled: . atorvastatin  20 mg Oral Daily  . carvedilol  12.5 mg Oral BID WC  . cholecalciferol  400 Units Oral Daily  . enalapril  20 mg Oral Daily  . ferrous sulfate  325 mg Oral Q breakfast  . folic acid  1 mg Oral Daily  . heparin  5,000 Units Subcutaneous 3 times per day  . LORazepam  2 mg Intravenous Once  . pantoprazole  40 mg Oral Daily  .  vitamin B-6  500 mg Oral Daily  . URELLE  1 tablet Oral TID AC & HS   Continuous:   Results for orders placed during the hospital encounter of 06/12/13 (from the past 24 hour(s))  CBC WITH DIFFERENTIAL     Status: Abnormal   Collection Time    06/12/13  5:30 PM      Result Value Ref Range   WBC 5.2  4.0 - 10.5 K/uL   RBC 4.22  4.22 - 5.81 MIL/uL   Hemoglobin 10.9 (*) 13.0 - 17.0 g/dL   HCT 34.8 (*) 39.0 - 52.0 %   MCV 82.5  78.0 - 100.0 fL   MCH 25.8 (*) 26.0 - 34.0 pg   MCHC 31.3  30.0 - 36.0 g/dL   RDW 16.8 (*) 11.5 - 15.5 %   Platelets 216  150 - 400 K/uL   Neutrophils Relative % 71  43 - 77 %   Neutro Abs 3.7  1.7 - 7.7 K/uL   Lymphocytes Relative 18  12 - 46 %   Lymphs Abs 0.9  0.7 - 4.0 K/uL   Monocytes Relative 10  3 - 12 %   Monocytes Absolute 0.5  0.1 - 1.0 K/uL   Eosinophils Relative 2  0 - 5 %   Eosinophils Absolute 0.1  0.0 - 0.7 K/uL   Basophils Relative 0  0 - 1 %   Basophils Absolute 0.0  0.0 - 0.1  K/uL  COMPREHENSIVE METABOLIC PANEL     Status: Abnormal   Collection Time    06/12/13  5:30 PM      Result Value Ref Range   Sodium 137  137 - 147 mEq/L   Potassium 5.3  3.7 - 5.3 mEq/L   Chloride 97  96 - 112 mEq/L   CO2 30  19 - 32 mEq/L   Glucose, Bld 126 (*) 70 - 99 mg/dL   BUN 32 (*) 6 - 23 mg/dL   Creatinine, Ser 1.80 (*) 0.50 - 1.35 mg/dL   Calcium 9.8  8.4 - 10.5 mg/dL   Total Protein 6.9  6.0 - 8.3 g/dL   Albumin 3.7  3.5 - 5.2 g/dL   AST 19  0 - 37 U/L   ALT 7  0 - 53 U/L   Alkaline Phosphatase 102  39 - 117 U/L   Total Bilirubin 0.7  0.3 - 1.2 mg/dL   GFR calc non Af Amer 33 (*) >90 mL/min   GFR calc Af Amer 39 (*) >90 mL/min  URINALYSIS, ROUTINE W REFLEX MICROSCOPIC     Status: Abnormal   Collection Time    06/12/13  6:05 PM      Result Value Ref Range   Color, Urine GREEN (*) YELLOW   APPearance CLOUDY (*) CLEAR   Specific Gravity, Urine 1.017  1.005 - 1.030   pH 5.5  5.0 - 8.0   Glucose, UA NEGATIVE  NEGATIVE mg/dL   Hgb urine dipstick LARGE (*) NEGATIVE   Bilirubin Urine NEGATIVE  NEGATIVE   Ketones, ur NEGATIVE  NEGATIVE mg/dL   Protein, ur 100 (*) NEGATIVE mg/dL   Urobilinogen, UA 0.2  0.0 - 1.0 mg/dL   Nitrite POSITIVE (*) NEGATIVE   Leukocytes, UA LARGE (*) NEGATIVE  URINE MICROSCOPIC-ADD ON     Status: Abnormal   Collection Time    06/12/13  6:05 PM      Result Value Ref Range   Squamous Epithelial / LPF FEW (*) RARE   WBC,  UA TOO NUMEROUS TO COUNT  <3 WBC/hpf   RBC / HPF 11-20  <3 RBC/hpf   Bacteria, UA MANY (*) RARE   Casts HYALINE CASTS (*) NEGATIVE  I-STAT CG4 LACTIC ACID, ED     Status: None   Collection Time    06/12/13  6:16 PM      Result Value Ref Range   Lactic Acid, Venous 1.45  0.5 - 2.2 mmol/L  LIPASE, BLOOD     Status: None   Collection Time    06/12/13  9:19 PM      Result Value Ref Range   Lipase 46  11 - 59 U/L  CBC     Status: Abnormal   Collection Time    06/13/13  6:39 AM      Result Value Ref Range   WBC 5.1  4.0 -  10.5 K/uL   RBC 4.03 (*) 4.22 - 5.81 MIL/uL   Hemoglobin 10.2 (*) 13.0 - 17.0 g/dL   HCT 33.0 (*) 39.0 - 52.0 %   MCV 81.9  78.0 - 100.0 fL   MCH 25.3 (*) 26.0 - 34.0 pg   MCHC 30.9  30.0 - 36.0 g/dL   RDW 16.4 (*) 11.5 - 15.5 %   Platelets 209  150 - 400 K/uL  CREATININE, SERUM     Status: Abnormal   Collection Time    06/13/13  6:39 AM      Result Value Ref Range   Creatinine, Ser 1.56 (*) 0.50 - 1.35 mg/dL   GFR calc non Af Amer 40 (*) >90 mL/min   GFR calc Af Amer 46 (*) >90 mL/min     Ct Abdomen Pelvis Wo Contrast  06/12/2013   CLINICAL DATA:  Bloating  EXAM: CT ABDOMEN AND PELVIS WITHOUT CONTRAST  TECHNIQUE: Multidetector CT imaging of the abdomen and pelvis was performed following the standard protocol without IV contrast.  COMPARISON:  None.  FINDINGS: Moderate ascites within the abdomen.  There is stranding associated with the pancreatic head. Fat planes in the pancreatic at are obscured. Findings are worrisome for pancreatitis. There is a loculated fluid collection in the region of the uncinate process of the pancreas measuring 3.6 x 3.2 cm. There are no air bubbles within the fluid collection. Fluid extends from this area adjacent to the IVC on image 33. There is a large amount of free fluid in the lesser sac. The omentum is somewhat abnormal. See image 31. Within the omental fat, there are areas of either soft tissue or fluid density. Omental caking and peritoneal tumor would be difficult to exclude.  Foley catheter decompresses the bladder. The prostate is prominent. It measures 7.2 cm in diameter.  Periportal edema in the liver is suspected. Nonspecific 1.1 x 2.1 cm hypodensity in the right lobe of the liver on image 32.  Spleen is within normal limits.  Adrenal glands are unremarkable.  Kidneys are atrophic. Simple cyst in the upper pole of the left kidney.  Atherosclerotic vascular calcifications are noted. Small hiatal hernia is present.  Small right pleural effusion.   Degenerative changes in the lumbar spine. No vertebral compression deformity. Sclerotic focus in the left iliac bone.  IMPRESSION: There are inflammatory changes in the pancreas. Correlate with amylase and lipase.  3.6 cm fluid collection adjacent to the uncinate process of the pancreas. This may represent a pseudocyst, however follow-up studies to ensure resolution are recommended.  Large amount of free fluid in the abdomen including the lesser sac. The omentum  may be abnormal. Omental spread of tumor cannot be excluded.  Prostate gland is prominent.   Electronically Signed   By: Maryclare Bean M.D.   On: 06/12/2013 20:52   Dg Abd Acute W/chest  06/12/2013   CLINICAL DATA:  Low pain and distention.  Nausea.  EXAM: ACUTE ABDOMEN SERIES (ABDOMEN 2 VIEW & CHEST 1 VIEW)  COMPARISON:  None.  FINDINGS: There is cardiomegaly. Prior CABG. Pulmonary vascularity is normal. Slight blunting of the right costophrenic angle may represent a small pleural effusion.  No free air in the abdomen. There is air in nondistended large and small bowel loops. There is moderate air in the stomach. Overall density in the abdomen suggest the possibility of ascites but this is not definitive.  There are degenerative changes in the spine but there is no acute osseous abnormality.  IMPRESSION: 1. Probable small right pleural effusion. 2. Possible ascites.   Electronically Signed   By: Rozetta Nunnery M.D.   On: 06/12/2013 18:56    ROS:  As stated above in the HPI otherwise negative.  Blood pressure 114/66, pulse 79, temperature 97.7 F (36.5 C), temperature source Oral, resp. rate 18, height 6\' 2"  (1.88 m), weight 227 lb 6.4 oz (103.148 kg), SpO2 92.00%.    PE: Gen: NAD, Alert and Oriented HEENT:  [place]/AT, EOMI Neck: Supple, no LAD Lungs: CTA Bilaterally CV: Irreg Irreg ABM: Soft, distended with ascites, +BS Ext: No C/C/E  Assessment/Plan: 1) Pancreatic complex cystic lesion. 2) Ascites. 3) CHF.   I am concerned that the patient  has a malignant ascites.  Further evaluation of the ascitic fluid will be of the most benefit at this time.  Further evaluation of the cystic lesion can be performed with an EUS if the fluid is not diagnostic or points to a pancreatic malignancy.    Plan: 1) Diagnostic paracentesis.   Beryle Beams 06/13/2013, 10:55 AM

## 2013-06-13 NOTE — Progress Notes (Signed)
Ativan given before MRI

## 2013-06-13 NOTE — Progress Notes (Signed)
Pt admitted to the unit with son. Pt is stable, alert and oriented per baseline. Oriented to room, staff, and call bell. Educated to call for any assistance. Bed in lowest position, call bell within reach- will continue to monitor.

## 2013-06-13 NOTE — H&P (Signed)
[facility] History and Physical  Gary [name] [id number] DOB: 1929/04/13    PCP:   Garnet Koyanagi, DO   Chief Complaint: abdominal distention and bilateral pedal edema.  HPI: Gary RAYMUNDO Sr. is an 80 y.o. male with hx of CAD, ischemic cardiomyopathy, s/p CCY, HTN,  CHF, hyperlipidemia, neurogenic bladder, afib on chronic anticoagualation with coumadin, presents to [facility] because of abdominal distention.  He actually was rather asymptomatic.  Family noted abdominal distention and call his PCP, who recommended that he can increase his Lasix.  Family decided instead to go to the urgent care at [facility].  Work up there included an abdominal CT, which showed a 3.6cm complex cystic structure at the pancreatic ucinate.  He has possible ascites, and there is evidence of pancreatitis.  His lipase was normal.  His WBC was normal, Hb of 10.9 g per dL, Cr was elevated to 1.8.  His LFTs were normal.  He did have a colonoscopy before, but it was in 2007.  He had normal BM, and has no weight loss.  Hospitalsit was asked to admit him to expedite the work up of his abdominal problem.  Rewiew of Systems:  Constitutional: Negative for malaise, fever and chills. No significant weight loss or weight gain Eyes: Negative for eye pain, redness and discharge, diplopia, visual changes, or flashes of light. ENMT: Negative for ear pain, hoarseness, nasal congestion, sinus pressure and sore throat. No headaches; tinnitus, drooling, or problem swallowing. Cardiovascular: Negative for chest pain, palpitations, diaphoresis, dyspnea and peripheral edema. ; No orthopnea, PND Respiratory: Negative for cough, hemoptysis, wheezing and stridor. No pleuritic chestpain. Gastrointestinal: Negative for vomiting, diarrhea, constipation, melena, blood in stool, hematemesis, jaundice and rectal bleeding.    Genitourinary: Negative for frequency, dysuria, incontinence,flank pain and hematuria; Musculoskeletal: Negative for back  pain and neck pain. Negative for swelling and trauma.;  Skin: . Negative for pruritus, rash, abrasions, bruising and skin lesion.; ulcerations Neuro: Negative for headache, lightheadedness and neck stiffness. Negative for weakness, altered level of consciousness , altered mental status, extremity weakness, burning feet, involuntary movement, seizure and syncope.  Psych: negative for anxiety, depression, insomnia, tearfulness, panic attacks, hallucinations, paranoia, suicidal or homicidal ideation    Past Medical History  Diagnosis Date  . Neurogenic bladder     has had bladder catheter changes at the urology office every 12 weeks  . Anemia   . Hyperlipidemia   . Hypertension   . Heart attack     1983  . Chronic systolic heart failure     a. prior EF 20-30%;  b. Echo 7/09: EF 35-40%, basal inferoposterior HK, mild AI, mild to moderate MR, moderate to marked LAE, mild to moderate TR, mildly increased PASP, moderate RAE;   c. Echo 01/2012:   mild LVH, EF 50%, Tr AI, mild MR, severe LAE, mod to severe RAE, mod to severe RVE, mod reduced RVSF, mod to severe TR, PASP 46  . Arrhythmia     afib  . Warfarin anticoagulation   . GERD (gastroesophageal reflux disease)   . Anxiety   . Renal failure     Past Surgical History  Procedure Laterality Date  . Appendectomy    . Cholecystectomy      1971  . Coronary artery bypass graft      1995    Medications:  HOME MEDS: Prior to Admission medications   Medication Sig Start Date End Date Taking? Authorizing Provider  acetaminophen (TYLENOL) 500 MG tablet Take 650 mg by  mouth as needed. For Arthritis    Historical Provider, MD  atorvastatin (LIPITOR) 20 MG tablet Take 1 tablet (20 mg total) by mouth daily. 01/15/13   [name], MD  carvedilol (COREG) 12.5 MG tablet TAKE 1 TABLET BY MOUTH TWICE DAILY WITH MEALS 05/02/13   Rosalita Chessman, DO  Cholecalciferol (VITAMIN D) 1000 UNITS capsule Take 200 Units by mouth daily.     Historical  Provider, MD  clonazePAM (KLONOPIN) 1 MG tablet TAKE 1 TABLET BY MOUTH TWICE DAILY AS NEEDED FOR ANXIETY 03/30/13   Rosalita Chessman, DO  clotrimazole-betamethasone (LOTRISONE) cream Apply topically 2 (two) times daily. 12/05/12   Rosalita Chessman, DO  collagenase (SANTYL) ointment Apply topically daily. 07/18/12   Rosalita Chessman, DO  diphenhydrAMINE (BENADRYL) 25 MG tablet Take 25 mg by mouth every 6 (six) hours as needed.      Historical Provider, MD  enalapril (VASOTEC) 20 MG tablet TAKE 1 TABLET BY MOUTH TWICE DAILY 05/26/13   [name], MD  erythromycin base (E-MYCIN) 500 MG tablet Take 1 tablet (500 mg total) by mouth 2 (two) times daily. 05/12/13   Rosalita Chessman, DO  ferrous sulfate 325 (65 FE) MG tablet Take 325 mg by mouth daily with breakfast.     Historical Provider, MD  folic acid (FOLVITE) 1 MG tablet Take 1 mg by mouth daily.      Historical Provider, MD  furosemide (LASIX) 40 MG tablet TAKE 2 TABLETS BY MOUTH DAILY 01/30/13   [name], MD  hydrocortisone (CORTIZONE-10) 1 % ointment Apply 1 application topically 2 (two) times daily.    Historical Provider, MD  lidocaine (XYLOCAINE) 2 % jelly APPLY TOPICALLY AS NEEDED 02/21/13   Rosalita Chessman, DO  Meth-Hyo-M Bl-Na Phos-Ph Sal (URIBEL) 118 MG CAPS Take 4 capsules by mouth daily.  05/02/12   Historical Provider, MD  Misc Natural Products (OSTEO BI-FLEX ADV DOUBLE ST PO) Take by mouth.      Historical Provider, MD  Multiple Vitamin (MULTIVITAMIN) capsule Take 1 capsule by mouth daily.      Historical Provider, MD  neomycin-bacitracin-polymyxin (NEOSPORIN) ointment Apply topically every 12 (twelve) hours. apply to eye    Historical Provider, MD  nystatin (MYCOSTATIN/NYSTOP) 100000 UNIT/GM POWD Use on affected area tid 11/14/12   Rosalita Chessman, DO  nystatin-triamcinolone ointment (MYCOLOG) APPLY EXTERNALLY TO THE AFFECTED AREA TWICE DAILY 02/07/13   Alferd Apa Lowne, DO  omeprazole (PRILOSEC) 20 MG capsule TAKE 1 CAPSULE BY MOUTH TWICE  DAILY    [name], DO  ondansetron (ZOFRAN) 8 MG tablet TAKE 1 TABLET BY MOUTH EVERY 12 HOURS AS NEEDED FOR NAUSEA 03/30/13   Rosalita Chessman, DO  permethrin (ELIMITE) 5 % cream Apply from neck to soles of feet was off in 8-14 hours, repeat in 2 weeks prn 10/23/12   Midge Minium, MD  Pyridoxine HCl (VITAMIN B-6) 500 MG tablet Take 500 mg by mouth daily.      Historical Provider, MD  triamcinolone cream (KENALOG) 0.1 % APPLY TO THE AFFECTED AREA TWICE DAILY AS NEEDED. NOT TO FACE, GROIN OR UNDERARMS 05/02/13   Rosalita Chessman, DO  warfarin (COUMADIN) 2.5 MG tablet TAKE 1 TABLET BY MOUTH EVERY DAY 04/22/13   Rosalita Chessman, DO     Allergies:  Allergies  Allergen Reactions  . Amoxicillin     REACTION: ha/nausea  . Diltiazem Hcl     REACTION: low bp  . Hydrocodone-Acetaminophen  REACTION: ha  . Niacin     REACTION: ha/nausea  . Niacin-Lovastatin Er     REACTION: ha/nausea  . Paroxetine     REACTION: ha/disoriented  . Propoxyphene N-Acetaminophen     REACTION: headache  . Pseudoephedrine     REACTION: stopped urine flow  . Sulfonamide Derivatives     REACTION: ha/nausea  . Tolterodine Tartrate     REACTION: bladder stopped    Social History:   reports that he has quit smoking. He does not have any smokeless tobacco history on file. He reports that he does not drink alcohol or use illicit drugs.  Family History: Family History  Problem Relation Age of Onset  . Cancer Mother   . Heart attack Father     deceased at 30, smoking, ETOH abuse  . Breast cancer Sister      Physical Exam: Filed Vitals:   06/12/13 1717 06/12/13 2031 06/12/13 2251 06/13/13 0019  BP: 116/83 144/69 133/65 133/88  Pulse: 88 89 90 84  Temp: 97.7 F (36.5 C) 98.2 F (36.8 C)  97.9 F (36.6 C)  TempSrc: Oral Oral  Oral  Resp: 16 16 16 18   Height:    6\' 2"  (1.88 m)  Weight: 99.791 kg (220 lb)   103.148 kg (227 lb 6.4 oz)  SpO2: 99% 100% 97% 97%   Blood pressure 133/88, pulse 84,  temperature 97.9 F (36.6 C), temperature source Oral, resp. rate 18, height 6\' 2"  (1.88 m), weight 103.148 kg (227 lb 6.4 oz), SpO2 97.00%.  GEN:  Pleasant patient lying in the stretcher in no acute distress; cooperative with exam. PSYCH:  alert and oriented x4; does not appear anxious or depressed; affect is appropriate. HEENT: Mucous membranes pink and anicteric; PERRLA; EOM intact; no cervical lymphadenopathy nor thyromegaly or carotid bruit; no JVD; There were no stridor. Neck is very supple. Breasts:: Not examined CHEST WALL: No tenderness CHEST: Normal respiration, clear to auscultation bilaterally.  HEART: Regular rate and rhythm.  There are no murmur, rub, or gallops.   BACK: No kyphosis or scoliosis; no CVA tenderness ABDOMEN: soft and non-tender; distended, hypertympanic upper, dull to percussion lower abdomen. normal abdominal bowel sounds; no pannus; no intertriginous candida. There is no rebound and no distention. Rectal Exam: Not done EXTREMITIES: No bone or joint deformity; age-appropriate arthropathy of the hands and knees; no edema; no ulcerations.  There is no calf tenderness. Genitalia: not examined PULSES: 2+ and symmetric SKIN: Normal hydration no rash or ulceration CNS: Cranial nerves 2-12 grossly intact no focal lateralizing neurologic deficit.  Speech is fluent; uvula elevated with phonation, facial symmetry and tongue midline. DTR are normal bilaterally, cerebella exam is intact, barbinski is negative and strengths are equaled bilaterally.  No sensory loss.   Labs on Admission:  Basic Metabolic Panel:  Recent Labs Lab 06/12/13 1730  NA 137  K 5.3  CL 97  CO2 30  GLUCOSE 126*  BUN 32*  CREATININE 1.80*  CALCIUM 9.8   Liver Function Tests:  Recent Labs Lab 06/12/13 1730  AST 19  ALT 7  ALKPHOS 102  BILITOT 0.7  PROT 6.9  ALBUMIN 3.7    Recent Labs Lab 06/12/13 2119  LIPASE 46   No results found for this basename: AMMONIA,  in the last 168  hours CBC:  Recent Labs Lab 06/12/13 1730  WBC 5.2  NEUTROABS 3.7  HGB 10.9*  HCT 34.8*  MCV 82.5  PLT 216   Cardiac Enzymes: No results found for  this basename: CKTOTAL, CKMB, CKMBINDEX, TROPONINI,  in the last 168 hours  CBG: No results found for this basename: GLUCAP,  in the last 168 hours   Radiological Exams on Admission: Ct Abdomen Pelvis Wo Contrast  06/12/2013   CLINICAL DATA:  Bloating  EXAM: CT ABDOMEN AND PELVIS WITHOUT CONTRAST  TECHNIQUE: Multidetector CT imaging of the abdomen and pelvis was performed following the standard protocol without IV contrast.  COMPARISON:  None.  FINDINGS: Moderate ascites within the abdomen.  There is stranding associated with the pancreatic head. Fat planes in the pancreatic at are obscured. Findings are worrisome for pancreatitis. There is a loculated fluid collection in the region of the uncinate process of the pancreas measuring 3.6 x 3.2 cm. There are no air bubbles within the fluid collection. Fluid extends from this area adjacent to the IVC on image 33. There is a large amount of free fluid in the lesser sac. The omentum is somewhat abnormal. See image 31. Within the omental fat, there are areas of either soft tissue or fluid density. Omental caking and peritoneal tumor would be difficult to exclude.  Foley catheter decompresses the bladder. The prostate is prominent. It measures 7.2 cm in diameter.  Periportal edema in the liver is suspected. Nonspecific 1.1 x 2.1 cm hypodensity in the right lobe of the liver on image 32.  Spleen is within normal limits.  Adrenal glands are unremarkable.  Kidneys are atrophic. Simple cyst in the upper pole of the left kidney.  Atherosclerotic vascular calcifications are noted. Small hiatal hernia is present.  Small right pleural effusion.  Degenerative changes in the lumbar spine. No vertebral compression deformity. Sclerotic focus in the left iliac bone.  IMPRESSION: There are inflammatory changes in the  pancreas. Correlate with amylase and lipase.  3.6 cm fluid collection adjacent to the uncinate process of the pancreas. This may represent a pseudocyst, however follow-up studies to ensure resolution are recommended.  Large amount of free fluid in the abdomen including the lesser sac. The omentum may be abnormal. Omental spread of tumor cannot be excluded.  Prostate gland is prominent.   Electronically Signed   By: Maryclare Bean M.D.   On: 06/12/2013 20:52   Dg Abd Acute W/chest  06/12/2013   CLINICAL DATA:  Low pain and distention.  Nausea.  EXAM: ACUTE ABDOMEN SERIES (ABDOMEN 2 VIEW & CHEST 1 VIEW)  COMPARISON:  None.  FINDINGS: There is cardiomegaly. Prior CABG. Pulmonary vascularity is normal. Slight blunting of the right costophrenic angle may represent a small pleural effusion.  No free air in the abdomen. There is air in nondistended large and small bowel loops. There is moderate air in the stomach. Overall density in the abdomen suggest the possibility of ascites but this is not definitive.  There are degenerative changes in the spine but there is no acute osseous abnormality.  IMPRESSION: 1. Probable small right pleural effusion. 2. Possible ascites.   Electronically Signed   By: Rozetta Nunnery M.D.   On: 06/12/2013 18:56    Assessment/Plan Present on Admission:  . Pancreatitis . HYPERLIPIDEMIA . HYPERTENSION . CARDIOMYOPATHY, ISCHEMIC . LBBB . FIBRILLATION, ATRIAL . GERD . Recurrent UTI . Abdominal mass   PLAN:  Will obtain an MRI of the abdomen to further look at the 3.6cm complex cystic structure at the pancreatitc ucinate.   Please consult GI for further recommendation.  I will hold his Coumadin in case the mass can be biopsied.  He is otherwise stable.  Will  admit to general medical floor under [facility] service. I discussed code status with him and his son, and he would like to be a full code. Thank you for asking me to participate in his care.  Other plans as per orders.  Code Status: FULL  CODE   Orvan Falconer, MD. [facility] Pager [phone] 7pm to 7am.  06/13/2013, 1:31 AM

## 2013-06-13 NOTE — Plan of Care (Signed)
Problem: Phase I Progression Outcomes Goal: Voiding-avoid urinary catheter unless indicated Outcome: Completed/Met Date Met:  06/13/13 Pt came into hospital with catheter

## 2013-06-14 DIAGNOSIS — R1900 Intra-abdominal and pelvic swelling, mass and lump, unspecified site: Secondary | ICD-10-CM

## 2013-06-14 DIAGNOSIS — I447 Left bundle-branch block, unspecified: Secondary | ICD-10-CM

## 2013-06-14 DIAGNOSIS — I5042 Chronic combined systolic (congestive) and diastolic (congestive) heart failure: Secondary | ICD-10-CM

## 2013-06-14 DIAGNOSIS — E785 Hyperlipidemia, unspecified: Secondary | ICD-10-CM

## 2013-06-14 DIAGNOSIS — I10 Essential (primary) hypertension: Secondary | ICD-10-CM

## 2013-06-14 DIAGNOSIS — K859 Acute pancreatitis without necrosis or infection, unspecified: Principal | ICD-10-CM

## 2013-06-14 DIAGNOSIS — I059 Rheumatic mitral valve disease, unspecified: Secondary | ICD-10-CM

## 2013-06-14 DIAGNOSIS — I4891 Unspecified atrial fibrillation: Secondary | ICD-10-CM

## 2013-06-14 DIAGNOSIS — R188 Other ascites: Secondary | ICD-10-CM

## 2013-06-14 DIAGNOSIS — L0390 Cellulitis, unspecified: Secondary | ICD-10-CM

## 2013-06-14 DIAGNOSIS — I2589 Other forms of chronic ischemic heart disease: Secondary | ICD-10-CM

## 2013-06-14 DIAGNOSIS — L0291 Cutaneous abscess, unspecified: Secondary | ICD-10-CM

## 2013-06-14 DIAGNOSIS — N19 Unspecified kidney failure: Secondary | ICD-10-CM

## 2013-06-14 DIAGNOSIS — N390 Urinary tract infection, site not specified: Secondary | ICD-10-CM

## 2013-06-14 DIAGNOSIS — I519 Heart disease, unspecified: Secondary | ICD-10-CM

## 2013-06-14 DIAGNOSIS — I079 Rheumatic tricuspid valve disease, unspecified: Secondary | ICD-10-CM

## 2013-06-14 DIAGNOSIS — I25810 Atherosclerosis of coronary artery bypass graft(s) without angina pectoris: Secondary | ICD-10-CM

## 2013-06-14 DIAGNOSIS — I509 Heart failure, unspecified: Secondary | ICD-10-CM

## 2013-06-14 LAB — BASIC METABOLIC PANEL
BUN: 29 mg/dL — ABNORMAL HIGH (ref 6–23)
CALCIUM: 9.6 mg/dL (ref 8.4–10.5)
CO2: 28 meq/L (ref 19–32)
Chloride: 101 mEq/L (ref 96–112)
Creatinine, Ser: 1.56 mg/dL — ABNORMAL HIGH (ref 0.50–1.35)
GFR calc Af Amer: 46 mL/min — ABNORMAL LOW (ref >90)
GFR, EST NON AFRICAN AMERICAN: 40 mL/min — AB (ref >90)
Glucose, Bld: 86 mg/dL (ref 70–99)
Potassium: 4.7 mEq/L (ref 3.7–5.3)
SODIUM: 138 meq/L (ref 137–147)

## 2013-06-14 LAB — CBC
HCT: 31.8 % — ABNORMAL LOW (ref 39.0–52.0)
HEMOGLOBIN: 9.7 g/dL — AB (ref 13.0–17.0)
MCH: 24.7 pg — ABNORMAL LOW (ref 26.0–34.0)
MCHC: 30.5 g/dL (ref 30.0–36.0)
MCV: 80.9 fL (ref 78.0–100.0)
PLATELETS: 174 10*3/uL (ref 150–400)
RBC: 3.93 MIL/uL — AB (ref 4.22–5.81)
RDW: 16.5 % — ABNORMAL HIGH (ref 11.5–15.5)
WBC: 3.7 10*3/uL — AB (ref 4.0–10.5)

## 2013-06-14 LAB — PRO B NATRIURETIC PEPTIDE: PRO B NATRI PEPTIDE: 3928 pg/mL — AB (ref 0–450)

## 2013-06-14 LAB — PROTEIN, BODY FLUID: TOTAL PROTEIN, FLUID: 3.7 g/dL

## 2013-06-14 LAB — AMYLASE, PERITONEAL FLUID: AMYLASE, PERITONEAL FLUID: 18 U/L

## 2013-06-14 MED ORDER — WARFARIN - PHARMACIST DOSING INPATIENT: Freq: Every day | Status: DC

## 2013-06-14 MED ORDER — WARFARIN SODIUM 3 MG PO TABS
3.0000 mg | ORAL_TABLET | Freq: Once | ORAL | Status: AC
  Administered 2013-06-14: 3 mg via ORAL
  Filled 2013-06-14 (×2): qty 1

## 2013-06-14 MED ORDER — FUROSEMIDE 10 MG/ML IJ SOLN
60.0000 mg | Freq: Three times a day (TID) | INTRAMUSCULAR | Status: DC
  Administered 2013-06-14 – 2013-06-15 (×3): 60 mg via INTRAVENOUS
  Filled 2013-06-14 (×5): qty 6

## 2013-06-14 MED ORDER — FUROSEMIDE 10 MG/ML IJ SOLN
40.0000 mg | Freq: Two times a day (BID) | INTRAMUSCULAR | Status: DC
  Administered 2013-06-14: 40 mg via INTRAVENOUS
  Filled 2013-06-14: qty 4

## 2013-06-14 NOTE — Care Management Note (Signed)
    Page 1 of 1   06/16/2013     4:22:42 PM CARE MANAGEMENT NOTE 06/16/2013  Patient:  Gary [name], Gary [name]   Account Number:  [id number]  Date Initiated:  06/14/2013  Documentation initiated by:  GRAVES-BIGELOW,BRENDA  Subjective/Objective Assessment:   Pt admitted for abdominal distention and bilateral pedal edema. Post US guided diagnostic paracentesis performed yielding 200 cc's turbid, amber fluid. Initiated on IV rocephin for UTI.     Action/Plan:   CM will continue to monitor for dispositon needs.   Anticipated DC Date:  06/16/2013   Anticipated DC Plan:  [place]  CM consult      [facility] Choice  Resumption Of Svcs/PTA Provider   Choice offered to / List presented to:  [name]        [place] arranged  HH-1 RN      [place]   Status of service:  Completed, signed off Medicare Important Message given?   (If response is "NO", the following Medicare IM given date fields will be blank) Date Medicare IM given:   Date Additional Medicare IM given:    Discharge Disposition:  [place]  Per UR Regulation:  Reviewed for med. necessity/level of care/duration of stay  If discussed at [place] of Stay Meetings, dates discussed:    Comments:  06/16/13 Gary [name], BSN [phone] patient states he is active with [place] and would like to  continue with Gary [name], [facility] notified that patient is for dc today and will resume HHRN also for BMP on Wed.

## 2013-06-14 NOTE — Progress Notes (Addendum)
Subjective: No acute events  Objective: Vital signs in last 24 hours: Temp:  [97.7 F (36.5 C)-97.8 F (36.6 C)] 97.8 F (36.6 C) (04/18 0300) Pulse Rate:  [79-88] 88 (04/18 0300) Resp:  [18-20] 20 (04/18 0300) BP: (112-124)/(61-74) 124/70 mmHg (04/18 0300) SpO2:  [94 %-99 %] 94 % (04/18 0300) Last BM Date: 06/13/13  Intake/Output from previous day: 04/17 0701 - 04/18 0700 In: 50 [IV Piggyback:50] Out: 200 [Urine:200] Intake/Output this shift:    General appearance: alert and no distress GI: distended, +ascites, no pain  Lab Results:  Recent Labs  06/12/13 1730 06/13/13 0639 06/14/13 0557  WBC 5.2 5.1 3.7*  HGB 10.9* 10.2* 9.7*  HCT 34.8* 33.0* 31.8*  PLT 216 209 174   BMET  Recent Labs  06/12/13 1730 06/13/13 0639 06/14/13 0557  NA 137  --  138  K 5.3  --  4.7  CL 97  --  101  CO2 30  --  28  GLUCOSE 126*  --  86  BUN 32*  --  29*  CREATININE 1.80* 1.56* 1.56*  CALCIUM 9.8  --  9.6   LFT  Recent Labs  06/13/13 1828  PROT 6.7  ALBUMIN 3.5  AST 20  ALT 9  ALKPHOS 98  BILITOT 0.8  BILIDIR 0.3  IBILI 0.5   PT/INR  Recent Labs  06/13/13 1828  LABPROT 25.2*  INR 2.38*   Hepatitis Panel No results found for this basename: HEPBSAG, HCVAB, HEPAIGM, HEPBIGM,  in the last 72 hours C-Diff No results found for this basename: CDIFFTOX,  in the last 72 hours Fecal Lactopherrin No results found for this basename: FECLLACTOFRN,  in the last 72 hours  Studies/Results: Ct Abdomen Pelvis Wo Contrast  06/12/2013   CLINICAL DATA:  Bloating  EXAM: CT ABDOMEN AND PELVIS WITHOUT CONTRAST  TECHNIQUE: Multidetector CT imaging of the abdomen and pelvis was performed following the standard protocol without IV contrast.  COMPARISON:  None.  FINDINGS: Moderate ascites within the abdomen.  There is stranding associated with the pancreatic head. Fat planes in the pancreatic at are obscured. Findings are worrisome for pancreatitis. There is a loculated fluid  collection in the region of the uncinate process of the pancreas measuring 3.6 x 3.2 cm. There are no air bubbles within the fluid collection. Fluid extends from this area adjacent to the IVC on image 33. There is a large amount of free fluid in the lesser sac. The omentum is somewhat abnormal. See image 31. Within the omental fat, there are areas of either soft tissue or fluid density. Omental caking and peritoneal tumor would be difficult to exclude.  Foley catheter decompresses the bladder. The prostate is prominent. It measures 7.2 cm in diameter.  Periportal edema in the liver is suspected. Nonspecific 1.1 x 2.1 cm hypodensity in the right lobe of the liver on image 32.  Spleen is within normal limits.  Adrenal glands are unremarkable.  Kidneys are atrophic. Simple cyst in the upper pole of the left kidney.  Atherosclerotic vascular calcifications are noted. Small hiatal hernia is present.  Small right pleural effusion.  Degenerative changes in the lumbar spine. No vertebral compression deformity. Sclerotic focus in the left iliac bone.  IMPRESSION: There are inflammatory changes in the pancreas. Correlate with amylase and lipase.  3.6 cm fluid collection adjacent to the uncinate process of the pancreas. This may represent a pseudocyst, however follow-up studies to ensure resolution are recommended.  Large amount of free fluid in the abdomen  including the lesser sac. The omentum may be abnormal. Omental spread of tumor cannot be excluded.  Prostate gland is prominent.   Electronically Signed   By: Maryclare Bean M.D.   On: 06/12/2013 20:52   US Paracentesis  06/13/2013   CLINICAL DATA:  History of complex pancreatic cystic lesion, CHF, ascites. Request is made for diagnostic paracentesis.  EXAM: ULTRASOUND GUIDED DIAGNOSTIC PARACENTESIS  COMPARISON:  None.  PROCEDURE: An ultrasound guided paracentesis was thoroughly discussed with the patient and questions answered. The benefits, risks, alternatives and  complications were also discussed. The patient understands and wishes to proceed with the procedure. Written consent was obtained.  Ultrasound was performed to localize and mark an adequate pocket of fluid in the right lower quadrant of the abdomen. The area was then prepped and draped in the normal sterile fashion. 1% Lidocaine was used for local anesthesia. Under ultrasound guidance a 19 gauge Yueh catheter was introduced. Paracentesis was performed. The catheter was removed and a dressing applied.  Complications: None.  FINDINGS: A total of approximately 200 cc's of turbid,amber fluid was removed. The fluid sample was sent for laboratory analysis.  IMPRESSION: Successful ultrasound guided diagnostic paracentesis yielding 200 cc's of ascites. Dermabond was applied over the puncture site post procedure to prevent future leakage of ascites.  Read by: Rowe Robert ,P.A.-C.   Electronically Signed   By: Markus Daft M.D.   On: 06/13/2013 17:17   Dg Abd Acute W/chest  06/12/2013   CLINICAL DATA:  Low pain and distention.  Nausea.  EXAM: ACUTE ABDOMEN SERIES (ABDOMEN 2 VIEW & CHEST 1 VIEW)  COMPARISON:  None.  FINDINGS: There is cardiomegaly. Prior CABG. Pulmonary vascularity is normal. Slight blunting of the right costophrenic angle may represent a small pleural effusion.  No free air in the abdomen. There is air in nondistended large and small bowel loops. There is moderate air in the stomach. Overall density in the abdomen suggest the possibility of ascites but this is not definitive.  There are degenerative changes in the spine but there is no acute osseous abnormality.  IMPRESSION: 1. Probable small right pleural effusion. 2. Possible ascites.   Electronically Signed   By: Rozetta Nunnery M.D.   On: 06/12/2013 18:56    Medications:  Scheduled: . atorvastatin  20 mg Oral Daily  . carvedilol  12.5 mg Oral BID WC  . cefTRIAXone (ROCEPHIN)  IV  1 g Intravenous Q24H  . cholecalciferol  400 Units Oral Daily  .  enalapril  20 mg Oral Daily  . ferrous sulfate  325 mg Oral Q breakfast  . folic acid  1 mg Oral Daily  . furosemide  40 mg Oral BID  . heparin  5,000 Units Subcutaneous 3 times per day  . pantoprazole  40 mg Oral Daily  . vitamin B-6  500 mg Oral Daily  . URELLE  1 tablet Oral TID AC & HS   Continuous:   Assessment/Plan: 1) Ascites. 2) Pancreatic cystic lesion.   The SAAG is at 1.2, but I do not have a total protein result yet.  If the TP is >2.5 it points to a cardiac etiology.  If it is <2.5 it is from portal HTN.  There is no evidence of SBP.  Originally, my thought was that this was malignant, but it does not appear to be the case at this time.  The mildly elevated tumor markers from the peritoneal fluid are not accurate.    Plan: 1) Await TP  result.  I am told that it will be ready in the next few hours.  If it is a cardiac source, a Cardiology consultation will be required. 2) The pancreatic cystic lesion can be further worked up with EUS as an outpatient.   ADDENDUM: The TP is at 3.7.  This appears to be cardiac ascites.  Please consult Cardiology.     LOS: 2 days   Beryle Beams 06/14/2013, 7:20 AM

## 2013-06-14 NOTE — Progress Notes (Signed)
Patient went down for a MRI- radiologist called RN to report that patient's abdomen was too large to fit in machine. That was the second time patient had been down to attempt MRI. Patient had concern about billing because of the number of times he had been downstairs for the MRI.

## 2013-06-14 NOTE — Progress Notes (Signed)
UR Completed Abhiraj Dozal Graves-Bigelow, RN,BSN [phone]  

## 2013-06-14 NOTE — Consult Note (Signed)
CARDIOLOGY CONSULT NOTE  Patient ID: Gary Lope Sr. MRN: [id number] DOB/AGE: 06/16/1929 80 y.o.  Admit date: 06/12/2013 Primary Physician Garnet Koyanagi, DO  Reason for Consultation: ascites deemed cardiac in etiology, CAD, systolic heart failure, atrial fibrillation  HPI: The patient is an 80 year old male with a past medical history significant for coronary artery disease and CABG in 1995, prior history of an ischemic myopathy, permanent atrial fibrillation, left bundle branch block, hypertension, hyperlipidemia, and chronic kidney disease. His most recent echocardiogram was performed in December of 2013 and demonstrated an ejection fraction at 50%, severe left atrial enlargement, moderate to severe right ventricular enlargement with moderately reduced systolic function, moderate to severe atrial enlargement, moderate to severe tricuspid regurgitation, mild mitral regurgitation and trace aortic regurgitation. PASP was 46 mmHg. He was most recently evaluated in the cardiology office by Dr. Stanford Breed in November 2014.  He called his primary care physician on April 16 complaining of swelling in the legs, ankles, and abdomen. He normally takes 80 mg of Lasix daily. He was instructed to take an increased dose for 2-3 days and then go back to usual maintenance dosing. Rather than doing this, the patient was brought by his family to an urgent care, and then to the emergency room at [facility]. Chest x-ray showed a small right pleural effusion and possible abdominal ascites. An abdominal CT showed inflammatory changes within the pancreas, a 3.6 cm fluid collection adjacent to the uncinate process of the pancreas, and a large amount of fluid in the abdomen consistent with ascites. An MRI was ordered but apparently the patient would not fit. Gastroenterology was consulted. He ultimately underwent an ultrasound-guided paracentesis and analysis of the fluid was suggestive of cardiac ascites  (total protein 3.7). Thus, cardiology was consulted.  His son, Gary [name], and daughter in law are present. They have helped to provide much of the history and have helped redirecting, as the patient is a bit tangential with his responses. It appears he had bouts of CHF 10 years ago and then approximately 1.5 years ago, but was not hospitalized for this. The patient has no complaints per se, specifically denying chest pain, palpitations, and shortness of breath. Approximately 4-6 weeks ago, his wife (disabled, whom he helps care for) noticed that his abdomen was distended. He says he normally weighs between 200-225 lbs, and his weight was also going up. He has chronic ankle and leg swelling. He uses a urinary catheter and says he has more output at night. He denies abdominal pain. CT showed a prominent prostate.  While he does not add salt to his food, his family says that he eats a lot of sodium-laden foods, crackers in particular. He had also been eating out frequently, and now maybe once a week at [employer]. He does say he has had a diminished appetite lately.  He tells me he had a stress test approximately 2 years ago which was "normal", although I cannot find records of this.      Allergies  Allergen Reactions  . Amoxicillin     REACTION: ha/nausea  . Diltiazem Hcl     REACTION: low bp  . Hydrocodone-Acetaminophen     REACTION: ha  . Niacin     REACTION: ha/nausea  . Niacin-Lovastatin Er     REACTION: ha/nausea  . Paroxetine     REACTION: ha/disoriented  . Propoxyphene N-Acetaminophen     REACTION: headache  . Pseudoephedrine     REACTION: stopped  urine flow  . Sulfonamide Derivatives     REACTION: ha/nausea  . Tolterodine Tartrate     REACTION: bladder stopped    Current Facility-Administered Medications  Medication Dose Route Frequency Provider Last Rate Last Dose  . acetaminophen (TYLENOL) tablet 650 mg  650 mg Oral Q4H PRN Hosie Poisson, MD   650 mg at 06/14/13 [id number]  .  atorvastatin (LIPITOR) tablet 20 mg  20 mg Oral Daily Orvan Falconer, MD   20 mg at 06/14/13 0920  . carvedilol (COREG) tablet 12.5 mg  12.5 mg Oral BID WC Orvan Falconer, MD   12.5 mg at 06/14/13 [id number]  . cefTRIAXone (ROCEPHIN) 1 g in dextrose 5 % 50 mL IVPB  1 g Intravenous Q24H Hosie Poisson, MD   1 g at 06/13/13 1740  . cholecalciferol (VITAMIN D) tablet 400 Units  400 Units Oral Daily Orvan Falconer, MD   400 Units at 06/14/13 0920  . clonazePAM (KLONOPIN) tablet 1 mg  1 mg Oral BID PRN Orvan Falconer, MD      . enalapril (VASOTEC) tablet 20 mg  20 mg Oral Daily Orvan Falconer, MD   20 mg at 06/14/13 0920  . ferrous sulfate tablet 325 mg  325 mg Oral Q breakfast Orvan Falconer, MD   325 mg at 06/14/13 [id number]  . folic acid (FOLVITE) tablet 1 mg  1 mg Oral Daily Orvan Falconer, MD   1 mg at 06/14/13 0920  . furosemide (LASIX) injection 40 mg  40 mg Intravenous Q12H Hosie Poisson, MD   40 mg at 06/14/13 [id number]  . heparin injection 5,000 Units  5,000 Units Subcutaneous 3 times per day Orvan Falconer, MD   5,000 Units at 06/14/13 1316  . metoCLOPramide (REGLAN) tablet 10 mg  10 mg Oral Q6H PRN Orvan Falconer, MD   10 mg at 06/13/13 0150  . pantoprazole (PROTONIX) EC tablet 40 mg  40 mg Oral Daily Orvan Falconer, MD   40 mg at 06/14/13 0920  . pyridOXINE (VITAMIN B-6) tablet 500 mg  500 mg Oral Daily Orvan Falconer, MD   500 mg at 06/14/13 0920  . URELLE (URELLE/URISED) 81 MG tablet 81 mg  1 tablet Oral TID AC & HS Orvan Falconer, MD   81 mg at 06/14/13 1506  . zolpidem (AMBIEN) tablet 5 mg  5 mg Oral QHS PRN Orvan Falconer, MD   5 mg at 06/13/13 2125    Past Medical History  Diagnosis Date  . Neurogenic bladder     has had bladder catheter changes at the urology office every 12 weeks  . Anemia   . Hyperlipidemia   . Hypertension   . Heart attack     1983  . Chronic systolic heart failure     a. prior EF 20-30%;  b. Echo 7/09: EF 35-40%, basal inferoposterior HK, mild AI, mild to moderate MR, moderate to marked LAE, mild to moderate TR, mildly increased PASP, moderate RAE;    c. Echo 01/2012:   mild LVH, EF 50%, Tr AI, mild MR, severe LAE, mod to severe RAE, mod to severe RVE, mod reduced RVSF, mod to severe TR, PASP 46  . Arrhythmia     afib  . Warfarin anticoagulation   . GERD (gastroesophageal reflux disease)   . Anxiety   . Renal failure     Past Surgical History  Procedure Laterality Date  . Appendectomy    . Cholecystectomy      1971  . Coronary artery bypass graft  1995    History   Social History  . Marital Status: Married    Spouse Name: N/A    Number of Children: N/A  . Years of Education: N/A   Occupational History  . Retired     1994   Social History Main Topics  . Smoking status: Former Research scientist (life sciences)  . Smokeless tobacco: Not on file  . Alcohol Use: No  . Drug Use: No  . Sexual Activity: Not on file   Other Topics Concern  . Not on file   Social History Narrative  . No narrative on file     No family history of premature CAD in 1st degree relatives.  Prior to Admission medications   Medication Sig Start Date End Date Taking? Authorizing Provider  acetaminophen (TYLENOL) 500 MG tablet Take 1,000 mg by mouth 3 (three) times daily.   Yes Historical Provider, MD  atorvastatin (LIPITOR) 20 MG tablet Take 1 tablet (20 mg total) by mouth daily. 01/15/13  Yes [name], MD  carvedilol (COREG) 12.5 MG tablet Take 12.5 mg by mouth 2 (two) times daily with a meal.   Yes Historical Provider, MD  clonazePAM (KLONOPIN) 1 MG tablet Take 1 mg by mouth at bedtime.   Yes Historical Provider, MD  enalapril (VASOTEC) 20 MG tablet Take 20 mg by mouth 2 (two) times daily.   Yes Historical Provider, MD  ferrous sulfate 325 (65 FE) MG tablet Take 325 mg by mouth daily with breakfast.    Yes Historical Provider, MD  folic acid (FOLVITE) 1 MG tablet Take 1 mg by mouth daily.     Yes Historical Provider, MD  furosemide (LASIX) 40 MG tablet Take 80 mg by mouth daily.   Yes Historical Provider, MD  Glucos-Chondroit-Hyaluron-MSM (GLUCOSAMINE  CHONDROITIN JOINT PO) Take 1 tablet by mouth 2 (two) times daily.   Yes Historical Provider, MD  lidocaine (XYLOCAINE) 2 % jelly Place 1 application into the urethra daily as needed (pain).   Yes Historical Provider, MD  Meth-Hyo-M Bl-Na Phos-Ph Sal (URIBEL) 118 MG CAPS Take 1-4 capsules by mouth daily.  05/02/12  Yes Historical Provider, MD  Multiple Vitamin (MULTIVITAMIN) capsule Take 1 capsule by mouth daily.     Yes Historical Provider, MD  ondansetron (ZOFRAN) 8 MG tablet Take 8 mg by mouth every 12 (twelve) hours as needed for nausea or vomiting.   Yes Historical Provider, MD  triamcinolone cream (KENALOG) 0.1 % Apply 1 application topically 2 (two) times daily as needed (rash).   Yes Historical Provider, MD  VITAMIN D, CHOLECALCIFEROL, PO Take 1 tablet by mouth daily.   Yes Historical Provider, MD  warfarin (COUMADIN) 2.5 MG tablet Take 2.5 mg by mouth daily.   Yes Historical Provider, MD     Review of systems complete and found to be negative unless listed above in HPI     Physical exam Blood pressure 135/73, pulse 75, temperature 97.8 F (36.6 C), temperature source Oral, resp. rate 20, height 6\' 2"  (1.88 m), weight 227 lb 6.4 oz (103.148 kg), SpO2 94.00%. General: NAD Neck: +JVD to angle of jaw, no thyromegaly or thyroid nodule.  Lungs: Clear to auscultation bilaterally with normal respiratory effort. CV: Nondisplaced PMI.  Irregular rhythm, normal rate, normal S1, paradoxically split S2, no S3, III/VI apical holosystolic murmur and II/VI pansystolic murmur at lower left sternal border.  2+ pitting pedal, periankle, and dorsal foot edema b/l.  No carotid bruit.   Abdomen: Tense, distended, no tenderness.  Skin: Erythematous  rash on b/l LE's. Neurologic: Alert and oriented x 3.  Psych: Normal affect. Extremities: No clubbing or cyanosis.  HEENT: Normal.   Labs:   Lab Results  Component Value Date   WBC 3.7* 06/14/2013   HGB 9.7* 06/14/2013   HCT 31.8* 06/14/2013   MCV 80.9  06/14/2013   PLT 174 06/14/2013    Recent Labs Lab 06/13/13 1828 06/14/13 0557  NA  --  138  K  --  4.7  CL  --  101  CO2  --  28  BUN  --  29*  CREATININE  --  1.56*  CALCIUM  --  9.6  PROT 6.7  --   BILITOT 0.8  --   ALKPHOS 98  --   ALT 9  --   AST 20  --   GLUCOSE  --  86   No results found for this basename: CKTOTAL, CKMB, CKMBINDEX, TROPONINI    Lab Results  Component Value Date   CHOL 62 05/12/2013   CHOL 95 10/20/2011   CHOL 117 07/08/2009   Lab Results  Component Value Date   HDL 26.90* 05/12/2013   HDL 26.40* 10/20/2011   HDL 30.10* 07/08/2009   Lab Results  Component Value Date   LDLCALC 27 05/12/2013   LDLCALC 52 10/20/2011   LDLCALC 68 07/08/2009   Lab Results  Component Value Date   TRIG 41.0 05/12/2013   TRIG 81.0 10/20/2011   TRIG 93.0 07/08/2009   Lab Results  Component Value Date   CHOLHDL 2 05/12/2013   CHOLHDL 4 10/20/2011   CHOLHDL 4 07/08/2009   No results found for this basename: LDLDIRECT         Studies: Ct Abdomen Pelvis Wo Contrast  06/12/2013   CLINICAL DATA:  Bloating  EXAM: CT ABDOMEN AND PELVIS WITHOUT CONTRAST  TECHNIQUE: Multidetector CT imaging of the abdomen and pelvis was performed following the standard protocol without IV contrast.  COMPARISON:  None.  FINDINGS: Moderate ascites within the abdomen.  There is stranding associated with the pancreatic head. Fat planes in the pancreatic at are obscured. Findings are worrisome for pancreatitis. There is a loculated fluid collection in the region of the uncinate process of the pancreas measuring 3.6 x 3.2 cm. There are no air bubbles within the fluid collection. Fluid extends from this area adjacent to the IVC on image 33. There is a large amount of free fluid in the lesser sac. The omentum is somewhat abnormal. See image 31. Within the omental fat, there are areas of either soft tissue or fluid density. Omental caking and peritoneal tumor would be difficult to exclude.  Foley catheter  decompresses the bladder. The prostate is prominent. It measures 7.2 cm in diameter.  Periportal edema in the liver is suspected. Nonspecific 1.1 x 2.1 cm hypodensity in the right lobe of the liver on image 32.  Spleen is within normal limits.  Adrenal glands are unremarkable.  Kidneys are atrophic. Simple cyst in the upper pole of the left kidney.  Atherosclerotic vascular calcifications are noted. Small hiatal hernia is present.  Small right pleural effusion.  Degenerative changes in the lumbar spine. No vertebral compression deformity. Sclerotic focus in the left iliac bone.  IMPRESSION: There are inflammatory changes in the pancreas. Correlate with amylase and lipase.  3.6 cm fluid collection adjacent to the uncinate process of the pancreas. This may represent a pseudocyst, however follow-up studies to ensure resolution are recommended.  Large amount of free fluid in the  abdomen including the lesser sac. The omentum may be abnormal. Omental spread of tumor cannot be excluded.  Prostate gland is prominent.   Electronically Signed   By: Maryclare Bean M.D.   On: 06/12/2013 20:52   US Paracentesis  06/13/2013   CLINICAL DATA:  History of complex pancreatic cystic lesion, CHF, ascites. Request is made for diagnostic paracentesis.  EXAM: ULTRASOUND GUIDED DIAGNOSTIC PARACENTESIS  COMPARISON:  None.  PROCEDURE: An ultrasound guided paracentesis was thoroughly discussed with the patient and questions answered. The benefits, risks, alternatives and complications were also discussed. The patient understands and wishes to proceed with the procedure. Written consent was obtained.  Ultrasound was performed to localize and mark an adequate pocket of fluid in the right lower quadrant of the abdomen. The area was then prepped and draped in the normal sterile fashion. 1% Lidocaine was used for local anesthesia. Under ultrasound guidance a 19 gauge Yueh catheter was introduced. Paracentesis was performed. The catheter was removed  and a dressing applied.  Complications: None.  FINDINGS: A total of approximately 200 cc's of turbid,amber fluid was removed. The fluid sample was sent for laboratory analysis.  IMPRESSION: Successful ultrasound guided diagnostic paracentesis yielding 200 cc's of ascites. Dermabond was applied over the puncture site post procedure to prevent future leakage of ascites.  Read by: Rowe Robert ,P.A.-C.   Electronically Signed   By: Markus Daft M.D.   On: 06/13/2013 17:17   Dg Abd Acute W/chest  06/12/2013   CLINICAL DATA:  Low pain and distention.  Nausea.  EXAM: ACUTE ABDOMEN SERIES (ABDOMEN 2 VIEW & CHEST 1 VIEW)  COMPARISON:  None.  FINDINGS: There is cardiomegaly. Prior CABG. Pulmonary vascularity is normal. Slight blunting of the right costophrenic angle may represent a small pleural effusion.  No free air in the abdomen. There is air in nondistended large and small bowel loops. There is moderate air in the stomach. Overall density in the abdomen suggest the possibility of ascites but this is not definitive.  There are degenerative changes in the spine but there is no acute osseous abnormality.  IMPRESSION: 1. Probable small right pleural effusion. 2. Possible ascites.   Electronically Signed   By: Rozetta Nunnery M.D.   On: 06/12/2013 18:56    ASSESSMENT AND PLAN:  1. Ascites/heart failure: I suspect his ascites is secondary to right heart failure, given his previously documented moderate to severe RV enlargement with moderately reduced systolic function, and moderate to severe tricuspid regurgitation with PASP 46 mmHg. He has JVD to the angle of the jaw. I would recommend repeating an echocardiogram to evaluate both right and left-sided function, and to make certain LV systolic function is still preserved. Diastolic functional assessment will not be accurate in the setting of atrial fibrillation. He previously had mild mitral regurgitation but by exam, this appears to have progressed. Hepatic function panel  is normal thus he probably does not have congestive hepatomegaly. I also recommend increasing IV Lasix to 60 mg tid with close monitoring of renal function. He should be evaluated by urology at some point given his prostate enlargement on CT, to make sure that there is no obvious impediment to diuresis. As an outpatient, he had been taking Lasix 80 mg bid, and he may end up requiring a higher dose or a switch to torsemide with periodic metolazone. This can be decided after seeing his diuretic response and review of the echocardiogram. He may require a therapeutic paracentesis if IV diuretics do not provide an  adequate response. I would recommend an outpatient stress test to evaluate for an ischemic etiology as well, given his history of CAD and CABG 20 years ago. I do not see that he has a history of COPD. Given his atrial fibrillation and right heart failure, he should undergo a formal evaluation for sleep apnea as an outpatient. I also recommend a dietary consultation so that he can be further educated on an appropriate diet for heart failure.  2. CAD/CABG: Continue statin, Coreg, and ACEI. Repeat stress test as outpatient to evaluate for an ischemic etiology for heart failure.  3. Atrial fibrillation: Rate is controlled. Warfarin can be restarted, unless he requires a therapeutic paracentesis prior to discharge.  He should undergo a formal evaluation for sleep apnea as an outpatient.  4. Hypertension: Controlled on enalapril and Coreg.  5. Pancreatic fluid collection: Management as per GI and hospitalist service. Unable to get MRI.  Signed: Kate Sable, M.D., F.A.C.C.  06/14/2013, 3:08 PM

## 2013-06-14 NOTE — Progress Notes (Addendum)
[facility] PROGRESS NOTE  Hawkin Zumalt [id number] DOB: 12-Jan-1930 DOA: 06/12/2013 PCP: Garnet Koyanagi, DO Interim Summary: Gary CAMBRAY Sr. is an 80 y.o. male with hx of CAD, ischemic cardiomyopathy, s/p CCY, HTN, CHF, hyperlipidemia, neurogenic bladder, afib on chronic anticoagualation with coumadin, presents to [facility] because of abdominal distention. He actually was rather asymptomatic. Family noted abdominal distention and call his PCP, who recommended that he can increase his Lasix. Family decided instead to go to the urgent care at [facility]. Work up there included an abdominal CT, which showed a 3.6cm complex cystic structure at the pancreatic ucinate. He has possible ascites, and there is evidence of pancreatitis. His lipase was normal. His WBC was normal, Hb of 10.9 g per dL, Cr was elevated to 1.8. His LFTs were normal. He did have a colonoscopy before, but it was in 2007. He had normal BM, and has no weight loss. Hospitalsit was asked to admit him to expedite the work up of his abdominal distension. On arrival to the hospital an MRI of the abdomen was ordered, but could not be done as he could not fit in it. GI consulted for recommendations. He underwent a paracentesis on 4/17, ascitic fluid consistent with cardiac in origin. Dr Benson Norway recommended cardiology consultation. Meanwhile we requested for a n echocardiogram and he is on lasix BID. He reported to me that at home he is taking only 40 mg daily of lasix instead of 80 mg daily  For the last 2 years.   Assessment/Plan: 1. Pancreatitis and pancreatic mass with abdominal ascites:  - admitted to the med surg.  - MRI abdomen ordered. GI consulted , recommended paracentesis for diagnostic purposes.  - he is currently asymptomatic without any abdominal pain, nausea or vomiting.  - advanced his diet to low sodium diet.   2. Hypertension - controlled.   3. UTI:  - CONTINUE with rocephin.   4. H/o systolic heart failure: - resume  lasix , coreg. he appears to be compensated.  - echocardiogram ordered.  - intake and output and daily weights.   DVT prophylaxis.   Code Status: full code Family Communication: discussed with the family at bedside Disposition Plan: pending further investigation.    Consultants:  Gastroenterology  cardiolgoy  Procedures:  MRI abdomen  Paracentesis for ascites   Antibiotics:  Rocephin 4/16  HPI/Subjective: Comfortable.   Objective: Filed Vitals:   06/14/13 0747  BP: 135/73  Pulse: 75  Temp:   Resp:     Intake/Output Summary (Last 24 hours) at 06/14/13 1516 Last data filed at 06/14/13 0945  Gross per 24 hour  Intake    350 ml  Output    600 ml  Net   -250 ml   Filed Weights   06/12/13 1717 06/13/13 0019  Weight: 99.791 kg (220 lb) 103.148 kg (227 lb 6.4 oz)    Exam:   General:  Alert afebrile comfortable  Cardiovascular: s1s2  Respiratory: ctab   Abdomen: distended, soft bs+, non tender  Musculoskeletal: 3+ pedal edema. Erythematous rash over the lower extremities.   Data Reviewed: Basic Metabolic Panel:  Recent Labs Lab 06/12/13 1730 06/13/13 0639 06/14/13 0557  NA 137  --  138  K 5.3  --  4.7  CL 97  --  101  CO2 30  --  28  GLUCOSE 126*  --  86  BUN 32*  --  29*  CREATININE 1.80* 1.56* 1.56*  CALCIUM 9.8  --  9.6  Liver Function Tests:  Recent Labs Lab 06/12/13 1730 06/13/13 1828  AST 19 20  ALT 7 9  ALKPHOS 102 98  BILITOT 0.7 0.8  PROT 6.9 6.7  ALBUMIN 3.7 3.5    Recent Labs Lab 06/12/13 2119  LIPASE 46   No results found for this basename: AMMONIA,  in the last 168 hours CBC:  Recent Labs Lab 06/12/13 1730 06/13/13 0639 06/14/13 0557  WBC 5.2 5.1 3.7*  NEUTROABS 3.7  --   --   HGB 10.9* 10.2* 9.7*  HCT 34.8* 33.0* 31.8*  MCV 82.5 81.9 80.9  PLT 216 209 174   Cardiac Enzymes: No results found for this basename: CKTOTAL, CKMB, CKMBINDEX, TROPONINI,  in the last 168 hours BNP (last 3  results)  Recent Labs  06/14/13 0945  PROBNP 3928.0*   CBG: No results found for this basename: GLUCAP,  in the last 168 hours  No results found for this or any previous visit (from the past 240 hour(s)).   Studies: Ct Abdomen Pelvis Wo Contrast  06/12/2013   CLINICAL DATA:  Bloating  EXAM: CT ABDOMEN AND PELVIS WITHOUT CONTRAST  TECHNIQUE: Multidetector CT imaging of the abdomen and pelvis was performed following the standard protocol without IV contrast.  COMPARISON:  None.  FINDINGS: Moderate ascites within the abdomen.  There is stranding associated with the pancreatic head. Fat planes in the pancreatic at are obscured. Findings are worrisome for pancreatitis. There is a loculated fluid collection in the region of the uncinate process of the pancreas measuring 3.6 x 3.2 cm. There are no air bubbles within the fluid collection. Fluid extends from this area adjacent to the IVC on image 33. There is a large amount of free fluid in the lesser sac. The omentum is somewhat abnormal. See image 31. Within the omental fat, there are areas of either soft tissue or fluid density. Omental caking and peritoneal tumor would be difficult to exclude.  Foley catheter decompresses the bladder. The prostate is prominent. It measures 7.2 cm in diameter.  Periportal edema in the liver is suspected. Nonspecific 1.1 x 2.1 cm hypodensity in the right lobe of the liver on image 32.  Spleen is within normal limits.  Adrenal glands are unremarkable.  Kidneys are atrophic. Simple cyst in the upper pole of the left kidney.  Atherosclerotic vascular calcifications are noted. Small hiatal hernia is present.  Small right pleural effusion.  Degenerative changes in the lumbar spine. No vertebral compression deformity. Sclerotic focus in the left iliac bone.  IMPRESSION: There are inflammatory changes in the pancreas. Correlate with amylase and lipase.  3.6 cm fluid collection adjacent to the uncinate process of the pancreas. This  may represent a pseudocyst, however follow-up studies to ensure resolution are recommended.  Large amount of free fluid in the abdomen including the lesser sac. The omentum may be abnormal. Omental spread of tumor cannot be excluded.  Prostate gland is prominent.   Electronically Signed   By: Maryclare Bean M.D.   On: 06/12/2013 20:52   US Paracentesis  06/13/2013   CLINICAL DATA:  History of complex pancreatic cystic lesion, CHF, ascites. Request is made for diagnostic paracentesis.  EXAM: ULTRASOUND GUIDED DIAGNOSTIC PARACENTESIS  COMPARISON:  None.  PROCEDURE: An ultrasound guided paracentesis was thoroughly discussed with the patient and questions answered. The benefits, risks, alternatives and complications were also discussed. The patient understands and wishes to proceed with the procedure. Written consent was obtained.  Ultrasound was performed to localize and mark  an adequate pocket of fluid in the right lower quadrant of the abdomen. The area was then prepped and draped in the normal sterile fashion. 1% Lidocaine was used for local anesthesia. Under ultrasound guidance a 19 gauge Yueh catheter was introduced. Paracentesis was performed. The catheter was removed and a dressing applied.  Complications: None.  FINDINGS: A total of approximately 200 cc's of turbid,amber fluid was removed. The fluid sample was sent for laboratory analysis.  IMPRESSION: Successful ultrasound guided diagnostic paracentesis yielding 200 cc's of ascites. Dermabond was applied over the puncture site post procedure to prevent future leakage of ascites.  Read by: Rowe Robert ,P.A.-C.   Electronically Signed   By: Markus Daft M.D.   On: 06/13/2013 17:17   Dg Abd Acute W/chest  06/12/2013   CLINICAL DATA:  Low pain and distention.  Nausea.  EXAM: ACUTE ABDOMEN SERIES (ABDOMEN 2 VIEW & CHEST 1 VIEW)  COMPARISON:  None.  FINDINGS: There is cardiomegaly. Prior CABG. Pulmonary vascularity is normal. Slight blunting of the right  costophrenic angle may represent a small pleural effusion.  No free air in the abdomen. There is air in nondistended large and small bowel loops. There is moderate air in the stomach. Overall density in the abdomen suggest the possibility of ascites but this is not definitive.  There are degenerative changes in the spine but there is no acute osseous abnormality.  IMPRESSION: 1. Probable small right pleural effusion. 2. Possible ascites.   Electronically Signed   By: Rozetta Nunnery M.D.   On: 06/12/2013 18:56    Scheduled Meds: . atorvastatin  20 mg Oral Daily  . carvedilol  12.5 mg Oral BID WC  . cefTRIAXone (ROCEPHIN)  IV  1 g Intravenous Q24H  . cholecalciferol  400 Units Oral Daily  . enalapril  20 mg Oral Daily  . ferrous sulfate  325 mg Oral Q breakfast  . folic acid  1 mg Oral Daily  . furosemide  40 mg Intravenous Q12H  . heparin  5,000 Units Subcutaneous 3 times per day  . pantoprazole  40 mg Oral Daily  . vitamin B-6  500 mg Oral Daily  . URELLE  1 tablet Oral TID AC & HS   Continuous Infusions:   Principal Problem:   Pancreatitis Active Problems:   HYPERLIPIDEMIA   HYPERTENSION   CARDIOMYOPATHY, ISCHEMIC   LBBB   FIBRILLATION, ATRIAL   GERD   Recurrent UTI   Abdominal mass    Time spent: 62min    Arnaldo Heffron  [facility] Pager [phone]. If 7PM-7AM, please contact night-coverage at [email or web address], password [facility] 06/14/2013, 3:16 PM  LOS: 2 days

## 2013-06-14 NOTE — Progress Notes (Addendum)
ANTICOAGULATION CONSULT NOTE - Initial Consult  Pharmacy Consult for Coumadin Indication: atrial fibrillation  Allergies  Allergen Reactions  . Amoxicillin     REACTION: ha/nausea  . Diltiazem Hcl     REACTION: low bp  . Hydrocodone-Acetaminophen     REACTION: ha  . Niacin     REACTION: ha/nausea  . Niacin-Lovastatin Er     REACTION: ha/nausea  . Paroxetine     REACTION: ha/disoriented  . Propoxyphene N-Acetaminophen     REACTION: headache  . Pseudoephedrine     REACTION: stopped urine flow  . Sulfonamide Derivatives     REACTION: ha/nausea  . Tolterodine Tartrate     REACTION: bladder stopped    Patient Measurements: Height: 6\' 2"  (188 cm) Weight: 227 lb 6.4 oz (103.148 kg) IBW/kg (Calculated) : 82.2 Heparin Dosing Weight:   Vital Signs: Temp: 98.1 F (36.7 C) (04/18 1500) Temp src: Oral (04/18 1500) BP: 131/83 mmHg (04/18 1500) Pulse Rate: 84 (04/18 1500)  Labs:  Recent Labs  06/12/13 1730 06/13/13 0639 06/13/13 1828 06/14/13 0557  HGB 10.9* 10.2*  --  9.7*  HCT 34.8* 33.0*  --  31.8*  PLT 216 209  --  174  LABPROT  --   --  25.2*  --   INR  --   --  2.38*  --   CREATININE 1.80* 1.56*  --  1.56*    Estimated Creatinine Clearance: 46.8 ml/min (by C-G formula based on Cr of 1.56).   Medical History: Past Medical History  Diagnosis Date  . Neurogenic bladder     has had bladder catheter changes at the urology office every 12 weeks  . Anemia   . Hyperlipidemia   . Hypertension   . Heart attack     1983  . Chronic systolic heart failure     a. prior EF 20-30%;  b. Echo 7/09: EF 35-40%, basal inferoposterior HK, mild AI, mild to moderate MR, moderate to marked LAE, mild to moderate TR, mildly increased PASP, moderate RAE;   c. Echo 01/2012:   mild LVH, EF 50%, Tr AI, mild MR, severe LAE, mod to severe RAE, mod to severe RVE, mod reduced RVSF, mod to severe TR, PASP 46  . Arrhythmia     afib  . Warfarin anticoagulation   . GERD (gastroesophageal  reflux disease)   . Anxiety   . Renal failure     Medications:  Prescriptions prior to admission  Medication Sig Dispense Refill  . acetaminophen (TYLENOL) 500 MG tablet Take 1,000 mg by mouth 3 (three) times daily.      Marland Kitchen atorvastatin (LIPITOR) 20 MG tablet Take 1 tablet (20 mg total) by mouth daily.  90 tablet  3  . carvedilol (COREG) 12.5 MG tablet Take 12.5 mg by mouth 2 (two) times daily with a meal.      . clonazePAM (KLONOPIN) 1 MG tablet Take 1 mg by mouth at bedtime.      . enalapril (VASOTEC) 20 MG tablet Take 20 mg by mouth 2 (two) times daily.      . ferrous sulfate 325 (65 FE) MG tablet Take 325 mg by mouth daily with breakfast.       . folic acid (FOLVITE) 1 MG tablet Take 1 mg by mouth daily.        . furosemide (LASIX) 40 MG tablet Take 80 mg by mouth daily.      . Glucos-Chondroit-Hyaluron-MSM (GLUCOSAMINE CHONDROITIN JOINT PO) Take 1 tablet by mouth 2 (two)  times daily.      Marland Kitchen lidocaine (XYLOCAINE) 2 % jelly Place 1 application into the urethra daily as needed (pain).      . Meth-Hyo-M Bl-Na Phos-Ph Sal (URIBEL) 118 MG CAPS Take 1-4 capsules by mouth daily.       . Multiple Vitamin (MULTIVITAMIN) capsule Take 1 capsule by mouth daily.        . ondansetron (ZOFRAN) 8 MG tablet Take 8 mg by mouth every 12 (twelve) hours as needed for nausea or vomiting.      . triamcinolone cream (KENALOG) 0.1 % Apply 1 application topically 2 (two) times daily as needed (rash).      Marland Kitchen VITAMIN D, CHOLECALCIFEROL, PO Take 1 tablet by mouth daily.      Marland Kitchen warfarin (COUMADIN) 2.5 MG tablet Take 2.5 mg by mouth daily.        Assessment: 84yom on Coumadin PTA for Afib. Coumadin has been held (missed 2 doses) for abdominal and cardiac workup - now to restart per [facility]. INR was 2.38 on 4/17 PM.  - Hg 9.7, Plts wnl - No significant bleeding reported - PTA regimen: 2.5mg  daily (confirmed with patient)  Goal of Therapy:  INR 2-3   Plan:  1. Coumadin 3mg  po x 1 today 2. Daily INR 3. Will  discontinue SQ heparin with INR therapeutic  Patsey Berthold [facility] [id number] 06/14/2013,5:32 PM

## 2013-06-15 DIAGNOSIS — I369 Nonrheumatic tricuspid valve disorder, unspecified: Secondary | ICD-10-CM

## 2013-06-15 LAB — BASIC METABOLIC PANEL
BUN: 27 mg/dL — ABNORMAL HIGH (ref 6–23)
CO2: 27 mEq/L (ref 19–32)
Calcium: 9.8 mg/dL (ref 8.4–10.5)
Chloride: 97 mEq/L (ref 96–112)
Creatinine, Ser: 1.47 mg/dL — ABNORMAL HIGH (ref 0.50–1.35)
GFR, EST AFRICAN AMERICAN: 49 mL/min — AB (ref >90)
GFR, EST NON AFRICAN AMERICAN: 43 mL/min — AB (ref >90)
Glucose, Bld: 107 mg/dL — ABNORMAL HIGH (ref 70–99)
Potassium: 3.9 mEq/L (ref 3.7–5.3)
SODIUM: 136 meq/L — AB (ref 137–147)

## 2013-06-15 LAB — MAGNESIUM: MAGNESIUM: 1.9 mg/dL (ref 1.5–2.5)

## 2013-06-15 LAB — PROTIME-INR
INR: 2.05 — ABNORMAL HIGH (ref 0.00–1.49)
PROTHROMBIN TIME: 22.5 s — AB (ref 11.6–15.2)

## 2013-06-15 MED ORDER — WARFARIN SODIUM 5 MG PO TABS
5.0000 mg | ORAL_TABLET | Freq: Once | ORAL | Status: AC
  Administered 2013-06-15: 5 mg via ORAL
  Filled 2013-06-15: qty 1

## 2013-06-15 MED ORDER — FUROSEMIDE 80 MG PO TABS
120.0000 mg | ORAL_TABLET | Freq: Two times a day (BID) | ORAL | Status: DC
  Administered 2013-06-15 – 2013-06-16 (×2): 120 mg via ORAL
  Filled 2013-06-15 (×4): qty 1

## 2013-06-15 NOTE — Progress Notes (Signed)
[facility] PROGRESS NOTE  Gary [name] [id number] DOB: 22-Dec-1929 DOA: 06/12/2013 PCP: Garnet Koyanagi, DO Interim Summary: Gary BALICKI Sr. is an 80 y.o. male with hx of CAD, ischemic cardiomyopathy, s/p CCY, HTN, CHF, hyperlipidemia, neurogenic bladder, afib on chronic anticoagualation with coumadin, presents to [facility] because of abdominal distention. He actually was rather asymptomatic. Family noted abdominal distention and call his PCP, who recommended that he can increase his Lasix. Family decided instead to go to the urgent care at [facility]. Work up there included an abdominal CT, which showed a 3.6cm complex cystic structure at the pancreatic ucinate. He has possible ascites, and there is evidence of pancreatitis. His lipase was normal. His WBC was normal, Hb of 10.9 g per dL, Cr was elevated to 1.8. His LFTs were normal. He did have a colonoscopy before, but it was in 2007. He had normal BM, and has no weight loss. Hospitalsit was asked to admit him to expedite the work up of his abdominal distension. On arrival to the hospital an MRI of the abdomen was ordered, but could not be done as he could not fit in it. GI consulted for recommendations. He underwent a paracentesis on 4/17, ascitic fluid consistent with cardiac in origin. Dr Benson Norway recommended cardiology consultation. Meanwhile we requested for a n echocardiogram and he is on lasix BID. He reported to me that at home he is taking only 40 mg daily of lasix instead of 80 mg daily  For the last 2 years.   Assessment/Plan: 1. Pancreatitis and pancreatic mass with abdominal ascites:  - admitted to the med surg.  - MRI abdomen ordered. GI consulted , recommended paracentesis for diagnostic purposes.  - he is currently asymptomatic without any abdominal pain, nausea or vomiting.  - advanced his diet to low sodium diet.   2. Hypertension - controlled.   3. UTI:  - CONTINUE with rocephin.   4. H/o systolic heart failure: - resume  lasix , coreg. he appears to be compensated.  - echocardiogram ordered.  - intake and output and daily weights.   DVT prophylaxis.   Code Status: full code Family Communication: discussed with the family at bedside Disposition Plan: pending further investigation.    Consultants:  Gastroenterology  cardiolgoy  Procedures:  MRI abdomen  Paracentesis for ascites   Antibiotics:  Rocephin 4/16  HPI/Subjective: Comfortable.   Objective: Filed Vitals:   06/15/13 1451  BP: 120/70  Pulse: 82  Temp: 98.5 F (36.9 C)  Resp: 18    Intake/Output Summary (Last 24 hours) at 06/15/13 1456 Last data filed at 06/15/13 0502  Gross per 24 hour  Intake      0 ml  Output   4250 ml  Net  -4250 ml   Filed Weights   06/12/13 1717 06/13/13 0019  Weight: 99.791 kg (220 lb) 103.148 kg (227 lb 6.4 oz)    Exam:   General:  Alert afebrile comfortable  Cardiovascular: s1s2  Respiratory: ctab   Abdomen: distended, soft bs+, non tender  Musculoskeletal: 3+ pedal edema. Erythematous rash over the lower extremities.   Data Reviewed: Basic Metabolic Panel:  Recent Labs Lab 06/12/13 1730 06/13/13 0639 06/14/13 0557 06/15/13 0650  NA 137  --  138 136*  K 5.3  --  4.7 3.9  CL 97  --  101 97  CO2 30  --  28 27  GLUCOSE 126*  --  86 107*  BUN 32*  --  29* 27*  CREATININE 1.80*  1.56* 1.56* 1.47*  CALCIUM 9.8  --  9.6 9.8  MG  --   --   --  1.9   Liver Function Tests:  Recent Labs Lab 06/12/13 1730 06/13/13 1828  AST 19 20  ALT 7 9  ALKPHOS 102 98  BILITOT 0.7 0.8  PROT 6.9 6.7  ALBUMIN 3.7 3.5    Recent Labs Lab 06/12/13 2119  LIPASE 46   No results found for this basename: AMMONIA,  in the last 168 hours CBC:  Recent Labs Lab 06/12/13 1730 06/13/13 0639 06/14/13 0557  WBC 5.2 5.1 3.7*  NEUTROABS 3.7  --   --   HGB 10.9* 10.2* 9.7*  HCT 34.8* 33.0* 31.8*  MCV 82.5 81.9 80.9  PLT 216 209 174   Cardiac Enzymes: No results found for this  basename: CKTOTAL, CKMB, CKMBINDEX, TROPONINI,  in the last 168 hours BNP (last 3 results)  Recent Labs  06/14/13 0945  PROBNP 3928.0*   CBG: No results found for this basename: GLUCAP,  in the last 168 hours  No results found for this or any previous visit (from the past 240 hour(s)).   Studies: US Paracentesis  06/13/2013   CLINICAL DATA:  History of complex pancreatic cystic lesion, CHF, ascites. Request is made for diagnostic paracentesis.  EXAM: ULTRASOUND GUIDED DIAGNOSTIC PARACENTESIS  COMPARISON:  None.  PROCEDURE: An ultrasound guided paracentesis was thoroughly discussed with the patient and questions answered. The benefits, risks, alternatives and complications were also discussed. The patient understands and wishes to proceed with the procedure. Written consent was obtained.  Ultrasound was performed to localize and mark an adequate pocket of fluid in the right lower quadrant of the abdomen. The area was then prepped and draped in the normal sterile fashion. 1% Lidocaine was used for local anesthesia. Under ultrasound guidance a 19 gauge Yueh catheter was introduced. Paracentesis was performed. The catheter was removed and a dressing applied.  Complications: None.  FINDINGS: A total of approximately 200 cc's of turbid,amber fluid was removed. The fluid sample was sent for laboratory analysis.  IMPRESSION: Successful ultrasound guided diagnostic paracentesis yielding 200 cc's of ascites. Dermabond was applied over the puncture site post procedure to prevent future leakage of ascites.  Read by: Rowe Robert ,P.A.-C.   Electronically Signed   By: Markus Daft M.D.   On: 06/13/2013 17:17    Scheduled Meds: . atorvastatin  20 mg Oral Daily  . carvedilol  12.5 mg Oral BID WC  . cefTRIAXone (ROCEPHIN)  IV  1 g Intravenous Q24H  . cholecalciferol  400 Units Oral Daily  . enalapril  20 mg Oral Daily  . ferrous sulfate  325 mg Oral Q breakfast  . folic acid  1 mg Oral Daily  . furosemide   120 mg Oral BID  . pantoprazole  40 mg Oral Daily  . vitamin B-6  500 mg Oral Daily  . URELLE  1 tablet Oral TID AC & HS  . warfarin  5 mg Oral ONCE-1800  . Warfarin - Pharmacist Dosing Inpatient   Does not apply q1800   Continuous Infusions:   Principal Problem:   Pancreatitis Active Problems:   HYPERLIPIDEMIA   HYPERTENSION   CARDIOMYOPATHY, ISCHEMIC   LBBB   FIBRILLATION, ATRIAL   GERD   Recurrent UTI   Abdominal mass    Time spent: 37min    Mozetta Murfin  [facility] Pager [phone]. If 7PM-7AM, please contact night-coverage at [email or web address], password [facility] 06/15/2013, 2:56 PM  LOS:  3 days

## 2013-06-15 NOTE — Progress Notes (Signed)
ANTICOAGULATION CONSULT NOTE - Follow Up Consult  Pharmacy Consult for Coumadin Indication: atrial fibrillation  Allergies  Allergen Reactions  . Amoxicillin     REACTION: ha/nausea  . Diltiazem Hcl     REACTION: low bp  . Hydrocodone-Acetaminophen     REACTION: ha  . Niacin     REACTION: ha/nausea  . Niacin-Lovastatin Er     REACTION: ha/nausea  . Paroxetine     REACTION: ha/disoriented  . Propoxyphene N-Acetaminophen     REACTION: headache  . Pseudoephedrine     REACTION: stopped urine flow  . Sulfonamide Derivatives     REACTION: ha/nausea  . Tolterodine Tartrate     REACTION: bladder stopped    Patient Measurements: Height: 6\' 2"  (188 cm) Weight: 227 lb 6.4 oz (103.148 kg) IBW/kg (Calculated) : 82.2 Heparin Dosing Weight:    Vital Signs: Temp: 98.4 F (36.9 C) (04/19 0459) Temp src: Oral (04/19 0459) BP: 115/75 mmHg (04/19 1048) Pulse Rate: 88 (04/19 0459)  Labs:  Recent Labs  06/12/13 1730 06/13/13 0639 06/13/13 1828 06/14/13 0557 06/15/13 0650  HGB 10.9* 10.2*  --  9.7*  --   HCT 34.8* 33.0*  --  31.8*  --   PLT 216 209  --  174  --   LABPROT  --   --  25.2*  --  22.5*  INR  --   --  2.38*  --  2.05*  CREATININE 1.80* 1.56*  --  1.56* 1.47*    Estimated Creatinine Clearance: 49.6 ml/min (by C-G formula based on Cr of 1.47).   Admit Complaint: abd distention, pedal edema  Anticoagulation: Warf-Rx: Afib; INR 2.05, Hg 9.7. Expect INR to continue to decrease (missed 2 days, 4/16 & 4/17). - PTA regimen: 2.5mg  daily with admit INR 2.38  Infectious Disease: CTX for UTI - no cxs; no evidence of SBP per GI. WBC 3.7 and pt afebrile. No Ucx done.  Cardiovascular: hx HL, HTN, MI, HF, Afib; per GI, this is Cardiac ascites - Cards consulted: ascites 2nd HF - on Lipitor, Coreg, enalapril, lasix iv  Endocrinology:  GI: MRI - Pancreatic cystic lesion. Distended with ascites (from CHF). GI will f/u as outpatient. - paracentesis on  4/17  Neurology:  Nephrology: Scr 1.47 with CrCl 49  Pulmonary:  Hematology / Oncology:Hgb low 9.7  PTA Medication Issues:  Best Practices: po PPI, Coumadin   Goal of Therapy:  INR 2-3 Monitor platelets by anticoagulation protocol: Yes   Plan:  Diuresis Coumadin 5mg  po x 1 tonight  Collin Rengel S. Alford Highland, PharmD, [facility] Clinical Staff Pharmacist Pager [phone]  [facility] Alford Highland 06/15/2013,11:26 AM

## 2013-06-15 NOTE — Progress Notes (Signed)
  Echocardiogram 2D Echocardiogram has been performed.  [place] 06/15/2013, 3:54 PM

## 2013-06-15 NOTE — Progress Notes (Signed)
Subjective: No acute events.    Objective: Vital signs in last 24 hours: Temp:  [98.1 F (36.7 C)-98.4 F (36.9 C)] 98.4 F (36.9 C) (04/19 0459) Pulse Rate:  [84-88] 88 (04/19 0459) Resp:  [18-20] 18 (04/19 0459) BP: (112-131)/(72-83) 112/73 mmHg (04/19 0459) SpO2:  [93 %-99 %] 93 % (04/19 0459) Last BM Date: 06/13/13  Intake/Output from previous day: 04/18 0701 - 04/19 0700 In: 300 [P.O.:300] Out: 4650 [Urine:4650] Intake/Output this shift:    General appearance: alert and no distress GI: distended with ascites.  Lab Results:  Recent Labs  06/12/13 1730 06/13/13 0639 06/14/13 0557  WBC 5.2 5.1 3.7*  HGB 10.9* 10.2* 9.7*  HCT 34.8* 33.0* 31.8*  PLT 216 209 174   BMET  Recent Labs  06/12/13 1730 06/13/13 0639 06/14/13 0557 06/15/13 0650  NA 137  --  138 136*  K 5.3  --  4.7 3.9  CL 97  --  101 97  CO2 30  --  28 27  GLUCOSE 126*  --  86 107*  BUN 32*  --  29* 27*  CREATININE 1.80* 1.56* 1.56* 1.47*  CALCIUM 9.8  --  9.6 9.8   LFT  Recent Labs  06/13/13 1828  PROT 6.7  ALBUMIN 3.5  AST 20  ALT 9  ALKPHOS 98  BILITOT 0.8  BILIDIR 0.3  IBILI 0.5   PT/INR  Recent Labs  06/13/13 1828 06/15/13 0650  LABPROT 25.2* 22.5*  INR 2.38* 2.05*   Hepatitis Panel No results found for this basename: HEPBSAG, HCVAB, HEPAIGM, HEPBIGM,  in the last 72 hours C-Diff No results found for this basename: CDIFFTOX,  in the last 72 hours Fecal Lactopherrin No results found for this basename: FECLLACTOFRN,  in the last 72 hours  Studies/Results: US Paracentesis  06/13/2013   CLINICAL DATA:  History of complex pancreatic cystic lesion, CHF, ascites. Request is made for diagnostic paracentesis.  EXAM: ULTRASOUND GUIDED DIAGNOSTIC PARACENTESIS  COMPARISON:  None.  PROCEDURE: An ultrasound guided paracentesis was thoroughly discussed with the patient and questions answered. The benefits, risks, alternatives and complications were also discussed. The patient  understands and wishes to proceed with the procedure. Written consent was obtained.  Ultrasound was performed to localize and mark an adequate pocket of fluid in the right lower quadrant of the abdomen. The area was then prepped and draped in the normal sterile fashion. 1% Lidocaine was used for local anesthesia. Under ultrasound guidance a 19 gauge Yueh catheter was introduced. Paracentesis was performed. The catheter was removed and a dressing applied.  Complications: None.  FINDINGS: A total of approximately 200 cc's of turbid,amber fluid was removed. The fluid sample was sent for laboratory analysis.  IMPRESSION: Successful ultrasound guided diagnostic paracentesis yielding 200 cc's of ascites. Dermabond was applied over the puncture site post procedure to prevent future leakage of ascites.  Read by: Rowe Robert ,P.A.-C.   Electronically Signed   By: Markus Daft M.D.   On: 06/13/2013 17:17    Medications:  Scheduled: . atorvastatin  20 mg Oral Daily  . carvedilol  12.5 mg Oral BID WC  . cefTRIAXone (ROCEPHIN)  IV  1 g Intravenous Q24H  . cholecalciferol  400 Units Oral Daily  . enalapril  20 mg Oral Daily  . ferrous sulfate  325 mg Oral Q breakfast  . folic acid  1 mg Oral Daily  . furosemide  60 mg Intravenous Q8H  . pantoprazole  40 mg Oral Daily  . vitamin B-6  500 mg Oral Daily  . URELLE  1 tablet Oral TID AC & HS  . Warfarin - Pharmacist Dosing Inpatient   Does not apply q1800   Continuous:   Assessment/Plan: 1) Ascites secondary to CHF. 2) Pancreatic cystic lesion.   I will further evaluate his cystic lesion once his ascites has resolved as an outpatient.  I will make arrangements for him to follow up with me.  Plan: 1) Continue with diuretics per Cardiology. 2) Follow up in the office in 4 weeks. 3) Signing off.   LOS: 3 days   Gary [name] 06/15/2013, 7:53 AM

## 2013-06-15 NOTE — Progress Notes (Signed)
Patient Name: Gary KOPACK Sr. Date of Encounter: 06/15/2013     Principal Problem:   Pancreatitis Active Problems:   HYPERLIPIDEMIA   HYPERTENSION   CARDIOMYOPATHY, ISCHEMIC   LBBB   FIBRILLATION, ATRIAL   GERD   Recurrent UTI   Abdominal mass    SUBJECTIVE  The patient feels better.  He is less dyspneic.  He has had a good response to IV Lasix and has diuresed 4 L.  He is not resting well in the hospital and is very anxious to go home.  His 2-D echo has not yet been done.  CURRENT MEDS . atorvastatin  20 mg Oral Daily  . carvedilol  12.5 mg Oral BID WC  . cefTRIAXone (ROCEPHIN)  IV  1 g Intravenous Q24H  . cholecalciferol  400 Units Oral Daily  . enalapril  20 mg Oral Daily  . ferrous sulfate  325 mg Oral Q breakfast  . folic acid  1 mg Oral Daily  . furosemide  60 mg Intravenous Q8H  . pantoprazole  40 mg Oral Daily  . vitamin B-6  500 mg Oral Daily  . URELLE  1 tablet Oral TID AC & HS  . warfarin  5 mg Oral ONCE-1800  . Warfarin - Pharmacist Dosing Inpatient   Does not apply q1800    OBJECTIVE  Filed Vitals:   06/14/13 1500 06/14/13 2210 06/15/13 0459 06/15/13 1048  BP: 131/83 126/72 112/73 115/75  Pulse: 84 86 88   Temp: 98.1 F (36.7 C) 98.3 F (36.8 C) 98.4 F (36.9 C)   TempSrc: Oral Oral Oral   Resp: 20 18 18    Height:      Weight:      SpO2: 99% 95% 93%     Intake/Output Summary (Last 24 hours) at 06/15/13 1211 Last data filed at 06/15/13 0502  Gross per 24 hour  Intake      0 ml  Output   4250 ml  Net  -4250 ml   Filed Weights   06/12/13 1717 06/13/13 0019  Weight: 220 lb (99.791 kg) 227 lb 6.4 oz (103.148 kg)    PHYSICAL EXAM  General: Pleasant, NAD.  Sleeping in the recliner because he does not do well in the bed. Neuro: Alert and oriented X 3. Moves all extremities spontaneously. Psych: Normal affect. HEENT:  Normal  Neck: Supple without bruits.  Jugular venous pressure mildly elevated. Lungs:  Resp regular and unlabored,  CTA. Heart: Irregularly irregular rhythm.  No S3 gallop.  Grade 2/6 apical holosystolic murmur.  Grade 2/6 systolic murmur at lower left sternal edge Abdomen: Soft, non-tender, non-distended, BS + x 4.  Ascites has improved. Extremities: 2+ pitting edema.  Accessory Clinical Findings  CBC  Recent Labs  06/12/13 1730 06/13/13 0639 06/14/13 0557  WBC 5.2 5.1 3.7*  NEUTROABS 3.7  --   --   HGB 10.9* 10.2* 9.7*  HCT 34.8* 33.0* 31.8*  MCV 82.5 81.9 80.9  PLT 216 209 [id number]   Basic Metabolic Panel  Recent Labs  06/14/13 0557 06/15/13 0650  NA 138 136*  K 4.7 3.9  CL 101 97  CO2 28 27  GLUCOSE 86 107*  BUN 29* 27*  CREATININE 1.56* 1.47*  CALCIUM 9.6 9.8  MG  --  1.9   Liver Function Tests  Recent Labs  06/12/13 1730 06/13/13 1828  AST 19 20  ALT 7 9  ALKPHOS 102 98  BILITOT 0.7 0.8  PROT 6.9 6.7  ALBUMIN 3.7 3.5  Recent Labs  06/12/13 2119  LIPASE 46   Cardiac Enzymes No results found for this basename: CKTOTAL, CKMB, CKMBINDEX, TROPONINI,  in the last 72 hours BNP No components found with this basename: POCBNP,  D-Dimer No results found for this basename: DDIMER,  in the last 72 hours Hemoglobin A1C No results found for this basename: HGBA1C,  in the last 72 hours Fasting Lipid Panel No results found for this basename: CHOL, HDL, LDLCALC, TRIG, CHOLHDL, LDLDIRECT,  in the last 72 hours Thyroid Function Tests No results found for this basename: TSH, T4TOTAL, FREET3, T3FREE, THYROIDAB,  in the last 72 hours  TELE    ECG  Pending  Radiology/Studies  Ct Abdomen Pelvis Wo Contrast  06/12/2013   CLINICAL DATA:  Bloating  EXAM: CT ABDOMEN AND PELVIS WITHOUT CONTRAST  TECHNIQUE: Multidetector CT imaging of the abdomen and pelvis was performed following the standard protocol without IV contrast.  COMPARISON:  None.  FINDINGS: Moderate ascites within the abdomen.  There is stranding associated with the pancreatic head. Fat planes in the pancreatic at  are obscured. Findings are worrisome for pancreatitis. There is a loculated fluid collection in the region of the uncinate process of the pancreas measuring 3.6 x 3.2 cm. There are no air bubbles within the fluid collection. Fluid extends from this area adjacent to the IVC on image 33. There is a large amount of free fluid in the lesser sac. The omentum is somewhat abnormal. See image 31. Within the omental fat, there are areas of either soft tissue or fluid density. Omental caking and peritoneal tumor would be difficult to exclude.  Foley catheter decompresses the bladder. The prostate is prominent. It measures 7.2 cm in diameter.  Periportal edema in the liver is suspected. Nonspecific 1.1 x 2.1 cm hypodensity in the right lobe of the liver on image 32.  Spleen is within normal limits.  Adrenal glands are unremarkable.  Kidneys are atrophic. Simple cyst in the upper pole of the left kidney.  Atherosclerotic vascular calcifications are noted. Small hiatal hernia is present.  Small right pleural effusion.  Degenerative changes in the lumbar spine. No vertebral compression deformity. Sclerotic focus in the left iliac bone.  IMPRESSION: There are inflammatory changes in the pancreas. Correlate with amylase and lipase.  3.6 cm fluid collection adjacent to the uncinate process of the pancreas. This may represent a pseudocyst, however follow-up studies to ensure resolution are recommended.  Large amount of free fluid in the abdomen including the lesser sac. The omentum may be abnormal. Omental spread of tumor cannot be excluded.  Prostate gland is prominent.   Electronically Signed   By: Maryclare Bean M.D.   On: 06/12/2013 20:52   US Paracentesis  06/13/2013   CLINICAL DATA:  History of complex pancreatic cystic lesion, CHF, ascites. Request is made for diagnostic paracentesis.  EXAM: ULTRASOUND GUIDED DIAGNOSTIC PARACENTESIS  COMPARISON:  None.  PROCEDURE: An ultrasound guided paracentesis was thoroughly discussed with  the patient and questions answered. The benefits, risks, alternatives and complications were also discussed. The patient understands and wishes to proceed with the procedure. Written consent was obtained.  Ultrasound was performed to localize and mark an adequate pocket of fluid in the right lower quadrant of the abdomen. The area was then prepped and draped in the normal sterile fashion. 1% Lidocaine was used for local anesthesia. Under ultrasound guidance a 19 gauge Yueh catheter was introduced. Paracentesis was performed. The catheter was removed and a dressing applied.  Complications:  None.  FINDINGS: A total of approximately 200 cc's of turbid,amber fluid was removed. The fluid sample was sent for laboratory analysis.  IMPRESSION: Successful ultrasound guided diagnostic paracentesis yielding 200 cc's of ascites. Dermabond was applied over the puncture site post procedure to prevent future leakage of ascites.  Read by: Rowe Robert ,P.A.-C.   Electronically Signed   By: Markus Daft M.D.   On: 06/13/2013 17:17   Dg Abd Acute W/chest  06/12/2013   CLINICAL DATA:  Low pain and distention.  Nausea.  EXAM: ACUTE ABDOMEN SERIES (ABDOMEN 2 VIEW & CHEST 1 VIEW)  COMPARISON:  None.  FINDINGS: There is cardiomegaly. Prior CABG. Pulmonary vascularity is normal. Slight blunting of the right costophrenic angle may represent a small pleural effusion.  No free air in the abdomen. There is air in nondistended large and small bowel loops. There is moderate air in the stomach. Overall density in the abdomen suggest the possibility of ascites but this is not definitive.  There are degenerative changes in the spine but there is no acute osseous abnormality.  IMPRESSION: 1. Probable small right pleural effusion. 2. Possible ascites.   Electronically Signed   By: Rozetta Nunnery M.D.   On: 06/12/2013 18:56    ASSESSMENT AND PLAN 1.  Right heart failure with ascites and history of moderate to severe tricuspid regurgitation with  pulmonary hypertension.  Repeat echo pending.  2. CAD/CABG: Continue statin, Coreg, and ACEI. Repeat stress test as outpatient to evaluate for an ischemic etiology for heart failure.  3. Atrial fibrillation: Rate is controlled. Warfarin can be restarted, unless he requires a therapeutic paracentesis prior to discharge. He should undergo a formal evaluation for sleep apnea as an outpatient.  4. Hypertension: Controlled on enalapril and Coreg.  5. Pancreatic fluid collection: Management as per GI and hospitalist service.   Plan: I have called the echo laboratory to be sure that his echo is done today. I will switch him to oral Lasix in a higher home dose of 120 mg twice a day. He will need close outpatient cardiology followup by Dr. Stanford Breed.  Signed, Darlin Coco MD

## 2013-06-16 ENCOUNTER — Other Ambulatory Visit: Payer: Self-pay | Admitting: Physician Assistant

## 2013-06-16 DIAGNOSIS — I509 Heart failure, unspecified: Secondary | ICD-10-CM

## 2013-06-16 DIAGNOSIS — K869 Disease of pancreas, unspecified: Secondary | ICD-10-CM

## 2013-06-16 DIAGNOSIS — I50810 Right heart failure, unspecified: Secondary | ICD-10-CM

## 2013-06-16 DIAGNOSIS — R1900 Intra-abdominal and pelvic swelling, mass and lump, unspecified site: Secondary | ICD-10-CM

## 2013-06-16 DIAGNOSIS — I2510 Atherosclerotic heart disease of native coronary artery without angina pectoris: Secondary | ICD-10-CM

## 2013-06-16 DIAGNOSIS — R399 Unspecified symptoms and signs involving the genitourinary system: Secondary | ICD-10-CM

## 2013-06-16 DIAGNOSIS — N183 Chronic kidney disease, stage 3 unspecified: Secondary | ICD-10-CM

## 2013-06-16 DIAGNOSIS — K859 Acute pancreatitis without necrosis or infection, unspecified: Secondary | ICD-10-CM

## 2013-06-16 DIAGNOSIS — I5043 Acute on chronic combined systolic (congestive) and diastolic (congestive) heart failure: Secondary | ICD-10-CM

## 2013-06-16 DIAGNOSIS — N4289 Other specified disorders of prostate: Secondary | ICD-10-CM | POA: Diagnosis present

## 2013-06-16 DIAGNOSIS — I38 Endocarditis, valve unspecified: Secondary | ICD-10-CM | POA: Diagnosis present

## 2013-06-16 DIAGNOSIS — I4891 Unspecified atrial fibrillation: Secondary | ICD-10-CM

## 2013-06-16 HISTORY — DX: Unspecified symptoms and signs involving the genitourinary system: R39.9

## 2013-06-16 HISTORY — DX: Endocarditis, valve unspecified: I38

## 2013-06-16 HISTORY — DX: Disease of pancreas, unspecified: K86.9

## 2013-06-16 HISTORY — DX: Right heart failure, unspecified: I50.810

## 2013-06-16 HISTORY — DX: Chronic kidney disease, stage 3 unspecified: N18.30

## 2013-06-16 LAB — BASIC METABOLIC PANEL
BUN: 26 mg/dL — ABNORMAL HIGH (ref 6–23)
CALCIUM: 9.7 mg/dL (ref 8.4–10.5)
CO2: 27 meq/L (ref 19–32)
Chloride: 96 mEq/L (ref 96–112)
Creatinine, Ser: 1.37 mg/dL — ABNORMAL HIGH (ref 0.50–1.35)
GFR calc Af Amer: 54 mL/min — ABNORMAL LOW (ref >90)
GFR calc non Af Amer: 46 mL/min — ABNORMAL LOW (ref >90)
GLUCOSE: 130 mg/dL — AB (ref 70–99)
Potassium: 5.2 mEq/L (ref 3.7–5.3)
Sodium: 138 mEq/L (ref 137–147)

## 2013-06-16 LAB — PATHOLOGIST SMEAR REVIEW: Path Review: REACTIVE

## 2013-06-16 LAB — POTASSIUM: Potassium: 4.5 mEq/L (ref 3.7–5.3)

## 2013-06-16 LAB — PROTIME-INR
INR: 1.93 — ABNORMAL HIGH (ref 0.00–1.49)
Prothrombin Time: 21.5 seconds — ABNORMAL HIGH (ref 11.6–15.2)

## 2013-06-16 MED ORDER — ACETAMINOPHEN 500 MG PO TABS: 1000.0000 mg | ORAL_TABLET | Freq: Four times a day (QID) | ORAL | Status: DC | PRN

## 2013-06-16 MED ORDER — FUROSEMIDE 40 MG PO TABS: 120.0000 mg | ORAL_TABLET | Freq: Two times a day (BID) | ORAL | Status: DC

## 2013-06-16 MED ORDER — POTASSIUM CHLORIDE ER 10 MEQ PO TBCR: 10.0000 meq | EXTENDED_RELEASE_TABLET | Freq: Every day | ORAL | Status: DC

## 2013-06-16 MED ORDER — WARFARIN SODIUM 5 MG PO TABS
5.0000 mg | ORAL_TABLET | Freq: Once | ORAL | Status: DC
  Filled 2013-06-16: qty 1

## 2013-06-16 NOTE — Discharge Summary (Signed)
Physician Discharge Summary  Gary REGALBUTO Sr. [id number] DOB: 08/20/1929 DOA: 06/12/2013  PCP: Garnet Koyanagi, DO  Admit date: 06/12/2013 Discharge date: 06/16/2013  Time spent: 30 minutes  Recommendations for Outpatient Follow-up:  1. Follow up with PCP in one week 2. Follow up with cardiology as recommended.  3. Follow up with bmp in one week.   Discharge Diagnoses:  Active Problems:   HYPERLIPIDEMIA   ANEMIA NOS   HYPERTENSION   CARDIOMYOPATHY, ISCHEMIC   LBBB   Permanent atrial fibrillation   GERD   Recurrent UTI   CAD (coronary artery disease)   Right heart failure   Valvular heart disease - moderate MR, mild AI, severe TR, mod PR by echo 05/2013   Pancreatic lesion   Finding of prostate - prominent by CT 05/2013   CKD (chronic kidney disease), stage III   Discharge Condition: improved.   Diet recommendation: low sodium diet  Filed Weights   06/12/13 1717 06/13/13 0019 06/16/13 0919  Weight: 99.791 kg (220 lb) 103.148 kg (227 lb 6.4 oz) 95.391 kg (210 lb 4.8 oz)    History of present illness:  Gary GILLOGLY Sr. is an 80 y.o. male with hx of CAD, ischemic cardiomyopathy, s/p CCY, HTN, CHF, hyperlipidemia, neurogenic bladder, afib on chronic anticoagualation with coumadin, presents to [facility] because of abdominal distention. He actually was rather asymptomatic. Family noted abdominal distention and call his PCP, who recommended that he can increase his Lasix. Family decided instead to go to the urgent care at [facility]. Work up there included an abdominal CT, which showed a 3.6cm complex cystic structure at the pancreatic ucinate. He has possible ascites, and there is evidence of pancreatitis. His lipase was normal. His WBC was normal, Hb of 10.9 g per dL, Cr was elevated to 1.8. His LFTs were normal. He did have a colonoscopy before, but it was in 2007. He had normal BM, and has no weight loss. Hospitalsit was asked to admit him to expedite the work up of his abdominal  distension. On arrival to the hospital an MRI of the abdomen was ordered, but could not be done as he could not fit in it. GI consulted for recommendations. He underwent a paracentesis on 4/17, ascitic fluid consistent with cardiac in origin. Dr Benson Norway recommended cardiology consultation. Meanwhile we requested for a n echocardiogram and he is on lasix BID. He reported to me that at home he is taking only 40 mg daily of lasix instead of 80 mg daily For the last 2 years. Echocardiogram showed good LVEF, . He was discharged on 120 mg BID lasix and recommended to follow up with PCP and cardiology in 1 to 2 weeks.      Hospital Course:   1. Pancreatitis and pancreatic mass with abdominal ascites:  - admitted to the med surg.  - MRI abdomen ordered but could not be done , it will done as outpatient. GI consulted , recommended paracentesis for diagnostic purposes. Analysis revealed cardiac source of ascites.  - he is currently asymptomatic without any abdominal pain, nausea or vomiting.  - advanced his diet to low sodium diet.  2. Hypertension  - controlled.  3. UTI: completed 5 days of IV antibiotics.  4. H/o systolic heart failure:  - echocardiogram ordered and results reviewed. .cardiology consulted and recommendations given to start him on 120 mg of lasix BID.     Procedures:  Echocardiogram.  Consultations: Cardiology consult.  Gastroenterology  Discharge Exam: Filed Vitals:   06/16/13  1002  BP: 101/58  Pulse:   Temp:   Resp:     General: alert afebrile comfortable Cardiovascular: s1s2 Respiratory: ctab  Discharge Instructions You were cared for by a hospitalist during your hospital stay. If you have any questions about your discharge medications or the care you received while you were in the hospital after you are discharged, you can call the unit and asked to speak with the hospitalist on call if the hospitalist that took care of you is not available. Once you are discharged,  your primary care physician will handle any further medical issues. Please note that NO REFILLS for any discharge medications will be authorized once you are discharged, as it is imperative that you return to your primary care physician (or establish a relationship with a primary care physician if you do not have one) for your aftercare needs so that they can reassess your need for medications and monitor your lab values.  Discharge Orders   Future Orders Complete By Expires   ([place]) Call MD:  Anytime you have any of the following symptoms: 1) 3 pound weight gain in 24 hours or 5 pounds in 1 week 2) shortness of breath, with or without a dry hacking cough 3) swelling in the hands, feet or stomach 4) if you have to sleep on extra pillows at night in order to breathe.  As directed    Call MD for:  difficulty breathing, headache or visual disturbances  As directed    Call MD for:  persistant dizziness or light-headedness  As directed    Call MD for:  persistant nausea and vomiting  As directed    Call MD for:  redness, tenderness, or signs of infection (pain, swelling, redness, odor or green/yellow discharge around incision site)  As directed    Diet - low sodium heart healthy  As directed    Increase activity slowly  As directed        Medication List         acetaminophen 500 MG tablet  Commonly known as:  TYLENOL  Take 1,000 mg by mouth 3 (three) times daily.     atorvastatin 20 MG tablet  Commonly known as:  LIPITOR  Take 1 tablet (20 mg total) by mouth daily.     carvedilol 12.5 MG tablet  Commonly known as:  COREG  Take 12.5 mg by mouth 2 (two) times daily with a meal.     clonazePAM 1 MG tablet  Commonly known as:  KLONOPIN  Take 1 mg by mouth at bedtime.     enalapril 20 MG tablet  Commonly known as:  VASOTEC  Take 20 mg by mouth 2 (two) times daily.     ferrous sulfate 325 (65 FE) MG tablet  Take 325 mg by mouth daily with breakfast.     folic acid 1 MG  tablet  Commonly known as:  FOLVITE  Take 1 mg by mouth daily.     furosemide 40 MG tablet  Commonly known as:  LASIX  Take 3 tablets (120 mg total) by mouth 2 (two) times daily.     GLUCOSAMINE CHONDROITIN JOINT PO  Take 1 tablet by mouth 2 (two) times daily.     lidocaine 2 % jelly  Commonly known as:  XYLOCAINE  Place 1 application into the urethra daily as needed (pain).     multivitamin capsule  Take 1 capsule by mouth daily.     ondansetron 8 MG tablet  Commonly known as:  ZOFRAN  Take 8 mg by mouth every 12 (twelve) hours as needed for nausea or vomiting.     potassium chloride 10 MEQ tablet  Commonly known as:  K-DUR  Take 1 tablet (10 mEq total) by mouth daily.     triamcinolone cream 0.1 %  Commonly known as:  KENALOG  Apply 1 application topically 2 (two) times daily as needed (rash).     URIBEL 118 MG Caps  Take 1-4 capsules by mouth daily.     VITAMIN D (CHOLECALCIFEROL) PO  Take 1 tablet by mouth daily.     warfarin 2.5 MG tablet  Commonly known as:  COUMADIN  Take 2.5 mg by mouth daily.       Allergies  Allergen Reactions  . Amoxicillin     REACTION: ha/nausea  . Diltiazem Hcl     REACTION: low bp  . Hydrocodone-Acetaminophen     REACTION: ha  . Niacin     REACTION: ha/nausea  . Niacin-Lovastatin Er     REACTION: ha/nausea  . Paroxetine     REACTION: ha/disoriented  . Propoxyphene N-Acetaminophen     REACTION: headache  . Pseudoephedrine     REACTION: stopped urine flow  . Sulfonamide Derivatives     REACTION: ha/nausea  . Tolterodine Tartrate     REACTION: bladder stopped       Follow-up Information   Follow up with Kirk Ruths, MD. (Office will call you to arrange your followup appointment with labwork and stress test. If you have not heard from them within 3 days, please call the office. This may need to be done in the [place] office since it is shorter notice.)    Specialty:  Cardiology   Contact information:   [id number]  [address] [place] [address] [phone]       Follow up with Garnet Koyanagi, DO. Schedule an appointment as soon as possible for a visit in 1 week.   Specialty:  Family Medicine   Contact information:   [phone] [place] [address] [phone]       Follow up with Beryle Beams, MD. Schedule an appointment as soon as possible for a visit in 4 months.   Specialty:  Gastroenterology   Contact information:   [address] [place] [address] [phone]        The results of significant diagnostics from this hospitalization (including imaging, microbiology, ancillary and laboratory) are listed below for reference.    Significant Diagnostic Studies: Ct Abdomen Pelvis Wo Contrast  06/12/2013   CLINICAL DATA:  Bloating  EXAM: CT ABDOMEN AND PELVIS WITHOUT CONTRAST  TECHNIQUE: Multidetector CT imaging of the abdomen and pelvis was performed following the standard protocol without IV contrast.  COMPARISON:  None.  FINDINGS: Moderate ascites within the abdomen.  There is stranding associated with the pancreatic head. Fat planes in the pancreatic at are obscured. Findings are worrisome for pancreatitis. There is a loculated fluid collection in the region of the uncinate process of the pancreas measuring 3.6 x 3.2 cm. There are no air bubbles within the fluid collection. Fluid extends from this area adjacent to the IVC on image 33. There is a large amount of free fluid in the lesser sac. The omentum is somewhat abnormal. See image 31. Within the omental fat, there are areas of either soft tissue or fluid density. Omental caking and peritoneal tumor would be difficult to exclude.  Foley catheter decompresses the bladder.  The prostate is prominent. It measures 7.2 cm in diameter.  Periportal edema in the liver is suspected. Nonspecific 1.1 x 2.1 cm hypodensity in the right lobe of the liver on image 32.  Spleen is within normal limits.  Adrenal glands are  unremarkable.  Kidneys are atrophic. Simple cyst in the upper pole of the left kidney.  Atherosclerotic vascular calcifications are noted. Small hiatal hernia is present.  Small right pleural effusion.  Degenerative changes in the lumbar spine. No vertebral compression deformity. Sclerotic focus in the left iliac bone.  IMPRESSION: There are inflammatory changes in the pancreas. Correlate with amylase and lipase.  3.6 cm fluid collection adjacent to the uncinate process of the pancreas. This may represent a pseudocyst, however follow-up studies to ensure resolution are recommended.  Large amount of free fluid in the abdomen including the lesser sac. The omentum may be abnormal. Omental spread of tumor cannot be excluded.  Prostate gland is prominent.   Electronically Signed   By: Maryclare Bean M.D.   On: 06/12/2013 20:52   US Paracentesis  06/13/2013   CLINICAL DATA:  History of complex pancreatic cystic lesion, CHF, ascites. Request is made for diagnostic paracentesis.  EXAM: ULTRASOUND GUIDED DIAGNOSTIC PARACENTESIS  COMPARISON:  None.  PROCEDURE: An ultrasound guided paracentesis was thoroughly discussed with the patient and questions answered. The benefits, risks, alternatives and complications were also discussed. The patient understands and wishes to proceed with the procedure. Written consent was obtained.  Ultrasound was performed to localize and mark an adequate pocket of fluid in the right lower quadrant of the abdomen. The area was then prepped and draped in the normal sterile fashion. 1% Lidocaine was used for local anesthesia. Under ultrasound guidance a 19 gauge Yueh catheter was introduced. Paracentesis was performed. The catheter was removed and a dressing applied.  Complications: None.  FINDINGS: A total of approximately 200 cc's of turbid,amber fluid was removed. The fluid sample was sent for laboratory analysis.  IMPRESSION: Successful ultrasound guided diagnostic paracentesis yielding 200 cc's of  ascites. Dermabond was applied over the puncture site post procedure to prevent future leakage of ascites.  Read by: Rowe Robert ,P.A.-C.   Electronically Signed   By: Markus Daft M.D.   On: 06/13/2013 17:17   Dg Abd Acute W/chest  06/12/2013   CLINICAL DATA:  Low pain and distention.  Nausea.  EXAM: ACUTE ABDOMEN SERIES (ABDOMEN 2 VIEW & CHEST 1 VIEW)  COMPARISON:  None.  FINDINGS: There is cardiomegaly. Prior CABG. Pulmonary vascularity is normal. Slight blunting of the right costophrenic angle may represent a small pleural effusion.  No free air in the abdomen. There is air in nondistended large and small bowel loops. There is moderate air in the stomach. Overall density in the abdomen suggest the possibility of ascites but this is not definitive.  There are degenerative changes in the spine but there is no acute osseous abnormality.  IMPRESSION: 1. Probable small right pleural effusion. 2. Possible ascites.   Electronically Signed   By: Rozetta Nunnery M.D.   On: 06/12/2013 18:56    Microbiology: No results found for this or any previous visit (from the past 240 hour(s)).   Labs: Basic Metabolic Panel:  Recent Labs Lab 06/12/13 1730 06/13/13 0639 06/14/13 0557 06/15/13 0650 06/16/13 1035  NA 137  --  138 136* 138  K 5.3  --  4.7 3.9 5.2  CL 97  --  101 97 96  CO2 30  --  28  27 27  GLUCOSE 126*  --  86 107* 130*  BUN 32*  --  29* 27* 26*  CREATININE 1.80* 1.56* 1.56* 1.47* 1.37*  CALCIUM 9.8  --  9.6 9.8 9.7  MG  --   --   --  1.9  --    Liver Function Tests:  Recent Labs Lab 06/12/13 1730 06/13/13 1828  AST 19 20  ALT 7 9  ALKPHOS 102 98  BILITOT 0.7 0.8  PROT 6.9 6.7  ALBUMIN 3.7 3.5    Recent Labs Lab 06/12/13 2119  LIPASE 46   No results found for this basename: AMMONIA,  in the last 168 hours CBC:  Recent Labs Lab 06/12/13 1730 06/13/13 0639 06/14/13 0557  WBC 5.2 5.1 3.7*  NEUTROABS 3.7  --   --   HGB 10.9* 10.2* 9.7*  HCT 34.8* 33.0* 31.8*  MCV 82.5  81.9 80.9  PLT 216 209 174   Cardiac Enzymes: No results found for this basename: CKTOTAL, CKMB, CKMBINDEX, TROPONINI,  in the last 168 hours BNP: BNP (last 3 results)  Recent Labs  06/14/13 0945  PROBNP 3928.0*   CBG: No results found for this basename: GLUCAP,  in the last 168 hours     Signed:  Hosie Poisson  [facility] 06/16/2013, 3:46 PM

## 2013-06-16 NOTE — Progress Notes (Signed)
[place] to be D/C'd Home per MD order.  Discussed with the patient and all questions fully answered.    Medication List         acetaminophen 500 MG tablet  Commonly known as:  TYLENOL  Take 1,000 mg by mouth 3 (three) times daily.     atorvastatin 20 MG tablet  Commonly known as:  LIPITOR  Take 1 tablet (20 mg total) by mouth daily.     carvedilol 12.5 MG tablet  Commonly known as:  COREG  Take 12.5 mg by mouth 2 (two) times daily with a meal.     clonazePAM 1 MG tablet  Commonly known as:  KLONOPIN  Take 1 mg by mouth at bedtime.     enalapril 20 MG tablet  Commonly known as:  VASOTEC  Take 20 mg by mouth 2 (two) times daily.     ferrous sulfate 325 (65 FE) MG tablet  Take 325 mg by mouth daily with breakfast.     folic acid 1 MG tablet  Commonly known as:  FOLVITE  Take 1 mg by mouth daily.     furosemide 40 MG tablet  Commonly known as:  LASIX  Take 3 tablets (120 mg total) by mouth 2 (two) times daily.     GLUCOSAMINE CHONDROITIN JOINT PO  Take 1 tablet by mouth 2 (two) times daily.     lidocaine 2 % jelly  Commonly known as:  XYLOCAINE  Place 1 application into the urethra daily as needed (pain).     multivitamin capsule  Take 1 capsule by mouth daily.     ondansetron 8 MG tablet  Commonly known as:  ZOFRAN  Take 8 mg by mouth every 12 (twelve) hours as needed for nausea or vomiting.     potassium chloride 10 MEQ tablet  Commonly known as:  K-DUR  Take 1 tablet (10 mEq total) by mouth daily.     triamcinolone cream 0.1 %  Commonly known as:  KENALOG  Apply 1 application topically 2 (two) times daily as needed (rash).     URIBEL 118 MG Caps  Take 1-4 capsules by mouth daily.     VITAMIN D (CHOLECALCIFEROL) PO  Take 1 tablet by mouth daily.     warfarin 2.5 MG tablet  Commonly known as:  COUMADIN  Take 2.5 mg by mouth daily.        VVS, Skin clean, dry and intact without evidence of skin break down, no evidence of skin tears  noted. IV catheter discontinued intact. Site without signs and symptoms of complications. Dressing and pressure applied.  An After Visit Summary was printed and given to the patient.  D/c education completed with patient/family including follow up instructions, medication list, d/c activities limitations if indicated, with other d/c instructions as indicated by MD - patient able to verbalize understanding, all questions fully answered.   Patient instructed to return to ED, call 911, or call MD for any changes in condition.   Patient escorted via [place], and D/C home via private auto.  Henriette Combs 06/16/2013 5:17 PM

## 2013-06-16 NOTE — Progress Notes (Signed)
D/w Dr. Sallyanne Kuster. Pt very much wants to go home. We will arrange outpatient nuclear stress test as well as f/u in 1 week with BMET/Mg (transition-of-care visiT). It is not clear this will be able to be in the [facility] office with Dr. Stanford Breed as this is usually booked out 1-2 months in advance. Office schedulers were busy so I left a message on our scheduling voicemail detailing the followup that needs to be arranged. Our office will call the patient with these appts. Dr. Sallyanne Kuster recommends 120mg  BID of Lasix at discharge. Dayna Dunn PA-C

## 2013-06-16 NOTE — Progress Notes (Signed)
ANTICOAGULATION CONSULT NOTE - Initial Consult  Pharmacy Consult for Coumadin Indication: atrial fibrillation  Allergies  Allergen Reactions  . Amoxicillin     REACTION: ha/nausea  . Diltiazem Hcl     REACTION: low bp  . Hydrocodone-Acetaminophen     REACTION: ha  . Niacin     REACTION: ha/nausea  . Niacin-Lovastatin Er     REACTION: ha/nausea  . Paroxetine     REACTION: ha/disoriented  . Propoxyphene N-Acetaminophen     REACTION: headache  . Pseudoephedrine     REACTION: stopped urine flow  . Sulfonamide Derivatives     REACTION: ha/nausea  . Tolterodine Tartrate     REACTION: bladder stopped    Patient Measurements: Height: 6\' 2"  (188 cm) Weight: 210 lb 4.8 oz (95.391 kg) IBW/kg (Calculated) : 82.2 Heparin Dosing Weight:   Vital Signs: Temp: 98.3 F (36.8 C) (04/20 0542) Temp src: Oral (04/20 0542) BP: 101/58 mmHg (04/20 1002) Pulse Rate: 97 (04/20 0756)  Labs:  Recent Labs  06/13/13 1828 06/14/13 0557 06/15/13 0650 06/16/13 0513  HGB  --  9.7*  --   --   HCT  --  31.8*  --   --   PLT  --  174  --   --   LABPROT 25.2*  --  22.5* 21.5*  INR 2.38*  --  2.05* 1.93*  CREATININE  --  1.56* 1.47*  --     Estimated Creatinine Clearance: 45 ml/min (by C-G formula based on Cr of 1.47).   Medical History: Past Medical History  Diagnosis Date  . Neurogenic bladder     has had bladder catheter changes at the urology office every 12 weeks  . Anemia   . Hyperlipidemia   . Hypertension   . Heart attack     1983  . Chronic systolic heart failure     a. prior EF 20-30%;  b. Echo 7/09: EF 35-40%, basal inferoposterior HK, mild AI, mild to moderate MR, moderate to marked LAE, mild to moderate TR, mildly increased PASP, moderate RAE;   c. Echo 01/2012:   mild LVH, EF 50%, Tr AI, mild MR, severe LAE, mod to severe RAE, mod to severe RVE, mod reduced RVSF, mod to severe TR, PASP 46  . Arrhythmia     afib  . Warfarin anticoagulation   . GERD (gastroesophageal  reflux disease)   . Anxiety   . Renal failure     Medications:  Prescriptions prior to admission  Medication Sig Dispense Refill  . acetaminophen (TYLENOL) 500 MG tablet Take 1,000 mg by mouth 3 (three) times daily.      Marland Kitchen atorvastatin (LIPITOR) 20 MG tablet Take 1 tablet (20 mg total) by mouth daily.  90 tablet  3  . carvedilol (COREG) 12.5 MG tablet Take 12.5 mg by mouth 2 (two) times daily with a meal.      . clonazePAM (KLONOPIN) 1 MG tablet Take 1 mg by mouth at bedtime.      . enalapril (VASOTEC) 20 MG tablet Take 20 mg by mouth 2 (two) times daily.      . ferrous sulfate 325 (65 FE) MG tablet Take 325 mg by mouth daily with breakfast.       . folic acid (FOLVITE) 1 MG tablet Take 1 mg by mouth daily.        . furosemide (LASIX) 40 MG tablet Take 80 mg by mouth daily.      . Glucos-Chondroit-Hyaluron-MSM (GLUCOSAMINE CHONDROITIN JOINT PO) Take  1 tablet by mouth 2 (two) times daily.      Marland Kitchen lidocaine (XYLOCAINE) 2 % jelly Place 1 application into the urethra daily as needed (pain).      . Meth-Hyo-M Bl-Na Phos-Ph Sal (URIBEL) 118 MG CAPS Take 1-4 capsules by mouth daily.       . Multiple Vitamin (MULTIVITAMIN) capsule Take 1 capsule by mouth daily.        . ondansetron (ZOFRAN) 8 MG tablet Take 8 mg by mouth every 12 (twelve) hours as needed for nausea or vomiting.      . triamcinolone cream (KENALOG) 0.1 % Apply 1 application topically 2 (two) times daily as needed (rash).      Marland Kitchen VITAMIN D, CHOLECALCIFEROL, PO Take 1 tablet by mouth daily.      Marland Kitchen warfarin (COUMADIN) 2.5 MG tablet Take 2.5 mg by mouth daily.        Assessment: 84yom on Coumadin PTA for Afib. Coumadin has been held (missed 2 doses) for abdominal and cardiac workup - resumed on 4/18. INR 1.93 - Hg 9.7, Plts wnl on 4/18 - No significant bleeding reported - PTA regimen: 2.5mg  daily (confirmed with patient)  Goal of Therapy:  INR 2-3   Plan:  1. Coumadin 5mg  po x 1 today 2. Daily INR  Maryanna Shape, PharmD, BCPS   Clinical Pharmacist  Pager: [phone]   06/16/2013,11:05 AM

## 2013-06-16 NOTE — Evaluation (Signed)
Physical Therapy Evaluation Patient Details Name: Gary [name]. MRN: [id number] DOB: 09-07-1929 Today's Date: 06/16/2013   History of Present Illness  Gary FRILOUX [name]. is an 80 y.o. male with hx of CAD, ischemic cardiomyopathy, s/p CCY, HTN,  CHF, hyperlipidemia, neurogenic bladder, afib on chronic anticoagualation with coumadin, presents to [facility] because of abdominal distention.   Clinical Impression  Patient presents without need for skilled PT as he is independent with mobility at this time.  Did educate in fall prevention tips for home.      Follow Up Recommendations No PT follow up    Equipment Recommendations  None recommended by PT    Recommendations for Other Services       Precautions / Restrictions Precautions Precautions: None      Mobility  Bed Mobility Overal bed mobility: Modified Independent                Transfers Overall transfer level: Modified independent                  Ambulation/Gait Ambulation/Gait assistance: Independent Ambulation Distance (Feet): 200 Feet Assistive device: None Gait Pattern/deviations: WFL(Within Functional Limits)   Gait velocity interpretation: at or above normal speed for age/gender General Gait Details: see DGI  Stairs            Wheelchair Mobility    Modified Rankin (Stroke Patients Only)       Balance                                 Standardized Balance Assessment Standardized Balance Assessment : Dynamic Gait Index   Dynamic Gait Index Level Surface: Normal Change in Gait Speed: Normal Gait with Horizontal Head Turns: Normal Gait with Vertical Head Turns: Normal Gait and Pivot Turn: Normal Step Over Obstacle: Normal Step Around Obstacles: Normal Steps:  (NT)       Pertinent Vitals/Pain No pain complaints    Home Living Family/patient expects to be discharged to:: Private residence Living Arrangements: Spouse/significant other;Children Available Help  at Discharge: Family;Available 24 hours/day Type of Home: House Home Access: Level entry     Home Layout: Two level;Able to live on main level with bedroom/bathroom Home Equipment: Walker - 4 wheels;Cane - single point;Electric scooter;Wheelchair - manual;Shower seat;Bedside commode Additional Comments: wife uses equipment, not patient    Prior Function Level of Independence: Independent               Hand Dominance   Dominant Hand: Left    Extremity/Trunk Assessment   Upper Extremity Assessment: Overall WFL for tasks assessed           Lower Extremity Assessment: Overall WFL for tasks assessed      Cervical / Trunk Assessment: Kyphotic  Communication   Communication: No difficulties  Cognition Arousal/Alertness: Awake/alert Behavior During Therapy: WFL for tasks assessed/performed Overall Cognitive Status: Within Functional Limits for tasks assessed                      General Comments General comments (skin integrity, edema, etc.): scored at least 21/24 on dynamic gait index with low fall risk.  Patient instructed in fall prevention techniques for home.    Exercises        Assessment/Plan    PT Assessment Patent does not need any further PT services  PT Diagnosis     PT Problem List    PT Treatment  Interventions     PT Goals (Current goals can be found in the Care Plan section) Acute Rehab PT Goals PT Goal Formulation: No goals set, d/c therapy    Frequency     Barriers to discharge        Co-evaluation               End of Session   Activity Tolerance: Patient tolerated treatment well Patient left: in bed;with call bell/phone within reach;with family/visitor present           Time: [id number] PT Time Calculation (min): 20 min   Charges:   PT Evaluation $Initial PT Evaluation Tier I: 1 Procedure PT Treatments $Self Care/Home Management: 8-22   PT G Codes:          Max Sane 06/16/2013, 4:08 PM Gary [name], [place] 06/16/2013

## 2013-06-16 NOTE — Plan of Care (Signed)
Problem: Phase III Progression Outcomes Goal: Foley discontinued Outcome: Adequate for Discharge Pt has chronic foley for urogenic bladder

## 2013-06-16 NOTE — Progress Notes (Signed)
Patient: Gary KERWICK Sr. / Admit Date: 06/12/2013 / Date of Encounter: 06/16/2013, 9:22 AM  Subjective  Feeling much better. Still with LEE but says this appears baseline for him.  Objective   Telemetry: not currently on telemetry  Physical Exam: Blood pressure 123/66, pulse 97, temperature 98.3 F (36.8 C), temperature source Oral, resp. rate 18, height 6\' 2"  (1.88 m), weight 227 lb 6.4 oz (103.148 kg), SpO2 95.00%. General: Well developed, well nourished pale WM in no acute distress. Head: Normocephalic, atraumatic, sclera non-icteric, no xanthomas, nares are without discharge. Neck: Supple. JVP mildly elevated. Lungs: Clear bilaterally to auscultation without wheezes, rales, or rhonchi. Breathing is unlabored. Heart: Irregular, rate controlled, S1 S2 with 2/6 systolic murmur at LLSB.  Abdomen: Soft, non-tender, distended with normoactive bowel sounds. No rebound/guarding. Extremities: No clubbing or cyanosis. 1-2+ bilateral pitting LEE. Neuro: Alert and oriented X 3. Moves all extremities spontaneously. Psych:  Responds to questions appropriately with a normal affect.   Intake/Output Summary (Last 24 hours) at 06/16/13 0922 Last data filed at 06/16/13 0756  Gross per 24 hour  Intake    684 ml  Output   4900 ml  Net  -4216 ml    Inpatient Medications:  . atorvastatin  20 mg Oral Daily  . carvedilol  12.5 mg Oral BID WC  . cefTRIAXone (ROCEPHIN)  IV  1 g Intravenous Q24H  . cholecalciferol  400 Units Oral Daily  . enalapril  20 mg Oral Daily  . ferrous sulfate  325 mg Oral Q breakfast  . folic acid  1 mg Oral Daily  . furosemide  120 mg Oral BID  . pantoprazole  40 mg Oral Daily  . vitamin B-6  500 mg Oral Daily  . URELLE  1 tablet Oral TID AC & HS  . Warfarin - Pharmacist Dosing Inpatient   Does not apply q1800   Infusions:    Labs:  Recent Labs  06/14/13 0557 06/15/13 0650  NA 138 136*  K 4.7 3.9  CL 101 97  CO2 28 27  GLUCOSE 86 107*  BUN 29* 27*    CREATININE 1.56* 1.47*  CALCIUM 9.6 9.8  MG  --  1.9    Recent Labs  06/13/13 1828  AST 20  ALT 9  ALKPHOS 98  BILITOT 0.8  PROT 6.7  ALBUMIN 3.5    Recent Labs  06/14/13 0557  WBC 3.7*  HGB 9.7*  HCT 31.8*  MCV 80.9  PLT 174   No results found for this basename: CKTOTAL, CKMB, TROPONINI,  in the last 72 hours No components found with this basename: POCBNP,  No results found for this basename: HGBA1C,  in the last 72 hours   Radiology/Studies:  Ct Abdomen Pelvis Wo Contrast  06/12/2013   CLINICAL DATA:  Bloating  EXAM: CT ABDOMEN AND PELVIS WITHOUT CONTRAST  TECHNIQUE: Multidetector CT imaging of the abdomen and pelvis was performed following the standard protocol without IV contrast.  COMPARISON:  None.  FINDINGS: Moderate ascites within the abdomen.  There is stranding associated with the pancreatic head. Fat planes in the pancreatic at are obscured. Findings are worrisome for pancreatitis. There is a loculated fluid collection in the region of the uncinate process of the pancreas measuring 3.6 x 3.2 cm. There are no air bubbles within the fluid collection. Fluid extends from this area adjacent to the IVC on image 33. There is a large amount of free fluid in the lesser sac. The omentum is somewhat abnormal.  See image 31. Within the omental fat, there are areas of either soft tissue or fluid density. Omental caking and peritoneal tumor would be difficult to exclude.  Foley catheter decompresses the bladder. The prostate is prominent. It measures 7.2 cm in diameter.  Periportal edema in the liver is suspected. Nonspecific 1.1 x 2.1 cm hypodensity in the right lobe of the liver on image 32.  Spleen is within normal limits.  Adrenal glands are unremarkable.  Kidneys are atrophic. Simple cyst in the upper pole of the left kidney.  Atherosclerotic vascular calcifications are noted. Small hiatal hernia is present.  Small right pleural effusion.  Degenerative changes in the lumbar spine.  No vertebral compression deformity. Sclerotic focus in the left iliac bone.  IMPRESSION: There are inflammatory changes in the pancreas. Correlate with amylase and lipase.  3.6 cm fluid collection adjacent to the uncinate process of the pancreas. This may represent a pseudocyst, however follow-up studies to ensure resolution are recommended.  Large amount of free fluid in the abdomen including the lesser sac. The omentum may be abnormal. Omental spread of tumor cannot be excluded.  Prostate gland is prominent.   Electronically Signed   By: Maryclare Bean M.D.   On: 06/12/2013 20:52   US Paracentesis  06/13/2013   CLINICAL DATA:  History of complex pancreatic cystic lesion, CHF, ascites. Request is made for diagnostic paracentesis.  EXAM: ULTRASOUND GUIDED DIAGNOSTIC PARACENTESIS  COMPARISON:  None.  PROCEDURE: An ultrasound guided paracentesis was thoroughly discussed with the patient and questions answered. The benefits, risks, alternatives and complications were also discussed. The patient understands and wishes to proceed with the procedure. Written consent was obtained.  Ultrasound was performed to localize and mark an adequate pocket of fluid in the right lower quadrant of the abdomen. The area was then prepped and draped in the normal sterile fashion. 1% Lidocaine was used for local anesthesia. Under ultrasound guidance a 19 gauge Yueh catheter was introduced. Paracentesis was performed. The catheter was removed and a dressing applied.  Complications: None.  FINDINGS: A total of approximately 200 cc's of turbid,amber fluid was removed. The fluid sample was sent for laboratory analysis.  IMPRESSION: Successful ultrasound guided diagnostic paracentesis yielding 200 cc's of ascites. Dermabond was applied over the puncture site post procedure to prevent future leakage of ascites.  Read by: Rowe Robert ,P.A.-C.   Electronically Signed   By: Markus Daft M.D.   On: 06/13/2013 17:17   Dg Abd Acute W/chest  06/12/2013    CLINICAL DATA:  Low pain and distention.  Nausea.  EXAM: ACUTE ABDOMEN SERIES (ABDOMEN 2 VIEW & CHEST 1 VIEW)  COMPARISON:  None.  FINDINGS: There is cardiomegaly. Prior CABG. Pulmonary vascularity is normal. Slight blunting of the right costophrenic angle may represent a small pleural effusion.  No free air in the abdomen. There is air in nondistended large and small bowel loops. There is moderate air in the stomach. Overall density in the abdomen suggest the possibility of ascites but this is not definitive.  There are degenerative changes in the spine but there is no acute osseous abnormality.  IMPRESSION: 1. Probable small right pleural effusion. 2. Possible ascites.   Electronically Signed   By: Rozetta Nunnery M.D.   On: 06/12/2013 18:56     Assessment and Plan  1. Suspected acute right heart failure with ascites (normal albumin) s/p paracentesis 2. Valvular heart disease with moderate MR, mild AI, severe TR, mod PR by echo this admission 3. CAD  s/p remote CABG, not on ASA due to being on Coumadin 4. Permanent AF, on coumadin 5. Abnormal CT with inflammatory pancreatic changes, adjascent fluid collection, with elevated CA CA 125 (?) 6. Prominent prostate gland by CT - has  chronic indwelling foley 7. UTI with + nitrite on UA, large Hgb, large leuks, being treated with abx 8. Normocytic anemia 9. Renal insufficiency suspected CKD stage III  - 9L out thus far. Weights not recorded but he continues to diurese. Symptomatically improved but appears to still have volume on board. Suspect RHF symptoms exacerbated by valvular heart disease. Will discuss further eval with MD. If pt is felt to need TEE at some point to further eval valves, he is apprehensive of having this due to having been told in the past he has a small esophagus when undergoing endoscopy in the past.   Also - please let us know when patient's discharge is being finalized so we can repeat his stress test as outpatient to evaluate for  ischemic etiology for heart failure. Aside from cardiology followup, recommend a) urology followup given prominent prostate and hgb on UA, b) arrangement of outpatient sleep study for sleep apnea given AF and HF, c) workup of his anemia and d) GI workup as planned to further evaluate pancreatic lesion.  Signed, Melina Copa PA-C  I have seen and examined the patient along with Melina Copa, PAC.  I have reviewed the chart, notes and new data.  I agree with PA's note.  Key new complaints: he intensely wants to go home. He has no difficulty breathing, even when walking Key examination changes: persistent moderate ascites and severe lower extremity edema to the hips. Key new findings / data: reviewed echo images from 2009, 2013 and 2015.   He has overwhelming evidence of right heart failure, rather than left heart failure  There has been a steady worsening of TR and progressive dilatation of the RV and worsening RV function, without much change in PA pressure or LV function or MR degree. TR has been severe as least since 2013, with clear hepatic vein flow reversal. Last echo describes systolic septal flattening - I think it is a diastolic phenomenon.  Consider RV ischemia, constrictive pericarditis or primary TR. Echo evaluation is impeded by the irregular rhythm, but mitral annulus DTI diastolic velocities are high, suggesting that this is not a primary LV myocardial problem or caused primarily by LV dysfunction.  He is still markedly hypervolemic. I suspect his dry weight is around 195 lb, where he weighed in October. Before further diagnostic evaluation, the first order of business is to relieve the excess volume. If TR improves with diuresis and reduction in RV size, primary TR can then be eliminated as a diagnosis. Outpatient nuclear perfusion study is to be scheduled. Diagnosis of constriction will require a dedicated imaging study (CT or preferably MRI), again as an outpatient.  Spent a long time  discussing sodium restriction, daily weight monitoring, signs and symptoms of CHF. recommend very early follow up (1 week) with BMET.  Sanda Klein, MD, [place] [phone] 06/16/2013, 3:28 PM

## 2013-06-16 NOTE — Plan of Care (Signed)
Problem: Food- and Nutrition-Related Knowledge Deficit (NB-1.1) Goal: Nutrition education Formal process to instruct or train a patient/client in a skill or to impart knowledge to help patients/clients voluntarily manage or modify food choices and eating behavior to maintain or improve health. Outcome: Completed/Met Date Met:  06/16/13  Nutrition Education Note  RD consulted for nutrition education regarding Low Sodium diet education. Dietary recall reveals that pt consumes very high sodium foods - including processed meats and cheeses, packaged foods, etc.  RD provided "Low Sodium Nutrition Therapy" handout from the Academy of [employer]. Reviewed patient's dietary recall. Provided examples on ways to decrease sodium intake in diet. Discouraged intake of processed foods and use of salt shaker. Encouraged fresh fruits and vegetables as well as whole grain sources of carbohydrates to maximize fiber intake.   RD discussed why it is important for patient to adhere to diet recommendations, and emphasized the role of fluids, foods to avoid, and importance of weighing self daily. Teach back method used.  Expect good compliance.  Body mass index is 26.99 kg/(m^2). Pt meets criteria for Overweoght based on current BMI.  Current diet order is Heart Healthy, patient is consuming approximately 100% of meals at this time. Labs and medications reviewed. No further nutrition interventions warranted at this time. RD contact information provided. If additional nutrition issues arise, please re-consult RD.   Inda Coke MS, RD, LDN Inpatient Registered Dietitian Pager: [phone] After-hours pager: [phone]

## 2013-06-19 ENCOUNTER — Encounter (HOSPITAL_BASED_OUTPATIENT_CLINIC_OR_DEPARTMENT_OTHER): Payer: Self-pay | Admitting: Emergency Medicine

## 2013-06-19 ENCOUNTER — Emergency Department (HOSPITAL_BASED_OUTPATIENT_CLINIC_OR_DEPARTMENT_OTHER)
Admission: EM | Admit: 2013-06-19 | Discharge: 2013-06-19 | Disposition: A | Discharge status: Home / Self Care | Payer: Medicare Other | Attending: Emergency Medicine | Admitting: Emergency Medicine

## 2013-06-19 DIAGNOSIS — Z7901 Long term (current) use of anticoagulants: Secondary | ICD-10-CM | POA: Insufficient documentation

## 2013-06-19 DIAGNOSIS — Y846 Urinary catheterization as the cause of abnormal reaction of the patient, or of later complication, without mention of misadventure at the time of the procedure: Secondary | ICD-10-CM | POA: Insufficient documentation

## 2013-06-19 DIAGNOSIS — T83091A Other mechanical complication of indwelling urethral catheter, initial encounter: Secondary | ICD-10-CM | POA: Insufficient documentation

## 2013-06-19 DIAGNOSIS — R319 Hematuria, unspecified: Secondary | ICD-10-CM

## 2013-06-19 DIAGNOSIS — Z87448 Personal history of other diseases of urinary system: Secondary | ICD-10-CM | POA: Insufficient documentation

## 2013-06-19 DIAGNOSIS — I5022 Chronic systolic (congestive) heart failure: Secondary | ICD-10-CM | POA: Insufficient documentation

## 2013-06-19 DIAGNOSIS — Z79899 Other long term (current) drug therapy: Secondary | ICD-10-CM | POA: Insufficient documentation

## 2013-06-19 DIAGNOSIS — I10 Essential (primary) hypertension: Secondary | ICD-10-CM | POA: Insufficient documentation

## 2013-06-19 DIAGNOSIS — T839XXA Unspecified complication of genitourinary prosthetic device, implant and graft, initial encounter: Secondary | ICD-10-CM

## 2013-06-19 DIAGNOSIS — Y929 Unspecified place or not applicable: Secondary | ICD-10-CM | POA: Insufficient documentation

## 2013-06-19 DIAGNOSIS — D649 Anemia, unspecified: Secondary | ICD-10-CM | POA: Insufficient documentation

## 2013-06-19 DIAGNOSIS — Z951 Presence of aortocoronary bypass graft: Secondary | ICD-10-CM | POA: Insufficient documentation

## 2013-06-19 DIAGNOSIS — Z880 Allergy status to penicillin: Secondary | ICD-10-CM | POA: Insufficient documentation

## 2013-06-19 DIAGNOSIS — Z87891 Personal history of nicotine dependence: Secondary | ICD-10-CM | POA: Insufficient documentation

## 2013-06-19 DIAGNOSIS — N289 Disorder of kidney and ureter, unspecified: Secondary | ICD-10-CM

## 2013-06-19 DIAGNOSIS — Y939 Activity, unspecified: Secondary | ICD-10-CM | POA: Insufficient documentation

## 2013-06-19 DIAGNOSIS — F411 Generalized anxiety disorder: Secondary | ICD-10-CM | POA: Insufficient documentation

## 2013-06-19 DIAGNOSIS — R109 Unspecified abdominal pain: Secondary | ICD-10-CM | POA: Insufficient documentation

## 2013-06-19 DIAGNOSIS — E785 Hyperlipidemia, unspecified: Secondary | ICD-10-CM | POA: Insufficient documentation

## 2013-06-19 DIAGNOSIS — Z8719 Personal history of other diseases of the digestive system: Secondary | ICD-10-CM | POA: Insufficient documentation

## 2013-06-19 DIAGNOSIS — Z8673 Personal history of transient ischemic attack (TIA), and cerebral infarction without residual deficits: Secondary | ICD-10-CM | POA: Insufficient documentation

## 2013-06-19 LAB — URINALYSIS, ROUTINE W REFLEX MICROSCOPIC
Bilirubin Urine: NEGATIVE
Glucose, UA: NEGATIVE mg/dL
Ketones, ur: NEGATIVE mg/dL
Leukocytes, UA: NEGATIVE
Nitrite: NEGATIVE
PROTEIN: 100 mg/dL — AB
Specific Gravity, Urine: 1.011 (ref 1.005–1.030)
Urobilinogen, UA: 0.2 mg/dL (ref 0.0–1.0)
pH: 7 (ref 5.0–8.0)

## 2013-06-19 LAB — BASIC METABOLIC PANEL
BUN: 37 mg/dL — ABNORMAL HIGH (ref 6–23)
CALCIUM: 9.8 mg/dL (ref 8.4–10.5)
CHLORIDE: 95 meq/L — AB (ref 96–112)
CO2: 31 meq/L (ref 19–32)
Creatinine, Ser: 1.8 mg/dL — ABNORMAL HIGH (ref 0.50–1.35)
GFR calc Af Amer: 39 mL/min — ABNORMAL LOW (ref >90)
GFR calc non Af Amer: 33 mL/min — ABNORMAL LOW (ref >90)
Glucose, Bld: 136 mg/dL — ABNORMAL HIGH (ref 70–99)
Potassium: 4.4 mEq/L (ref 3.7–5.3)
SODIUM: 137 meq/L (ref 137–147)

## 2013-06-19 LAB — URINE MICROSCOPIC-ADD ON

## 2013-06-19 LAB — PROTIME-INR
INR: 1.71 — ABNORMAL HIGH (ref 0.00–1.49)
Prothrombin Time: 19.6 seconds — ABNORMAL HIGH (ref 11.6–15.2)

## 2013-06-19 MED ORDER — DOXYCYCLINE HYCLATE 100 MG PO CAPS: 100.0000 mg | ORAL_CAPSULE | Freq: Two times a day (BID) | ORAL | Status: DC

## 2013-06-19 NOTE — ED Provider Notes (Signed)
CSN: [id number]     Arrival date & time 06/19/13  0038 History   First MD Initiated Contact with Patient 06/19/13 0059     Chief Complaint  Patient presents with  . Urinary Retention     (Consider location/radiation/quality/duration/timing/severity/associated sxs/prior Treatment) Patient is a 80 y.o. male presenting with abdominal pain. The history is provided by the patient. No language interpreter was used.  Abdominal Pain Pain location:  Suprapubic Pain quality: aching   Pain radiates to:  Does not radiate Pain severity:  Moderate Onset quality:  Sudden Timing:  Constant Progression:  Unchanged Context: not alcohol use   Context comment:  Foley catheter stopped working Relieved by:  Nothing Worsened by:  Nothing tried Ineffective treatments:  None tried Associated symptoms: no dysuria   Risk factors: no alcohol abuse     Past Medical History  Diagnosis Date  . Neurogenic bladder     has had bladder catheter changes at the urology office every 12 weeks  . Anemia   . Hyperlipidemia   . Hypertension   . Heart attack     1983  . Chronic systolic heart failure     a. prior EF 20-30%;  b. Echo 7/09: EF 35-40%, basal inferoposterior HK, mild AI, mild to moderate MR, moderate to marked LAE, mild to moderate TR, mildly increased PASP, moderate RAE;   c. Echo 01/2012:   mild LVH, EF 50%, Tr AI, mild MR, severe LAE, mod to severe RAE, mod to severe RVE, mod reduced RVSF, mod to severe TR, PASP 46  . Arrhythmia     afib  . Warfarin anticoagulation   . GERD (gastroesophageal reflux disease)   . Anxiety   . Renal failure    Past Surgical History  Procedure Laterality Date  . Appendectomy    . Cholecystectomy      1971  . Coronary artery bypass graft      1995   Family History  Problem Relation Age of Onset  . Cancer Mother   . Heart attack Father     deceased at 51, smoking, ETOH abuse  . Breast cancer Sister    History  Substance Use Topics  . Smoking status:  Former Research scientist (life sciences)  . Smokeless tobacco: Not on file  . Alcohol Use: No    Review of Systems  Gastrointestinal: Positive for abdominal pain.  Genitourinary: Negative for dysuria and flank pain.  All other systems reviewed and are negative.     Allergies  Amoxicillin; Diltiazem hcl; Hydrocodone-acetaminophen; Niacin; Niacin-lovastatin er; Paroxetine; Propoxyphene n-acetaminophen; Pseudoephedrine; Sulfonamide derivatives; and Tolterodine tartrate  Home Medications   Prior to Admission medications   Medication Sig Start Date End Date Taking? Authorizing Provider  acetaminophen (TYLENOL) 500 MG tablet Take 1,000 mg by mouth 3 (three) times daily.    Historical Provider, MD  atorvastatin (LIPITOR) 20 MG tablet Take 1 tablet (20 mg total) by mouth daily. 01/15/13   [name], MD  carvedilol (COREG) 12.5 MG tablet Take 12.5 mg by mouth 2 (two) times daily with a meal.    Historical Provider, MD  clonazePAM (KLONOPIN) 1 MG tablet Take 1 mg by mouth at bedtime.    Historical Provider, MD  enalapril (VASOTEC) 20 MG tablet Take 20 mg by mouth 2 (two) times daily.    Historical Provider, MD  ferrous sulfate 325 (65 FE) MG tablet Take 325 mg by mouth daily with breakfast.     Historical Provider, MD  folic acid (FOLVITE) 1 MG tablet Take  1 mg by mouth daily.      Historical Provider, MD  furosemide (LASIX) 40 MG tablet Take 3 tablets (120 mg total) by mouth 2 (two) times daily. 06/16/13   Hosie Poisson, MD  Glucos-Chondroit-Hyaluron-MSM (GLUCOSAMINE CHONDROITIN JOINT PO) Take 1 tablet by mouth 2 (two) times daily.    Historical Provider, MD  lidocaine (XYLOCAINE) 2 % jelly Place 1 application into the urethra daily as needed (pain).    Historical Provider, MD  Meth-Hyo-M Bl-Na Phos-Ph Sal (URIBEL) 118 MG CAPS Take 1-4 capsules by mouth daily.  05/02/12   Historical Provider, MD  Multiple Vitamin (MULTIVITAMIN) capsule Take 1 capsule by mouth daily.      Historical Provider, MD  ondansetron (ZOFRAN)  8 MG tablet Take 8 mg by mouth every 12 (twelve) hours as needed for nausea or vomiting.    Historical Provider, MD  potassium chloride (K-DUR) 10 MEQ tablet Take 1 tablet (10 mEq total) by mouth daily. 06/16/13   Hosie Poisson, MD  triamcinolone cream (KENALOG) 0.1 % Apply 1 application topically 2 (two) times daily as needed (rash).    Historical Provider, MD  VITAMIN D, CHOLECALCIFEROL, PO Take 1 tablet by mouth daily.    Historical Provider, MD  warfarin (COUMADIN) 2.5 MG tablet Take 2.5 mg by mouth daily.    Historical Provider, MD   BP 131/74  Pulse 97  Temp(Src) 98.3 F (36.8 C) (Oral)  Resp 18  Wt 209 lb (94.802 kg)  SpO2 98% Physical Exam  Constitutional: He is oriented to person, place, and time. He appears well-developed and well-nourished. No distress.  HENT:  Head: Normocephalic and atraumatic.  Mouth/Throat: Oropharynx is clear and moist.  Eyes: Conjunctivae are normal. Pupils are equal, round, and reactive to light.  Neck: Normal range of motion. Neck supple.  Cardiovascular: Normal rate, regular rhythm and intact distal pulses.   Pulmonary/Chest: Effort normal and breath sounds normal.  Abdominal: Soft. Bowel sounds are normal. There is no tenderness. There is no rebound and no guarding.  Musculoskeletal: Normal range of motion.  Neurological: He is alert and oriented to person, place, and time.  Skin: Skin is warm and dry.  Psychiatric: He has a normal mood and affect.    ED Course  Procedures (including critical care time) Labs Review Labs Reviewed  URINALYSIS, ROUTINE W REFLEX MICROSCOPIC  BASIC METABOLIC PANEL  PROTIME-INR    Imaging Review No results found.   EKG Interpretation None      MDM   Final diagnoses:  None    kNOWS  That he has hematuria.  Will start antibiotic, bactrim.  Follow up with your PMD and urology.      Carlisle Beers, MD 06/19/13 [phone]

## 2013-06-19 NOTE — ED Notes (Signed)
MD at bedside. 

## 2013-06-19 NOTE — Discharge Instructions (Signed)
Hematuria, Adult °Hematuria is blood in your urine. It can be caused by a bladder infection, kidney infection, prostate infection, kidney stone, or cancer of your urinary tract. Infections can usually be treated with medicine, and a kidney stone usually will pass through your urine. If neither of these is the cause of your hematuria, further workup to find out the reason may be needed. °It is very important that you tell your health care provider about any blood you see in your urine, even if the blood stops without treatment or happens without causing pain. Blood in your urine that happens and then stops and then happens again can be a symptom of a very serious condition. Also, pain is not a symptom in the initial stages of many urinary cancers. °HOME CARE INSTRUCTIONS  °· Drink lots of fluid, 3 4 quarts a day. If you have been diagnosed with an infection, cranberry juice is especially recommended, in addition to large amounts of water. °· Avoid caffeine, tea, and carbonated beverages, because they tend to irritate the bladder. °· Avoid alcohol because it may irritate the prostate. °· Only take over-the-counter or prescription medicines for pain, discomfort, or fever as directed by your health care provider. °· If you have been diagnosed with a kidney stone, follow your health care provider's instructions regarding straining your urine to catch the stone. °· Empty your bladder often. Avoid holding urine for long periods of time. °· After a bowel movement, women should cleanse front to back. Use each tissue only once. °· Empty your bladder before and after sexual intercourse if you are a male. °SEEK MEDICAL CARE IF: °You develop back pain, fever, a feeling of sickness in your stomach (nausea), or vomiting or if your symptoms are not better in 3 days. Return sooner if you are getting worse. °SEEK IMMEDIATE MEDICAL CARE IF:  °· You have a persistent fever, with a temperature of 101.8°F (38.8°C) or greater. °· You  develop severe vomiting and are unable to keep the medicine down. °· You develop severe back or abdominal pain despite taking your medicines. °· You begin passing a large amount of blood or clots in your urine. °· You feel extremely weak or faint, or you pass out. °MAKE SURE YOU:  °· Understand these instructions. °· Will watch your condition. °· Will get help right away if you are not doing well or get worse. °Document Released: 02/13/2005 Document Revised: 12/04/2012 Document Reviewed: 10/14/2012 °Exit[employer]® Patient Information ©2014 [employer]. ° °

## 2013-06-19 NOTE — ED Notes (Signed)
Pt c/o foley cath " blocked' pt reports unable to flush  X 2 hrs

## 2013-06-19 NOTE — ED Notes (Signed)
MD at bedside discussing test results and dispo plan of care. 

## 2013-06-19 NOTE — ED Notes (Signed)
Catheter draining well. Urine remains red.  Oozing of blood from meatus seems to have stopped.

## 2013-06-23 ENCOUNTER — Encounter: Payer: Medicare Other | Admitting: Nurse Practitioner

## 2013-06-23 ENCOUNTER — Other Ambulatory Visit: Payer: Medicare Other

## 2013-06-23 ENCOUNTER — Telehealth: Payer: Self-pay | Admitting: Family Medicine

## 2013-06-23 NOTE — Telephone Encounter (Signed)
Caller name:Tiffany Relation to HC:[id number] Call back number:[phone] Pharmacy:  Reason for call:  Tiffany was giving INR results.  1.8.   Also needing a verbal order scrape pt's wound/sore on left lower extremity near ankle.  Please contact Tiffany with [facility].

## 2013-06-23 NOTE — Telephone Encounter (Signed)
Pt needs ov-- he should have hosp f/u anyway

## 2013-06-23 NOTE — Telephone Encounter (Signed)
Spoke with Tiffany from [facility] who states the area on the L lower extremity appears to be more like cellulitis and she does not want to do anything to the area without first seeing what the PCP wants. Patient has been using Kenalog cream on the area.   Concerning the INR 1.8, pt currently takes 2.5mg  daily of warfarin. Any dosing changes and when does HH need to recheck INR?

## 2013-06-24 NOTE — Telephone Encounter (Addendum)
Went into the hospital for abdominal swelling.  Patient was hospitalized and discharged 5 days later per patient.  Since discharge, patient has been improving.  He has had a 27 lbs water weight loss.  Cardiologist increased his Lasix to 120 mg twice a day.  He is maintaining a low to no salt diet.  He has also been advised to walk back and forth within his home at least 2-3 times per day.  Weighs daily on same scale, same time of day, same amount of clothing.  He is to notify his provider if he notes a 3 lb weight gain in one day or a 5 lb weight gain in one week.  He is also continuing to treat left lower extremity wound with Kenalog cream. Wound is improving.  He remains relatively independent at home with some help from his wife with medication management.  Patient and wife are knowledgeable about his condition.  He is receiving home health services form [facility].    Medication List and allergies:  Updated and Reviewed Medication changes discussed with patient  90 day supply/mail order: n/a Local prescriptions:  [id number] - [place], [address]  To discuss with provider: Wound care treatment   Follow-up Appt scheduled for Fri., 06/27/2013 @ 4 pm

## 2013-06-24 NOTE — Addendum Note (Signed)
Addended by: Rudene Anda on: 06/24/2013 11:04 AM   Modules accepted: Orders, Medications

## 2013-06-24 NOTE — Telephone Encounter (Signed)
Called and spoke with Tiffany.  Made her aware that Dr. Etter Sjogren would like to see him for an office visit.

## 2013-06-27 ENCOUNTER — Ambulatory Visit (INDEPENDENT_AMBULATORY_CARE_PROVIDER_SITE_OTHER): Payer: Medicare Other | Admitting: Family Medicine

## 2013-06-27 ENCOUNTER — Other Ambulatory Visit: Payer: Medicare Other

## 2013-06-27 ENCOUNTER — Ambulatory Visit (INDEPENDENT_AMBULATORY_CARE_PROVIDER_SITE_OTHER): Payer: Medicare Other | Admitting: Nurse Practitioner

## 2013-06-27 ENCOUNTER — Encounter: Payer: Self-pay | Admitting: Family Medicine

## 2013-06-27 ENCOUNTER — Encounter: Payer: Self-pay | Admitting: Nurse Practitioner

## 2013-06-27 VITALS — BP 120/60 | HR 66 | Ht 73.0 in | Wt 200.1 lb

## 2013-06-27 VITALS — BP 118/68 | HR 92 | Temp 97.6°F | Wt 200.0 lb

## 2013-06-27 DIAGNOSIS — R0600 Dyspnea, unspecified: Secondary | ICD-10-CM

## 2013-06-27 DIAGNOSIS — IMO0002 Reserved for concepts with insufficient information to code with codable children: Secondary | ICD-10-CM

## 2013-06-27 DIAGNOSIS — R0609 Other forms of dyspnea: Secondary | ICD-10-CM

## 2013-06-27 DIAGNOSIS — S80829A Blister (nonthermal), unspecified lower leg, initial encounter: Secondary | ICD-10-CM

## 2013-06-27 DIAGNOSIS — B354 Tinea corporis: Secondary | ICD-10-CM

## 2013-06-27 DIAGNOSIS — I38 Endocarditis, valve unspecified: Secondary | ICD-10-CM

## 2013-06-27 DIAGNOSIS — I10 Essential (primary) hypertension: Secondary | ICD-10-CM

## 2013-06-27 DIAGNOSIS — R0989 Other specified symptoms and signs involving the circulatory and respiratory systems: Secondary | ICD-10-CM

## 2013-06-27 LAB — BASIC METABOLIC PANEL
BUN: 47 mg/dL — ABNORMAL HIGH (ref 6–23)
CO2: 26 mEq/L (ref 19–32)
Calcium: 9.2 mg/dL (ref 8.4–10.5)
Chloride: 95 mEq/L — ABNORMAL LOW (ref 96–112)
Creatinine, Ser: 1.9 mg/dL — ABNORMAL HIGH (ref 0.4–1.5)
GFR: 36.9 mL/min — ABNORMAL LOW (ref >60.00)
Glucose, Bld: 95 mg/dL (ref 70–99)
Potassium: 4.8 mEq/L (ref 3.5–5.1)
Sodium: 132 mEq/L — ABNORMAL LOW (ref 135–145)

## 2013-06-27 LAB — CBC
HCT: 32 % — ABNORMAL LOW (ref 39.0–52.0)
Hemoglobin: 10.3 g/dL — ABNORMAL LOW (ref 13.0–17.0)
MCHC: 32.3 g/dL (ref 30.0–36.0)
MCV: 79 fl (ref 78.0–100.0)
Platelets: 204 10*3/uL (ref 150.0–400.0)
RBC: 4.05 Mil/uL — ABNORMAL LOW (ref 4.22–5.81)
RDW: 17 % — ABNORMAL HIGH (ref 11.5–14.6)
WBC: 5.2 10*3/uL (ref 4.5–10.5)

## 2013-06-27 LAB — BRAIN NATRIURETIC PEPTIDE: Pro B Natriuretic peptide (BNP): 254 pg/mL — ABNORMAL HIGH (ref 0.0–100.0)

## 2013-06-27 MED ORDER — FUROSEMIDE 40 MG PO TABS
120.0000 mg | ORAL_TABLET | Freq: Two times a day (BID) | ORAL | Status: DC
Start: 2013-06-27 — End: 2013-08-07

## 2013-06-27 MED ORDER — NYSTATIN-TRIAMCINOLONE 100000-0.1 UNIT/GM-% EX OINT: 1.0000 "application " | TOPICAL_OINTMENT | Freq: Two times a day (BID) | CUTANEOUS | Status: DC

## 2013-06-27 NOTE — Progress Notes (Signed)
[place] Date of Birth: [birth year] Medical Record [id number]  History of Present Illness: [name] is seen back today for a post hospital visit/TOC - seen for Dr. Stanford Breed. He is an 80 year old male with HLD, anemia, HTN, ischemic CM with remote CABG, LBBB, chronic AF, GERD, CAD, valvular heart disease, CKD stage III and reported pancreatic lesion. He is on chronic coumadin.   Most recently in the hospital from 4/16 to 4/20 on the hospitalist service. Had abdominal distention. Called his PCP who advised extra dose of lasix, instead, they went to the urgent care at [facility] - abdominal CT showed a 3.6cm lesion on the pancreas, possible ascites and evidence of pancreatitis. MRI ordered but was not done as he could not fit in it. GI consulted. Had paracentesis. Cardiology then consulted due to the analysis of the fluid. Recommendations were as follows by cardiology:  "Ascites/heart failure: I suspect his ascites is secondary to right heart failure, given his previously documented moderate to severe RV enlargement with moderately reduced systolic function, and moderate to severe tricuspid regurgitation with PASP 46 mmHg. He has JVD to the angle of the jaw. I would recommend repeating an echocardiogram to evaluate both right and left-sided function, and to make certain LV systolic function is still preserved. Diastolic functional assessment will not be accurate in the setting of atrial fibrillation. He previously had mild mitral regurgitation but by exam, this appears to have progressed. Hepatic function panel is normal thus he probably does not have congestive hepatomegaly. I also recommend increasing IV Lasix to 60 mg tid with close monitoring of renal function. He should be evaluated by urology at some point given his prostate enlargement on CT, to make sure that there is no obvious impediment to diuresis. As an outpatient, he had been taking Lasix 80 mg bid, and he may end up requiring a higher dose  or a switch to torsemide with periodic metolazone. This can be decided after seeing his diuretic response and review of the echocardiogram. He may require a therapeutic paracentesis if IV diuretics do not provide an adequate response. I would recommend an outpatient stress test to evaluate for an ischemic etiology as well, given his history of CAD and CABG 20 years ago.  I do not see that he has a history of COPD. Given his atrial fibrillation and right heart failure, he should undergo a formal evaluation for sleep apnea as an outpatient.  I also recommend a dietary consultation so that he can be further educated on an appropriate diet for heart failure."  Lasix was increased. It is unclear as to what the plan for the pancreatic mass is.  Comes in today. Here with his son. He says he is doing better. Has really done a "180" in regards to his diet. No longer eating out. No chest pain. Breathing much better. Weight is already down 10 pounds. No pain anywhere. No fever or chills. No belly pain. His belly is much better according to him. Has a rash on his lower legs - going to see PCP for possible biopsy. Does not want to do a stress test right now. Seeing GI later this month. He did attempt MRI - apparently very claustrophobic, given sedation but they tell me the images were "poor". He was told that he moved - he denies. He understands the need for the MRI to further define this mass on the pancreas. He has a chronic foley in place - has better diuresis noted  at night.   Current Outpatient Prescriptions  Medication Sig Dispense Refill  . acetaminophen (TYLENOL) 500 MG tablet Take 1,000 mg by mouth 3 (three) times daily.      Marland Kitchen aspirin 81 MG tablet Take 81 mg by mouth daily.      Marland Kitchen atorvastatin (LIPITOR) 20 MG tablet Take 1 tablet (20 mg total) by mouth daily.  90 tablet  3  . carvedilol (COREG) 12.5 MG tablet Take 12.5 mg by mouth 2 (two) times daily with a meal.      . clonazePAM (KLONOPIN) 1 MG tablet  Take 1 mg by mouth at bedtime.      . enalapril (VASOTEC) 20 MG tablet Take 20 mg by mouth 2 (two) times daily.      . fenofibrate 54 MG tablet Take 54 mg by mouth 2 (two) times daily.      . ferrous sulfate 325 (65 FE) MG tablet Take 325 mg by mouth daily with breakfast.       . folic acid (FOLVITE) 1 MG tablet Take 1 mg by mouth daily.        . furosemide (LASIX) 40 MG tablet Take 3 tablets (120 mg total) by mouth 2 (two) times daily. Take 2 pills in the am and 3 pills in the pm  180 tablet  0  . Glucos-Chondroit-Hyaluron-MSM (GLUCOSAMINE CHONDROITIN JOINT PO) Take 1 tablet by mouth 2 (two) times daily.      Marland Kitchen lidocaine (XYLOCAINE) 2 % jelly Place 1 application into the urethra daily as needed (pain).      . Meth-Hyo-M Bl-Na Phos-Ph Sal (URIBEL) 118 MG CAPS Take 1-4 capsules by mouth daily.       . Multiple Vitamin (MULTIVITAMIN) capsule Take 1 capsule by mouth daily.        Marland Kitchen Nystatin (NYSTOP EX) Apply topically 3 (three) times daily.      . ondansetron (ZOFRAN) 8 MG tablet Take 8 mg by mouth every 12 (twelve) hours as needed for nausea or vomiting.      . potassium chloride (K-DUR) 10 MEQ tablet Take 1 tablet (10 mEq total) by mouth daily.  30 tablet  1  . triamcinolone cream (KENALOG) 0.1 % Apply 1 application topically 2 (two) times daily as needed (rash).      Marland Kitchen VITAMIN D, CHOLECALCIFEROL, PO Take 1 tablet by mouth daily.      Marland Kitchen warfarin (COUMADIN) 2.5 MG tablet Take 2.5 mg by mouth daily.       No current facility-administered medications for this visit.    Allergies  Allergen Reactions  . Amoxicillin     REACTION: ha/nausea  . Diltiazem Hcl     REACTION: low bp  . Hydrocodone-Acetaminophen     REACTION: ha  . Niacin     REACTION: ha/nausea  . Niacin-Lovastatin Er     REACTION: ha/nausea  . Paroxetine     REACTION: ha/disoriented  . Propoxyphene N-Acetaminophen     REACTION: headache  . Pseudoephedrine     REACTION: stopped urine flow  . Sulfonamide Derivatives      REACTION: ha/nausea  . Tolterodine Tartrate     REACTION: bladder stopped    Past Medical History  Diagnosis Date  . Neurogenic bladder     has had bladder catheter changes at the urology office every 12 weeks  . Anemia   . Hyperlipidemia   . Hypertension   . Heart attack     1983  . Chronic systolic heart failure  a. prior EF 20-30%;  b. Echo 7/09: EF 35-40%, basal inferoposterior HK, mild AI, mild to moderate MR, moderate to marked LAE, mild to moderate TR, mildly increased PASP, moderate RAE;   c. Echo 01/2012:   mild LVH, EF 50%, Tr AI, mild MR, severe LAE, mod to severe RAE, mod to severe RVE, mod reduced RVSF, mod to severe TR, PASP 46  . Arrhythmia     afib  . Warfarin anticoagulation   . GERD (gastroesophageal reflux disease)   . Anxiety   . Renal failure     Past Surgical History  Procedure Laterality Date  . Appendectomy    . Cholecystectomy      1971  . Coronary artery bypass graft      1995    History  Smoking status  . Former Smoker  Smokeless tobacco  . Not on file    History  Alcohol Use No    Family History  Problem Relation Age of Onset  . Cancer Mother   . Heart attack Father     deceased at 25, smoking, ETOH abuse  . Breast cancer Sister     Review of Systems: The review of systems is per the HPI.  All other systems were reviewed and are negative.  Physical Exam: BP 120/60  Pulse 66  Ht 6\' 1"  (1.854 m)  Wt 200 lb 1.9 oz (90.774 kg)  BMI 26.41 kg/m2  SpO2 97% Patient is very pleasant and in no acute distress. He looks quite chronically ill to me. Color is quite sallow. Multiple petechiae noted on his arms. Weight is down 9 pounds. Skin is warm and dry.  HEENT is unremarkable. Normocephalic/atraumatic. PERRL. Sclera are nonicteric. Neck is supple. No masses. No JVD at 90 degrees. Lungs are clear. Cardiac exam shows an irregular rhythm. Rate ok. Systolic murmur noted at the apex. Abdomen is quite protuberant but soft. Extremities are  with trace edema. Some brawny stasis changes. Blister on the lower left leg. Gait and ROM are intact. He is using a walker. No gross neurologic deficits noted.  Wt Readings from Last 3 Encounters:  06/27/13 200 lb 1.9 oz (90.774 kg)  06/19/13 209 lb (94.802 kg)  06/16/13 210 lb 4.8 oz (95.391 kg)     LABORATORY DATA: PENDING  Lab Results  Component Value Date   WBC 3.7* 06/14/2013   HGB 9.7* 06/14/2013   HCT 31.8* 06/14/2013   PLT 174 06/14/2013   GLUCOSE 136* 06/19/2013   CHOL 62 05/12/2013   TRIG 41.0 05/12/2013   HDL 26.90* 05/12/2013   LDLCALC 27 05/12/2013   ALT 9 06/13/2013   AST 20 06/13/2013   NA 137 06/19/2013   K 4.4 06/19/2013   CL 95* 06/19/2013   CREATININE 1.80* 06/19/2013   BUN 37* 06/19/2013   CO2 31 06/19/2013   TSH 0.99 03/31/2010   PSA 13.12* 10/20/2011   INR 1.71* 06/19/2013   Echo Study Conclusions  - Left ventricle: The cavity size was moderately dilated. Wall thickness was increased in a pattern of mild LVH. Systolic function was normal. The estimated ejection fraction was in the range of 50% to 55%. Regional wall motion abnormalities cannot be excluded. - Ventricular septum: Septal motion showed paradox. The contour showed systolic flattening. - Aortic valve: Mild regurgitation. - Mitral valve: Moderate regurgitation. - Left atrium: The atrium was severely dilated. - Right ventricle: The cavity size was severely dilated. Systolic function was moderately reduced. - Right atrium: The atrium was severely dilated. -  Atrial septum: No defect or patent foramen ovale was identified. - Tricuspid valve: Severe regurgitation. - Pulmonic valve: Moderate regurgitation. - Pulmonary arteries: PA peak pressure: 23mm Hg (S).   Assessment / Plan: 1. Valvular heart disease with associate right heart failure - weight is down - feels much better clinically. He does not want to proceed with stress testing at this time. Wants to get a little stronger. Cutting lasix back to 2  pills in the am and continue 3 at night. He is doing a much better job of restricting salt. Continue with daily weights. Recheck his labs today. May need to just focus on conservative management. See Dr. Stanford Breed back in a month in [facility].   2. Chronic atrial fib on coumadin. Rate ok  3. CAD - no active symptoms  4. Pancreatic mass - concerning - he has follow up with Dr. Benson Norway later this month to discuss repeat MRI in the open scanner with sedation.  5. CKD - not on ACE/ARB. Rechecking labs today.  6. Anemia - rechecking today.   Patient is agreeable to this plan and will call if any problems develop in the interim.   Burtis Junes, RN, [facility] [address] [place], [place]  [id number] [phone]

## 2013-06-27 NOTE — Patient Instructions (Signed)
Body Ringworm °Ringworm (tinea corporis) is a fungal infection of the skin on the body. This infection is not caused by worms, but is actually caused by a fungus. Fungus normally lives on the top of your skin and can be useful. However, in the case of ringworms, the fungus grows out of control and causes a skin infection. It can involve any area of skin on the body and can spread easily from one person to another (contagious). Ringworm is a common problem for children, but it can affect adults as well. Ringworm is also often found in athletes, especially wrestlers who share equipment and mats.  °CAUSES  °Ringworm of the body is caused by a fungus called dermatophyte. It can spread by: °· Touching other people who are infected. °· Touching infected pets. °· Touching or sharing objects that have been in contact with the infected person or pet (hats, combs, towels, clothing, sports equipment). °SYMPTOMS  °· Itchy, raised red spots and bumps on the skin. °· Ring-shaped rash. °· Redness near the border of the rash with a clear center. °· Dry and scaly skin on or around the rash. °Not every person develops a ring-shaped rash. Some develop only the red, scaly patches. °DIAGNOSIS  °Most often, ringworm can be diagnosed by performing a skin exam. Your caregiver may choose to take a skin scraping from the affected area. The sample will be examined under the microscope to see if the fungus is present.  °TREATMENT  °Body ringworm may be treated with a topical antifungal cream or ointment. Sometimes, an antifungal shampoo that can be used on your body is prescribed. You may be prescribed antifungal medicines to take by mouth if your ringworm is severe, keeps coming back, or lasts a long time.  °HOME CARE INSTRUCTIONS  °· Only take over-the-counter or prescription medicines as directed by your caregiver. °· Wash the infected area and dry it completely before applying your cream or ointment. °· When using antifungal shampoo to  treat the ringworm, leave the shampoo on the body for 3 5 minutes before rinsing.    °· Wear loose clothing to stop clothes from rubbing and irritating the rash. °· Wash or change your bed sheets every night while you have the rash. °· Have your pet treated by your veterinarian if it has the same infection. °To prevent ringworm:  °· Practice good hygiene. °· Wear sandals or shoes in public places and showers. °· Do not share personal items with others. °· Avoid touching red patches of skin on other people. °· Avoid touching pets that have bald spots or wash your hands after doing so. °SEEK MEDICAL CARE IF:  °· Your rash continues to spread after 7 days of treatment. °· Your rash is not gone in 4 weeks. °· The area around your rash becomes red, warm, tender, and swollen. °Document Released: 02/11/2000 Document Revised: 11/08/2011 Document Reviewed: 08/28/2011 °ExitCare® Patient Information ©2014 [employer]. ° °

## 2013-06-27 NOTE — Progress Notes (Signed)
   Subjective:    Patient ID: Gary [name]., male    DOB: 1929-11-13, 80 y.o.   MRN: [id number]  HPI Pt here with his son to f/u cellulitis low ext.  No swelling, no calf pain.  Pt has f/u with cards already.   Review of Systems As above    Objective:   Physical Exam  BP 118/68  Pulse 92  Temp(Src) 97.6 F (36.4 C) (Oral)  Wt 200 lb (90.719 kg)  SpO2 93% General appearance: alert, cooperative, appears stated age and no distress Extremities: no edema Skin: dry, scaley,  errythematous b/l  + blister-- cleaned with betadine and puncture with needle and culture done        Assessment & Plan:  1. Tinea corporis Mix with eucerin cream rto prn - nystatin-triamcinolone ointment (MYCOLOG); Apply 1 application topically 2 (two) times daily.  Dispense: 60 g; Refill: 0  2. Blister of leg  - Wound culture

## 2013-06-27 NOTE — Progress Notes (Signed)
Pre visit review using our clinic review tool, if applicable. No additional management support is needed unless otherwise documented below in the visit note. 

## 2013-06-27 NOTE — Patient Instructions (Signed)
We need to check lab today  Cut your Lasix back to 2 pills in the am and 3 in the pm  Keep restricting your salt  See Dr. Stanford Breed in a month  Let's postpone your stress test until seen back in follow up  Call the [place] office at [phone] if you have any questions, problems or concerns.

## 2013-06-28 ENCOUNTER — Other Ambulatory Visit: Payer: Self-pay | Admitting: Family Medicine

## 2013-06-30 NOTE — Telephone Encounter (Signed)
Last seen 06/27/13 and filled 03/30/13 #60. Please advise      KP

## 2013-06-30 NOTE — Telephone Encounter (Signed)
Rx faxed.    KP 

## 2013-07-02 ENCOUNTER — Telehealth: Payer: Self-pay | Admitting: *Deleted

## 2013-07-02 ENCOUNTER — Telehealth: Payer: Self-pay | Admitting: Family Medicine

## 2013-07-02 ENCOUNTER — Encounter (HOSPITAL_COMMUNITY): Payer: Medicare Other

## 2013-07-02 NOTE — Telephone Encounter (Signed)
Caller name:Tiffany Relation to [id number] Call back [phone] Pharmacy:  Reason for call: to give patient's INR results for today (2.0). Also patient wanted to know what was his wound culture results. Please advise.

## 2013-07-02 NOTE — Telephone Encounter (Signed)
inr normal-- recheck 2 weeks

## 2013-07-02 NOTE — Telephone Encounter (Signed)
Spoke with Tiffany and she agreed to go back to the home in 2 weeks for an INR recheck.     KP

## 2013-07-02 NOTE — Telephone Encounter (Signed)
Spoke with Gary [name] and I made her aware the results are not back and I will forward the INR results to the provider for her recommendations, she would also like to know if she will need to come back out next week to recheck his INR since it was WNL. Please advise     KP

## 2013-07-02 NOTE — Telephone Encounter (Signed)
Received home health certification and plan of care via fax from [place]. Billing sheet attached and placed in folder for Dr. Etter Sjogren. JG//CMA

## 2013-07-03 ENCOUNTER — Encounter (HOSPITAL_COMMUNITY): Payer: Medicare Other

## 2013-07-03 DIAGNOSIS — L97809 Non-pressure chronic ulcer of other part of unspecified lower leg with unspecified severity: Secondary | ICD-10-CM

## 2013-07-03 DIAGNOSIS — I872 Venous insufficiency (chronic) (peripheral): Secondary | ICD-10-CM

## 2013-07-03 DIAGNOSIS — I509 Heart failure, unspecified: Secondary | ICD-10-CM

## 2013-07-03 DIAGNOSIS — I5022 Chronic systolic (congestive) heart failure: Secondary | ICD-10-CM

## 2013-07-03 LAB — WOUND CULTURE
Gram Stain: NONE SEEN
Gram Stain: NONE SEEN
Gram Stain: NONE SEEN
Organism ID, Bacteria: NO GROWTH

## 2013-07-07 ENCOUNTER — Telehealth: Payer: Self-pay | Admitting: Nurse Practitioner

## 2013-07-07 ENCOUNTER — Telehealth: Payer: Self-pay

## 2013-07-07 DIAGNOSIS — L03119 Cellulitis of unspecified part of limb: Secondary | ICD-10-CM

## 2013-07-07 NOTE — Telephone Encounter (Signed)
Call from the patient and he stated that he is still having some redness in his legs. The redness is about the size of a softball. He said he is using Triamcinolone and Nystatin combo and gold bond moisturizer in the evening. He wants to know if there is something stronger that he should use since there is not much improvement, Please advise     KP

## 2013-07-07 NOTE — Telephone Encounter (Signed)
Based on his weights continuing to go down - would cut the Lasix back to just 2 pills 2 times a day.  Continue to weigh daily and take an extra dose for weight gain of 2 to 3 pounds overnight.

## 2013-07-07 NOTE — Telephone Encounter (Signed)
New message    Need to give Cecille Rubin updated wt 5-1 197lbs; 5-2 196.5lbs; 5-3 196.5lbs; 5-4 193-3lbs; 5-5-193.7lb; 5-6 192.2lbs; 5-7 192.7lbs; 5-8 190.6lbs; 5-9 190.0lbs; 5-10 190.2lbs; 5-11 188.0lbs. Pt takes furosemide 2pills in am and 3 pills in pm.

## 2013-07-07 NOTE — Telephone Encounter (Signed)
Pt aware and is agreeable to plan

## 2013-07-07 NOTE — Telephone Encounter (Signed)
Refer to derm  ?

## 2013-07-07 NOTE — Telephone Encounter (Signed)
Patient has been made aware and voiced understanding, Ref has been put in.,      KP

## 2013-07-10 ENCOUNTER — Other Ambulatory Visit: Payer: Self-pay | Admitting: Family Medicine

## 2013-07-11 DIAGNOSIS — L97809 Non-pressure chronic ulcer of other part of unspecified lower leg with unspecified severity: Secondary | ICD-10-CM

## 2013-07-11 DIAGNOSIS — I872 Venous insufficiency (chronic) (peripheral): Secondary | ICD-10-CM

## 2013-07-11 DIAGNOSIS — I509 Heart failure, unspecified: Secondary | ICD-10-CM

## 2013-07-11 DIAGNOSIS — I5022 Chronic systolic (congestive) heart failure: Secondary | ICD-10-CM

## 2013-07-14 NOTE — Telephone Encounter (Signed)
Please advise if refill is appropriate.     KP

## 2013-07-15 ENCOUNTER — Telehealth: Payer: Self-pay | Admitting: *Deleted

## 2013-07-15 LAB — PROTIME-INR: INR: 1.5 — AB (ref 0.9–1.1)

## 2013-07-15 NOTE — Telephone Encounter (Signed)
Received home health certification and plan of care via fax from [facility]. Billing sheet attached and placed in folder for Dr. Etter Sjogren. JG//CMA

## 2013-07-18 ENCOUNTER — Telehealth: Payer: Self-pay

## 2013-07-18 NOTE — Telephone Encounter (Signed)
INR results 1.51. Discussed with the patient and he is taking 2.5 mg daily.  Dr.Lowne wants the patient to take 2 tabs to and every Friday starting today and 2.5 mg on all other days. I discussed with the patient and he voiced understanding. He has agreed to take 5 mg or 2 tablets on fridays and 2.5mg  or 1 tab on all other days.      KP

## 2013-07-18 NOTE — Telephone Encounter (Signed)
I made Gary [name] aware as well.     KP

## 2013-07-19 ENCOUNTER — Other Ambulatory Visit: Payer: Self-pay | Admitting: Family Medicine

## 2013-07-22 ENCOUNTER — Telehealth: Payer: Self-pay | Admitting: *Deleted

## 2013-07-22 NOTE — Telephone Encounter (Signed)
Caller name:  Tiffany Relation to pt:  [facility] Call back number:  [phone] Pharmacy:  Reason for call:   Tiffany with [facility] called to let us know the pts INR this morning at 10:40am is 1.6

## 2013-07-22 NOTE — Telephone Encounter (Signed)
5 mg Mon, wed and 2.5 mg all other days-- recheck 1 week

## 2013-07-22 NOTE — Telephone Encounter (Signed)
Coumadin 5 mg or (2 tablets) on fridays and 2.5mg  or (1 tab) on all other days. INR on Friday was 1.5. Please advise     KP

## 2013-07-22 NOTE — Telephone Encounter (Signed)
Patient did not take the 5 mg, he told Tiffany that nothing had changed. I made her aware that he is to take 5 mg on Mon and Wed and 2.5mg  on all other days. Recheck in 1 week, she voiced understanding and will make the patient aware.     KP

## 2013-07-23 ENCOUNTER — Telehealth: Payer: Self-pay | Admitting: Cardiology

## 2013-07-23 NOTE — Telephone Encounter (Deleted)
Error

## 2013-07-23 NOTE — Telephone Encounter (Signed)
Spoke with pt, he reports he feels great. He is stable at present and will cont on the same medicine. He will contact us if he has any concerns.

## 2013-07-23 NOTE — Telephone Encounter (Signed)
New message   Patient calling regarding weight & taken furosemide 40 mg  Weight on   5/12 188.8  5/13 189.1 5/14 187.6 5/15 187.7 5/16 186.5 5/17 185.7 5/18 183.3 5/19 183.7 5/20 183.8 5/21 184.6 5/22 184.8 5/23 185.7 5/24 185.1 5/25 184.5 5/26 185.0 5/27 184.7.

## 2013-07-28 NOTE — Telephone Encounter (Signed)
Received completed and signed form from Dr. Etter Sjogren.  All forms faxed to [facility] at [phone]).  Confirmation received.//AB/CMA

## 2013-07-29 ENCOUNTER — Telehealth: Payer: Self-pay | Admitting: Cardiology

## 2013-07-29 DIAGNOSIS — I10 Essential (primary) hypertension: Secondary | ICD-10-CM

## 2013-07-29 NOTE — Telephone Encounter (Signed)
Spoke with pt wife, his current furosemide dose is 80 mg bid. Potassium is once daily, 10 MEQ. He has no edema or SOB ? If furosemide should be decreased' Will forward for dr Stanford Breed review

## 2013-07-29 NOTE — Telephone Encounter (Signed)
F/u ° ° °Pt returning your call °

## 2013-07-29 NOTE — Telephone Encounter (Signed)
Repeat bmet [name]

## 2013-07-29 NOTE — Telephone Encounter (Signed)
New message        Weekly Weight: 5/28 183.8lbs 5/29 183.0lbs 5/30 184.1lbs 5/31 184.6lbs 6/1 181.0lbs 6/2 178.7 Pt is reporting a weight of 178.7lbs he has lost 18lbs and he is concerned. Should he stop his potassium? Please return pt call.

## 2013-07-29 NOTE — Telephone Encounter (Signed)
Left message for pt to call.

## 2013-07-30 ENCOUNTER — Other Ambulatory Visit (INDEPENDENT_AMBULATORY_CARE_PROVIDER_SITE_OTHER): Payer: Medicare Other

## 2013-07-30 ENCOUNTER — Telehealth: Payer: Self-pay | Admitting: *Deleted

## 2013-07-30 DIAGNOSIS — I10 Essential (primary) hypertension: Secondary | ICD-10-CM

## 2013-07-30 LAB — BASIC METABOLIC PANEL WITH GFR
BUN: 91 mg/dL — ABNORMAL HIGH (ref 6–23)
CO2: 30 meq/L (ref 19–32)
Calcium: 9.7 mg/dL (ref 8.4–10.5)
Chloride: 101 mEq/L (ref 96–112)
Creat: 1.87 mg/dL — ABNORMAL HIGH (ref 0.50–1.35)
GFR, Est African American: 38 mL/min — ABNORMAL LOW
GFR, Est Non African American: 33 mL/min — ABNORMAL LOW
GLUCOSE: 93 mg/dL (ref 70–99)
POTASSIUM: 4.5 meq/L (ref 3.5–5.3)
Sodium: 141 mEq/L (ref 135–145)

## 2013-07-30 NOTE — Telephone Encounter (Addendum)
Spoke with pt, he would like to have the lab work drawn at his PCP. Spoke with dr lowne's office, pt to have labs drawn there today at 1:15 PM. Pt made aware

## 2013-07-30 NOTE — Telephone Encounter (Addendum)
Pt come in for a lab visit.  He brought in his INR results that was done this am by [facility] during home visit.  Pt was told by the [facility] nurse to inform Dr. Etter Sjogren of his results.  Pt stated that Dr. Etter Sjogren may want to change his Warfarin dose.  Pt's INR results:1.9.   Per pt he is taking Warfarin 2.5mg  daily except 5mg  on Mon and Thurs.  Verbally informed Dr. Etter Sjogren of the INR results.  Per verbal orders given by Dr. Etter Sjogren to have the pt take the Warfarin 2.5mg  daily except 5mg  on Mon,Wed,Fri., and recheck the INR in 1 week.  Informed the pt of Dr. Nonda Lou orders and pt understood and agreed.//AB/CMA

## 2013-07-31 ENCOUNTER — Other Ambulatory Visit: Payer: Self-pay | Admitting: *Deleted

## 2013-07-31 DIAGNOSIS — N289 Disorder of kidney and ureter, unspecified: Secondary | ICD-10-CM

## 2013-08-04 ENCOUNTER — Telehealth: Payer: Self-pay | Admitting: Cardiology

## 2013-08-04 NOTE — Telephone Encounter (Signed)
New  Message   Patient calling - no chest pain, no sob.    K+ and furosemide was stop for 3 days    6/4 183.1   Friday -Sunday  - no medication   6/5 183.5 6/6 185.8 6/7 187.3 Today 188.3 start back on two furosemide every morning   gain 2-5 lbs

## 2013-08-04 NOTE — Telephone Encounter (Signed)
Pt called to verify that he is to restart taking lasix 40 mg 2 tablets (80 mg) once a day. Pt states these three days he has been off this medication he has gained 5 lbs. Pt stated to take the Lasix 80 mg this morning. Pt is aware to call in a few days if the weigh is not down. Pt verbalized understanding.

## 2013-08-07 ENCOUNTER — Ambulatory Visit (INDEPENDENT_AMBULATORY_CARE_PROVIDER_SITE_OTHER): Payer: Medicare Other | Admitting: *Deleted

## 2013-08-07 ENCOUNTER — Other Ambulatory Visit: Payer: Self-pay | Admitting: Cardiology

## 2013-08-07 ENCOUNTER — Ambulatory Visit: Payer: Medicare Other | Admitting: Family Medicine

## 2013-08-07 ENCOUNTER — Other Ambulatory Visit (INDEPENDENT_AMBULATORY_CARE_PROVIDER_SITE_OTHER): Payer: Medicare Other

## 2013-08-07 ENCOUNTER — Encounter: Payer: Self-pay | Admitting: General Practice

## 2013-08-07 VITALS — BP 120/70 | HR 66 | Temp 97.7°F | Wt 188.0 lb

## 2013-08-07 DIAGNOSIS — I509 Heart failure, unspecified: Secondary | ICD-10-CM

## 2013-08-07 DIAGNOSIS — I4821 Permanent atrial fibrillation: Secondary | ICD-10-CM

## 2013-08-07 DIAGNOSIS — R7989 Other specified abnormal findings of blood chemistry: Secondary | ICD-10-CM

## 2013-08-07 DIAGNOSIS — Z7901 Long term (current) use of anticoagulants: Secondary | ICD-10-CM

## 2013-08-07 DIAGNOSIS — I4891 Unspecified atrial fibrillation: Secondary | ICD-10-CM

## 2013-08-07 LAB — BASIC METABOLIC PANEL
BUN: 61 mg/dL — ABNORMAL HIGH (ref 6–23)
CHLORIDE: 105 meq/L (ref 96–112)
CO2: 29 mEq/L (ref 19–32)
Calcium: 9.5 mg/dL (ref 8.4–10.5)
Creatinine, Ser: 1.8 mg/dL — ABNORMAL HIGH (ref 0.4–1.5)
GFR: 37.58 mL/min — AB (ref >60.00)
Glucose, Bld: 75 mg/dL (ref 70–99)
Potassium: 5 mEq/L (ref 3.5–5.1)
Sodium: 138 mEq/L (ref 135–145)

## 2013-08-07 LAB — POCT INR
INR: 1.9
INR: 1.9

## 2013-08-07 NOTE — Patient Instructions (Addendum)
Take 5mg  on Mon, Wed and Fri. On Tues, Thurs, Sat and Sun take 2.5mg . Recheck INR in 2 weeks when [facility] comes to see you.  -Please fax results of PT to Dr. Etter Sjogren at [phone]

## 2013-08-08 ENCOUNTER — Other Ambulatory Visit: Payer: Self-pay | Admitting: Family Medicine

## 2013-08-11 ENCOUNTER — Other Ambulatory Visit: Payer: Medicare Other

## 2013-08-12 ENCOUNTER — Other Ambulatory Visit: Payer: Self-pay | Admitting: Family Medicine

## 2013-08-13 ENCOUNTER — Ambulatory Visit (INDEPENDENT_AMBULATORY_CARE_PROVIDER_SITE_OTHER): Payer: Medicare Other | Admitting: Cardiology

## 2013-08-13 ENCOUNTER — Encounter: Payer: Self-pay | Admitting: Cardiology

## 2013-08-13 VITALS — BP 124/64 | HR 77 | Ht 73.0 in | Wt 192.1 lb

## 2013-08-13 DIAGNOSIS — E785 Hyperlipidemia, unspecified: Secondary | ICD-10-CM

## 2013-08-13 DIAGNOSIS — I50810 Right heart failure, unspecified: Secondary | ICD-10-CM

## 2013-08-13 DIAGNOSIS — I509 Heart failure, unspecified: Secondary | ICD-10-CM

## 2013-08-13 DIAGNOSIS — K869 Disease of pancreas, unspecified: Secondary | ICD-10-CM

## 2013-08-13 DIAGNOSIS — I4891 Unspecified atrial fibrillation: Secondary | ICD-10-CM

## 2013-08-13 DIAGNOSIS — I5042 Chronic combined systolic (congestive) and diastolic (congestive) heart failure: Secondary | ICD-10-CM

## 2013-08-13 DIAGNOSIS — I10 Essential (primary) hypertension: Secondary | ICD-10-CM

## 2013-08-13 DIAGNOSIS — I2510 Atherosclerotic heart disease of native coronary artery without angina pectoris: Secondary | ICD-10-CM

## 2013-08-13 DIAGNOSIS — I4821 Permanent atrial fibrillation: Secondary | ICD-10-CM

## 2013-08-13 MED ORDER — FUROSEMIDE 40 MG PO TABS: ORAL_TABLET | ORAL | Status: DC

## 2013-08-13 NOTE — Assessment & Plan Note (Signed)
Patient much improved.Significant diuresis during recent hospitalization. His weight continued to decrease and he became severely prerenal. His Lasix has been adjusted and he will now take 80 mg daily. He will take an additional 80 mg daily as needed if weight is over 195. Check potassium and renal function. We will consider repeat echocardiogram to reassess RV function and tricuspid regurgitation in the future. However he would like conservative measures for the most part. Note he was seen by GI for possible pancreatic mass and elected no further evaluation.

## 2013-08-13 NOTE — Assessment & Plan Note (Signed)
Continue present medications for rate control. Continue Coumadin.

## 2013-08-13 NOTE — Assessment & Plan Note (Signed)
Continue statin. Not on aspirin given need for Coumadin. 

## 2013-08-13 NOTE — Progress Notes (Signed)
HPI: FU CHF. Also with hx of CAD, status post CABG in 1995, ischemic cardiomyopathy, systolic CHF, permanent atrial fibrillation, LBBB, HTN, HL, CKD. Last echocardiogram in April 2015 showed an ejection fraction of 50-55%. There was severe left atrial enlargement, severe right ventricular enlargement, severe right atrial enlargement, severe tricuspid regurgitation, moderate PI, moderate mitral regurgitation and mild aortic insufficiency. Admitted in April of 2015 with ascites requiring paracentesis. This was felt possibly secondary to right heart failure and valvular heart disease. Patient was treated with diuresis. Note abdominal CT showed a 3.6 cm complex cystic structure in the pancreas. Patient seen in followup by GI and further workup felt not indicated as it pancreatic cancer demonstrated he would not be a candidate for surgery. Following discharge his edema improved. Laboratories showed worsening prerenal azotemia. We had decreased his Lasix. Since that time, He feels much better. He denies dyspnea, chest pain, palpitations or syncope.   Current Outpatient Prescriptions  Medication Sig Dispense Refill  . acetaminophen (TYLENOL) 500 MG tablet Take 1,000 mg by mouth 3 (three) times daily.      Marland Kitchen aspirin 81 MG tablet Take 81 mg by mouth 2 (two) times daily.       Marland Kitchen atorvastatin (LIPITOR) 20 MG tablet Take 1 tablet (20 mg total) by mouth daily.  90 tablet  3  . carvedilol (COREG) 12.5 MG tablet Take 12.5 mg by mouth 2 (two) times daily with a meal.      . clonazePAM (KLONOPIN) 1 MG tablet TAKE 1 TABLET BY MOUTH TWICE DAILY AS NEEDED FOR ANXIETY  60 tablet  0  . enalapril (VASOTEC) 20 MG tablet Take 20 mg by mouth 2 (two) times daily.      . fenofibrate 54 MG tablet Take 54 mg by mouth 2 (two) times daily.      . ferrous sulfate 325 (65 FE) MG tablet Take 325 mg by mouth daily with breakfast.       . folic acid (FOLVITE) 1 MG tablet Take 1 mg by mouth daily.        .  Glucos-Chondroit-Hyaluron-MSM (GLUCOSAMINE CHONDROITIN JOINT PO) Take 1 tablet by mouth 2 (two) times daily.      Marland Kitchen lidocaine (XYLOCAINE) 2 % jelly Place 1 application into the urethra daily as needed (pain).      . Meth-Hyo-M Bl-Na Phos-Ph Sal (URIBEL) 118 MG CAPS Take 1-4 capsules by mouth daily.       . Misc Natural Products (OSTEO BI-FLEX JOINT SHIELD PO) Take 650 mg by mouth 2 (two) times daily.      . Multiple Vitamin (MULTIVITAMIN) capsule Take 1 capsule by mouth daily.        Marland Kitchen nystatin-triamcinolone ointment (MYCOLOG) APPLY TO THE AFFECTED AREA THREE TIME A DAY      . ondansetron (ZOFRAN) 8 MG tablet Take 8 mg by mouth every 12 (twelve) hours as needed for nausea or vomiting.      . triamcinolone cream (KENALOG) 0.1 % APPLY TO THE AFFECTED AREA TWICE DAILY AS NEEDED. DO NOT USE ON FACE, GROIN OR UNDERARMS  454 g  0  . VITAMIN D, CHOLECALCIFEROL, PO Take 1 tablet by mouth daily.      Marland Kitchen warfarin (COUMADIN) 2.5 MG tablet TAKE 1 TABLET BY MOUTH EVERY DAY  90 tablet  0  . furosemide (LASIX) 40 MG tablet 2 TABLETS ONCE DAILY AND AS NEEDED FOR WEIGHT GAIN OVER 195  90 tablet  3   No current facility-administered medications  for this visit.     Past Medical History  Diagnosis Date  . Neurogenic bladder     has had bladder catheter changes at the urology office every 12 weeks  . Anemia   . Hyperlipidemia   . Hypertension   . Heart attack     1983  . Chronic systolic heart failure     a. prior EF 20-30%;  b. Echo 7/09: EF 35-40%, basal inferoposterior HK, mild AI, mild to moderate MR, moderate to marked LAE, mild to moderate TR, mildly increased PASP, moderate RAE;   c. Echo 01/2012:   mild LVH, EF 50%, Tr AI, mild MR, severe LAE, mod to severe RAE, mod to severe RVE, mod reduced RVSF, mod to severe TR, PASP 46  . Arrhythmia     afib  . Warfarin anticoagulation   . GERD (gastroesophageal reflux disease)   . Anxiety   . Renal failure     Past Surgical History  Procedure Laterality  Date  . Appendectomy    . Cholecystectomy      1971  . Coronary artery bypass graft      1995    History   Social History  . Marital Status: Married    Spouse Name: N/A    Number of Children: N/A  . Years of Education: N/A   Occupational History  . Retired     1994   Social History Main Topics  . Smoking status: Former Research scientist (life sciences)  . Smokeless tobacco: Not on file  . Alcohol Use: No  . Drug Use: No  . Sexual Activity: Not on file   Other Topics Concern  . Not on file   Social History Narrative  . No narrative on file    ROS: no fevers or chills, productive cough, hemoptysis, dysphasia, odynophagia, melena, hematochezia, dysuria, hematuria, rash, seizure activity, orthopnea, PND, pedal edema, claudication. Remaining systems are negative.  Physical Exam: Well-developed well-nourished in no acute distress.  Skin is warm and dry.  HEENT is normal.  Neck is supple.  Chest is clear to auscultation with normal expansion.  Cardiovascular exam is irregular Abdominal exam nontender or distended. No masses palpated. Mild ascites Extremities show no edema. neuro grossly intact  ECG Atrial fibrillation, left bundle branch block.

## 2013-08-13 NOTE — Assessment & Plan Note (Signed)
Blood pressure controlled. Continue present medications. 

## 2013-08-13 NOTE — Patient Instructions (Signed)
Your physician recommends that you schedule a follow-up appointment in: [place]  Your physician recommends that you HAVE LAB WORK TODAY  TAKE FUROSEMIDE 40 MG 2 TABLETS ONCE DAILY AND AS NEEDED FOR INCREASE IN WEIGHT OVER 195 LBS

## 2013-08-13 NOTE — Assessment & Plan Note (Signed)
Continue statin. 

## 2013-08-13 NOTE — Assessment & Plan Note (Signed)
Management per gastroenterology. 

## 2013-08-13 NOTE — Assessment & Plan Note (Signed)
Diuretics as outlined under right heart failure.

## 2013-08-15 LAB — BASIC METABOLIC PANEL WITH GFR
BUN: 57 mg/dL — ABNORMAL HIGH (ref 6–23)
CALCIUM: 9.3 mg/dL (ref 8.4–10.5)
CO2: 26 mEq/L (ref 19–32)
Chloride: 107 mEq/L (ref 96–112)
Creat: 1.7 mg/dL — ABNORMAL HIGH (ref 0.50–1.35)
GFR, EST NON AFRICAN AMERICAN: 37 mL/min — AB
GFR, Est African American: 42 mL/min — ABNORMAL LOW
Glucose, Bld: 95 mg/dL (ref 70–99)
POTASSIUM: 4.8 meq/L (ref 3.5–5.3)
Sodium: 142 mEq/L (ref 135–145)

## 2013-08-18 ENCOUNTER — Telehealth: Payer: Self-pay | Admitting: *Deleted

## 2013-08-18 NOTE — Telephone Encounter (Signed)
Received signed physician verbal order from Dr. Etter Sjogren.  Form faxed to [facility] homecare at [phone]).  Confirmation received.//AB/CMA

## 2013-08-20 ENCOUNTER — Other Ambulatory Visit: Payer: Self-pay | Admitting: Family Medicine

## 2013-08-22 ENCOUNTER — Other Ambulatory Visit: Payer: Self-pay | Admitting: *Deleted

## 2013-08-22 DIAGNOSIS — N289 Disorder of kidney and ureter, unspecified: Secondary | ICD-10-CM

## 2013-08-27 ENCOUNTER — Telehealth: Payer: Self-pay | Admitting: *Deleted

## 2013-08-27 DIAGNOSIS — L97809 Non-pressure chronic ulcer of other part of unspecified lower leg with unspecified severity: Secondary | ICD-10-CM

## 2013-08-27 DIAGNOSIS — I5022 Chronic systolic (congestive) heart failure: Secondary | ICD-10-CM

## 2013-08-27 DIAGNOSIS — I509 Heart failure, unspecified: Secondary | ICD-10-CM

## 2013-08-27 DIAGNOSIS — I872 Venous insufficiency (chronic) (peripheral): Secondary | ICD-10-CM

## 2013-08-27 NOTE — Telephone Encounter (Signed)
Received Home Health Certification and POC paperwork via fax from [facility] on (08-25-13).  Billing sheet attached forms and placed in folder for Dr. Etter Sjogren to to sign.//AB/CMA

## 2013-09-01 NOTE — Telephone Encounter (Signed)
Received completed and signed [place] and POC form from Dr. Etter Sjogren.  All forms faxed to [phone]).  Confirmation received.//AB/CMA

## 2013-09-03 ENCOUNTER — Telehealth: Payer: Self-pay | Admitting: Family Medicine

## 2013-09-03 NOTE — Telephone Encounter (Signed)
New Coumadin: 5 mg every day 2.5 mg Monday Wednesday and Friday INR in 2 weeks, fax to Dr Etter Sjogren

## 2013-09-03 NOTE — Telephone Encounter (Signed)
Caller name: Gary [name] Relation to pt: Mart Call back number:[phone]   Reason for call:   RN did INR on 7/2 and it was 1.8.  RN called Dr. Jacalyn Lefevre office and they finally called back after a week and said that they are not responsible for his INR and coumadin.  Pt takes 5mg  Mon, Wed and Fri, and 2.5 on Tue, Thur, Sat, and Sun.  RN wants to know who is responsible for the pt's checks and INR.

## 2013-09-04 NOTE — Telephone Encounter (Signed)
Pt and [facility] [employer] nurse both notified.

## 2013-09-05 ENCOUNTER — Other Ambulatory Visit: Payer: Medicare Other

## 2013-09-05 ENCOUNTER — Telehealth: Payer: Self-pay | Admitting: *Deleted

## 2013-09-05 ENCOUNTER — Other Ambulatory Visit (INDEPENDENT_AMBULATORY_CARE_PROVIDER_SITE_OTHER): Payer: Medicare Other

## 2013-09-05 DIAGNOSIS — N289 Disorder of kidney and ureter, unspecified: Secondary | ICD-10-CM

## 2013-09-05 LAB — BASIC METABOLIC PANEL
BUN: 74 mg/dL — AB (ref 6–23)
CHLORIDE: 98 meq/L (ref 96–112)
CO2: 30 mEq/L (ref 19–32)
Calcium: 9.7 mg/dL (ref 8.4–10.5)
Creatinine, Ser: 1.7 mg/dL — ABNORMAL HIGH (ref 0.4–1.5)
GFR: 40.62 mL/min — AB (ref >60.00)
Glucose, Bld: 115 mg/dL — ABNORMAL HIGH (ref 70–99)
POTASSIUM: 4 meq/L (ref 3.5–5.1)
SODIUM: 135 meq/L (ref 135–145)

## 2013-09-05 NOTE — Telephone Encounter (Signed)
Received Physician Verbal Order paperwork via fax from [facility].  Placed in folder for Dr. Etter Sjogren to sign.//AB/CMA

## 2013-09-08 ENCOUNTER — Telehealth: Payer: Self-pay | Admitting: Family Medicine

## 2013-09-08 MED ORDER — WARFARIN SODIUM 5 MG PO TABS: ORAL_TABLET | ORAL | Status: DC

## 2013-09-08 NOTE — Telephone Encounter (Signed)
Caller name: Salathiel Relation to [id number] Call back [phone] Pharmacy:[id number] - [place], [address] AT [address]   Reason for call: to request a refill for triamcinolone cream (KENALOG) 0.1 % and for Wafarin 5 mg. Patient is currently taking 2.5 but would like to increase to 5 mg. Please advise

## 2013-09-08 NOTE — Telephone Encounter (Signed)
Ok to switch 5 mg --  He is supposed to be taking 5 mg daily

## 2013-09-08 NOTE — Telephone Encounter (Signed)
Patient wants to switch to 5 mg tablets of coumadin. Please advise      KP

## 2013-09-08 NOTE — Telephone Encounter (Signed)
Rx sent      KP 

## 2013-09-09 ENCOUNTER — Other Ambulatory Visit: Payer: Self-pay | Admitting: *Deleted

## 2013-09-09 DIAGNOSIS — N19 Unspecified kidney failure: Secondary | ICD-10-CM

## 2013-09-09 MED ORDER — FUROSEMIDE 40 MG PO TABS: ORAL_TABLET | ORAL | Status: DC

## 2013-09-11 NOTE — Telephone Encounter (Signed)
Received signed Physician Verbal Order form from Dr. Etter Sjogren.  All forms faxed to [facility] at [phone]).  Confirmation received.//AB/CMA

## 2013-09-12 ENCOUNTER — Telehealth: Payer: Self-pay | Admitting: Family Medicine

## 2013-09-12 NOTE — Telephone Encounter (Signed)
Caller name:   Ulas Relation to pt: Call back": [phone] Pharmacy: [facility] [address]  Reason for call:  Pt is needing a refill on RX Meth-Hyo-M Bl-Na Phos-Ph Sal (URIBEL) 118 MG CAPS.  Pt states pharmacy has been sending faxes for the refill.  Pt understands we are busy and DR. Lowne has been out.

## 2013-09-15 NOTE — Telephone Encounter (Signed)
Pt called back to say that the last time he was here to see Dr. Etter Sjogren he was told that we would refill the Uribel for him.  Asked the pt who filled the prescription on (08-06-13), and he stated that he did remember.  Pt stated that he was also told by another doctor that his primary care doctor can refill the Uribel for him.  Pt stated that he would like for [place] to refill the Uribel, and he would like additional refills.  Informed the pt that Dr. Etter Sjogren and Maudie Mercury both are out of the office but when Dr. Etter Sjogren comes back I will inform her of the request and the additional refills.  Pt stated that the medicine does help with the bladder spasms.  He also stated that he did not need the medicine right now.//AB/CMA

## 2013-09-15 NOTE — Telephone Encounter (Signed)
Spoke with the pt to see which doctor prescribed the Uribel for him.  Informed him that I did not see where we have filled this prescription for him.  Pt stated it was given to him by Dr. Tresa Moore who was treating him for UTI.  Asked the pt if he called Dr. Zettie Pho office and he stated yes.  Informed the pt that he will need to call Dr. Zettie Pho office for the refill.  Pt stated that he would call Dr. Zettie Pho office back for the refill.//AB/CMA

## 2013-09-16 LAB — PROTIME-INR

## 2013-09-17 ENCOUNTER — Telehealth: Payer: Self-pay | Admitting: Cardiology

## 2013-09-17 NOTE — Telephone Encounter (Signed)
New message    Patient calling with daily weight . Came from Dr. Allyson Sabal gaining water in legs  . Was told by Dr. Stanford Breed to cut back water pill to 1 pill a day.    Weight today 191.6   Weight yesterday 189.  7/20 192. 7/19 193 7/18 189 7/17 189 7/16 189 7/15 wife in hospital  7/14 189 7/13 191.

## 2013-09-18 ENCOUNTER — Other Ambulatory Visit: Payer: Medicare Other

## 2013-09-18 NOTE — Telephone Encounter (Signed)
Lasix 40 daily with additional 40 daily as needed; fu BMET as scheduled Kirk Ruths

## 2013-09-18 NOTE — Telephone Encounter (Signed)
Spoke with pt, he is not having any SOB. He saw the dermatologist and he said there was slight edema and he needed to elevate his legs. He has taken furosemide 40 mg bid for the last three days. He has a cathiter he uses at night and gets better flow then. Will forward for dr Stanford Breed review

## 2013-09-18 NOTE — Telephone Encounter (Signed)
Spoke with pt, Aware of dr Jacalyn Lefevre recommendations.  He had labs drawn yesterday by [facility].

## 2013-09-19 ENCOUNTER — Telehealth: Payer: Self-pay

## 2013-09-19 NOTE — Telephone Encounter (Addendum)
Received INR results from the patient and the INR was 3.14 and PT was 32.5. Patient is currently taking 2.5 mg on M,W,F and 5 mg on all other days. Reviewed with Dr.Tabori and she advised to take Coumadin 5 mg on M,W,F and 2.5 on all other days and recheck in 2 weeks. Patient agreed to the new directions and voiced understanding of the directions.     KP

## 2013-09-23 ENCOUNTER — Other Ambulatory Visit: Payer: Self-pay | Admitting: Family Medicine

## 2013-09-23 NOTE — Telephone Encounter (Signed)
We can refill med

## 2013-09-23 NOTE — Telephone Encounter (Signed)
Please see note below and advise.//AB/CMA

## 2013-09-25 ENCOUNTER — Telehealth: Payer: Self-pay | Admitting: Cardiology

## 2013-09-25 ENCOUNTER — Other Ambulatory Visit: Payer: Self-pay | Admitting: *Deleted

## 2013-09-25 ENCOUNTER — Encounter: Payer: Self-pay | Admitting: Family Medicine

## 2013-09-25 DIAGNOSIS — R899 Unspecified abnormal finding in specimens from other organs, systems and tissues: Secondary | ICD-10-CM

## 2013-09-25 DIAGNOSIS — N19 Unspecified kidney failure: Secondary | ICD-10-CM

## 2013-09-25 DIAGNOSIS — Z79899 Other long term (current) drug therapy: Secondary | ICD-10-CM

## 2013-09-25 NOTE — Telephone Encounter (Signed)
Returned a call to patient. He informs me that his weight has gone up 7 pounds in one week. He now weighs 197 lbs. He is currently taking 40 mg of furosemide daily and feels this is just not controlling the fluid. He says that he also is drinking approximately 37 oz's of liquids daily. Stated to patient that drinking this much is not helping him if he is retaining fluid. This will only cause him to gain additional weight. Advised him per Dr. Jacalyn Lefevre message on 09/06/13 labs to take additional 80 mg if needed. Patient states that he does remember being told this by Hilda Blades.  He is calling to confirm this is what he is supposed to do. Recommended patient to follow the instructions previously given to him by Hilda Blades and Dr. Stanford Breed. He will also go back next week to get another B-MET drawn on increased dose of furosemide. Patient also requests a copy of the labs to be sent to him. Patient mentions that every time he decreases the furosemide his weight will go back up. Wants to know what he can do to prevent this from happening.

## 2013-09-25 NOTE — Telephone Encounter (Signed)
Pt weight is still increasing. He is already on 1 generic lasix a day.

## 2013-09-25 NOTE — Telephone Encounter (Signed)
Change lasix to 60 mg daily, decrease fluid intake to 1 liter daily, bmet one week Kirk Ruths

## 2013-09-26 MED ORDER — FUROSEMIDE 40 MG PO TABS: ORAL_TABLET | ORAL | Status: DC

## 2013-09-26 NOTE — Addendum Note (Signed)
Addended by: Cristopher Estimable on: 09/26/2013 08:11 AM   Modules accepted: Orders

## 2013-10-03 ENCOUNTER — Telehealth: Payer: Self-pay | Admitting: Cardiology

## 2013-10-03 DIAGNOSIS — N289 Disorder of kidney and ureter, unspecified: Secondary | ICD-10-CM

## 2013-10-03 NOTE — Telephone Encounter (Signed)
If the patient has not been taking the Lasix as suggested by Dr. Stanford Breed he should take it for the next two days.

## 2013-10-03 NOTE — Telephone Encounter (Signed)
Patient has been taking 60mg  lasix daily since dose was increased by Dr. Stanford Breed. He had BMET drawn 2-3 hours ago

## 2013-10-03 NOTE — Telephone Encounter (Signed)
Continue lasix 60 daily, additional 40 daily for weight greater than 197, await bmet results from today, follow weight Kirk Ruths

## 2013-10-03 NOTE — Telephone Encounter (Signed)
New message          Weight 7/30                               197 lbs 7/31                               197 lbs 8/1                                 196 lbs 8/2                                 197 lbs 8/3                                 197 lbs 8/4                                 196 lbs 8/5                                 196 lbs 8/6                                 196 lbs 8/7                                 198 lbs

## 2013-10-03 NOTE — Telephone Encounter (Signed)
Returned call to patient regarding weights. They appear stable but when I spoke with wife, she states Dr. Stanford Breed wants weight at 195lbs.   Patient denies SOB and edema.   Reminded them to get BMET today, which they will go to [facility] to do. Advised to get this done so we can get results to review in anticipation of possibly adjusting furosemide as needed.   Will defer to DOD, Dr. Percival Spanish to review.

## 2013-10-04 LAB — BASIC METABOLIC PANEL
BUN: 67 mg/dL — AB (ref 6–23)
CO2: 29 mEq/L (ref 19–32)
Calcium: 9 mg/dL (ref 8.4–10.5)
Chloride: 103 mEq/L (ref 96–112)
Creat: 2.33 mg/dL — ABNORMAL HIGH (ref 0.50–1.35)
Glucose, Bld: 114 mg/dL — ABNORMAL HIGH (ref 70–99)
Potassium: 4.6 mEq/L (ref 3.5–5.3)
Sodium: 139 mEq/L (ref 135–145)

## 2013-10-06 NOTE — Telephone Encounter (Signed)
Spoke with pt, he was instructed to take just 40 mg of lasix daily, see most recent lab results. He will have labs repeated Wednesday. He is not interested in seeing a nephrologist at this time

## 2013-10-08 ENCOUNTER — Telehealth: Payer: Self-pay | Admitting: Cardiology

## 2013-10-08 ENCOUNTER — Other Ambulatory Visit: Payer: Self-pay | Admitting: Family Medicine

## 2013-10-08 ENCOUNTER — Ambulatory Visit: Payer: Medicare Other | Admitting: Cardiology

## 2013-10-08 LAB — BASIC METABOLIC PANEL WITH GFR
BUN: 56 mg/dL — ABNORMAL HIGH (ref 6–23)
CALCIUM: 9 mg/dL (ref 8.4–10.5)
CHLORIDE: 100 meq/L (ref 96–112)
CO2: 28 meq/L (ref 19–32)
Creat: 2.11 mg/dL — ABNORMAL HIGH (ref 0.50–1.35)
GFR, Est African American: 33 mL/min — ABNORMAL LOW
GFR, Est Non African American: 28 mL/min — ABNORMAL LOW
GLUCOSE: 101 mg/dL — AB (ref 70–99)
Potassium: 4.4 mEq/L (ref 3.5–5.3)
SODIUM: 137 meq/L (ref 135–145)

## 2013-10-08 NOTE — Telephone Encounter (Signed)
New message    Patient calling wants to discuss furosemide 40 mg  Weight 198.

## 2013-10-08 NOTE — Telephone Encounter (Signed)
Spoke with pt, he has gained up to 198. He had his blood work done about one hour ago.  Explained to pt will wait on lab work results before making any changes. Patient voiced understanding

## 2013-10-08 NOTE — Telephone Encounter (Signed)
Last seen 06/27/13 and filled 06/30/13 #60.   Please advise     KP

## 2013-10-13 MED ORDER — URIBEL 118 MG PO CAPS: 1.0000 | ORAL_CAPSULE | Freq: Every day | ORAL | Status: DC

## 2013-10-13 NOTE — Addendum Note (Signed)
Addended by: Kris Hartmann on: 10/13/2013 02:06 PM   Modules accepted: Orders

## 2013-10-13 NOTE — Telephone Encounter (Signed)
Medication filled for pt per provider.

## 2013-10-15 ENCOUNTER — Telehealth: Payer: Self-pay | Admitting: Cardiology

## 2013-10-15 NOTE — Telephone Encounter (Signed)
Schedule fuov Gary [name]

## 2013-10-15 NOTE — Telephone Encounter (Signed)
Spoke with pt, he called to report his weight 10-08-13 198 lb no lasix in the am and 1 tab in the pm 8/13=196 0 am and 1 pm 8/14=199 0 am and 1 pm 8/15=199.4 1/2 am and 1pm 8/16= 197.7 1/2 am and 1 pm 8/17=196 1/2 am and 1 pm 8/18=195 1/2 am and 1 pm 8/19=196 1/2 am and 1 pm  He reports he is not able to keep his weight down with just 40 mg in the pm. He would like dr Stanford Breed to review these numbers. Will forward for dr Stanford Breed review

## 2013-10-15 NOTE — Telephone Encounter (Signed)
°  Patient is returning your call, please call back.  °

## 2013-10-15 NOTE — Telephone Encounter (Signed)
Spoke with pt, Follow up scheduled  

## 2013-10-16 ENCOUNTER — Telehealth: Payer: Self-pay

## 2013-10-16 NOTE — Telephone Encounter (Signed)
Received via fax on 10/15/13 from [facility] for SN to obtain specimen for PT INR on 10/22/13 and one week prior to all scheduled routine foley catheter change.  Order labeled and placed in Dr. Nonda Lou red folder for review and signature.

## 2013-10-17 NOTE — Telephone Encounter (Signed)
Signed and faxed back.

## 2013-10-22 ENCOUNTER — Telehealth: Payer: Self-pay

## 2013-10-22 NOTE — Telephone Encounter (Signed)
Hold Coumadin today.  Resume 2.5 mg daily w/ 5mg  on Monday/Friday.  Repeat in 2 weeks

## 2013-10-22 NOTE — Telephone Encounter (Signed)
Spoke with Delrae Rend and she voiced understanding, she said she will call the patient and make him aware as well.    KP

## 2013-10-22 NOTE — Telephone Encounter (Signed)
Call from [facility] with [facility] and she advised the patient's INR level is 3.9. He is currently taking 5 mg on M,W,F and Sun and taking 2.5 on T, TH and Sat. No bruising, No bleeding and no other symptoms. Please advise in Dr.Lowne's absence.     KP

## 2013-10-29 DIAGNOSIS — Z466 Encounter for fitting and adjustment of urinary device: Secondary | ICD-10-CM

## 2013-10-29 DIAGNOSIS — N319 Neuromuscular dysfunction of bladder, unspecified: Secondary | ICD-10-CM

## 2013-10-29 DIAGNOSIS — I4891 Unspecified atrial fibrillation: Secondary | ICD-10-CM

## 2013-10-29 DIAGNOSIS — I5022 Chronic systolic (congestive) heart failure: Secondary | ICD-10-CM

## 2013-10-30 ENCOUNTER — Telehealth: Payer: Self-pay

## 2013-10-30 NOTE — Telephone Encounter (Signed)
Received via fax on 10/29/13.  Forms labeled.  Billing sheet attached and placed in Dr. Nonda Lou red folder for review and signature.

## 2013-11-04 ENCOUNTER — Other Ambulatory Visit: Payer: Self-pay | Admitting: Family Medicine

## 2013-11-04 ENCOUNTER — Encounter: Payer: Self-pay | Admitting: Licensed Clinical Social Worker

## 2013-11-05 ENCOUNTER — Telehealth: Payer: Self-pay | Admitting: Family Medicine

## 2013-11-05 NOTE — Telephone Encounter (Signed)
inr 2.4  Taking 5 mg Monday  Friday  21/2 on all other days  Call nurse @ [phone]

## 2013-11-06 ENCOUNTER — Telehealth: Payer: Self-pay | Admitting: Cardiology

## 2013-11-06 NOTE — Telephone Encounter (Signed)
Take additional 40 mg lasix today; limit fluid intake to 1.2 liters daily total. Kirk Ruths

## 2013-11-06 NOTE — Telephone Encounter (Signed)
Returned a call to patient. He called to report his weight.  Starting on 10/26/13 through 11/06/13 - weight ranged from 195 lbs to 197 lbs , however today his weight is up to 201. Patient denies having any edema, or SOB. Takes 60 mg of furosemide daily. I informed patient per last phone conversation with Hilda Blades that Dr. Stanford Breed recommended for him to schedule a f/u OV. Patient  States that he has not made a appointment because his wife is now home bound and he is almost home bound as well. Says that transportation is extremely hard for them since they no longer drive. He informs me that he now has service through  [facility], ordered by Dr. Ivy Lynn coming to his house to Draw his PT/INR 's and do his catheter change. He states that he drinks 3-16 ounce bottles of water daily. He wants to know what Dr. Stanford Breed wants him to do about his weight gain up to 201 lbs. Will defer to Dr. Stanford Breed for review and recommendations.

## 2013-11-06 NOTE — Telephone Encounter (Signed)
con't and repeat in 2 weeks

## 2013-11-06 NOTE — Telephone Encounter (Signed)
Please advise      KP 

## 2013-11-06 NOTE — Telephone Encounter (Signed)
New message     Pt now weighs 201 lbs-----taking 60mg  of furosemide at night.  He is supposed to call when he has a wt increase.  Please advise

## 2013-11-06 NOTE — Telephone Encounter (Signed)
Gary [name] has been made and voiced understanding.    KP

## 2013-11-06 NOTE — Telephone Encounter (Signed)
Forms signed by Dr. Lowne and faxed back.  

## 2013-11-07 NOTE — Telephone Encounter (Signed)
Informed patient of directions. He states he took the additional 40 mg of Lasix last night anyway to his own judgement because he did not hear back, I did inform him that was the correct thing and to resume Lasix as previously directed. Patient was also directed to limit fluid intake to 1.2 liters which would be 2.3 of his 17 oz bottles. Patient voiced understanding

## 2013-11-11 ENCOUNTER — Other Ambulatory Visit: Payer: Self-pay | Admitting: Family Medicine

## 2013-11-11 NOTE — Telephone Encounter (Signed)
Last seen 06/27/13 and filled 10/08/13 #60. Please advise     KP

## 2013-11-18 ENCOUNTER — Other Ambulatory Visit: Payer: Self-pay | Admitting: Cardiology

## 2013-11-19 ENCOUNTER — Telehealth: Payer: Self-pay | Admitting: Family Medicine

## 2013-11-19 NOTE — Telephone Encounter (Signed)
Caller name: Yetta Flock / Caretaker  Relation to pt: [facility] [place]  Call back number: [phone]   Reason for call: caretaker wanted to advise INR 3.4.

## 2013-11-19 NOTE — Telephone Encounter (Signed)
INR- 3.4 Last INR- done on 11/05/13; results- 2.4   Pt is currently taking Coumadin 5 mg on M and F and 2.5 mg all other days.     Please advise.

## 2013-11-19 NOTE — Telephone Encounter (Signed)
Dr. Etter Sjogren out of office.  Spoke with Dr. Birdie Riddle and her verbal order was to Hold 2.5 mg dose today, then resume previous order (Coumadin 5 mg on M and F and 2.5 mg all other days).  Repeat INR in 2 weeks.    Called [name] at [employer].  Verbal order per Dr. Birdie Riddle given.  No further needs at this time.

## 2013-12-03 ENCOUNTER — Other Ambulatory Visit: Payer: Self-pay | Admitting: Family Medicine

## 2013-12-03 ENCOUNTER — Telehealth: Payer: Self-pay | Admitting: Family Medicine

## 2013-12-03 NOTE — Telephone Encounter (Signed)
Detailed message left for the nurse with detailed directions on hold coumadin on Thursday and Friday and check INR on Friday. I also made the patient aware to hold and we will have her come out on Friday to recheck, I will try to call her again.  Dannielle Karvonen do you mind trying to get the Nurse again before the end of the day to ensure she received the message.   KP

## 2013-12-03 NOTE — Telephone Encounter (Signed)
(  Coumadin 5 mg on M and F and 2.5 mg all other days).    Please advise     KP

## 2013-12-03 NOTE — Telephone Encounter (Signed)
Caller name: Alica Relation to [id number] homehealth Call back number: [phone]   Reason for call:  PT/INR results 4.2.  Pt has already taken coumadin for today, pt was educated that this should be a 5 or 6 pm medication.  RN is scheduled to see pt on 10/15, call back if there is any questions or dosage change.

## 2013-12-03 NOTE — Telephone Encounter (Signed)
Hold dose for Thursday and Friday. Resume at 2.5 mg every day until Friday. Friday am check his inr and notify us by noon so we can see if within range.

## 2013-12-03 NOTE — Telephone Encounter (Signed)
LM for nurse.  Called and spoke with patient who verbalized new directions back to me.

## 2013-12-03 NOTE — Telephone Encounter (Signed)
A user error has taken place.

## 2013-12-05 ENCOUNTER — Telehealth: Payer: Self-pay

## 2013-12-05 NOTE — Telephone Encounter (Signed)
Spoke with Elmo Putt at [facility]  INR today 3.1. Still holding coumadin.  [phone]  Please advise

## 2013-12-05 NOTE — Telephone Encounter (Signed)
Notify restart Coumadin 2.5 mg po daily starting today, then next week it will be 2.5 mg daily except 5 mg on Friday. Recheck INR next week

## 2013-12-07 ENCOUNTER — Other Ambulatory Visit: Payer: Self-pay | Admitting: Family Medicine

## 2013-12-08 NOTE — Telephone Encounter (Signed)
Spoke with Elmo Putt on Friday at [phone] and advised of new dosing. States that she will instruct patient and recheck his INR on Friday when she changes his catheter.

## 2013-12-10 ENCOUNTER — Ambulatory Visit (INDEPENDENT_AMBULATORY_CARE_PROVIDER_SITE_OTHER): Payer: Medicare Other

## 2013-12-10 ENCOUNTER — Ambulatory Visit: Payer: Medicare Other | Admitting: Cardiology

## 2013-12-10 DIAGNOSIS — Z23 Encounter for immunization: Secondary | ICD-10-CM

## 2013-12-12 ENCOUNTER — Emergency Department (HOSPITAL_BASED_OUTPATIENT_CLINIC_OR_DEPARTMENT_OTHER)
Admission: EM | Admit: 2013-12-12 | Discharge: 2013-12-12 | Disposition: A | Discharge status: Home / Self Care | Payer: Medicare Other | Attending: Emergency Medicine | Admitting: Emergency Medicine

## 2013-12-12 ENCOUNTER — Encounter (HOSPITAL_BASED_OUTPATIENT_CLINIC_OR_DEPARTMENT_OTHER): Payer: Self-pay | Admitting: Emergency Medicine

## 2013-12-12 DIAGNOSIS — Z87891 Personal history of nicotine dependence: Secondary | ICD-10-CM | POA: Insufficient documentation

## 2013-12-12 DIAGNOSIS — R319 Hematuria, unspecified: Secondary | ICD-10-CM | POA: Diagnosis not present

## 2013-12-12 DIAGNOSIS — N183 Chronic kidney disease, stage 3 unspecified: Secondary | ICD-10-CM

## 2013-12-12 DIAGNOSIS — Z951 Presence of aortocoronary bypass graft: Secondary | ICD-10-CM | POA: Diagnosis not present

## 2013-12-12 DIAGNOSIS — Z79899 Other long term (current) drug therapy: Secondary | ICD-10-CM | POA: Insufficient documentation

## 2013-12-12 DIAGNOSIS — F419 Anxiety disorder, unspecified: Secondary | ICD-10-CM | POA: Diagnosis not present

## 2013-12-12 DIAGNOSIS — D649 Anemia, unspecified: Secondary | ICD-10-CM | POA: Insufficient documentation

## 2013-12-12 DIAGNOSIS — K219 Gastro-esophageal reflux disease without esophagitis: Secondary | ICD-10-CM | POA: Diagnosis not present

## 2013-12-12 DIAGNOSIS — R1030 Lower abdominal pain, unspecified: Secondary | ICD-10-CM | POA: Diagnosis present

## 2013-12-12 DIAGNOSIS — Z8673 Personal history of transient ischemic attack (TIA), and cerebral infarction without residual deficits: Secondary | ICD-10-CM | POA: Diagnosis not present

## 2013-12-12 DIAGNOSIS — I5022 Chronic systolic (congestive) heart failure: Secondary | ICD-10-CM | POA: Diagnosis not present

## 2013-12-12 DIAGNOSIS — Z880 Allergy status to penicillin: Secondary | ICD-10-CM | POA: Diagnosis not present

## 2013-12-12 DIAGNOSIS — D689 Coagulation defect, unspecified: Secondary | ICD-10-CM

## 2013-12-12 DIAGNOSIS — Z7982 Long term (current) use of aspirin: Secondary | ICD-10-CM | POA: Diagnosis not present

## 2013-12-12 DIAGNOSIS — I129 Hypertensive chronic kidney disease with stage 1 through stage 4 chronic kidney disease, or unspecified chronic kidney disease: Secondary | ICD-10-CM | POA: Insufficient documentation

## 2013-12-12 DIAGNOSIS — R339 Retention of urine, unspecified: Secondary | ICD-10-CM | POA: Insufficient documentation

## 2013-12-12 DIAGNOSIS — Z7901 Long term (current) use of anticoagulants: Secondary | ICD-10-CM | POA: Diagnosis not present

## 2013-12-12 DIAGNOSIS — E785 Hyperlipidemia, unspecified: Secondary | ICD-10-CM | POA: Insufficient documentation

## 2013-12-12 LAB — COMPREHENSIVE METABOLIC PANEL
ALBUMIN: 3.8 g/dL (ref 3.5–5.2)
ALK PHOS: 43 U/L (ref 39–117)
ALT: 6 U/L (ref 0–53)
AST: 15 U/L (ref 0–37)
Anion gap: 12 (ref 5–15)
BILIRUBIN TOTAL: 0.6 mg/dL (ref 0.3–1.2)
BUN: 97 mg/dL — ABNORMAL HIGH (ref 6–23)
CO2: 30 mEq/L (ref 19–32)
Calcium: 10 mg/dL (ref 8.4–10.5)
Chloride: 96 mEq/L (ref 96–112)
Creatinine, Ser: 4 mg/dL — ABNORMAL HIGH (ref 0.50–1.35)
GFR calc Af Amer: 15 mL/min — ABNORMAL LOW (ref >90)
GFR calc non Af Amer: 13 mL/min — ABNORMAL LOW (ref >90)
Glucose, Bld: 129 mg/dL — ABNORMAL HIGH (ref 70–99)
POTASSIUM: 4.8 meq/L (ref 3.7–5.3)
Sodium: 138 mEq/L (ref 137–147)
TOTAL PROTEIN: 7.1 g/dL (ref 6.0–8.3)

## 2013-12-12 LAB — CBC WITH DIFFERENTIAL/PLATELET
BASOS ABS: 0 10*3/uL (ref 0.0–0.1)
BASOS PCT: 0 % (ref 0–1)
Eosinophils Absolute: 0.1 10*3/uL (ref 0.0–0.7)
Eosinophils Relative: 2 % (ref 0–5)
HCT: 30.1 % — ABNORMAL LOW (ref 39.0–52.0)
HEMOGLOBIN: 9.5 g/dL — AB (ref 13.0–17.0)
Lymphocytes Relative: 20 % (ref 12–46)
Lymphs Abs: 0.8 10*3/uL (ref 0.7–4.0)
MCH: 25.7 pg — ABNORMAL LOW (ref 26.0–34.0)
MCHC: 31.6 g/dL (ref 30.0–36.0)
MCV: 81.4 fL (ref 78.0–100.0)
MONOS PCT: 9 % (ref 3–12)
Monocytes Absolute: 0.4 10*3/uL (ref 0.1–1.0)
NEUTROS PCT: 69 % (ref 43–77)
Neutro Abs: 2.9 10*3/uL (ref 1.7–7.7)
PLATELETS: 158 10*3/uL (ref 150–400)
RBC: 3.7 MIL/uL — ABNORMAL LOW (ref 4.22–5.81)
RDW: 16 % — AB (ref 11.5–15.5)
WBC: 4.2 10*3/uL (ref 4.0–10.5)

## 2013-12-12 LAB — PROTIME-INR
INR: 2.97 — ABNORMAL HIGH (ref 0.00–1.49)
Prothrombin Time: 30.9 seconds — ABNORMAL HIGH (ref 11.6–15.2)

## 2013-12-12 LAB — URINALYSIS, ROUTINE W REFLEX MICROSCOPIC
Bilirubin Urine: NEGATIVE
Glucose, UA: NEGATIVE mg/dL
KETONES UR: NEGATIVE mg/dL
Nitrite: NEGATIVE
PH: 6 (ref 5.0–8.0)
Protein, ur: 100 mg/dL — AB
Specific Gravity, Urine: 1.008 (ref 1.005–1.030)
UROBILINOGEN UA: 0.2 mg/dL (ref 0.0–1.0)

## 2013-12-12 LAB — URINE MICROSCOPIC-ADD ON

## 2013-12-12 NOTE — ED Notes (Signed)
Foley bag emptied of 500cc dark red urine.

## 2013-12-12 NOTE — ED Notes (Signed)
Labs redrawn for lab report of qns.

## 2013-12-12 NOTE — ED Provider Notes (Signed)
CSN: [id number]     Arrival date & time 12/12/13  1425 History   First MD Initiated Contact with Patient 12/12/13 1534     No chief complaint on file.    (Consider location/radiation/quality/duration/timing/severity/associated sxs/prior Treatment) HPI Comments: He complains of lower abdominal pain and a catheter that has stopped draining urine since this morning. He has had a foley since 2002 and has a home nurse that changes it every 2 weeks, last changed this morning. He denies any difficulty with the exchange. Shortly after it was changed he noticed that it failed to drain and then became painful prompting ED visit. No vomiting, fever.  The history is provided by the patient. No language interpreter was used.    Past Medical History  Diagnosis Date  . Neurogenic bladder     has had bladder catheter changes at the urology office every 12 weeks  . Anemia   . Hyperlipidemia   . Hypertension   . Heart attack     1983  . Chronic systolic heart failure     a. prior EF 20-30%;  b. Echo 7/09: EF 35-40%, basal inferoposterior HK, mild AI, mild to moderate MR, moderate to marked LAE, mild to moderate TR, mildly increased PASP, moderate RAE;   c. Echo 01/2012:   mild LVH, EF 50%, Tr AI, mild MR, severe LAE, mod to severe RAE, mod to severe RVE, mod reduced RVSF, mod to severe TR, PASP 46  . Arrhythmia     afib  . Warfarin anticoagulation   . GERD (gastroesophageal reflux disease)   . Anxiety   . Renal failure    Past Surgical History  Procedure Laterality Date  . Appendectomy    . Cholecystectomy      1971  . Coronary artery bypass graft      1995   Family History  Problem Relation Age of Onset  . Cancer Mother   . Heart attack Father     deceased at 63, smoking, ETOH abuse  . Breast cancer Sister    History  Substance Use Topics  . Smoking status: Former Research scientist (life sciences)  . Smokeless tobacco: Not on file  . Alcohol Use: No    Review of Systems  Constitutional: Negative for fever  and chills.  Respiratory: Negative.   Cardiovascular: Negative.   Gastrointestinal: Positive for abdominal pain. Negative for nausea.  Genitourinary: Positive for hematuria and difficulty urinating.       See HPI.  Musculoskeletal: Negative.  Negative for myalgias.  Skin: Negative.  Negative for color change.  Neurological: Negative.       Allergies  Amoxicillin; Diltiazem hcl; Hydrocodone-acetaminophen; Niacin; Niacin-lovastatin er; Paroxetine; Propoxyphene n-acetaminophen; Pseudoephedrine; Sulfonamide derivatives; and Tolterodine tartrate  Home Medications   Prior to Admission medications   Medication Sig Start Date End Date Taking? Authorizing Provider  acetaminophen (TYLENOL) 500 MG tablet Take 1,000 mg by mouth 3 (three) times daily.    Historical Provider, MD  aspirin 81 MG tablet Take 81 mg by mouth 2 (two) times daily.     Historical Provider, MD  atorvastatin (LIPITOR) 20 MG tablet TAKE 1 TABLET BY MOUTH EVERY DAY 11/19/13   [name], MD  carvedilol (COREG) 12.5 MG tablet TAKE 1 TABLET BY MOUTH TWICE DAILY WITH MEALS 11/04/13   Alferd Apa Lowne, DO  clonazePAM (KLONOPIN) 1 MG tablet TAKE 1 TABLET BY MOUTH TWICE DAILY AS NEEDED FOR ANXIETY 11/11/13   Alferd Apa Lowne, DO  enalapril (VASOTEC) 20 MG tablet Take 20 mg  by mouth 2 (two) times daily.    Historical Provider, MD  enalapril (VASOTEC) 20 MG tablet TAKE 1 TABLET BY MOUTH TWICE DAILY    [name], DO  fenofibrate 54 MG tablet Take 54 mg by mouth 2 (two) times daily.    Historical Provider, MD  fenofibrate 54 MG tablet Take 1 tablet (54 mg total) by mouth 2 (two) times daily. Repeat labs are due now 12/03/13   Rosalita Chessman, DO  ferrous sulfate 325 (65 FE) MG tablet Take 325 mg by mouth daily with breakfast.     Historical Provider, MD  folic acid (FOLVITE) 1 MG tablet Take 1 mg by mouth daily.      Historical Provider, MD  furosemide (LASIX) 40 MG tablet One tablet and one half daily and AS NEEDED FOR WEIGHT GAIN OVER  195 09/26/13   [name], MD  Glucos-Chondroit-Hyaluron-MSM (GLUCOSAMINE CHONDROITIN JOINT PO) Take 1 tablet by mouth 2 (two) times daily.    Historical Provider, MD  lidocaine (XYLOCAINE) 2 % jelly Place 1 application into the urethra daily as needed (pain).    Historical Provider, MD  Meth-Hyo-M Bl-Na Phos-Ph Sal (URIBEL) 118 MG CAPS Take 1-4 capsules (118-472 mg total) by mouth daily. 10/13/13   Rosalita Chessman, DO  Misc Natural Products (OSTEO BI-FLEX JOINT SHIELD PO) Take 650 mg by mouth 2 (two) times daily.    Historical Provider, MD  Multiple Vitamin (MULTIVITAMIN) capsule Take 1 capsule by mouth daily.      Historical Provider, MD  nystatin-triamcinolone ointment (MYCOLOG) APPLY TO THE AFFECTED AREA THREE TIME A DAY    [name], DO  ondansetron (ZOFRAN) 8 MG tablet Take 8 mg by mouth every 12 (twelve) hours as needed for nausea or vomiting.    Historical Provider, MD  ondansetron (ZOFRAN) 8 MG tablet TAKE 1 TABLET BY MOUTH EVERY 12 HOURS AS NEEDED FOR FOR NAUSEA 11/11/13   Alferd Apa Lowne, DO  triamcinolone cream (KENALOG) 0.1 % APPLY TO THE AFFECTED AREA TWICE DAILY AS NEEDED. DO NOT USE ON FACE, GROIN OR UNDERARMS    [name], DO  VITAMIN D, CHOLECALCIFEROL, PO Take 1 tablet by mouth daily.    Historical Provider, MD  warfarin (COUMADIN) 5 MG tablet TAKE 1 TABLET BY MOUTH EVERY DAY AS DIRECTED 12/08/13   Alferd Apa Lowne, DO   BP 105/54  Pulse 69  Temp(Src) 98.3 F (36.8 C) (Oral)  Resp 18  Wt 192 lb (87.091 kg)  SpO2 99% Physical Exam  Constitutional: He is oriented to person, place, and time. He appears well-developed and well-nourished. No distress.  Abdominal: Soft. He exhibits no distension. There is no tenderness.  *Examined after foley catheter changed in the ED.  Genitourinary:  Marked hematuria with continuous drainage into foley bag.   Neurological: He is alert and oriented to person, place, and time.  Skin: Skin is warm and dry.  Psychiatric: He has a  normal mood and affect.    ED Course  Procedures (including critical care time) Labs Review Labs Reviewed  URINALYSIS, ROUTINE W REFLEX MICROSCOPIC - Abnormal; Notable for the following:    Color, Urine BROWN (*)    APPearance TURBID (*)    Hgb urine dipstick LARGE (*)    Protein, ur 100 (*)    Leukocytes, UA TRACE (*)    All other components within normal limits  CBC WITH DIFFERENTIAL - Abnormal; Notable for the following:    RBC 3.70 (*)  Hemoglobin 9.5 (*)    HCT 30.1 (*)    MCH 25.7 (*)    RDW 16.0 (*)    All other components within normal limits  COMPREHENSIVE METABOLIC PANEL - Abnormal; Notable for the following:    Glucose, Bld 129 (*)    BUN 97 (*)    Creatinine, Ser 4.00 (*)    GFR calc non Af Amer 13 (*)    GFR calc Af Amer 15 (*)    All other components within normal limits  PROTIME-INR - Abnormal; Notable for the following:    Prothrombin Time 30.9 (*)    INR 2.97 (*)    All other components within normal limits  URINE MICROSCOPIC-ADD ON - Abnormal; Notable for the following:    Squamous Epithelial / LPF FEW (*)    All other components within normal limits    Imaging Review No results found.   EKG Interpretation None      MDM   Final diagnoses:  None    1. Urinary retention, resolved 2. Hematuria 3. Coagulopathy  Cr elevated to 4.0 with documented history of abnormal renal function as high as 3 in the past. He has regular follow up with home health and catheter is draining now without complication. Return precautions given. Otherwise, f/u encouraged with PCP Monday (in 2 days) for recheck.     Dewaine Oats, PA-C 12/12/13 1729

## 2013-12-12 NOTE — ED Notes (Signed)
Called to pt room by family member.  Pt in severe pain and states his foley catheter is "blocked".  States home health nurse changed cath this am, clotted then attempted to irrigate without results.  Foley cath d/c'd with balloon intact and 68F reinserted.  Pt had immediate relief after initial cath removed.

## 2013-12-12 NOTE — ED Notes (Signed)
Pt reports his catheter was changed out by a home health nurse this morning. He had no uop, so she came back and attempted to flush several times. Pt states he also tried to flush it and was unsuccessful. Blood clots noted in cath tubing. Pt declines to have this rn attempt to flush cath, states "let's wait and see what the doctor wants to do.Marland KitchenMarland Kitchen"

## 2013-12-12 NOTE — Discharge Instructions (Signed)
Chronic Kidney Disease °Chronic kidney disease occurs when the kidneys are damaged over a long period. The kidneys are two organs that lie on either side of the spine between the middle of the back and the front of the abdomen. The kidneys:  °· Remove wastes and extra water from the blood.   °· Produce important hormones. These help keep bones strong, regulate blood pressure, and help create red blood cells.   °· Balance the fluids and chemicals in the blood and tissues. °A small amount of kidney damage may not cause problems, but a large amount of damage may make it difficult or impossible for the kidneys to work the way they should. If steps are not taken to slow down the kidney damage or stop it from getting worse, the kidneys may stop working permanently. Most of the time, chronic kidney disease does not go away. However, it can often be controlled, and those with the disease can usually live normal lives. °CAUSES  °The most common causes of chronic kidney disease are diabetes and high blood pressure (hypertension). Chronic kidney disease may also be caused by:  °· Diseases that cause the kidneys' filters to become inflamed.   °· Diseases that affect the immune system.   °· Genetic diseases.   °· Medicines that damage the kidneys, such as anti-inflammatory medicines.   °· Poisoning or exposure to toxic substances.   °· A reoccurring kidney or urinary infection.   °· A problem with urine flow. This may be caused by:   °¨ Cancer.   °¨ Kidney stones.   °¨ An enlarged prostate in males. °SIGNS AND SYMPTOMS  °Because the kidney damage in chronic kidney disease occurs slowly, symptoms develop slowly and may not be obvious until the kidney damage becomes severe. A person may have a kidney disease for years without showing any symptoms. Symptoms can include:  °· Swelling (edema) of the legs, ankles, or feet.   °· Tiredness (lethargy).   °· Nausea or vomiting.   °· Confusion.   °· Problems with urination, such as:    °¨ Decreased urine production.   °¨ Frequent urination, especially at night.   °¨ Frequent accidents in children who are potty trained.   °· Muscle twitches and cramps.   °· Shortness of breath.  °· Weakness.   °· Persistent itchiness.   °· Loss of appetite. °· Metallic taste in the mouth. °· Trouble sleeping. °· Slowed development in children. °· Short stature in children. °DIAGNOSIS  °Chronic kidney disease may be detected and diagnosed by tests, including blood, urine, imaging, or kidney biopsy tests.  °TREATMENT  °Most chronic kidney diseases cannot be cured. Treatment usually involves relieving symptoms and preventing or slowing the progression of the disease. Treatment may include:  °· A special diet. You may need to avoid alcohol and foods that are salty and high in potassium.   °· Medicines. These may:   °¨ Lower blood pressure.   °¨ Relieve anemia.   °¨ Relieve swelling.   °¨ Protect the bones. °HOME CARE INSTRUCTIONS  °· Follow your prescribed diet.   °· Take medicines only as directed by your health care provider. Do not take any new medicines (prescription, over-the-counter, or nutritional supplements) unless approved by your health care provider. Many medicines can worsen your kidney damage or need to have the dose adjusted.   °· Quit smoking if you smoke. Talk to your health care provider about a smoking cessation program.   °· Keep all follow-up visits as directed by your health care provider. °SEEK IMMEDIATE MEDICAL CARE IF: °· Your symptoms get worse or you develop new symptoms.   °· You develop symptoms of end-stage kidney disease. These   include:   Headaches.   Abnormally dark or light skin.   Numbness in the hands or feet.   Easy bruising.   Frequent hiccups.   Menstruation stops.   You have a fever.   You have decreased urine production.   You havepain or bleeding when urinating. MAKE SURE YOU:  Understand these instructions.  Will watch your condition.  Will  get help right away if you are not doing well or get worse. FOR MORE INFORMATION   American Association of Kidney Patients: BombTimer.gl  National Kidney Foundation: www.kidney.[place]: https://mathis.com/  [facility]: www.lifeoptions.org and www.kidneyschool.org Document Released: 11/23/2007 Document Revised: 06/30/2013 Document Reviewed: 10/13/2011 [facility] Patient Information 2015 [place], [place]. This information is not intended to replace advice given to you by your health care provider. Make sure you discuss any questions you have with your health care provider. Acute Urinary Retention Acute urinary retention is the temporary inability to urinate. This is a common problem in older men. As men age their prostates become larger and block the flow of urine from the bladder. This is usually a problem that has come on gradually.  HOME CARE INSTRUCTIONS If you are sent home with a Foley catheter and a drainage system, you will need to discuss the best course of action with your health care provider. While the catheter is in, maintain a good intake of fluids. Keep the drainage bag emptied and lower than your catheter. This is so that contaminated urine will not flow back into your bladder, which could lead to a urinary tract infection. There are two main types of drainage bags. One is a large bag that usually is used at night. It has a good capacity that will allow you to sleep through the night without having to empty it. The second type is called a leg bag. It has a smaller capacity, so it needs to be emptied more frequently. However, the main advantage is that it can be attached by a leg strap and can go underneath your clothing, allowing you the freedom to move about or leave your home. Only take over-the-counter or prescription medicines for pain, discomfort, or fever as directed by your health care provider.  SEEK MEDICAL CARE IF:  You develop a  low-grade fever.  You experience spasms or leakage of urine with the spasms. SEEK IMMEDIATE MEDICAL CARE IF:   You develop chills or fever.  Your catheter stops draining urine.  Your catheter falls out.  You start to develop increased bleeding that does not respond to rest and increased fluid intake. MAKE SURE YOU:  Understand these instructions.  Will watch your condition.  Will get help right away if you are not doing well or get worse. Document Released: 05/22/2000 Document Revised: 02/18/2013 Document Reviewed: 07/25/2012 [facility] Patient Information 2015 [place], [place]. This information is not intended to replace advice given to you by your health care provider. Make sure you discuss any questions you have with your health care provider.

## 2013-12-12 NOTE — ED Notes (Signed)
EDP and RN updated on pt condition.

## 2013-12-12 NOTE — ED Notes (Signed)
Pt has a foley catheter that was replaced at 9:30 this morning and family believes it is clogged.

## 2013-12-12 NOTE — ED Notes (Signed)
MD at bedside. 

## 2013-12-13 NOTE — ED Provider Notes (Signed)
Medical screening examination/treatment/procedure(s) were performed by non-physician practitioner and as supervising physician I was immediately available for consultation/collaboration.   EKG Interpretation None        Pamella Pert, MD 12/13/13 1149

## 2013-12-15 ENCOUNTER — Telehealth: Payer: Self-pay | Admitting: Family Medicine

## 2013-12-15 NOTE — Telephone Encounter (Signed)
Patient states drinking lots of fluids, gatorade, resting, staying home and keeping warm. He is starting to feel better today, just wanted to be sure that he was doing the right thing. Advised to continue what he is doing and to let us know if he does not keep improving.

## 2013-12-15 NOTE — Telephone Encounter (Signed)
Caller name: Zoey  Call back number: [phone]   Reason for call:  Pt and wife ( 2.20.1932 [place]; see's Dr. Birdie Riddle) both are sick.  Chills, headaches, diarrhea, nausea.  Refuse to come in due to transportation.  Want advice on what to eat and drink.

## 2013-12-19 ENCOUNTER — Telehealth: Payer: Self-pay | Admitting: *Deleted

## 2013-12-19 ENCOUNTER — Telehealth: Payer: Self-pay | Admitting: Family Medicine

## 2013-12-19 DIAGNOSIS — N390 Urinary tract infection, site not specified: Secondary | ICD-10-CM

## 2013-12-19 DIAGNOSIS — T83511A Infection and inflammatory reaction due to indwelling urethral catheter, initial encounter: Principal | ICD-10-CM

## 2013-12-19 NOTE — Telephone Encounter (Signed)
Caller name: janet Relation to pt: wife Call back number: [phone] Pharmacy:  Reason for call:   Patient wife states that patient recently had a catheter change and now she thinks he has uti. Patient does not have any transportation and would like [place] to send an order to [facility] for them to go to patient home

## 2013-12-19 NOTE — Telephone Encounter (Signed)
Received home health certification and plan of care via fax from [facility]. Forms forwarded to provider. JG//CMA

## 2013-12-19 NOTE — Telephone Encounter (Signed)
Please advise if it is ok to send an order for a UA and Urine culture.      KP

## 2013-12-19 NOTE — Telephone Encounter (Signed)
Yes, please do . If fever, chills needs to be seen (UC)

## 2013-12-22 DIAGNOSIS — I5022 Chronic systolic (congestive) heart failure: Secondary | ICD-10-CM

## 2013-12-22 DIAGNOSIS — N319 Neuromuscular dysfunction of bladder, unspecified: Secondary | ICD-10-CM

## 2013-12-22 DIAGNOSIS — I4891 Unspecified atrial fibrillation: Secondary | ICD-10-CM

## 2013-12-22 DIAGNOSIS — Z466 Encounter for fitting and adjustment of urinary device: Secondary | ICD-10-CM

## 2013-12-22 NOTE — Telephone Encounter (Signed)
Signed forms faxed to [facility]. JG//CMA

## 2013-12-22 NOTE — Telephone Encounter (Signed)
Verbal was left on Gary [name] VM and order faxed to [employer].     KP

## 2013-12-26 ENCOUNTER — Telehealth: Payer: Self-pay | Admitting: Family Medicine

## 2013-12-26 NOTE — Telephone Encounter (Signed)
Caller name: Alissa Relation to pt: [facility] Call back number: [phone]   Reason for call:  Calling with PT/INR results : 4.3 ;  Pt has been taking pepto a couple of times a day.  Was advised to NOT TAKE THIS!

## 2013-12-26 NOTE — Telephone Encounter (Signed)
Discussed with patient and a detailed message left on the VM for the [facility] nurse with the below recommendations.       KP

## 2013-12-26 NOTE — Telephone Encounter (Signed)
If he was taking 2.5 mg daily and 5 on Friday-----  Hold today and tomorrow -- start 2.5 daily-----  Recheck Monday If any bleeding from anywhere go to  ER

## 2013-12-26 NOTE — Telephone Encounter (Signed)
12/05/2013 was INR today 3.1. Still holding coumadin. Patient was to restart Coumadin 2.5 mg po daily starting today, then next week it will be 2.5 mg daily except 5 mg on Friday. Recheck INR next week   Please advise     KP

## 2013-12-29 ENCOUNTER — Telehealth: Payer: Self-pay

## 2013-12-29 NOTE — Telephone Encounter (Signed)
con't 2.5 daily and recheck Monday

## 2013-12-29 NOTE — Telephone Encounter (Signed)
[facility] called with INR result of 2.4. Please advise if changes needed.

## 2013-12-30 NOTE — Telephone Encounter (Signed)
Left voice mail for [employer] with new instructions and to CB if any questions.  [phone]

## 2014-01-05 ENCOUNTER — Telehealth: Payer: Self-pay

## 2014-01-05 NOTE — Telephone Encounter (Signed)
Gary [name] ( [phone]) with [facility] called. Patient has an INR of 4.9 today. Patient states that he has had no dietary changes or medication changes.  Please Advise.

## 2014-01-05 NOTE — Telephone Encounter (Signed)
Advised [facility] who will contact the patient. Patient is taking 2.5mg  daily.

## 2014-01-05 NOTE — Telephone Encounter (Signed)
Hold today Recheck tomorrow What is he taking?

## 2014-01-06 ENCOUNTER — Telehealth: Payer: Self-pay

## 2014-01-06 NOTE — Telephone Encounter (Signed)
noted 

## 2014-01-06 NOTE — Telephone Encounter (Signed)
Call from [facility] and she stated the patient's INR level was 4.2 and No symtpoms. Dr.Lowne advised to Hold again today and recheck tomorrow, Dr.Lowne requested to have it drawn from the arm and well as finger stick. I gave Alesa the verbal order and she agreed to do so and will call tomorrow with the results.      KP

## 2014-01-07 LAB — PROTIME-INR

## 2014-01-08 ENCOUNTER — Other Ambulatory Visit: Payer: Self-pay

## 2014-01-08 ENCOUNTER — Telehealth: Payer: Self-pay | Admitting: Family Medicine

## 2014-01-08 MED ORDER — ONDANSETRON HCL 8 MG PO TABS: ORAL_TABLET | ORAL | Status: DC

## 2014-01-08 NOTE — Telephone Encounter (Signed)
Caller name: alyssa with [facility] Relation to pt: Call back number: [phone] Pharmacy:  Reason for call:   [name] calling in with INR results 2.5

## 2014-01-08 NOTE — Telephone Encounter (Signed)
Great!  Stick with schedule Gary [name] called with today--- recheck Monday

## 2014-01-08 NOTE — Telephone Encounter (Signed)
Received results of PT/INR that was drawn on 01/07/2014. INR was 3.51 and PT was 35.2.

## 2014-01-08 NOTE — Telephone Encounter (Signed)
See faxed lab

## 2014-01-08 NOTE — Telephone Encounter (Signed)
Please advise on what the patient needs to be taking between now and monday       KP

## 2014-01-09 NOTE — Telephone Encounter (Signed)
Janett Billow please review and advise.      KP

## 2014-01-09 NOTE — Telephone Encounter (Signed)
1/2 of 2.5mg  Wednesday and Friday and 2.5 mg all other days. Form was faxed to [facility]. JG//CMA

## 2014-01-09 NOTE — Telephone Encounter (Signed)
[facility] called to verify the below orders. They will call patient and convey.

## 2014-01-09 NOTE — Telephone Encounter (Signed)
Gary [name] had it--- I think it was 1/2 2.5 tab wed , fri and 2.5 all other days-- recheck Monday.

## 2014-01-12 LAB — PROTIME-INR: INR: 1.9 — AB (ref 0.9–1.1)

## 2014-01-12 NOTE — Telephone Encounter (Signed)
Results of PT was 21.4 and INR was 1.86 drawn on 01/12/2014. Please advise.

## 2014-01-12 NOTE — Telephone Encounter (Signed)
Detailed message left for [name] with directions and advised her to call with any questions.     KP

## 2014-01-12 NOTE — Telephone Encounter (Signed)
2.5 daily except 1 /2 2.5 on Wed--- recheck 1 week

## 2014-01-15 ENCOUNTER — Other Ambulatory Visit: Payer: Self-pay | Admitting: Family Medicine

## 2014-01-20 ENCOUNTER — Encounter: Payer: Self-pay | Admitting: Family Medicine

## 2014-01-21 ENCOUNTER — Other Ambulatory Visit: Payer: Self-pay | Admitting: *Deleted

## 2014-01-21 MED ORDER — ATORVASTATIN CALCIUM 20 MG PO TABS: 20.0000 mg | ORAL_TABLET | Freq: Every day | ORAL | Status: DC

## 2014-01-29 ENCOUNTER — Telehealth: Payer: Self-pay

## 2014-01-29 NOTE — Telephone Encounter (Signed)
Nurse notified and Dr. Nonda Lou recommendations reviewed.  Nurse stated understand and said she would communicate the same to the patient.

## 2014-01-29 NOTE — Telephone Encounter (Signed)
We can lower the dose a few days before he goes to ER but it is not a good idea to wait 2 weeks.   He should take 5 mg mon and wed and 2.5 all other days --- recheck next week---- 4-5 days before change---he can lower dose

## 2014-01-29 NOTE — Telephone Encounter (Signed)
[facility]---[name] [id number]  01/29/14:  INR- 1.5 Taking-- Coumadin 2.5 every day.  No signs/symptoms of a clot. Next scheduled INR:  02/10/14  Pt is requesting that we not change Coumadin dosage until after he has his Foley changed on 02/12/14.  Pt is concerned that if dosage it changed that it will cause bleeding and clots.  Per nurse, pt expressed that the last time coumadin dosage was changed and nurse changed his Foley he ended up in the ER for bleeding and clots.     Please advise.

## 2014-02-05 ENCOUNTER — Telehealth: Payer: Self-pay

## 2014-02-05 NOTE — Telephone Encounter (Signed)
Spoke with [name] at [facility] and advised of recommendations. States understanding of plan.

## 2014-02-05 NOTE — Telephone Encounter (Signed)
That is fine--  con't current does

## 2014-02-05 NOTE — Telephone Encounter (Signed)
[facility]---[name] [phone]  INR Today 2.4 He missed his Sunday dose but did take the 5mg  on Monday. Patient would like to have it recheck on Tuesday 02/10/2014. He has his foley change on Thursday 02/12/2014 and he gets really anxious. Please advise.

## 2014-02-09 ENCOUNTER — Encounter: Payer: Self-pay | Admitting: Family Medicine

## 2014-02-11 ENCOUNTER — Emergency Department (HOSPITAL_COMMUNITY): Payer: Medicare Other

## 2014-02-11 ENCOUNTER — Emergency Department (HOSPITAL_COMMUNITY)
Admission: EM | Admit: 2014-02-11 | Discharge: 2014-02-11 | Disposition: A | Discharge status: Home / Self Care | Payer: Medicare Other | Attending: Emergency Medicine | Admitting: Emergency Medicine

## 2014-02-11 ENCOUNTER — Encounter (HOSPITAL_COMMUNITY): Payer: Self-pay | Admitting: Emergency Medicine

## 2014-02-11 DIAGNOSIS — Y9301 Activity, walking, marching and hiking: Secondary | ICD-10-CM | POA: Diagnosis not present

## 2014-02-11 DIAGNOSIS — K219 Gastro-esophageal reflux disease without esophagitis: Secondary | ICD-10-CM | POA: Diagnosis not present

## 2014-02-11 DIAGNOSIS — S0083XA Contusion of other part of head, initial encounter: Secondary | ICD-10-CM | POA: Insufficient documentation

## 2014-02-11 DIAGNOSIS — F419 Anxiety disorder, unspecified: Secondary | ICD-10-CM | POA: Diagnosis not present

## 2014-02-11 DIAGNOSIS — Z7982 Long term (current) use of aspirin: Secondary | ICD-10-CM | POA: Insufficient documentation

## 2014-02-11 DIAGNOSIS — Z79899 Other long term (current) drug therapy: Secondary | ICD-10-CM | POA: Diagnosis not present

## 2014-02-11 DIAGNOSIS — I5022 Chronic systolic (congestive) heart failure: Secondary | ICD-10-CM | POA: Diagnosis not present

## 2014-02-11 DIAGNOSIS — E785 Hyperlipidemia, unspecified: Secondary | ICD-10-CM | POA: Insufficient documentation

## 2014-02-11 DIAGNOSIS — S0990XA Unspecified injury of head, initial encounter: Secondary | ICD-10-CM

## 2014-02-11 DIAGNOSIS — S022XXA Fracture of nasal bones, initial encounter for closed fracture: Secondary | ICD-10-CM | POA: Diagnosis not present

## 2014-02-11 DIAGNOSIS — Z87891 Personal history of nicotine dependence: Secondary | ICD-10-CM | POA: Insufficient documentation

## 2014-02-11 DIAGNOSIS — I10 Essential (primary) hypertension: Secondary | ICD-10-CM | POA: Insufficient documentation

## 2014-02-11 DIAGNOSIS — W01198A Fall on same level from slipping, tripping and stumbling with subsequent striking against other object, initial encounter: Secondary | ICD-10-CM | POA: Insufficient documentation

## 2014-02-11 DIAGNOSIS — Y9289 Other specified places as the place of occurrence of the external cause: Secondary | ICD-10-CM | POA: Insufficient documentation

## 2014-02-11 DIAGNOSIS — Y998 Other external cause status: Secondary | ICD-10-CM | POA: Diagnosis not present

## 2014-02-11 DIAGNOSIS — D649 Anemia, unspecified: Secondary | ICD-10-CM | POA: Diagnosis not present

## 2014-02-11 DIAGNOSIS — Z880 Allergy status to penicillin: Secondary | ICD-10-CM | POA: Insufficient documentation

## 2014-02-11 DIAGNOSIS — Z7901 Long term (current) use of anticoagulants: Secondary | ICD-10-CM | POA: Diagnosis not present

## 2014-02-11 DIAGNOSIS — S0993XA Unspecified injury of face, initial encounter: Secondary | ICD-10-CM | POA: Diagnosis present

## 2014-02-11 MED ORDER — HYDROCODONE-ACETAMINOPHEN 5-325 MG PO TABS: 1.0000 | ORAL_TABLET | ORAL | Status: DC | PRN

## 2014-02-11 MED ORDER — HYDROCODONE-ACETAMINOPHEN 5-325 MG PO TABS
2.0000 | ORAL_TABLET | Freq: Once | ORAL | Status: AC
  Administered 2014-02-11: 2 via ORAL
  Filled 2014-02-11: qty 2

## 2014-02-11 MED ORDER — HYDROCODONE-ACETAMINOPHEN 5-325 MG PO TABS
1.0000 | ORAL_TABLET | ORAL | Status: DC | PRN
Start: 2014-02-11 — End: 2014-02-11

## 2014-02-11 NOTE — Discharge Instructions (Signed)
If you were given medicines take as directed.  If you are on coumadin or contraceptives realize their levels and effectiveness is altered by many different medicines.  If you have any reaction (rash, tongues swelling, other) to the medicines stop taking and see a physician.   Please follow up as directed and return to the ER or see a physician for new or worsening symptoms confusion/ passing out/ other.  Thank you. Filed Vitals:   02/11/14 1526 02/11/14 1530 02/11/14 1545 02/11/14 1600  BP:  107/62 94/55 121/60  Pulse: 60 52 62 62  Temp: 97.8 F (36.6 C)     TempSrc: Oral     Resp: 12 14 16 15   SpO2: 100% 100% 99% 100%

## 2014-02-11 NOTE — ED Notes (Signed)
MD at bedside. 

## 2014-02-11 NOTE — ED Notes (Addendum)
Per EMS, pt was at a gas station when he fell down 1 step (6 inches). Pt fell on his RT arm and on face. Pt has obvious deformity and bleeding from nose. Pt also has abrasion to RT arm. Bleeding controlled. Pt is on Coumadin daily. EMS gave pt 4 mg of Zofran PTA and was placed in C-Collar. Pt's top denture split in half. NAD noted. VSS. Airway patent.

## 2014-02-11 NOTE — ED Provider Notes (Signed)
CSN: [id number]     Arrival date & time 02/11/14  1509 History   First MD Initiated Contact with Patient 02/11/14 1511     Chief Complaint  Patient presents with  . Fall  . Facial Injury     (Consider location/radiation/quality/duration/timing/severity/associated sxs/prior Treatment) HPI Comments: 80 year old male with history of lipids, left bundle branch block, cardiomyopathy, atrial fibrillation, Coumadin use, CAD presents with facial injury and head pain since prior to arrival. Patient was walking and reading when he tripped on a curb causing him to fall on his face. Patient superficial abrasion right elbow. Patient denies other injury. Patient has swelling and bleeding to the nasal bone/nares and. Patient remembers having that happen, no syncope.  Patient is a 80 y.o. male presenting with fall and facial injury. The history is provided by the patient.  Fall Associated symptoms include headaches. Pertinent negatives include no chest pain, no abdominal pain and no shortness of breath.  Facial Injury Associated symptoms: headaches   Associated symptoms: no congestion, no neck pain and no vomiting     Past Medical History  Diagnosis Date  . Neurogenic bladder     has had bladder catheter changes at the urology office every 12 weeks  . Anemia   . Hyperlipidemia   . Hypertension   . Heart attack     1983  . Chronic systolic heart failure     a. prior EF 20-30%;  b. Echo 7/09: EF 35-40%, basal inferoposterior HK, mild AI, mild to moderate MR, moderate to marked LAE, mild to moderate TR, mildly increased PASP, moderate RAE;   c. Echo 01/2012:   mild LVH, EF 50%, Tr AI, mild MR, severe LAE, mod to severe RAE, mod to severe RVE, mod reduced RVSF, mod to severe TR, PASP 46  . Arrhythmia     afib  . Warfarin anticoagulation   . GERD (gastroesophageal reflux disease)   . Anxiety   . Renal failure    Past Surgical History  Procedure Laterality Date  . Appendectomy    .  Cholecystectomy      1971  . Coronary artery bypass graft      1995   Family History  Problem Relation Age of Onset  . Cancer Mother   . Heart attack Father     deceased at 66, smoking, ETOH abuse  . Breast cancer Sister    History  Substance Use Topics  . Smoking status: Former Research scientist (life sciences)  . Smokeless tobacco: Not on file  . Alcohol Use: No    Review of Systems  Constitutional: Negative for fever and chills.  HENT: Positive for facial swelling. Negative for congestion.   Eyes: Negative for visual disturbance.  Respiratory: Negative for shortness of breath.   Cardiovascular: Negative for chest pain.  Gastrointestinal: Negative for vomiting and abdominal pain.  Genitourinary: Negative for dysuria and flank pain.  Musculoskeletal: Negative for back pain, neck pain and neck stiffness.  Skin: Positive for wound. Negative for rash.  Neurological: Positive for headaches. Negative for light-headedness.      Allergies  Amoxicillin; Diltiazem hcl; Hydrocodone-acetaminophen; Niacin; Niacin-lovastatin er; Paroxetine; Propoxyphene n-acetaminophen; Pseudoephedrine; Sulfonamide derivatives; and Tolterodine tartrate  Home Medications   Prior to Admission medications   Medication Sig Start Date End Date Taking? Authorizing Provider  acetaminophen (TYLENOL) 500 MG tablet Take 1,000 mg by mouth 3 (three) times daily.    Historical Provider, MD  aspirin 81 MG tablet Take 81 mg by mouth 2 (two) times daily.  Historical Provider, MD  atorvastatin (LIPITOR) 20 MG tablet Take 1 tablet (20 mg total) by mouth daily. 01/21/14   [name], MD  carvedilol (COREG) 12.5 MG tablet TAKE 1 TABLET BY MOUTH TWICE DAILY WITH MEALS 11/04/13   Alferd Apa Lowne, DO  clonazePAM (KLONOPIN) 1 MG tablet TAKE 1 TABLET BY MOUTH TWICE DAILY AS NEEDED FOR ANXIETY 11/11/13   Alferd Apa Lowne, DO  enalapril (VASOTEC) 20 MG tablet Take 20 mg by mouth 2 (two) times daily.    Historical Provider, MD  enalapril (VASOTEC)  20 MG tablet TAKE 1 TABLET BY MOUTH TWICE DAILY    [name], DO  fenofibrate 54 MG tablet Take 54 mg by mouth 2 (two) times daily.    Historical Provider, MD  fenofibrate 54 MG tablet Take 1 tablet (54 mg total) by mouth 2 (two) times daily. Repeat labs are due now 12/03/13   Rosalita Chessman, DO  ferrous sulfate 325 (65 FE) MG tablet Take 325 mg by mouth daily with breakfast.     Historical Provider, MD  folic acid (FOLVITE) 1 MG tablet Take 1 mg by mouth daily.      Historical Provider, MD  furosemide (LASIX) 40 MG tablet One tablet and one half daily and AS NEEDED FOR WEIGHT GAIN OVER 195 09/26/13   [name], MD  Glucos-Chondroit-Hyaluron-MSM (GLUCOSAMINE CHONDROITIN JOINT PO) Take 1 tablet by mouth 2 (two) times daily.    Historical Provider, MD  HYDROcodone-acetaminophen (NORCO) 5-325 MG per tablet Take 1-2 tablets by mouth every 4 (four) hours as needed. 02/11/14   Mariea Clonts, MD  lidocaine (XYLOCAINE) 2 % jelly Place 1 application into the urethra daily as needed (pain).    Historical Provider, MD  Meth-Hyo-M Bl-Na Phos-Ph Sal (URIBEL) 118 MG CAPS Take 1-4 capsules (118-472 mg total) by mouth daily. 10/13/13   Rosalita Chessman, DO  Misc Natural Products (OSTEO BI-FLEX JOINT SHIELD PO) Take 650 mg by mouth 2 (two) times daily.    Historical Provider, MD  Multiple Vitamin (MULTIVITAMIN) capsule Take 1 capsule by mouth daily.      Historical Provider, MD  nystatin-triamcinolone ointment (MYCOLOG) APPLY TO THE AFFECTED AREA THREE TIME A DAY    [name], DO  ondansetron (ZOFRAN) 8 MG tablet TAKE 1 TABLET BY MOUTH EVERY 12 HOURS AS NEEDED FOR FOR NAUSEA 01/08/14   Alferd Apa Lowne, DO  triamcinolone cream (KENALOG) 0.1 % APPLY TO THE AFFECTED AREA TWICE DAILY AS NEEDED. DO NOT USE ON FACE, GROIN OR UNDERARMS    [name], DO  VITAMIN D, CHOLECALCIFEROL, PO Take 1 tablet by mouth daily.    Historical Provider, MD  warfarin (COUMADIN) 2.5 MG tablet Take 1 tablet daily except 1/2  on Wed 01/15/14   Rosalita Chessman, DO  warfarin (COUMADIN) 5 MG tablet TAKE 1 TABLET BY MOUTH EVERY DAY AS DIRECTED 12/08/13   Alferd Apa Lowne, DO   BP 121/60 mmHg  Pulse 62  Temp(Src) 97.8 F (36.6 C) (Oral)  Resp 15  SpO2 100% Physical Exam  Constitutional: He is oriented to person, place, and time. He appears well-developed and well-nourished.  HENT:  Head: Normocephalic.  Patient has mild bleeding left nare, dried blood. Patient superficial abrasion nasal bridge with moderate swelling. Patient has small amount of blood anterior incisor/gingiva, teeth intact. C-collar in place.  Eyes: Conjunctivae are normal. Right eye exhibits no discharge. Left eye exhibits no discharge.  Neck: Normal range of motion.  Neck supple. No tracheal deviation present.  Cardiovascular: Normal rate and regular rhythm.   Pulmonary/Chest: Effort normal and breath sounds normal.  Abdominal: Soft. He exhibits no distension. There is no tenderness. There is no guarding.  Musculoskeletal: He exhibits no edema.  Patient has good range of motion of hips knees ankles shoulders elbows, wrists without significant discomfort  Neurological: He is alert and oriented to person, place, and time. GCS eye subscore is 4. GCS verbal subscore is 5. GCS motor subscore is 6.  5+ strength in UE and LE with f/e at major joints. Sensation to palpation intact in UE and LE. CNs 2-12 grossly intact.  EOMFI.  PERRL.   Finger nose and coordination intact bilateral.   Visual fields intact to finger testing.   Skin: Skin is warm.  Patient has superficial skin tear/abrasion right lateral elbow, no effusion, full range of motion of elbow without bony tenderness.  Psychiatric: He has a normal mood and affect.  Nursing note and vitals reviewed.   ED Course  Procedures (including critical care time) Labs Review Labs Reviewed - No data to display  Imaging Review Ct Head Wo Contrast  02/11/2014   CLINICAL DATA:  [place] and hit face  EXAM:  CT HEAD WITHOUT CONTRAST  CT MAXILLOFACIAL WITHOUT CONTRAST  CT CERVICAL SPINE WITHOUT CONTRAST  TECHNIQUE: Multidetector CT imaging of the head, cervical spine, and maxillofacial structures were performed using the standard protocol without intravenous contrast. Multiplanar CT image reconstructions of the cervical spine and maxillofacial structures were also generated.  COMPARISON:  None.  FINDINGS: CT HEAD FINDINGS  Mild atrophy and mild chronic microvascular ischemic change in the white matter.  Negative for acute infarct.  Negative for hemorrhage or mass.  Negative for skull fracture.  See facial CT below.  CT MAXILLOFACIAL FINDINGS  Nondisplaced fractures of the inferior nasal spine and vomer. Probable nondisplaced fracture of the nasal septum. Probable nondisplaced fracture of the tip of the nasal bone. There is associated soft tissue swelling around the nose.  Negative for fracture of the orbit or mandible. No air-fluid level in the sinuses.  1 x 3 mm foreign body in the left frontal scalp likely a piece of glass. This may be acute or chronic. This is just above the orbit.  CT CERVICAL SPINE FINDINGS  Normal cervical alignment.  Negative for fracture or mass  Disc and facet degeneration throughout the cervical spine.  Carotid artery calcification bilaterally.  IMPRESSION: No acute intracranial abnormality.  Nondisplaced fractures of the inferior nasal spine and vomer. Probable nondisplaced fracture of the nasal septum. Probable fracture the nasal bone with posterior displacement.  1 x 3 mm glass foreign body in the left frontal scalp, this could be acute or chronic.  Negative for cervical spine fracture.   Electronically Signed   By: Franchot Gallo M.D.   On: 02/11/2014 16:50   Ct Cervical Spine Wo Contrast  02/11/2014   CLINICAL DATA:  [place] and hit face  EXAM: CT HEAD WITHOUT CONTRAST  CT MAXILLOFACIAL WITHOUT CONTRAST  CT CERVICAL SPINE WITHOUT CONTRAST  TECHNIQUE: Multidetector CT imaging of the head,  cervical spine, and maxillofacial structures were performed using the standard protocol without intravenous contrast. Multiplanar CT image reconstructions of the cervical spine and maxillofacial structures were also generated.  COMPARISON:  None.  FINDINGS: CT HEAD FINDINGS  Mild atrophy and mild chronic microvascular ischemic change in the white matter.  Negative for acute infarct.  Negative for hemorrhage or mass.  Negative for skull  fracture.  See facial CT below.  CT MAXILLOFACIAL FINDINGS  Nondisplaced fractures of the inferior nasal spine and vomer. Probable nondisplaced fracture of the nasal septum. Probable nondisplaced fracture of the tip of the nasal bone. There is associated soft tissue swelling around the nose.  Negative for fracture of the orbit or mandible. No air-fluid level in the sinuses.  1 x 3 mm foreign body in the left frontal scalp likely a piece of glass. This may be acute or chronic. This is just above the orbit.  CT CERVICAL SPINE FINDINGS  Normal cervical alignment.  Negative for fracture or mass  Disc and facet degeneration throughout the cervical spine.  Carotid artery calcification bilaterally.  IMPRESSION: No acute intracranial abnormality.  Nondisplaced fractures of the inferior nasal spine and vomer. Probable nondisplaced fracture of the nasal septum. Probable fracture the nasal bone with posterior displacement.  1 x 3 mm glass foreign body in the left frontal scalp, this could be acute or chronic.  Negative for cervical spine fracture.   Electronically Signed   By: Franchot Gallo M.D.   On: 02/11/2014 16:50   Ct Maxillofacial Wo Cm  02/11/2014   CLINICAL DATA:  [place] and hit face  EXAM: CT HEAD WITHOUT CONTRAST  CT MAXILLOFACIAL WITHOUT CONTRAST  CT CERVICAL SPINE WITHOUT CONTRAST  TECHNIQUE: Multidetector CT imaging of the head, cervical spine, and maxillofacial structures were performed using the standard protocol without intravenous contrast. Multiplanar CT image  reconstructions of the cervical spine and maxillofacial structures were also generated.  COMPARISON:  None.  FINDINGS: CT HEAD FINDINGS  Mild atrophy and mild chronic microvascular ischemic change in the white matter.  Negative for acute infarct.  Negative for hemorrhage or mass.  Negative for skull fracture.  See facial CT below.  CT MAXILLOFACIAL FINDINGS  Nondisplaced fractures of the inferior nasal spine and vomer. Probable nondisplaced fracture of the nasal septum. Probable nondisplaced fracture of the tip of the nasal bone. There is associated soft tissue swelling around the nose.  Negative for fracture of the orbit or mandible. No air-fluid level in the sinuses.  1 x 3 mm foreign body in the left frontal scalp likely a piece of glass. This may be acute or chronic. This is just above the orbit.  CT CERVICAL SPINE FINDINGS  Normal cervical alignment.  Negative for fracture or mass  Disc and facet degeneration throughout the cervical spine.  Carotid artery calcification bilaterally.  IMPRESSION: No acute intracranial abnormality.  Nondisplaced fractures of the inferior nasal spine and vomer. Probable nondisplaced fracture of the nasal septum. Probable fracture the nasal bone with posterior displacement.  1 x 3 mm glass foreign body in the left frontal scalp, this could be acute or chronic.  Negative for cervical spine fracture.   Electronically Signed   By: Franchot Gallo M.D.   On: 02/11/2014 16:50     EKG Interpretation   Date/Time:  Wednesday February 11 2014 15:28:30 EST Ventricular Rate:  60 PR Interval:    QRS Duration: 196 QT Interval:  609 QTC Calculation: 609 R Axis:   -73 Text Interpretation:  h block Confirmed by Coltan Spinello  MD, Tynia Wiers ([id number]) on  02/11/2014 3:55:15 PM      MDM   Final diagnoses:  Acute head injury  Facial contusion, initial encounter  Nasal bone fracture, closed, initial encounter   Patient on Coumadin presents with mechanical fall. Jorde of injury to nasal  bridge/facial bones. Plan for CT head neck and facial bones, pain meds  ordered.  CT results reviewed no acute intracranial bleeding, nasal bone fracture without significant displacement. No hematoma on exam, significant swelling. Discussed ice, pain meds and close follow-up with ENT specialist.  Results and differential diagnosis were discussed with the patient/parent/guardian. Close follow up outpatient was discussed, comfortable with the plan.   Medications  HYDROcodone-acetaminophen (NORCO/VICODIN) 5-325 MG per tablet 2 tablet (2 tablets Oral Given 02/11/14 1548)    Filed Vitals:   02/11/14 1526 02/11/14 1530 02/11/14 1545 02/11/14 1600  BP:  107/62 94/55 121/60  Pulse: 60 52 62 62  Temp: 97.8 F (36.6 C)     TempSrc: Oral     Resp: 12 14 16 15   SpO2: 100% 100% 99% 100%    Final diagnoses:  Acute head injury  Facial contusion, initial encounter  Nasal bone fracture, closed, initial encounter        Mariea Clonts, MD 02/11/14 [phone]

## 2014-02-11 NOTE — ED Notes (Signed)
MD Zavitz at bedside  

## 2014-02-15 ENCOUNTER — Other Ambulatory Visit: Payer: Self-pay | Admitting: Cardiology

## 2014-02-15 ENCOUNTER — Other Ambulatory Visit: Payer: Self-pay | Admitting: Family Medicine

## 2014-02-26 ENCOUNTER — Telehealth: Payer: Self-pay | Admitting: Family Medicine

## 2014-02-26 NOTE — Telephone Encounter (Signed)
Caller name: rorke Relation to pt: self Call back number:  [phone] [facility] P: [phone]  Reason for call:   Patient requesting that a 90 day supply with refills of all medications.

## 2014-03-02 ENCOUNTER — Other Ambulatory Visit: Payer: Self-pay | Admitting: Family Medicine

## 2014-03-02 MED ORDER — FENOFIBRATE 54 MG PO TABS: 54.0000 mg | ORAL_TABLET | Freq: Two times a day (BID) | ORAL | Status: DC

## 2014-03-02 MED ORDER — CARVEDILOL 12.5 MG PO TABS: 12.5000 mg | ORAL_TABLET | Freq: Two times a day (BID) | ORAL | Status: DC

## 2014-03-02 MED ORDER — ENALAPRIL MALEATE 20 MG PO TABS: 20.0000 mg | ORAL_TABLET | Freq: Two times a day (BID) | ORAL | Status: DC

## 2014-03-02 NOTE — Telephone Encounter (Signed)
Med's faxed to [phone] as requested.      KP

## 2014-03-03 ENCOUNTER — Other Ambulatory Visit: Payer: Self-pay | Admitting: Family Medicine

## 2014-03-03 ENCOUNTER — Other Ambulatory Visit: Payer: Self-pay

## 2014-03-03 DIAGNOSIS — S0081XD Abrasion of other part of head, subsequent encounter: Secondary | ICD-10-CM

## 2014-03-03 DIAGNOSIS — N319 Neuromuscular dysfunction of bladder, unspecified: Secondary | ICD-10-CM

## 2014-03-03 DIAGNOSIS — S022XXD Fracture of nasal bones, subsequent encounter for fracture with routine healing: Secondary | ICD-10-CM

## 2014-03-03 DIAGNOSIS — Z466 Encounter for fitting and adjustment of urinary device: Secondary | ICD-10-CM

## 2014-03-03 MED ORDER — CARVEDILOL 12.5 MG PO TABS: 12.5000 mg | ORAL_TABLET | Freq: Two times a day (BID) | ORAL | Status: DC

## 2014-03-03 MED ORDER — FENOFIBRATE 54 MG PO TABS: 54.0000 mg | ORAL_TABLET | Freq: Two times a day (BID) | ORAL | Status: DC

## 2014-03-03 MED ORDER — ENALAPRIL MALEATE 20 MG PO TABS: 20.0000 mg | ORAL_TABLET | Freq: Two times a day (BID) | ORAL | Status: DC

## 2014-03-03 NOTE — Telephone Encounter (Signed)
Last seen 06/27/13 and filled 11/11/13 #60 No UDS    Please advise     KP

## 2014-03-04 ENCOUNTER — Telehealth: Payer: Self-pay

## 2014-03-04 NOTE — Telephone Encounter (Signed)
agree

## 2014-03-04 NOTE — Telephone Encounter (Signed)
[facility]---[name] [phone]  Called to report INR:  2.8   Pt taking Coumadin 2.5 mg daily.  No sign/symptoms of bleeding.  No medication changes.    Recommendation given:  Continue Coumadin 2.5 mg daily.  Repeat INR in 2 weeks.

## 2014-03-05 NOTE — Telephone Encounter (Signed)
Rx phoned in.      KP

## 2014-03-05 NOTE — Telephone Encounter (Signed)
Patient states that pharmacy has not received this.   Pharmacy line, phone # [phone] and fax# [phone].

## 2014-03-07 ENCOUNTER — Other Ambulatory Visit: Payer: Self-pay | Admitting: Family Medicine

## 2014-03-09 NOTE — Telephone Encounter (Signed)
Pt request for Enalapril.  Rx previously sent 03/03/13 for 180 tabs.        EAL

## 2014-03-11 ENCOUNTER — Telehealth: Payer: Self-pay | Admitting: Family Medicine

## 2014-03-11 DIAGNOSIS — D649 Anemia, unspecified: Secondary | ICD-10-CM

## 2014-03-11 DIAGNOSIS — E785 Hyperlipidemia, unspecified: Secondary | ICD-10-CM

## 2014-03-11 DIAGNOSIS — I25119 Atherosclerotic heart disease of native coronary artery with unspecified angina pectoris: Secondary | ICD-10-CM

## 2014-03-11 DIAGNOSIS — I10 Essential (primary) hypertension: Secondary | ICD-10-CM

## 2014-03-11 NOTE — Telephone Encounter (Signed)
That is fine 

## 2014-03-11 NOTE — Telephone Encounter (Signed)
To Md to review and advise       KP

## 2014-03-11 NOTE — Telephone Encounter (Signed)
Caller name: haris Relation to pt: self Call back number: [phone] Pharmacy:  Reason for call:   Patient states that he is now homebound. He picked up his medication and rx states that "labs are due now". He was wanting to know if [facility] could draw labs. P: [phone] Alisha at [facility]  Patient states that his weight is now 185-190.

## 2014-03-12 MED ORDER — CARVEDILOL 12.5 MG PO TABS: 12.5000 mg | ORAL_TABLET | Freq: Two times a day (BID) | ORAL | Status: DC

## 2014-03-12 MED ORDER — ENALAPRIL MALEATE 20 MG PO TABS: 20.0000 mg | ORAL_TABLET | Freq: Two times a day (BID) | ORAL | Status: DC

## 2014-03-12 MED ORDER — FENOFIBRATE 54 MG PO TABS: 54.0000 mg | ORAL_TABLET | Freq: Two times a day (BID) | ORAL | Status: DC

## 2014-03-12 NOTE — Telephone Encounter (Signed)
Patient states that insurance will not cover for rx through [facility] mail delivery.   Prescriptions need to go to [facility] on [address]

## 2014-03-12 NOTE — Telephone Encounter (Signed)
Orders faxed to [facility].      KP

## 2014-03-12 NOTE — Telephone Encounter (Signed)
What would you like done?

## 2014-03-12 NOTE — Telephone Encounter (Signed)
Cad, htn, hyperlipidemia-, anemia--- lipid, hep, bmp, ua cbcd

## 2014-03-12 NOTE — Addendum Note (Signed)
Addended by: Ewing Schlein on: 03/12/2014 04:14 PM   Modules accepted: Orders

## 2014-03-12 NOTE — Telephone Encounter (Signed)
Med's sent.      KP 

## 2014-03-13 ENCOUNTER — Emergency Department (HOSPITAL_BASED_OUTPATIENT_CLINIC_OR_DEPARTMENT_OTHER)
Admission: EM | Admit: 2014-03-13 | Discharge: 2014-03-13 | Disposition: A | Discharge status: Home / Self Care | Payer: Medicare Other | Attending: Emergency Medicine | Admitting: Emergency Medicine

## 2014-03-13 ENCOUNTER — Telehealth: Payer: Self-pay | Admitting: Family Medicine

## 2014-03-13 ENCOUNTER — Encounter (HOSPITAL_BASED_OUTPATIENT_CLINIC_OR_DEPARTMENT_OTHER): Payer: Self-pay | Admitting: *Deleted

## 2014-03-13 DIAGNOSIS — D649 Anemia, unspecified: Secondary | ICD-10-CM | POA: Insufficient documentation

## 2014-03-13 DIAGNOSIS — E785 Hyperlipidemia, unspecified: Secondary | ICD-10-CM | POA: Diagnosis not present

## 2014-03-13 DIAGNOSIS — I252 Old myocardial infarction: Secondary | ICD-10-CM | POA: Diagnosis not present

## 2014-03-13 DIAGNOSIS — K219 Gastro-esophageal reflux disease without esophagitis: Secondary | ICD-10-CM | POA: Insufficient documentation

## 2014-03-13 DIAGNOSIS — I10 Essential (primary) hypertension: Secondary | ICD-10-CM | POA: Diagnosis not present

## 2014-03-13 DIAGNOSIS — F419 Anxiety disorder, unspecified: Secondary | ICD-10-CM | POA: Insufficient documentation

## 2014-03-13 DIAGNOSIS — Y846 Urinary catheterization as the cause of abnormal reaction of the patient, or of later complication, without mention of misadventure at the time of the procedure: Secondary | ICD-10-CM | POA: Diagnosis not present

## 2014-03-13 DIAGNOSIS — T83098A Other mechanical complication of other indwelling urethral catheter, initial encounter: Secondary | ICD-10-CM | POA: Diagnosis not present

## 2014-03-13 DIAGNOSIS — R339 Retention of urine, unspecified: Secondary | ICD-10-CM | POA: Diagnosis present

## 2014-03-13 DIAGNOSIS — Z7901 Long term (current) use of anticoagulants: Secondary | ICD-10-CM | POA: Diagnosis not present

## 2014-03-13 DIAGNOSIS — Z87448 Personal history of other diseases of urinary system: Secondary | ICD-10-CM | POA: Diagnosis not present

## 2014-03-13 DIAGNOSIS — I5022 Chronic systolic (congestive) heart failure: Secondary | ICD-10-CM | POA: Diagnosis not present

## 2014-03-13 DIAGNOSIS — Z880 Allergy status to penicillin: Secondary | ICD-10-CM | POA: Diagnosis not present

## 2014-03-13 DIAGNOSIS — T839XXS Unspecified complication of genitourinary prosthetic device, implant and graft, sequela: Secondary | ICD-10-CM

## 2014-03-13 LAB — URINALYSIS, ROUTINE W REFLEX MICROSCOPIC
Bilirubin Urine: NEGATIVE
Glucose, UA: NEGATIVE mg/dL
KETONES UR: NEGATIVE mg/dL
Nitrite: NEGATIVE
PROTEIN: 100 mg/dL — AB
Specific Gravity, Urine: 1.011 (ref 1.005–1.030)
UROBILINOGEN UA: 0.2 mg/dL (ref 0.0–1.0)
pH: 7 (ref 5.0–8.0)

## 2014-03-13 LAB — URINE MICROSCOPIC-ADD ON

## 2014-03-13 MED ORDER — LEVOFLOXACIN 500 MG PO TABS: 500.0000 mg | ORAL_TABLET | Freq: Every day | ORAL | Status: DC

## 2014-03-13 MED ORDER — LEVOFLOXACIN 500 MG PO TABS
500.0000 mg | ORAL_TABLET | Freq: Once | ORAL | Status: AC
  Administered 2014-03-13: 500 mg via ORAL
  Filled 2014-03-13: qty 1

## 2014-03-13 NOTE — ED Notes (Signed)
Pt arrived w c/o abd pain  Foley in placed has been replaced x 3 today w not output in bag

## 2014-03-13 NOTE — ED Notes (Signed)
Per Pt. The Cath has been changed x 3 on Thurs. And irrigated by home health agency.  Pt. Has noted blood in tubing of catheter and blood in bag of catheter.  Pt. Reports pain 10/10.

## 2014-03-13 NOTE — Telephone Encounter (Signed)
Error/gd °

## 2014-03-13 NOTE — ED Provider Notes (Signed)
CSN: [id number]     Arrival date & time 03/13/14  0048 History   First MD Initiated Contact with Patient 03/13/14 0132     Chief Complaint  Patient presents with  . Urinary Retention     (Consider location/radiation/quality/duration/timing/severity/associated sxs/prior Treatment) HPI The patient has in-home nursing care. They had difficulty getting a catheter placed today. He reports that the catheter was changed 3 times and attempts were made to irrigate by the home health agency but they were unable to get urine to drain from the new catheter. He reports that he has been unable to empty his bladder and he has severe suprapubic pressure and pain. The patient reports he has chronic bladder problems and gets bleeding with catheter changes. He denies other acute problems. The patient has multiple health conditions but has not been suffering from fever, nausea vomiting or flank pain. Past Medical History  Diagnosis Date  . Neurogenic bladder     has had bladder catheter changes at the urology office every 12 weeks  . Anemia   . Hyperlipidemia   . Hypertension   . Heart attack     1983  . Chronic systolic heart failure     a. prior EF 20-30%;  b. Echo 7/09: EF 35-40%, basal inferoposterior HK, mild AI, mild to moderate MR, moderate to marked LAE, mild to moderate TR, mildly increased PASP, moderate RAE;   c. Echo 01/2012:   mild LVH, EF 50%, Tr AI, mild MR, severe LAE, mod to severe RAE, mod to severe RVE, mod reduced RVSF, mod to severe TR, PASP 46  . Arrhythmia     afib  . Warfarin anticoagulation   . GERD (gastroesophageal reflux disease)   . Anxiety   . Renal failure    Past Surgical History  Procedure Laterality Date  . Appendectomy    . Cholecystectomy      1971  . Coronary artery bypass graft      1995   Family History  Problem Relation Age of Onset  . Cancer Mother   . Heart attack Father     deceased at 7, smoking, ETOH abuse  . Breast cancer Sister    History   Substance Use Topics  . Smoking status: Former Research scientist (life sciences)  . Smokeless tobacco: Not on file  . Alcohol Use: No    Review of Systems  10 Systems reviewed and are negative for acute change except as noted in the HPI.   Allergies  Amoxicillin; Diltiazem hcl; Hydrocodone-acetaminophen; Niacin; Niacin-lovastatin er; Paroxetine; Propoxyphene n-acetaminophen; Pseudoephedrine; Sulfonamide derivatives; and Tolterodine tartrate  Home Medications   Prior to Admission medications   Medication Sig Start Date End Date Taking? Authorizing Provider  acetaminophen (TYLENOL) 500 MG tablet Take 1,000 mg by mouth 3 (three) times daily.    Historical Provider, MD  aspirin 81 MG tablet Take 81 mg by mouth 2 (two) times daily.     Historical Provider, MD  atorvastatin (LIPITOR) 20 MG tablet TAKE 1 TABLET BY MOUTH DAILY 02/16/14   [name], MD  carvedilol (COREG) 12.5 MG tablet Take 1 tablet (12.5 mg total) by mouth 2 (two) times daily with a meal. 03/12/14   Rosalita Chessman, DO  clonazePAM (KLONOPIN) 1 MG tablet TAKE 1 TABLET BY MOUTH TWICE DAILY AS NEEDED FOR ANXIETY 03/03/14   Alferd Apa Lowne, DO  enalapril (VASOTEC) 20 MG tablet Take 1 tablet (20 mg total) by mouth 2 (two) times daily. 03/03/14   Rosalita Chessman, DO  enalapril (VASOTEC) 20 MG tablet Take 1 tablet (20 mg total) by mouth 2 (two) times daily. 03/12/14   Rosalita Chessman, DO  fenofibrate 54 MG tablet Take 1 tablet (54 mg total) by mouth 2 (two) times daily. Repeat labs are due now 03/03/14   Rosalita Chessman, DO  fenofibrate 54 MG tablet Take 1 tablet (54 mg total) by mouth 2 (two) times daily. 03/12/14   Rosalita Chessman, DO  ferrous sulfate 325 (65 FE) MG tablet Take 325 mg by mouth daily with breakfast.     Historical Provider, MD  folic acid (FOLVITE) 1 MG tablet Take 1 mg by mouth daily.      Historical Provider, MD  furosemide (LASIX) 40 MG tablet One tablet and one half daily and AS NEEDED FOR WEIGHT GAIN OVER 195 09/26/13   [name], MD   Glucos-Chondroit-Hyaluron-MSM (GLUCOSAMINE CHONDROITIN JOINT PO) Take 1 tablet by mouth 2 (two) times daily.    Historical Provider, MD  HYDROcodone-acetaminophen (NORCO) 5-325 MG per tablet Take 1-2 tablets by mouth every 4 (four) hours as needed. 02/11/14   Ephraim Hamburger, MD  levofloxacin (LEVAQUIN) 500 MG tablet Take 1 tablet (500 mg total) by mouth daily. 03/13/14   Charlesetta Shanks, MD  lidocaine (XYLOCAINE) 2 % jelly Place 1 application into the urethra daily as needed (pain).    Historical Provider, MD  Meth-Hyo-M Bl-Na Phos-Ph Sal (URIBEL) 118 MG CAPS Take 1-4 capsules (118-472 mg total) by mouth daily. 10/13/13   Rosalita Chessman, DO  Misc Natural Products (OSTEO BI-FLEX JOINT SHIELD PO) Take 650 mg by mouth 2 (two) times daily.    Historical Provider, MD  Multiple Vitamin (MULTIVITAMIN) capsule Take 1 capsule by mouth daily.      Historical Provider, MD  nystatin-triamcinolone ointment (MYCOLOG) APPLY TO THE AFFECTED AREA THREE TIME A DAY    [name], DO  ondansetron (ZOFRAN) 8 MG tablet TAKE 1 TABLET BY MOUTH EVERY 12 HOURS AS NEEDED FOR NAUSEA 02/16/14   Alferd Apa Lowne, DO  triamcinolone cream (KENALOG) 0.1 % APPLY TO THE AFFECTED AREA TWICE DAILY AS NEEDED. DO NOT USE ON FACE, GROIN OR UNDERARMS    [name], DO  VITAMIN D, CHOLECALCIFEROL, PO Take 1 tablet by mouth daily.    Historical Provider, MD  warfarin (COUMADIN) 2.5 MG tablet Take 1 tablet daily except 1/2 on Wed 01/15/14   Rosalita Chessman, DO  warfarin (COUMADIN) 5 MG tablet TAKE 1 TABLET BY MOUTH EVERY DAY AS DIRECTED 12/08/13   Alferd Apa Lowne, DO   BP 141/67 mmHg  Pulse 86  Temp(Src) 98.7 F (37.1 C)  Resp 17  Wt 190 lb (86.183 kg)  SpO2 98% Physical Exam  Constitutional: He is oriented to person, place, and time.  The patient is a elderly appearing gentleman who appears moderately debilitated. He is nontoxic and alert without acute respiratory distress.  HENT:  Head: Normocephalic and atraumatic.  Eyes: EOM  are normal.  Neck: Neck supple.  Cardiovascular: Normal rate, normal heart sounds and intact distal pulses.   Heart is irregularly irregular.  Pulmonary/Chest: Effort normal and breath sounds normal. No respiratory distress. He has no rales.  Patient is mildly diminished at the bases.  Abdominal: Soft. Bowel sounds are normal. He exhibits no distension. There is no tenderness.  Examination is done post-Foley catheter placement.  Musculoskeletal: Normal range of motion.  Neurological: He is alert and oriented to person, place, and time. He has normal  strength. Coordination normal. GCS eye subscore is 4. GCS verbal subscore is 5. GCS motor subscore is 6.  Skin: Skin is warm, dry and intact.  Patient has multiple senile ecchymoses on the arms.  Psychiatric: He has a normal mood and affect.    ED Course  Procedures (including critical care time) Labs Review Labs Reviewed  URINALYSIS, ROUTINE W REFLEX MICROSCOPIC - Abnormal; Notable for the following:    Hgb urine dipstick LARGE (*)    Protein, ur 100 (*)    Leukocytes, UA SMALL (*)    All other components within normal limits  URINE MICROSCOPIC-ADD ON - Abnormal; Notable for the following:    Bacteria, UA FEW (*)    All other components within normal limits  URINE CULTURE    Imaging Review No results found.   EKG Interpretation None      MDM   Final diagnoses:  Foley catheter problem, sequela   Nursing staff was able to place a new catheter and obtain good urine return. The urine was rusty in appearance. The patient has not had fever or chills. The urine does show white cells and leuk esterase present. At this point he will be given a dose of Levaquin and instructed to contact his family doctor to follow up on culture results to determine if ongoing antibiotics will be needed or if any changes are needed to the antibiotic regimen.    Charlesetta Shanks, MD 03/13/14 [phone]

## 2014-03-13 NOTE — Discharge Instructions (Signed)
YOUR DOCTOR WILL NEED TO GET THE RESULTS OF YOUR URINE CULTURE TO DETERMINE IF YOU NEED ONGOING ANTIBIOTICS FOR URINARY TRACT INFECTION. IT WILL ALSO BE NECESSARY TO MONITIR YOUR COUMADIN DOSING CLOSELY AS MANY MEDICATIONS CHANGE ITS ACTIVITY AND CAN PUT YOU AT INCREASED RISK OF BLEEDING.  Acute Urinary Retention Acute urinary retention is the temporary inability to urinate. This is a common problem in older men. As men age their prostates become larger and block the flow of urine from the bladder. This is usually a problem that has come on gradually.  HOME CARE INSTRUCTIONS If you are sent home with a Foley catheter and a drainage system, you will need to discuss the best course of action with your health care provider. While the catheter is in, maintain a good intake of fluids. Keep the drainage bag emptied and lower than your catheter. This is so that contaminated urine will not flow back into your bladder, which could lead to a urinary tract infection. There are two main types of drainage bags. One is a large bag that usually is used at night. It has a good capacity that will allow you to sleep through the night without having to empty it. The second type is called a leg bag. It has a smaller capacity, so it needs to be emptied more frequently. However, the main advantage is that it can be attached by a leg strap and can go underneath your clothing, allowing you the freedom to move about or leave your home. Only take over-the-counter or prescription medicines for pain, discomfort, or fever as directed by your health care provider.  SEEK MEDICAL CARE IF:  You develop a low-grade fever.  You experience spasms or leakage of urine with the spasms. SEEK IMMEDIATE MEDICAL CARE IF:   You develop chills or fever.  Your catheter stops draining urine.  Your catheter falls out.  You start to develop increased bleeding that does not respond to rest and increased fluid intake. MAKE SURE  YOU:  Understand these instructions.  Will watch your condition.  Will get help right away if you are not doing well or get worse. Document Released: 05/22/2000 Document Revised: 02/18/2013 Document Reviewed: 07/25/2012 [facility] Patient Information 2015 [place], [place]. This information is not intended to replace advice given to you by your health care provider. Make sure you discuss any questions you have with your health care provider. Urinary Tract Infection Urinary tract infections (UTIs) can develop anywhere along your urinary tract. Your urinary tract is your body's drainage system for removing wastes and extra water. Your urinary tract includes two kidneys, two ureters, a bladder, and a urethra. Your kidneys are a pair of bean-shaped organs. Each kidney is about the size of your fist. They are located below your ribs, one on each side of your spine. CAUSES Infections are caused by microbes, which are microscopic organisms, including fungi, viruses, and bacteria. These organisms are so small that they can only be seen through a microscope. Bacteria are the microbes that most commonly cause UTIs. SYMPTOMS  Symptoms of UTIs may vary by age and gender of the patient and by the location of the infection. Symptoms in young women typically include a frequent and intense urge to urinate and a painful, burning feeling in the bladder or urethra during urination. Older women and men are more likely to be tired, shaky, and weak and have muscle aches and abdominal pain. A fever may mean the infection is in your kidneys. Other symptoms of  a kidney infection include pain in your back or sides below the ribs, nausea, and vomiting. DIAGNOSIS To diagnose a UTI, your caregiver will ask you about your symptoms. Your caregiver also will ask to provide a urine sample. The urine sample will be tested for bacteria and white blood cells. White blood cells are made by your body to help fight infection. TREATMENT   Typically, UTIs can be treated with medication. Because most UTIs are caused by a bacterial infection, they usually can be treated with the use of antibiotics. The choice of antibiotic and length of treatment depend on your symptoms and the type of bacteria causing your infection. HOME CARE INSTRUCTIONS  If you were prescribed antibiotics, take them exactly as your caregiver instructs you. Finish the medication even if you feel better after you have only taken some of the medication.  Drink enough water and fluids to keep your urine clear or pale yellow.  Avoid caffeine, tea, and carbonated beverages. They tend to irritate your bladder.  Empty your bladder often. Avoid holding urine for long periods of time.  Empty your bladder before and after sexual intercourse.  After a bowel movement, women should cleanse from front to back. Use each tissue only once. SEEK MEDICAL CARE IF:   You have back pain.  You develop a fever.  Your symptoms do not begin to resolve within 3 days. SEEK IMMEDIATE MEDICAL CARE IF:   You have severe back pain or lower abdominal pain.  You develop chills.  You have nausea or vomiting.  You have continued burning or discomfort with urination. MAKE SURE YOU:   Understand these instructions.  Will watch your condition.  Will get help right away if you are not doing well or get worse. Document Released: 11/23/2004 Document Revised: 08/15/2011 Document Reviewed: 03/24/2011 [facility] Patient Information 2015 [place], [place]. This information is not intended to replace advice given to you by your health care provider. Make sure you discuss any questions you have with your health care provider.

## 2014-03-15 LAB — URINE CULTURE: Colony Count: >=100000

## 2014-03-16 ENCOUNTER — Telehealth (HOSPITAL_BASED_OUTPATIENT_CLINIC_OR_DEPARTMENT_OTHER): Payer: Self-pay | Admitting: Emergency Medicine

## 2014-03-16 NOTE — Telephone Encounter (Signed)
Post ED Visit - Positive Culture Follow-up  Culture report reviewed by antimicrobial stewardship pharmacist: []  Wes Warwick, Pharm.D., BCPS []  Heide Guile, Pharm.D., BCPS []  Alycia Rossetti, Pharm.D., BCPS []  [place], Pharm.D., BCPS, AAHIVP []  Legrand Como, Pharm.D., BCPS, AAHIVP [x]  Isac Sarna, Pharm.D., BCPS  Positive urine culture E. Coli Treated with levofloxacin, organism sensitive to the same and no further patient follow-up is required at this time.  Hazle Nordmann 03/16/2014, 4:00 PM

## 2014-03-18 ENCOUNTER — Other Ambulatory Visit: Payer: Self-pay | Admitting: Cardiology

## 2014-03-19 ENCOUNTER — Telehealth: Payer: Self-pay | Admitting: Family Medicine

## 2014-03-19 NOTE — Telephone Encounter (Signed)
Caller name:[name] Brown-[facility] Relation to [phone] Call back [place]:  Reason for call: calling to report INR 3.1 and pt is asking for meds to assist with sleep.

## 2014-03-19 NOTE — Telephone Encounter (Signed)
Please advise 

## 2014-03-19 NOTE — Telephone Encounter (Signed)
What is his coumadin dose-? trazadone 50 mg 1/2-1 po qhs prn #30

## 2014-03-23 MED ORDER — TRAZODONE HCL 50 MG PO TABS: 25.0000 mg | ORAL_TABLET | Freq: Every evening | ORAL | Status: DC | PRN

## 2014-03-23 NOTE — Telephone Encounter (Signed)
[name] states pt tends to play with his coumadin (he [facility]in [facility], stating he has been doing this for 13 years and he knows what he is doing), so as far as she knows he is taking Coumadin 2.5 mg by mouth daily.   [name] said that pt will probably not take Trazodone.  States patient was on it before and patient had a fall and now blames it on the Trazodone.    Please advise.

## 2014-03-23 NOTE — Telephone Encounter (Signed)
[name] made aware.

## 2014-03-23 NOTE — Telephone Encounter (Signed)
All sleeping meds will do the same.  trazadone least likely to .

## 2014-03-23 NOTE — Telephone Encounter (Signed)
yes

## 2014-03-23 NOTE — Telephone Encounter (Signed)
Caller name: [name]  Relationship to patient: [place] Can be reached: [phone]   Reason for call: Yetta Flock is returning call to [facility] regarding PT

## 2014-03-23 NOTE — Telephone Encounter (Signed)
Called [name] to verify Coumadin dose.  No answer.  Left message for call back.

## 2014-03-23 NOTE — Telephone Encounter (Signed)
Okay with keeping coumadin dose the same repeat in 2 weeks?

## 2014-03-30 ENCOUNTER — Other Ambulatory Visit: Payer: Self-pay | Admitting: Family Medicine

## 2014-04-01 ENCOUNTER — Telehealth: Payer: Medicare Other

## 2014-04-01 DIAGNOSIS — T83511A Infection and inflammatory reaction due to indwelling urethral catheter, initial encounter: Principal | ICD-10-CM

## 2014-04-01 DIAGNOSIS — N390 Urinary tract infection, site not specified: Secondary | ICD-10-CM

## 2014-04-01 NOTE — Telephone Encounter (Signed)
Spoke with patient and reviewed most recent labs, Made him aware his CR and Bun were elevated and he will need a Nephrology follow up. He verbalized understanding, he is also aware that his  Hemoglobin is low and he requested a copy of the labs. [facility] is going to the home and I made patient aware that I was faxing the order for them to do a Repeat UA and culture due to his UTI. He voiced understanding, orders have been faxed to [facility]. Copy mailed    [place]

## 2014-04-02 ENCOUNTER — Telehealth: Payer: Self-pay | Admitting: Family Medicine

## 2014-04-02 NOTE — Telephone Encounter (Signed)
Patient is still taking 2.5 mg coumadin daily. [name] would like to know when to recheck. Please advise     KP

## 2014-04-02 NOTE — Telephone Encounter (Signed)
2 weeks.  

## 2014-04-02 NOTE — Telephone Encounter (Signed)
Caller name:[name]-[facility] Relation to [phone] Call back [place]:  Reason for call: calling with pt's INR 2.8

## 2014-04-02 NOTE — Telephone Encounter (Signed)
Left a message for call back.  

## 2014-04-03 NOTE — Telephone Encounter (Signed)
Left a message for call back.  

## 2014-04-03 NOTE — Telephone Encounter (Signed)
[name] from [place] was made aware to recheck pt's INR in 2 weeks.  She stated understanding.  No further needs or concerns voiced at this time.

## 2014-04-09 ENCOUNTER — Telehealth: Payer: Self-pay

## 2014-04-09 NOTE — Telephone Encounter (Signed)
Gary [name] returned call.  Per Dr. Etter Sjogren, she was instructed to continue current dose and repeat on Monday.  She stated understanding.

## 2014-04-09 NOTE — Telephone Encounter (Signed)
con't 2.5--- we know pt is taking whatever he wants Check on Monday

## 2014-04-09 NOTE — Telephone Encounter (Signed)
Left a message for call back.  

## 2014-04-09 NOTE — Telephone Encounter (Signed)
[facility]---Gary [name] [phone]  Called to report INR:  3.4 Currently taking Coumadin 2.5 mg daily.    Last INR:  2.8  Gary [name] is scheduled to see patient again on Monday.     Please advise.

## 2014-04-10 ENCOUNTER — Telehealth: Payer: Self-pay

## 2014-04-10 MED ORDER — NITROFURANTOIN MONOHYD MACRO 100 MG PO CAPS: 100.0000 mg | ORAL_CAPSULE | Freq: Two times a day (BID) | ORAL | Status: DC

## 2014-04-10 NOTE — Telephone Encounter (Signed)
Called patient to make him aware that he has a UTI. He verbalized understanding. Rx for Marcobid called in bid x's 7 days.   Patient is requesting something for pain, he is experiencing pain when he has his cath changed.      Please advise     KP

## 2014-04-12 NOTE — Telephone Encounter (Signed)
Refill norco x1

## 2014-04-13 MED ORDER — TRAMADOL HCL 50 MG PO TABS: 50.0000 mg | ORAL_TABLET | Freq: Three times a day (TID) | ORAL | Status: DC | PRN

## 2014-04-13 NOTE — Telephone Encounter (Signed)
Rx faxed per MD       KP

## 2014-04-13 NOTE — Telephone Encounter (Signed)
Patient said he can not take the Hydrocodone. Please advise     KP

## 2014-04-13 NOTE — Telephone Encounter (Signed)
He is allergic to everything---is there one he has had in past that he can take?

## 2014-04-13 NOTE — Telephone Encounter (Signed)
He asked for Tramadol.  Please advise      KP

## 2014-04-14 ENCOUNTER — Telehealth: Payer: Self-pay | Admitting: *Deleted

## 2014-04-14 NOTE — Telephone Encounter (Signed)
Hold today and resume-- recheck 1 week

## 2014-04-14 NOTE — Telephone Encounter (Signed)
INR was 3.4 as reported by [place].  They stated that there were no changes.

## 2014-04-14 NOTE — Telephone Encounter (Signed)
Discussed with patient and he verbalized understanding. He has agreed to hold coumadin and recheck in 1 week.      KP

## 2014-04-20 ENCOUNTER — Encounter: Payer: Self-pay | Admitting: Family Medicine

## 2014-04-20 ENCOUNTER — Other Ambulatory Visit: Payer: Self-pay | Admitting: Family Medicine

## 2014-04-20 ENCOUNTER — Telehealth: Payer: Self-pay | Admitting: *Deleted

## 2014-04-20 NOTE — Telephone Encounter (Signed)
Last seen 06/27/13  and filled 03/23/14 #30  Please advise     KP

## 2014-04-20 NOTE — Telephone Encounter (Signed)
Received home health certification and plan of care via fax by [facility]. Forwarded to Dr. Etter Sjogren. JG//CMA

## 2014-04-21 ENCOUNTER — Telehealth: Payer: Self-pay | Admitting: Family Medicine

## 2014-04-21 DIAGNOSIS — I5022 Chronic systolic (congestive) heart failure: Secondary | ICD-10-CM | POA: Diagnosis not present

## 2014-04-21 DIAGNOSIS — Z466 Encounter for fitting and adjustment of urinary device: Secondary | ICD-10-CM | POA: Diagnosis not present

## 2014-04-21 DIAGNOSIS — N319 Neuromuscular dysfunction of bladder, unspecified: Secondary | ICD-10-CM | POA: Diagnosis not present

## 2014-04-21 DIAGNOSIS — I4891 Unspecified atrial fibrillation: Secondary | ICD-10-CM | POA: Diagnosis not present

## 2014-04-21 NOTE — Telephone Encounter (Signed)
Gary [name] has been made and agreed to recheck in 2 weeks. The patient will continue the same regimen and recheck in 2 weeks   KP

## 2014-04-21 NOTE — Telephone Encounter (Signed)
Caller name:[facility] Relationship to patient:Home Health Can be [phone] Pharmacy:  Reason for call:Pts INR today is 2.8

## 2014-04-21 NOTE — Telephone Encounter (Signed)
Good recheck 2 weeks

## 2014-04-21 NOTE — Telephone Encounter (Signed)
Please advise      KP 

## 2014-04-25 ENCOUNTER — Other Ambulatory Visit: Payer: Self-pay | Admitting: Family Medicine

## 2014-04-30 ENCOUNTER — Telehealth: Payer: Self-pay

## 2014-04-30 NOTE — Telephone Encounter (Signed)
Call from [facility] at [facility] and she stated the patient's INR was 3.6, she said the the patient does better when he takes 2.5. Mg of coumadin for 6 days for 6 days. She wanted to have him hold it tonight and recheck in one week and start him on 2.5mg  for 6 days and recheck the following week. Dr.Lowne agreed to his plan and Bennett Scrape will call with results.      KP

## 2014-05-01 ENCOUNTER — Other Ambulatory Visit: Payer: Self-pay | Admitting: Family Medicine

## 2014-05-01 ENCOUNTER — Telehealth: Payer: Self-pay

## 2014-05-01 NOTE — Telephone Encounter (Signed)
Order signed by provider and faxed back to [facility] at [phone].  Fax confirmation received.

## 2014-05-01 NOTE — Telephone Encounter (Signed)
Order received via fax.  Form numbered and placed in Dr. Nonda Lou red folder for review and signature.

## 2014-05-01 NOTE — Telephone Encounter (Signed)
Last seen 06/27/13 and filled 04/13/14 #30 Patient is homebound, home health is doing his labs.  No UDS   Please advise      KP

## 2014-05-05 ENCOUNTER — Other Ambulatory Visit: Payer: Self-pay | Admitting: Family Medicine

## 2014-05-07 ENCOUNTER — Telehealth: Payer: Self-pay

## 2014-05-07 NOTE — Telephone Encounter (Signed)
Gary [name] called to report INR results:  INR- 3.4   Advice:  Hold Coumadin tonight and Saturday.  Then take 2.5 mg Coumadin all other days. Repeat INR in 1 week.

## 2014-05-14 ENCOUNTER — Telehealth: Payer: Self-pay

## 2014-05-14 NOTE — Telephone Encounter (Signed)
Call from [place] from [facility] advising the INR was 3.3 today. The patient is taking 2.5 daily except on Thurs and Sat.  She is recommending that he not take any on Monday as well to bring INR down and would like to recheck in 1 week. Dr.Tabori agreed to the regimen and recheck in 1 week.       KP

## 2014-06-03 ENCOUNTER — Other Ambulatory Visit: Payer: Self-pay | Admitting: Family Medicine

## 2014-06-04 ENCOUNTER — Telehealth: Payer: Self-pay | Admitting: Family Medicine

## 2014-06-04 NOTE — Telephone Encounter (Signed)
Spoke with Gary [name] and he is taking 2.5 mg daily expect Thursday, sat and Sunday he is not taking anything.  Please advise      KP

## 2014-06-04 NOTE — Telephone Encounter (Signed)
Caller name: [name] from [place] Relation to pt: Call back number: [phone] Pharmacy:  Reason for call:   INR result 1.9

## 2014-06-04 NOTE — Telephone Encounter (Signed)
Detailed message left advising of the recommendations below and to call the office with any concerns.      KP

## 2014-06-04 NOTE — Telephone Encounter (Signed)
Take 2.5 everyday except Thurs and sun---recheck next week

## 2014-06-05 ENCOUNTER — Other Ambulatory Visit: Payer: Self-pay | Admitting: Family Medicine

## 2014-06-06 ENCOUNTER — Other Ambulatory Visit: Payer: Self-pay | Admitting: Family Medicine

## 2014-06-08 NOTE — Telephone Encounter (Signed)
Patient is home bound, Home Health is in the home. Last seen 06/27/13 and Klonopin filled 03/03/14 #30   Please advise    KP

## 2014-06-17 ENCOUNTER — Telehealth: Payer: Self-pay | Admitting: Family Medicine

## 2014-06-17 NOTE — Telephone Encounter (Signed)
Caller name: Gary [name] Relation to pt: self Call back number: [phone] Pharmacy:  Reason for call:   Patient is requesting an order for urine test for when his nurse alisha brown at [facility] comes to visit him. She will be visiting tomorrow morning. Gary [name] # [phone]. He states that he doesn't remember when the last time he had one done.

## 2014-06-17 NOTE — Telephone Encounter (Signed)
Patient states he has frequent UTI's, so he has culture checked frequently.  He has an indwelling catheter and it was changed recently by home health nurse.  Patient states he is having some discomfort and is requesting urine culture be placed so home health nurse can check it tomorrow.    May I call in verbal order?

## 2014-06-18 NOTE — Telephone Encounter (Signed)
Verbal order called to [facility] who stated they will enter order and collect.

## 2014-06-18 NOTE — Telephone Encounter (Signed)
yes

## 2014-06-19 ENCOUNTER — Emergency Department (HOSPITAL_BASED_OUTPATIENT_CLINIC_OR_DEPARTMENT_OTHER)
Admission: EM | Admit: 2014-06-19 | Discharge: 2014-06-19 | Disposition: A | Discharge status: Home / Self Care | Payer: Medicare Other | Attending: Emergency Medicine | Admitting: Emergency Medicine

## 2014-06-19 ENCOUNTER — Encounter (HOSPITAL_BASED_OUTPATIENT_CLINIC_OR_DEPARTMENT_OTHER): Payer: Self-pay | Admitting: *Deleted

## 2014-06-19 DIAGNOSIS — Z87891 Personal history of nicotine dependence: Secondary | ICD-10-CM | POA: Insufficient documentation

## 2014-06-19 DIAGNOSIS — Z880 Allergy status to penicillin: Secondary | ICD-10-CM | POA: Diagnosis not present

## 2014-06-19 DIAGNOSIS — Z951 Presence of aortocoronary bypass graft: Secondary | ICD-10-CM | POA: Diagnosis not present

## 2014-06-19 DIAGNOSIS — Z7901 Long term (current) use of anticoagulants: Secondary | ICD-10-CM | POA: Insufficient documentation

## 2014-06-19 DIAGNOSIS — I5022 Chronic systolic (congestive) heart failure: Secondary | ICD-10-CM | POA: Insufficient documentation

## 2014-06-19 DIAGNOSIS — D649 Anemia, unspecified: Secondary | ICD-10-CM | POA: Insufficient documentation

## 2014-06-19 DIAGNOSIS — Z7982 Long term (current) use of aspirin: Secondary | ICD-10-CM | POA: Diagnosis not present

## 2014-06-19 DIAGNOSIS — E785 Hyperlipidemia, unspecified: Secondary | ICD-10-CM | POA: Diagnosis not present

## 2014-06-19 DIAGNOSIS — K219 Gastro-esophageal reflux disease without esophagitis: Secondary | ICD-10-CM | POA: Diagnosis not present

## 2014-06-19 DIAGNOSIS — Y846 Urinary catheterization as the cause of abnormal reaction of the patient, or of later complication, without mention of misadventure at the time of the procedure: Secondary | ICD-10-CM | POA: Insufficient documentation

## 2014-06-19 DIAGNOSIS — T839XXA Unspecified complication of genitourinary prosthetic device, implant and graft, initial encounter: Secondary | ICD-10-CM

## 2014-06-19 DIAGNOSIS — I10 Essential (primary) hypertension: Secondary | ICD-10-CM | POA: Insufficient documentation

## 2014-06-19 DIAGNOSIS — T83098A Other mechanical complication of other indwelling urethral catheter, initial encounter: Secondary | ICD-10-CM | POA: Diagnosis not present

## 2014-06-19 DIAGNOSIS — Z79899 Other long term (current) drug therapy: Secondary | ICD-10-CM | POA: Insufficient documentation

## 2014-06-19 DIAGNOSIS — Z87448 Personal history of other diseases of urinary system: Secondary | ICD-10-CM | POA: Diagnosis not present

## 2014-06-19 LAB — URINE MICROSCOPIC-ADD ON

## 2014-06-19 LAB — URINALYSIS, ROUTINE W REFLEX MICROSCOPIC
BILIRUBIN URINE: NEGATIVE
GLUCOSE, UA: NEGATIVE mg/dL
Ketones, ur: NEGATIVE mg/dL
NITRITE: NEGATIVE
Protein, ur: NEGATIVE mg/dL
SPECIFIC GRAVITY, URINE: 1.009 (ref 1.005–1.030)
Urobilinogen, UA: 0.2 mg/dL (ref 0.0–1.0)
pH: 7 (ref 5.0–8.0)

## 2014-06-19 NOTE — ED Provider Notes (Signed)
CSN: [id number]     Arrival date & time 06/19/14  1511 History   First MD Initiated Contact with Patient 06/19/14 1523     No chief complaint on file.    (Consider location/radiation/quality/duration/timing/severity/associated sxs/prior Treatment) HPI Comments: Pt comes in with c/o the foley catheter being blocked. Pt states that he has had catheters in for the last 14 years. He had this one changed 2 weeks ago. She states that it was draining a little this morning and then he started to leak around the catheter. He tried to flush the the catheter without relief. He states that now he is having pain and pressure in the abdomen. History of similar symptoms  The history is provided by the patient. No language interpreter was used.    Past Medical History  Diagnosis Date  . Neurogenic bladder     has had bladder catheter changes at the urology office every 12 weeks  . Anemia   . Hyperlipidemia   . Hypertension   . Heart attack     1983  . Chronic systolic heart failure     a. prior EF 20-30%;  b. Echo 7/09: EF 35-40%, basal inferoposterior HK, mild AI, mild to moderate MR, moderate to marked LAE, mild to moderate TR, mildly increased PASP, moderate RAE;   c. Echo 01/2012:   mild LVH, EF 50%, Tr AI, mild MR, severe LAE, mod to severe RAE, mod to severe RVE, mod reduced RVSF, mod to severe TR, PASP 46  . Arrhythmia     afib  . Warfarin anticoagulation   . GERD (gastroesophageal reflux disease)   . Anxiety   . Renal failure    Past Surgical History  Procedure Laterality Date  . Appendectomy    . Cholecystectomy      1971  . Coronary artery bypass graft      1995   Family History  Problem Relation Age of Onset  . Cancer Mother   . Heart attack Father     deceased at 71, smoking, ETOH abuse  . Breast cancer Sister    History  Substance Use Topics  . Smoking status: Former Research scientist (life sciences)  . Smokeless tobacco: Not on file  . Alcohol Use: No    Review of Systems  All other systems  reviewed and are negative.     Allergies  Amoxicillin; Diltiazem hcl; Hydrocodone-acetaminophen; Niacin; Niacin-lovastatin er; Paroxetine; Propoxyphene n-acetaminophen; Pseudoephedrine; Sulfonamide derivatives; and Tolterodine tartrate  Home Medications   Prior to Admission medications   Medication Sig Start Date End Date Taking? Authorizing Provider  acetaminophen (TYLENOL) 500 MG tablet Take 1,000 mg by mouth 3 (three) times daily.    Historical Provider, MD  aspirin 81 MG tablet Take 81 mg by mouth 2 (two) times daily.     Historical Provider, MD  atorvastatin (LIPITOR) 20 MG tablet TAKE 1 TABLET BY MOUTH DAILY 03/23/14   [name], MD  carvedilol (COREG) 12.5 MG tablet Take 1 tablet (12.5 mg total) by mouth 2 (two) times daily with a meal. 03/12/14   Rosalita Chessman, DO  clonazePAM (KLONOPIN) 1 MG tablet TAKE 1 TABLET BY MOUTH TWICE DAILY AS NEEDED FOR ANXIETY 06/08/14   Alferd Apa Lowne, DO  enalapril (VASOTEC) 20 MG tablet Take 1 tablet (20 mg total) by mouth 2 (two) times daily. 03/03/14   Alferd Apa Lowne, DO  enalapril (VASOTEC) 20 MG tablet Take 1 tablet (20 mg total) by mouth 2 (two) times daily. 03/12/14   Alferd Apa  Lowne, DO  fenofibrate 54 MG tablet TAKE 1 TABLET BY MOUTH TWICE DAILY 06/04/14   Rosalita Chessman, DO  ferrous sulfate 325 (65 FE) MG tablet Take 325 mg by mouth daily with breakfast.     Historical Provider, MD  folic acid (FOLVITE) 1 MG tablet Take 1 mg by mouth daily.      Historical Provider, MD  furosemide (LASIX) 40 MG tablet One tablet and one half daily and AS NEEDED FOR WEIGHT GAIN OVER 195 09/26/13   [name], MD  furosemide (LASIX) 40 MG tablet TAKE 2 TABLETS BY MOUTH EVERY DAY AND AS NEEDED FOR WEIGHT GAIN OVER 195 03/23/14   [name], MD  Glucos-Chondroit-Hyaluron-MSM (GLUCOSAMINE CHONDROITIN JOINT PO) Take 1 tablet by mouth 2 (two) times daily.    Historical Provider, MD  HYDROcodone-acetaminophen (NORCO) 5-325 MG per tablet Take 1-2 tablets by  mouth every 4 (four) hours as needed. 02/11/14   Sherwood Gambler, MD  levofloxacin (LEVAQUIN) 500 MG tablet Take 1 tablet (500 mg total) by mouth daily. 03/13/14   Charlesetta Shanks, MD  lidocaine (XYLOCAINE) 2 % jelly Place 1 application into the urethra daily as needed (pain).    Historical Provider, MD  Meth-Hyo-M Bl-Na Phos-Ph Sal (URIBEL) 118 MG CAPS TAKE 1 TO 4 CAPSULE BY MOUTH EVERY DAY AS DIRECTED 06/08/14   Rosalita Chessman, DO  Misc Natural Products (OSTEO BI-FLEX JOINT SHIELD PO) Take 650 mg by mouth 2 (two) times daily.    Historical Provider, MD  Multiple Vitamin (MULTIVITAMIN) capsule Take 1 capsule by mouth daily.      Historical Provider, MD  nitrofurantoin, macrocrystal-monohydrate, (MACROBID) 100 MG capsule Take 1 capsule (100 mg total) by mouth 2 (two) times daily. 04/10/14   Rosalita Chessman, DO  nystatin-triamcinolone ointment (MYCOLOG) APPLY TO THE AFFECTED AREA THREE TIME A DAY    [name], DO  ondansetron (ZOFRAN) 8 MG tablet TAKE 1 TABLET BY MOUTH EVERY 12 HOURS AS NEEDED FOR NAUSEA 03/30/14   Alferd Apa Lowne, DO  ondansetron (ZOFRAN) 8 MG tablet TAKE 1 TABLET BY MOUTH EVERY 12 HOURS AS NEEDED FOR NAUSEA 06/08/14   Rosalita Chessman, DO  traMADol (ULTRAM) 50 MG tablet TAKE 1 TABLET BY MOUTH EVERY 8 HOURS AS NEEDED 05/01/14   Rosalita Chessman, DO  traZODone (DESYREL) 50 MG tablet TAKE 1/2 TO 1 TABLET BY MOUTH EVERY NIGHT AT BEDTIME AS NEEDED FOR SLEEP 04/20/14   Alferd Apa Lowne, DO  triamcinolone cream (KENALOG) 0.1 % APPLY EXTERNALLY TO THE AFFECTED AREA TWICE DAILY AS NEEDED DO NOT USE ON FACE, GROIN, OR UNDERARMS 05/05/14   Rosalita Chessman, DO  VITAMIN D, CHOLECALCIFEROL, PO Take 1 tablet by mouth daily.    Historical Provider, MD  warfarin (COUMADIN) 2.5 MG tablet TAKE 1 TABLET BY MOUTH DAILY; EXCEPT TAKE 1/2 TABLET ON [facility] 04/27/14   Rosalita Chessman, DO  warfarin (COUMADIN) 5 MG tablet TAKE 1 TABLET BY MOUTH EVERY DAY AS DIRECTED 12/08/13   Alferd Apa Lowne, DO   BP 128/106 mmHg  Pulse 82   Temp(Src) 98 F (36.7 C) (Oral)  Resp 20  Ht 6' (1.829 m)  Wt 195 lb (88.451 kg)  BMI 26.44 kg/m2  SpO2 98% Physical Exam  Constitutional: He is oriented to person, place, and time. He appears well-developed and well-nourished. He appears distressed.  Cardiovascular: Normal rate and regular rhythm.   Pulmonary/Chest: Effort normal and breath sounds normal.  Abdominal: Soft. He exhibits distension.  There is tenderness.  Musculoskeletal: Normal range of motion.  Neurological: He is alert and oriented to person, place, and time.  Skin: Skin is warm and dry.  Nursing note and vitals reviewed.   ED Course  Procedures (including critical care time) Labs Review Labs Reviewed  URINALYSIS, ROUTINE W REFLEX MICROSCOPIC - Abnormal; Notable for the following:    Color, Urine GREEN (*)    Hgb urine dipstick LARGE (*)    Leukocytes, UA MODERATE (*)    All other components within normal limits  URINE MICROSCOPIC-ADD ON - Abnormal; Notable for the following:    Bacteria, UA FEW (*)    All other components within normal limits  URINE CULTURE    Imaging Review No results found.   EKG Interpretation None      MDM   Final diagnoses:  Foley catheter problem, initial encounter    Urine sent for culture. New foley catheter in place and pt is comfortable at this time    Glendell Docker, NP 06/19/14 [place], MD 06/19/14 1701

## 2014-06-19 NOTE — ED Notes (Signed)
Urinary catheter is blocked. States he has had indwelling catheters for the past 14 years. The current catheter was changed 2 weeks ago. He has attempted to irrigate with no success.

## 2014-06-22 ENCOUNTER — Encounter (HOSPITAL_BASED_OUTPATIENT_CLINIC_OR_DEPARTMENT_OTHER): Payer: Self-pay | Admitting: Emergency Medicine

## 2014-06-22 ENCOUNTER — Emergency Department (HOSPITAL_BASED_OUTPATIENT_CLINIC_OR_DEPARTMENT_OTHER)
Admission: EM | Admit: 2014-06-22 | Discharge: 2014-06-22 | Disposition: A | Discharge status: Home / Self Care | Payer: Medicare Other | Attending: Emergency Medicine | Admitting: Emergency Medicine

## 2014-06-22 DIAGNOSIS — D649 Anemia, unspecified: Secondary | ICD-10-CM | POA: Insufficient documentation

## 2014-06-22 DIAGNOSIS — A499 Bacterial infection, unspecified: Secondary | ICD-10-CM

## 2014-06-22 DIAGNOSIS — I5022 Chronic systolic (congestive) heart failure: Secondary | ICD-10-CM | POA: Insufficient documentation

## 2014-06-22 DIAGNOSIS — Z87891 Personal history of nicotine dependence: Secondary | ICD-10-CM | POA: Diagnosis not present

## 2014-06-22 DIAGNOSIS — I10 Essential (primary) hypertension: Secondary | ICD-10-CM | POA: Diagnosis not present

## 2014-06-22 DIAGNOSIS — R109 Unspecified abdominal pain: Secondary | ICD-10-CM | POA: Diagnosis present

## 2014-06-22 DIAGNOSIS — T83098A Other mechanical complication of other indwelling urethral catheter, initial encounter: Secondary | ICD-10-CM | POA: Insufficient documentation

## 2014-06-22 DIAGNOSIS — Z7952 Long term (current) use of systemic steroids: Secondary | ICD-10-CM | POA: Diagnosis not present

## 2014-06-22 DIAGNOSIS — E785 Hyperlipidemia, unspecified: Secondary | ICD-10-CM | POA: Insufficient documentation

## 2014-06-22 DIAGNOSIS — N390 Urinary tract infection, site not specified: Secondary | ICD-10-CM | POA: Diagnosis not present

## 2014-06-22 DIAGNOSIS — Z79899 Other long term (current) drug therapy: Secondary | ICD-10-CM | POA: Diagnosis not present

## 2014-06-22 DIAGNOSIS — I4891 Unspecified atrial fibrillation: Secondary | ICD-10-CM | POA: Diagnosis not present

## 2014-06-22 DIAGNOSIS — Y846 Urinary catheterization as the cause of abnormal reaction of the patient, or of later complication, without mention of misadventure at the time of the procedure: Secondary | ICD-10-CM | POA: Diagnosis not present

## 2014-06-22 DIAGNOSIS — N319 Neuromuscular dysfunction of bladder, unspecified: Secondary | ICD-10-CM | POA: Diagnosis not present

## 2014-06-22 DIAGNOSIS — Z466 Encounter for fitting and adjustment of urinary device: Secondary | ICD-10-CM | POA: Diagnosis not present

## 2014-06-22 DIAGNOSIS — T83091A Other mechanical complication of indwelling urethral catheter, initial encounter: Secondary | ICD-10-CM

## 2014-06-22 DIAGNOSIS — K219 Gastro-esophageal reflux disease without esophagitis: Secondary | ICD-10-CM | POA: Diagnosis not present

## 2014-06-22 DIAGNOSIS — Z880 Allergy status to penicillin: Secondary | ICD-10-CM | POA: Diagnosis not present

## 2014-06-22 DIAGNOSIS — Z8701 Personal history of pneumonia (recurrent): Secondary | ICD-10-CM | POA: Diagnosis not present

## 2014-06-22 DIAGNOSIS — F419 Anxiety disorder, unspecified: Secondary | ICD-10-CM | POA: Diagnosis not present

## 2014-06-22 DIAGNOSIS — Z7982 Long term (current) use of aspirin: Secondary | ICD-10-CM | POA: Diagnosis not present

## 2014-06-22 LAB — URINE CULTURE: Colony Count: >=100000

## 2014-06-22 MED ORDER — CEPHALEXIN 500 MG PO CAPS: 500.0000 mg | ORAL_CAPSULE | Freq: Three times a day (TID) | ORAL | Status: DC

## 2014-06-22 MED ORDER — CEFTRIAXONE SODIUM 1 G IJ SOLR
1.0000 g | Freq: Once | INTRAMUSCULAR | Status: AC
  Administered 2014-06-22: 1 g via INTRAVENOUS

## 2014-06-22 MED ORDER — CEFTRIAXONE SODIUM 1 G IJ SOLR
INTRAMUSCULAR | Status: AC
  Filled 2014-06-22: qty 10

## 2014-06-22 NOTE — ED Provider Notes (Signed)
CSN: [id number]     Arrival date & time 06/22/14  1546 History  This chart was scribed for Gary Speak, MD by Stephania Fragmin, ED Scribe. This patient was seen in room MH11/MH11 and the patient's care was started at 4:10 PM.   Chief Complaint  Patient presents with  . Abdominal Pain   Patient is a 81 y.o. male presenting with abdominal pain. The history is provided by the patient and medical records. No language interpreter was used.  Abdominal Pain Pain location:  Suprapubic Pain radiates to:  Does not radiate Pain severity:  Moderate Onset quality:  Gradual Duration:  1 day Timing:  Constant Progression:  Resolved (since changing catheter here) Chronicity:  Recurrent Context comment:  Recently changed catheter Relieved by: changing catheter. Worsened by:  Nothing tried Ineffective treatments:  None tried Associated symptoms: no constipation, no diarrhea and no fever      HPI Comments: Gary STEIGHNER Sr. is a 81 y.o. male who presents to the Emergency Department brought in by ambulance complaining of constant, suprapubic abdominal pain due to a clotted catheter. Patient reports he has had an indwelling catheter for 14 years and has gotten the same abdominal pain in the pain when his catheter gets clotted. He recently had a new one placed 3 days ago, per medical records. Patient last checked his catheter before coming here. Patient endorses a past history of having a UTI. He denies any bowel symptoms or fever.   Past Medical History  Diagnosis Date  . Neurogenic bladder     has had bladder catheter changes at the urology office every 12 weeks  . Anemia   . Hyperlipidemia   . Hypertension   . Heart attack     1983  . Chronic systolic heart failure     a. prior EF 20-30%;  b. Echo 7/09: EF 35-40%, basal inferoposterior HK, mild AI, mild to moderate MR, moderate to marked LAE, mild to moderate TR, mildly increased PASP, moderate RAE;   c. Echo 01/2012:   mild LVH, EF 50%, Tr AI, mild  MR, severe LAE, mod to severe RAE, mod to severe RVE, mod reduced RVSF, mod to severe TR, PASP 46  . Arrhythmia     afib  . Warfarin anticoagulation   . GERD (gastroesophageal reflux disease)   . Anxiety   . Renal failure    Past Surgical History  Procedure Laterality Date  . Appendectomy    . Cholecystectomy      1971  . Coronary artery bypass graft      1995   Family History  Problem Relation Age of Onset  . Cancer Mother   . Heart attack Father     deceased at 36, smoking, ETOH abuse  . Breast cancer Sister    History  Substance Use Topics  . Smoking status: Former Research scientist (life sciences)  . Smokeless tobacco: Not on file  . Alcohol Use: No    Review of Systems  Constitutional: Negative for fever.  Gastrointestinal: Positive for abdominal pain. Negative for diarrhea, constipation and blood in stool.  All other systems reviewed and are negative.     Allergies  Amoxicillin; Diltiazem hcl; Hydrocodone-acetaminophen; Niacin; Niacin-lovastatin er; Paroxetine; Propoxyphene n-acetaminophen; Pseudoephedrine; Sulfonamide derivatives; and Tolterodine tartrate  Home Medications   Prior to Admission medications   Medication Sig Start Date End Date Taking? Authorizing Provider  acetaminophen (TYLENOL) 500 MG tablet Take 1,000 mg by mouth 3 (three) times daily.    Historical Provider, MD  aspirin  81 MG tablet Take 81 mg by mouth 2 (two) times daily.     Historical Provider, MD  atorvastatin (LIPITOR) 20 MG tablet TAKE 1 TABLET BY MOUTH DAILY 03/23/14   [name], MD  carvedilol (COREG) 12.5 MG tablet Take 1 tablet (12.5 mg total) by mouth 2 (two) times daily with a meal. 03/12/14   Rosalita Chessman, DO  clonazePAM (KLONOPIN) 1 MG tablet TAKE 1 TABLET BY MOUTH TWICE DAILY AS NEEDED FOR ANXIETY 06/08/14   Alferd Apa Lowne, DO  enalapril (VASOTEC) 20 MG tablet Take 1 tablet (20 mg total) by mouth 2 (two) times daily. 03/03/14   Alferd Apa Lowne, DO  enalapril (VASOTEC) 20 MG tablet Take 1 tablet (20  mg total) by mouth 2 (two) times daily. 03/12/14   Rosalita Chessman, DO  fenofibrate 54 MG tablet TAKE 1 TABLET BY MOUTH TWICE DAILY 06/04/14   Rosalita Chessman, DO  ferrous sulfate 325 (65 FE) MG tablet Take 325 mg by mouth daily with breakfast.     Historical Provider, MD  folic acid (FOLVITE) 1 MG tablet Take 1 mg by mouth daily.      Historical Provider, MD  furosemide (LASIX) 40 MG tablet One tablet and one half daily and AS NEEDED FOR WEIGHT GAIN OVER 195 09/26/13   [name], MD  furosemide (LASIX) 40 MG tablet TAKE 2 TABLETS BY MOUTH EVERY DAY AND AS NEEDED FOR WEIGHT GAIN OVER 195 03/23/14   [name], MD  Glucos-Chondroit-Hyaluron-MSM (GLUCOSAMINE CHONDROITIN JOINT PO) Take 1 tablet by mouth 2 (two) times daily.    Historical Provider, MD  HYDROcodone-acetaminophen (NORCO) 5-325 MG per tablet Take 1-2 tablets by mouth every 4 (four) hours as needed. 02/11/14   Sherwood Gambler, MD  levofloxacin (LEVAQUIN) 500 MG tablet Take 1 tablet (500 mg total) by mouth daily. 03/13/14   Charlesetta Shanks, MD  lidocaine (XYLOCAINE) 2 % jelly Place 1 application into the urethra daily as needed (pain).    Historical Provider, MD  Meth-Hyo-M Bl-Na Phos-Ph Sal (URIBEL) 118 MG CAPS TAKE 1 TO 4 CAPSULE BY MOUTH EVERY DAY AS DIRECTED 06/08/14   Rosalita Chessman, DO  Misc Natural Products (OSTEO BI-FLEX JOINT SHIELD PO) Take 650 mg by mouth 2 (two) times daily.    Historical Provider, MD  Multiple Vitamin (MULTIVITAMIN) capsule Take 1 capsule by mouth daily.      Historical Provider, MD  nitrofurantoin, macrocrystal-monohydrate, (MACROBID) 100 MG capsule Take 1 capsule (100 mg total) by mouth 2 (two) times daily. 04/10/14   Rosalita Chessman, DO  nystatin-triamcinolone ointment (MYCOLOG) APPLY TO THE AFFECTED AREA THREE TIME A DAY    [name], DO  ondansetron (ZOFRAN) 8 MG tablet TAKE 1 TABLET BY MOUTH EVERY 12 HOURS AS NEEDED FOR NAUSEA 03/30/14   Alferd Apa Lowne, DO  ondansetron (ZOFRAN) 8 MG tablet TAKE 1 TABLET  BY MOUTH EVERY 12 HOURS AS NEEDED FOR NAUSEA 06/08/14   Rosalita Chessman, DO  traMADol (ULTRAM) 50 MG tablet TAKE 1 TABLET BY MOUTH EVERY 8 HOURS AS NEEDED 05/01/14   Rosalita Chessman, DO  traZODone (DESYREL) 50 MG tablet TAKE 1/2 TO 1 TABLET BY MOUTH EVERY NIGHT AT BEDTIME AS NEEDED FOR SLEEP 04/20/14   Alferd Apa Lowne, DO  triamcinolone cream (KENALOG) 0.1 % APPLY EXTERNALLY TO THE AFFECTED AREA TWICE DAILY AS NEEDED DO NOT USE ON FACE, GROIN, OR UNDERARMS 05/05/14   Rosalita Chessman, DO  VITAMIN D, CHOLECALCIFEROL,  PO Take 1 tablet by mouth daily.    Historical Provider, MD  warfarin (COUMADIN) 2.5 MG tablet TAKE 1 TABLET BY MOUTH DAILY; EXCEPT TAKE 1/2 TABLET ON [facility] 04/27/14   Rosalita Chessman, DO  warfarin (COUMADIN) 5 MG tablet TAKE 1 TABLET BY MOUTH EVERY DAY AS DIRECTED 12/08/13   Rosalita Chessman, DO   SpO2 99% Physical Exam  Constitutional: He is oriented to person, place, and time. He appears well-developed and well-nourished. No distress.  HENT:  Head: Normocephalic and atraumatic.  Eyes: Conjunctivae and EOM are normal.  Neck: Neck supple. No tracheal deviation present.  Cardiovascular: Normal rate.   Pulmonary/Chest: Effort normal. No respiratory distress.  Abdominal: Soft. He exhibits no distension. There is no tenderness.  Musculoskeletal: Normal range of motion.  Neurological: He is alert and oriented to person, place, and time.  Skin: Skin is warm and dry.  Psychiatric: He has a normal mood and affect. His behavior is normal.  Nursing note and vitals reviewed.   ED Course  Procedures (including critical care time)  DIAGNOSTIC STUDIES: Oxygen Saturation is 99% on room air, normal by my interpretation.    COORDINATION OF CARE: 4:16 PM - Discussed treatment plan with pt at bedside which includes Rocephin given here, and Rx Keflex, and pt agreed to plan.   Labs Review Labs Reviewed - No data to display  Imaging Review No results found.   EKG Interpretation None      MDM    Final diagnoses:  None    Patient presents with complaints of lower abdominal discomfort and feeling as though his indwelling Foley is plugged up. He has not had much urine output this morning. The Foley catheter was removed and replaced. A significant quantity of urine was obtained and the patient's symptoms have since resolved. He had a urine culture 2 days ago which grew Escherichia coli sensitive to Rocephin. He was given Rocephin in the ER and will be discharged with Keflex.   I personally performed the services described in this documentation, which was scribed in my presence. The recorded information has been reviewed and is accurate.      Gary Speak, MD 06/22/14 [phone]

## 2014-06-22 NOTE — ED Notes (Signed)
Per EMS:  Pt from home, had catheter placed on Friday.  Pt having lower abdominal pain with hematuria in catheter bag.  Pt returned here for treatment.

## 2014-06-22 NOTE — Discharge Instructions (Signed)
Keflex as prescribed.    Follow up with your urologist if you experience additional problems.   Urinary Tract Infection Urinary tract infections (UTIs) can develop anywhere along your urinary tract. Your urinary tract is your body's drainage system for removing wastes and extra water. Your urinary tract includes two kidneys, two ureters, a bladder, and a urethra. Your kidneys are a pair of bean-shaped organs. Each kidney is about the size of your fist. They are located below your ribs, one on each side of your spine. CAUSES Infections are caused by microbes, which are microscopic organisms, including fungi, viruses, and bacteria. These organisms are so small that they can only be seen through a microscope. Bacteria are the microbes that most commonly cause UTIs. SYMPTOMS  Symptoms of UTIs may vary by age and gender of the patient and by the location of the infection. Symptoms in young women typically include a frequent and intense urge to urinate and a painful, burning feeling in the bladder or urethra during urination. Older women and men are more likely to be tired, shaky, and weak and have muscle aches and abdominal pain. A fever may mean the infection is in your kidneys. Other symptoms of a kidney infection include pain in your back or sides below the ribs, nausea, and vomiting. DIAGNOSIS To diagnose a UTI, your caregiver will ask you about your symptoms. Your caregiver also will ask to provide a urine sample. The urine sample will be tested for bacteria and white blood cells. White blood cells are made by your body to help fight infection. TREATMENT  Typically, UTIs can be treated with medication. Because most UTIs are caused by a bacterial infection, they usually can be treated with the use of antibiotics. The choice of antibiotic and length of treatment depend on your symptoms and the type of bacteria causing your infection. HOME CARE INSTRUCTIONS  If you were prescribed antibiotics, take  them exactly as your caregiver instructs you. Finish the medication even if you feel better after you have only taken some of the medication.  Drink enough water and fluids to keep your urine clear or pale yellow.  Avoid caffeine, tea, and carbonated beverages. They tend to irritate your bladder.  Empty your bladder often. Avoid holding urine for long periods of time.  Empty your bladder before and after sexual intercourse.  After a bowel movement, women should cleanse from front to back. Use each tissue only once. SEEK MEDICAL CARE IF:   You have back pain.  You develop a fever.  Your symptoms do not begin to resolve within 3 days. SEEK IMMEDIATE MEDICAL CARE IF:   You have severe back pain or lower abdominal pain.  You develop chills.  You have nausea or vomiting.  You have continued burning or discomfort with urination. MAKE SURE YOU:   Understand these instructions.  Will watch your condition.  Will get help right away if you are not doing well or get worse. Document Released: 11/23/2004 Document Revised: 08/15/2011 Document Reviewed: 03/24/2011 [facility] Patient Information 2015 [place], [place]. This information is not intended to replace advice given to you by your health care provider. Make sure you discuss any questions you have with your health care provider.

## 2014-06-23 ENCOUNTER — Telehealth (HOSPITAL_COMMUNITY): Payer: Self-pay | Admitting: *Deleted

## 2014-06-23 ENCOUNTER — Telehealth: Payer: Self-pay | Admitting: Family Medicine

## 2014-06-23 NOTE — Telephone Encounter (Signed)
Caller name: baird Relation to pt: self Call back number:  [phone] Pharmacy:  Reason for call:   Patient states that he was seen in ED for catheter problems and discovered that he had a uti and was given keflex. He states that he is not supposed to take this while taking warfarin. Patient states that he has not taken warfarin last night and will not be taking it tonight. His concerns are what do to if this makes his blood too thin???

## 2014-06-23 NOTE — Telephone Encounter (Signed)
Please review and advise     KP 

## 2014-06-23 NOTE — Telephone Encounter (Signed)
Patient has been made aware and verbalized understanding. I have called [name] at [facility] and left a message to make her aware to check INR levels.         KP

## 2014-06-23 NOTE — Telephone Encounter (Signed)
Gary [name] has been made and has agreed to go to the home on Thursday to check the INR due to the patient starting Abx today. She will call in the report.     KP

## 2014-06-23 NOTE — Telephone Encounter (Signed)
Most abx affect PT---- he should keep taking meds as rx and check INR more frequently -- and we will adjust coumadin accordingly

## 2014-06-24 ENCOUNTER — Other Ambulatory Visit: Payer: Self-pay | Admitting: Physician Assistant

## 2014-06-24 ENCOUNTER — Telehealth: Payer: Self-pay | Admitting: Family Medicine

## 2014-06-24 MED ORDER — CIPROFLOXACIN HCL 500 MG PO TABS: 500.0000 mg | ORAL_TABLET | Freq: Every day | ORAL | Status: DC

## 2014-06-24 NOTE — Telephone Encounter (Signed)
Previous note in error.  Called patient at [phone] [facility]) *Preferred* and left voicemail to return call.

## 2014-06-24 NOTE — Telephone Encounter (Signed)
Rx for Cipro sent to [facility] per patient preference.  Patient notified and he stated understanding.  Instructed patient to let home health nurse know of medication change when she checks his INR, he stated understanding and agreed.

## 2014-06-24 NOTE — Telephone Encounter (Signed)
Rx reprinted and faxed.

## 2014-06-24 NOTE — Telephone Encounter (Signed)
Caller name: Roderrick Relation to pt: self Call back number: [phone] Pharmacy: [facility] on [address]  Reason for call:   Patient was given keflex at the ED for a uti and he states that he can't tolerate this medication. Giving him diarrhea. Would like to know if something else could be called in.

## 2014-06-25 ENCOUNTER — Telehealth: Payer: Self-pay | Admitting: *Deleted

## 2014-06-25 ENCOUNTER — Telehealth: Payer: Self-pay | Admitting: Cardiology

## 2014-06-25 NOTE — Telephone Encounter (Signed)
New Message  Pt c/o medication issue:  1. Name of Medication: Flouresimide  2. How are you currently taking this medication (dosage and times per day)? 80mg  (am) and 80mg  (pm)   3. Are you having a reaction (difficulty breathing--STAT)? No   4. What is your medication issue? Pt called states that he is on Florosimide. Pt reports that he was advised to maintain a 195 lbs wt all of times. He reports that he fluctuates between 185-195 lbs. Currently taking 80 mg in the morning and 80 at night of the floresimide. Retaining water in his belly for some reason per pt. Requests a call back to discuss not loosing body mass. Made appt with Cecille Rubin on 06/21.

## 2014-06-25 NOTE — Telephone Encounter (Signed)
When does he finish abx-- just take as he was before abx

## 2014-06-25 NOTE — Telephone Encounter (Addendum)
Spoke with pt, he called to discuss his weight and furosemide. His average weight is 190 but ranges from 185 to 200lb. For the last week he has taken 80 mg of furosemide bid because his weight was up to 200 lb. Today he is back down to 190 lb. He denies edema or SOB but he does feel like his abdomen is still swollen. He is currently being tx for a UTI. He only took one furosemide this am because his weight is back down. Explained to patient it will be okay to continue on the 80 of furosemide for a couple more days to see if that will help with the abdominal distention. Patient voiced understanding

## 2014-06-25 NOTE — Telephone Encounter (Signed)
Patient's home health nurse called and stated that patient's INR was 2.4.  However, patient has not taken his warfarin in 4 days because he was taking an antibiotic (spoke with patient earlier this week and notified him that he should still be taking his warfarin- INR would be checked frequently).  Attempted to schedule patient for an ED follow-up, but patient declined.  Notified patient that he would need to be seen in office as it has been almost a year, patient declined.  Patient states he does not have transportation and has too much else going on.  He would like to know how to take the coumadin.   Please advise.

## 2014-06-26 NOTE — Telephone Encounter (Signed)
FYI- Notified patient to take according to normal coumadin schedule.  Current abx are Cipro and he is finished in 3 days.

## 2014-06-29 ENCOUNTER — Telehealth: Payer: Self-pay | Admitting: Nurse Practitioner

## 2014-06-29 NOTE — Telephone Encounter (Signed)
New message     Home care nurse checks the patient's PT/INR and Dr Etter Sjogren adjust it.  Pt want to start sending Cecille Rubin his pt/inr results and let her adjust his coumadin.  He says the reason for the switch is for lack of communication between.  Sometimes it is days, weeks or never before the home health nurse gets a call back from Dr Nonda Lou office.  Will Cecille Rubin agree to have his PT/INR come here.  Pt is homebound and cannot come in the office

## 2014-06-29 NOTE — Telephone Encounter (Signed)
Spoke with pt and he wants Cardiology, Dr Stanford Breed  to start monitoring his Coumadin and not Dr Etter Sjogren anymore. Telephoned [facility] and their next visit is scheduled for 07/09/14 and they are aware to call results to Dr Jacalyn Lefevre office, they have office number and fax #.

## 2014-07-07 ENCOUNTER — Telehealth: Payer: Self-pay | Admitting: Cardiology

## 2014-07-07 MED ORDER — FUROSEMIDE 40 MG PO TABS: 40.0000 mg | ORAL_TABLET | Freq: Two times a day (BID) | ORAL | Status: DC

## 2014-07-07 NOTE — Telephone Encounter (Signed)
New messagge        Pt c/o medication issue:  1. Name of Medication: lasix 2. How are you currently taking this medication (dosage and times per day)?  40mg  2 in am and 2 in pm-----presc says 2 daily and as need to maintain wt around 195lbs 3. Are you having a reaction (difficulty breathing--STAT)? no  4. What is your medication issue? Pt is out of medication.  Because of the way presc is written, he cannot get it refilled and ins will pay.  Please call in a new presc with 40mg  2 in am and 2 in pm so that they can bill ins for the exact amount he is taking,

## 2014-07-07 NOTE — Telephone Encounter (Signed)
Furosemide script changed and sent to the pharmacy electronically.

## 2014-07-08 ENCOUNTER — Ambulatory Visit (INDEPENDENT_AMBULATORY_CARE_PROVIDER_SITE_OTHER): Payer: Medicare Other | Admitting: Pharmacist Clinician (PhC)/ Clinical Pharmacy Specialist

## 2014-07-08 DIAGNOSIS — Z7901 Long term (current) use of anticoagulants: Secondary | ICD-10-CM

## 2014-07-08 DIAGNOSIS — I482 Chronic atrial fibrillation: Secondary | ICD-10-CM

## 2014-07-08 DIAGNOSIS — I4821 Permanent atrial fibrillation: Secondary | ICD-10-CM

## 2014-07-08 HISTORY — DX: Long term (current) use of anticoagulants: Z79.01

## 2014-07-08 LAB — POCT INR: INR: 2.4

## 2014-07-13 ENCOUNTER — Telehealth: Payer: Self-pay | Admitting: Cardiology

## 2014-07-13 ENCOUNTER — Other Ambulatory Visit: Payer: Self-pay | Admitting: Cardiology

## 2014-07-13 NOTE — Telephone Encounter (Signed)
Spoke with pt son, Follow up scheduled with lori gerhardt np. Patient son voiced understanding of appt time and location

## 2014-07-13 NOTE — Telephone Encounter (Signed)
Spoke with pt wife, she reports the patients weight is up to 200 lbs. The weight gain started several weeks ago and is getting worse. He is also having nausea and has no appetite. He is SOB when walking in the home but no orthopnea. He has swelling in his feet, ankles and abdomen. His current furosemide dosage is 40 mg bid. Instructed patient to take 80 mg of furosemide twice today. He is requesting to be seen this week due to transportation issues.

## 2014-07-13 NOTE — Telephone Encounter (Signed)
See previous telephone note from today ?

## 2014-07-13 NOTE — Telephone Encounter (Signed)
Forward to [employer] RN

## 2014-07-13 NOTE — Telephone Encounter (Signed)
New problem   [name] calling stating pt need to come in office this week because of fluid retention. Please call pt's son.

## 2014-07-13 NOTE — Telephone Encounter (Signed)
[name] called in wanting to make Dr. Stanford Breed aware that the pt is beginning to retain fluid in his abdomin again and wanted to his to see Dr. Stanford Breed. He was aware that the pt had a standing appointment with a PA but he does not feel he can wait that long so he may be taking him to the hospital. Please advise   Thanks

## 2014-07-14 ENCOUNTER — Other Ambulatory Visit: Payer: Self-pay | Admitting: Nurse Practitioner

## 2014-07-14 ENCOUNTER — Encounter: Payer: Self-pay | Admitting: Nurse Practitioner

## 2014-07-14 ENCOUNTER — Inpatient Hospital Stay (HOSPITAL_COMMUNITY): Payer: Medicare Other

## 2014-07-14 ENCOUNTER — Inpatient Hospital Stay (HOSPITAL_COMMUNITY)
Admission: AD | Admit: 2014-07-14 | Discharge: 2014-07-18 | DRG: 292 | Disposition: A | Discharge status: Home / Self Care | Payer: Medicare Other | Source: Ambulatory Visit | Attending: Cardiology | Admitting: Cardiology

## 2014-07-14 ENCOUNTER — Ambulatory Visit (INDEPENDENT_AMBULATORY_CARE_PROVIDER_SITE_OTHER): Payer: Self-pay | Admitting: Nurse Practitioner

## 2014-07-14 VITALS — BP 80/48 | HR 64 | Ht 72.0 in | Wt 203.8 lb

## 2014-07-14 DIAGNOSIS — R188 Other ascites: Secondary | ICD-10-CM

## 2014-07-14 DIAGNOSIS — Z9049 Acquired absence of other specified parts of digestive tract: Secondary | ICD-10-CM | POA: Diagnosis present

## 2014-07-14 DIAGNOSIS — Z87891 Personal history of nicotine dependence: Secondary | ICD-10-CM

## 2014-07-14 DIAGNOSIS — I129 Hypertensive chronic kidney disease with stage 1 through stage 4 chronic kidney disease, or unspecified chronic kidney disease: Secondary | ICD-10-CM | POA: Diagnosis present

## 2014-07-14 DIAGNOSIS — Z888 Allergy status to other drugs, medicaments and biological substances status: Secondary | ICD-10-CM | POA: Diagnosis not present

## 2014-07-14 DIAGNOSIS — I10 Essential (primary) hypertension: Secondary | ICD-10-CM | POA: Diagnosis present

## 2014-07-14 DIAGNOSIS — I5043 Acute on chronic combined systolic (congestive) and diastolic (congestive) heart failure: Secondary | ICD-10-CM | POA: Diagnosis not present

## 2014-07-14 DIAGNOSIS — N184 Chronic kidney disease, stage 4 (severe): Secondary | ICD-10-CM | POA: Diagnosis present

## 2014-07-14 DIAGNOSIS — N189 Chronic kidney disease, unspecified: Secondary | ICD-10-CM

## 2014-07-14 DIAGNOSIS — I482 Chronic atrial fibrillation: Secondary | ICD-10-CM

## 2014-07-14 DIAGNOSIS — F411 Generalized anxiety disorder: Secondary | ICD-10-CM | POA: Diagnosis present

## 2014-07-14 DIAGNOSIS — I4821 Permanent atrial fibrillation: Secondary | ICD-10-CM | POA: Diagnosis present

## 2014-07-14 DIAGNOSIS — I252 Old myocardial infarction: Secondary | ICD-10-CM

## 2014-07-14 DIAGNOSIS — I2510 Atherosclerotic heart disease of native coronary artery without angina pectoris: Secondary | ICD-10-CM | POA: Diagnosis not present

## 2014-07-14 DIAGNOSIS — Z951 Presence of aortocoronary bypass graft: Secondary | ICD-10-CM

## 2014-07-14 DIAGNOSIS — Z7901 Long term (current) use of anticoagulants: Secondary | ICD-10-CM

## 2014-07-14 DIAGNOSIS — Z881 Allergy status to other antibiotic agents status: Secondary | ICD-10-CM | POA: Diagnosis not present

## 2014-07-14 DIAGNOSIS — I255 Ischemic cardiomyopathy: Secondary | ICD-10-CM | POA: Diagnosis present

## 2014-07-14 DIAGNOSIS — I509 Heart failure, unspecified: Secondary | ICD-10-CM

## 2014-07-14 DIAGNOSIS — K219 Gastro-esophageal reflux disease without esophagitis: Secondary | ICD-10-CM | POA: Diagnosis present

## 2014-07-14 DIAGNOSIS — E785 Hyperlipidemia, unspecified: Secondary | ICD-10-CM | POA: Diagnosis present

## 2014-07-14 DIAGNOSIS — K869 Disease of pancreas, unspecified: Secondary | ICD-10-CM | POA: Diagnosis present

## 2014-07-14 DIAGNOSIS — I5042 Chronic combined systolic (congestive) and diastolic (congestive) heart failure: Secondary | ICD-10-CM

## 2014-07-14 DIAGNOSIS — Z882 Allergy status to sulfonamides status: Secondary | ICD-10-CM | POA: Diagnosis not present

## 2014-07-14 DIAGNOSIS — D649 Anemia, unspecified: Secondary | ICD-10-CM

## 2014-07-14 DIAGNOSIS — I071 Rheumatic tricuspid insufficiency: Secondary | ICD-10-CM | POA: Diagnosis present

## 2014-07-14 DIAGNOSIS — I481 Persistent atrial fibrillation: Secondary | ICD-10-CM | POA: Diagnosis present

## 2014-07-14 DIAGNOSIS — I38 Endocarditis, valve unspecified: Secondary | ICD-10-CM | POA: Diagnosis present

## 2014-07-14 DIAGNOSIS — I50810 Right heart failure, unspecified: Secondary | ICD-10-CM

## 2014-07-14 DIAGNOSIS — N179 Acute kidney failure, unspecified: Secondary | ICD-10-CM | POA: Diagnosis not present

## 2014-07-14 DIAGNOSIS — I447 Left bundle-branch block, unspecified: Secondary | ICD-10-CM | POA: Diagnosis present

## 2014-07-14 DIAGNOSIS — R0602 Shortness of breath: Secondary | ICD-10-CM

## 2014-07-14 DIAGNOSIS — D509 Iron deficiency anemia, unspecified: Secondary | ICD-10-CM | POA: Diagnosis present

## 2014-07-14 HISTORY — DX: Atherosclerotic heart disease of native coronary artery without angina pectoris: I25.10

## 2014-07-14 HISTORY — DX: Acute on chronic combined systolic (congestive) and diastolic (congestive) heart failure: I50.43

## 2014-07-14 LAB — CBC WITH DIFFERENTIAL/PLATELET
Basophils Absolute: 0 10*3/uL (ref 0.0–0.1)
Basophils Relative: 0 % (ref 0–1)
Eosinophils Absolute: 0.1 10*3/uL (ref 0.0–0.7)
Eosinophils Relative: 4 % (ref 0–5)
HCT: 23.9 % — ABNORMAL LOW (ref 39.0–52.0)
Hemoglobin: 7.9 g/dL — ABNORMAL LOW (ref 13.0–17.0)
Lymphocytes Relative: 23 % (ref 12–46)
Lymphs Abs: 0.7 10*3/uL (ref 0.7–4.0)
MCH: 25.3 pg — ABNORMAL LOW (ref 26.0–34.0)
MCHC: 33.1 g/dL (ref 30.0–36.0)
MCV: 76.6 fL — ABNORMAL LOW (ref 78.0–100.0)
Monocytes Absolute: 0.3 10*3/uL (ref 0.1–1.0)
Monocytes Relative: 9 % (ref 3–12)
Neutro Abs: 2 10*3/uL (ref 1.7–7.7)
Neutrophils Relative %: 64 % (ref 43–77)
Platelets: 143 10*3/uL — ABNORMAL LOW (ref 150–400)
RBC: 3.12 MIL/uL — ABNORMAL LOW (ref 4.22–5.81)
RDW: 17.6 % — ABNORMAL HIGH (ref 11.5–15.5)
WBC: 3.1 10*3/uL — ABNORMAL LOW (ref 4.0–10.5)

## 2014-07-14 LAB — MAGNESIUM: Magnesium: 2.9 mg/dL — ABNORMAL HIGH (ref 1.7–2.4)

## 2014-07-14 LAB — PROTIME-INR
INR: 2.86 — ABNORMAL HIGH (ref 0.00–1.49)
Prothrombin Time: 30.2 seconds — ABNORMAL HIGH (ref 11.6–15.2)

## 2014-07-14 LAB — COMPREHENSIVE METABOLIC PANEL
ALT: 10 U/L — ABNORMAL LOW (ref 17–63)
AST: 22 U/L (ref 15–41)
Albumin: 3.3 g/dL — ABNORMAL LOW (ref 3.5–5.0)
Alkaline Phosphatase: 34 U/L — ABNORMAL LOW (ref 38–126)
Anion gap: 10 (ref 5–15)
BUN: 137 mg/dL — ABNORMAL HIGH (ref 6–20)
CO2: 28 mmol/L (ref 22–32)
Calcium: 9.2 mg/dL (ref 8.9–10.3)
Chloride: 96 mmol/L — ABNORMAL LOW (ref 101–111)
Creatinine, Ser: 6.15 mg/dL — ABNORMAL HIGH (ref 0.61–1.24)
GFR calc Af Amer: 9 mL/min — ABNORMAL LOW (ref >60)
GFR calc non Af Amer: 7 mL/min — ABNORMAL LOW (ref >60)
Glucose, Bld: 107 mg/dL — ABNORMAL HIGH (ref 65–99)
Potassium: 4.9 mmol/L (ref 3.5–5.1)
Sodium: 134 mmol/L — ABNORMAL LOW (ref 135–145)
Total Bilirubin: 0.4 mg/dL (ref 0.3–1.2)
Total Protein: 6.1 g/dL — ABNORMAL LOW (ref 6.5–8.1)

## 2014-07-14 LAB — BRAIN NATRIURETIC PEPTIDE: B Natriuretic Peptide: 574.2 pg/mL — ABNORMAL HIGH (ref 0.0–100.0)

## 2014-07-14 LAB — APTT: aPTT: 50 seconds — ABNORMAL HIGH (ref 24–37)

## 2014-07-14 LAB — TROPONIN I
Troponin I: 0.03 ng/mL (ref <0.031)
Troponin I: 0.03 ng/mL (ref <0.031)

## 2014-07-14 LAB — TSH: TSH: 2.428 u[IU]/mL (ref 0.350–4.500)

## 2014-07-14 MED ORDER — ENALAPRIL MALEATE 10 MG PO TABS
10.0000 mg | ORAL_TABLET | Freq: Two times a day (BID) | ORAL | Status: DC
  Administered 2014-07-14: 10 mg via ORAL
  Filled 2014-07-14 (×4): qty 1

## 2014-07-14 MED ORDER — SODIUM CHLORIDE 0.9 % IJ SOLN: 3.0000 mL | INTRAMUSCULAR | Status: DC | PRN

## 2014-07-14 MED ORDER — FERROUS SULFATE 325 (65 FE) MG PO TABS
325.0000 mg | ORAL_TABLET | Freq: Every day | ORAL | Status: DC
  Filled 2014-07-14 (×2): qty 1

## 2014-07-14 MED ORDER — CARVEDILOL 6.25 MG PO TABS
6.2500 mg | ORAL_TABLET | Freq: Two times a day (BID) | ORAL | Status: DC
  Administered 2014-07-14: 6.25 mg via ORAL
  Filled 2014-07-14 (×4): qty 1

## 2014-07-14 MED ORDER — SODIUM CHLORIDE 0.9 % IJ SOLN
3.0000 mL | Freq: Two times a day (BID) | INTRAMUSCULAR | Status: DC
  Administered 2014-07-14 – 2014-07-18 (×9): 3 mL via INTRAVENOUS

## 2014-07-14 MED ORDER — ONDANSETRON HCL 4 MG/2ML IJ SOLN
4.0000 mg | Freq: Four times a day (QID) | INTRAMUSCULAR | Status: DC | PRN
  Administered 2014-07-14 – 2014-07-18 (×3): 4 mg via INTRAVENOUS
  Filled 2014-07-14 (×3): qty 2

## 2014-07-14 MED ORDER — WARFARIN 1.25 MG HALF TABLET
1.2500 mg | ORAL_TABLET | Freq: Once | ORAL | Status: DC
  Filled 2014-07-14 (×2): qty 1

## 2014-07-14 MED ORDER — ATORVASTATIN CALCIUM 20 MG PO TABS
20.0000 mg | ORAL_TABLET | Freq: Every day | ORAL | Status: DC
  Administered 2014-07-14 – 2014-07-17 (×4): 20 mg via ORAL
  Filled 2014-07-14 (×6): qty 1

## 2014-07-14 MED ORDER — FUROSEMIDE 10 MG/ML IJ SOLN
60.0000 mg | Freq: Two times a day (BID) | INTRAMUSCULAR | Status: DC
  Administered 2014-07-14 – 2014-07-15 (×3): 60 mg via INTRAVENOUS
  Filled 2014-07-14 (×4): qty 6

## 2014-07-14 MED ORDER — SODIUM CHLORIDE 0.9 % IV SOLN: 250.0000 mL | INTRAVENOUS | Status: DC | PRN

## 2014-07-14 MED ORDER — WARFARIN - PHARMACIST DOSING INPATIENT: Freq: Every day | Status: DC

## 2014-07-14 MED ORDER — FOLIC ACID 1 MG PO TABS
1.0000 mg | ORAL_TABLET | Freq: Every day | ORAL | Status: DC
  Administered 2014-07-14 – 2014-07-18 (×5): 1 mg via ORAL
  Filled 2014-07-14 (×5): qty 1

## 2014-07-14 MED ORDER — INSULIN ASPART 100 UNIT/ML ~~LOC~~ SOLN: 5.0000 [IU] | Freq: Three times a day (TID) | SUBCUTANEOUS | Status: DC

## 2014-07-14 MED ORDER — CLONAZEPAM 0.5 MG PO TABS
1.0000 mg | ORAL_TABLET | Freq: Two times a day (BID) | ORAL | Status: DC | PRN
  Administered 2014-07-14 – 2014-07-16 (×4): 1 mg via ORAL
  Filled 2014-07-14 (×4): qty 2

## 2014-07-14 NOTE — Progress Notes (Signed)
ANTICOAGULATION CONSULT NOTE - Initial Consult  Pharmacy Consult for Coumadin Indication: atrial fibrillation  Allergies  Allergen Reactions  . Amoxicillin     REACTION: ha/nausea  . Diltiazem Hcl     REACTION: low bp  . Hydrocodone-Acetaminophen     REACTION: ha  . Niacin     REACTION: ha/nausea  . Niacin-Lovastatin Er     REACTION: ha/nausea  . Paroxetine     REACTION: ha/disoriented  . Propoxyphene N-Acetaminophen     REACTION: headache  . Pseudoephedrine     REACTION: stopped urine flow  . Sulfonamide Derivatives     REACTION: ha/nausea  . Tolterodine Tartrate     REACTION: bladder stopped    Patient Measurements: Height: 6' (182.9 cm) Weight: 200 lb 4.8 oz (90.855 kg) IBW/kg (Calculated) : 77.6 Heparin Dosing Weight: n/a  Vital Signs: Temp: 97.3 F (36.3 C) (05/17 1404) Temp Source: Oral (05/17 1404) BP: 97/43 mmHg (05/17 1636) Pulse Rate: 65 (05/17 1636)  Labs:  Recent Labs  07/14/14 1550  HGB 7.9*  HCT 23.9*  PLT 143*  APTT 50*  LABPROT 30.2*  INR 2.86*  CREATININE 6.15*  TROPONINI 0.03    Estimated Creatinine Clearance: 10 mL/min (by C-G formula based on Cr of 6.15).   Medical History: Past Medical History  Diagnosis Date  . Neurogenic bladder     has had bladder catheter changes at the urology office every 12 weeks  . Anemia   . Hyperlipidemia   . Hypertension   . Heart attack     1983  . Chronic systolic heart failure     a. prior EF 20-30%;  b. Echo 7/09: EF 35-40%, basal inferoposterior HK, mild AI, mild to moderate MR, moderate to marked LAE, mild to moderate TR, mildly increased PASP, moderate RAE;   c. Echo 01/2012:   mild LVH, EF 50%, Tr AI, mild MR, severe LAE, mod to severe RAE, mod to severe RVE, mod reduced RVSF, mod to severe TR, PASP 46  . Arrhythmia     afib  . Warfarin anticoagulation   . GERD (gastroesophageal reflux disease)   . Anxiety   . Renal failure     Assessment: 81 yo male admitted with acute on chronic  HF.  On chronic Coumadin for afib.  INR is therapeutic today (2.86) on home Coumadin dose of 1.25 mg daily except 2.5 mg on Monday (recent change 5/11 as outpatient).  Spoke to patient, has not yet had dose today.  Goal of Therapy:  INR 2-3 Monitor platelets by anticoagulation protocol: Yes   Plan:  1. Coumadin 1.25 mg x 1 tonight. 2. Daily PT/INR.  Uvaldo Rising, BCPS  Clinical Pharmacist Pager [phone]  07/14/2014 5:46 PM

## 2014-07-14 NOTE — Progress Notes (Signed)
Notified on-call doctor- Dr. Philbert Riser of low blood pressure of 86/46 HR-70, in regards to administering IV Lasix 60mg . Orders given to continue to administer medication for blood pressure is the patient's baseline and also patient stated that it can run in the in the systolically in the [id number].Will do as ordered and continue to monitor patient to end of shift.

## 2014-07-14 NOTE — Progress Notes (Signed)
  Echocardiogram 2D Echocardiogram has been performed.  Gary [name] 07/14/2014, 3:42 PM

## 2014-07-14 NOTE — Patient Instructions (Signed)
We are going to admit you to the hospital today.

## 2014-07-14 NOTE — Progress Notes (Addendum)
CARDIOLOGY OFFICE NOTE  Date:  07/14/2014    Gary Lope Sr. Date of Birth: 1929/04/06 Medical Record [id number]  PCP:  Gary Koyanagi, DO  Cardiologist:  Gary [name]    Chief Complaint  Patient presents with  . Congestive Heart Failure    Work in visit - seen for Dr. Stanford [name].     History of Present Illness: Gary [name]. is a 81 y.o. male who presents today for a work in visit. Seen for Dr. Stanford [name]. He has not been here in almost one year.   He has a hx of CAD, status post CABG in 1995, ischemic cardiomyopathy, systolic CHF, permanent atrial fibrillation, LBBB, HTN, HL, & CKD. Other issues as noted below. Last echocardiogram in April 2015 showed an ejection fraction of 50-55%. There was severe left atrial enlargement, severe right ventricular enlargement, severe right atrial enlargement, severe tricuspid regurgitation, moderate PI, moderate mitral regurgitation and mild aortic insufficiency. He is on chronic coumadin anticoagulation for his chronic AF.   Admitted in April of 2015 with ascites requiring paracentesis. This was felt possibly secondary to right heart failure and valvular heart disease. Patient was treated with diuresis. Note abdominal CT showed a 3.6 cm complex cystic structure in the pancreas. Patient seen in followup by GI and further workup felt not indicated as it pancreatic cancer demonstrated he would not be a candidate for surgery.   In the ER last month with obstructed foley - noted urine culture with [facility]   Phone call yesterday - "[name] called in wanting to make Dr. Stanford [name] aware that the pt is beginning to retain fluid in his abdomin again and wanted to his to see Dr. Stanford [name]. He was aware that the pt had a standing appointment with a PA but he does not feel he can wait that long so he may be taking him to the hospital. Please advise"  Thus added to my schedule for today.  Comes in today. Here with his son. In a wheelchair. Looks poorly.  Short of breath. More back pain. More abdominal swelling. Has his foley in - this is chronic. Son notes that [name] tends to do a lot of self medicating. His medicines are unclear. No chest pain. More sedentary.   Past Medical History  Diagnosis Date  . Neurogenic bladder     has had bladder catheter changes at the urology office every 12 weeks  . Anemia   . Hyperlipidemia   . Hypertension   . Heart attack     1983  . Chronic systolic heart failure     a. prior EF 20-30%;  b. Echo 7/09: EF 35-40%, basal inferoposterior HK, mild AI, mild to moderate MR, moderate to marked LAE, mild to moderate TR, mildly increased PASP, moderate RAE;   c. Echo 01/2012:   mild LVH, EF 50%, Tr AI, mild MR, severe LAE, mod to severe RAE, mod to severe RVE, mod reduced RVSF, mod to severe TR, PASP 46  . Arrhythmia     afib  . Warfarin anticoagulation   . GERD (gastroesophageal reflux disease)   . Anxiety   . Renal failure     Past Surgical History  Procedure Laterality Date  . Appendectomy    . Cholecystectomy      1971  . Coronary artery bypass graft      1995     Medications: Current Outpatient Prescriptions  Medication Sig Dispense Refill  . acetaminophen (TYLENOL) 500 MG tablet Take 1,000  mg by mouth 3 (three) times daily.    Marland Kitchen atorvastatin (LIPITOR) 20 MG tablet TAKE 1 TABLET BY MOUTH DAILY 30 tablet 0  . carvedilol (COREG) 12.5 MG tablet Take 1 tablet (12.5 mg total) by mouth 2 (two) times daily with a meal. 180 tablet 1  . clonazePAM (KLONOPIN) 1 MG tablet TAKE 1 TABLET BY MOUTH TWICE DAILY AS NEEDED FOR ANXIETY 60 tablet 0  . enalapril (VASOTEC) 20 MG tablet Take 1 tablet (20 mg total) by mouth 2 (two) times daily. 180 tablet 1  . fenofibrate 54 MG tablet TAKE 1 TABLET BY MOUTH TWICE DAILY 180 tablet 3  . ferrous sulfate 325 (65 FE) MG tablet Take 325 mg by mouth daily with breakfast.     . folic acid (FOLVITE) 1 MG tablet Take 1 mg by mouth daily.      . furosemide (LASIX) 40 MG  tablet Take 1 tablet (40 mg total) by mouth 2 (two) times daily. 360 tablet 2  . Glucos-Chondroit-Hyaluron-MSM (GLUCOSAMINE CHONDROITIN JOINT PO) Take 1 tablet by mouth 2 (two) times daily.    Marland Kitchen lidocaine (XYLOCAINE) 2 % jelly Place 1 application into the urethra daily as needed (pain).    . Meth-Hyo-M Bl-Na Phos-Ph Sal (URIBEL) 118 MG CAPS TAKE 1 TO 4 CAPSULE BY MOUTH EVERY DAY AS DIRECTED 120 capsule 5  . Misc Natural Products (OSTEO BI-FLEX JOINT SHIELD PO) Take 650 mg by mouth 2 (two) times daily.    . Multiple Vitamin (MULTIVITAMIN) capsule Take 1 capsule by mouth daily.      . ondansetron (ZOFRAN) 8 MG tablet TAKE 1 TABLET BY MOUTH EVERY 12 HOURS AS NEEDED FOR NAUSEA 30 tablet 0  . ondansetron (ZOFRAN) 8 MG tablet TAKE 1 TABLET BY MOUTH EVERY 12 HOURS AS NEEDED FOR NAUSEA 30 tablet 0  . traZODone (DESYREL) 50 MG tablet TAKE 1/2 TO 1 TABLET BY MOUTH EVERY NIGHT AT BEDTIME AS NEEDED FOR SLEEP 30 tablet 0  . triamcinolone cream (KENALOG) 0.1 % APPLY EXTERNALLY TO THE AFFECTED AREA TWICE DAILY AS NEEDED DO NOT USE ON FACE, GROIN, OR UNDERARMS 454 g 0  . VITAMIN D, CHOLECALCIFEROL, PO Take 1 tablet by mouth daily.    Marland Kitchen warfarin (COUMADIN) 2.5 MG tablet TAKE 1 TABLET BY MOUTH DAILY; EXCEPT TAKE 1/2 TABLET ON WEDNESDAY 90 tablet 0  . warfarin (COUMADIN) 5 MG tablet TAKE 1 TABLET BY MOUTH EVERY DAY AS DIRECTED 90 tablet 0   No current facility-administered medications for this visit.    Allergies: Allergies  Allergen Reactions  . Amoxicillin     REACTION: ha/nausea  . Diltiazem Hcl     REACTION: low bp  . Hydrocodone-Acetaminophen     REACTION: ha  . Niacin     REACTION: ha/nausea  . Niacin-Lovastatin Er     REACTION: ha/nausea  . Paroxetine     REACTION: ha/disoriented  . Propoxyphene N-Acetaminophen     REACTION: headache  . Pseudoephedrine     REACTION: stopped urine flow  . Sulfonamide Derivatives     REACTION: ha/nausea  . Tolterodine Tartrate     REACTION: bladder stopped      Social History: The patient  reports that he has quit smoking. He does not have any smokeless tobacco history on file. He reports that he does not drink alcohol or use illicit drugs.   Family History: The patient's family history includes Breast cancer in his sister; Cancer in his mother; Heart attack in his father.   Review  of Systems: Please see the history of present illness.   Otherwise, the review of systems is positive for DOE, fatigue and swelling.  He has been having diarrhea. Chronic back pain. Easy bruising.  All other systems are reviewed and negative.   Physical Exam: VS:  BP 80/48 mmHg  Pulse 64  Ht 6' (1.829 m)  Wt 203 lb 12.8 oz (92.443 kg)  BMI 27.63 kg/m2 .  BMI Body mass index is 27.63 kg/(m^2).  Wt Readings from Last 3 Encounters:  07/14/14 203 lb 12.8 oz (92.443 kg)  06/19/14 195 lb (88.451 kg)  03/13/14 190 lb (86.183 kg)    General: Alert. Color is very pale. No acute distress.   HEENT: Normal. Neck: Supple, + JVD noted.  Cardiac: Irregular irregular rhythm. His rate is ok. 1+ edema in his legs.  Respiratory:  Lungs are fairly clear to auscultation bilaterally.   GI: Very distended with fluid. Has sacral edema as well.  MS: No deformity or atrophy. Gait not tested.  Skin: Warm and dry. Color is quite sallow. Neuro:  Strength and sensation are intact and no gross focal deficits noted.  Psych: Alert, appropriate and with normal affect.   LABORATORY DATA:  EKG:  EKG is not ordered today.   Lab Results  Component Value Date   WBC 4.2 12/12/2013   HGB 9.5* 12/12/2013   HCT 30.1* 12/12/2013   PLT 158 12/12/2013   GLUCOSE 129* 12/12/2013   CHOL 62 05/12/2013   TRIG 41.0 05/12/2013   HDL 26.90* 05/12/2013   LDLCALC 27 05/12/2013   ALT 6 12/12/2013   AST 15 12/12/2013   NA 138 12/12/2013   K 4.8 12/12/2013   CL 96 12/12/2013   CREATININE 4.00* 12/12/2013   BUN 97* 12/12/2013   CO2 30 12/12/2013   TSH 0.99 03/31/2010   PSA 13.12* 10/20/2011    INR 2.4 07/08/2014    BNP (last 3 results) No results for input(s): BNP in the last 8760 hours.  ProBNP (last 3 results) No results for input(s): PROBNP in the last 8760 hours.   Other Studies Reviewed Today:  Echo Study Conclusions from 05/2013  - Left ventricle: The cavity size was moderately dilated. Wall thickness was increased in a pattern of mild LVH. Systolic function was normal. The estimated ejection fraction was in the range of 50% to 55%. Regional wall motion abnormalities cannot be excluded. - Ventricular septum: Septal motion showed paradox. The contour showed systolic flattening. - Aortic valve: Mild regurgitation. - Mitral valve: Moderate regurgitation. - Left atrium: The atrium was severely dilated. - Right ventricle: The cavity size was severely dilated. Systolic function was moderately reduced. - Right atrium: The atrium was severely dilated. - Atrial septum: No defect or patent foramen ovale was identified. - Tricuspid valve: Severe regurgitation. - Pulmonic valve: Moderate regurgitation. - Pulmonary arteries: PA peak pressure: 47mm Hg (S).  Assessment/Plan: 1. Acute on chronic combined systolic and diastolic HF - looks quite poor. BP low - probably low output. Needs admission for diuresis. Needs medicines adjusted. Will cut the Coreg back. Will cut the ACE back as well.   2. Ascites - may need GI to see again. Has had paracentesis in the past.   3. CAD - no active chest pain noted.   4. Progressive CKD - last creatinine noted to be 4 in EPIC. A scanned in lab from January was 2.2. Apparently was to get nephrology consult - does not look like this has been done. May have to  be readdressed and proceed during this admission.   5. Anemia - needs rechecking.   6. Possible pancreatic lesion - son says this did not turn out to be anything significant.   Discussed with Dr. Lovena Le who has seen the patient as well. We both favor admission for  diuresis. Patient currently does not wish to have invasive procedures. We have talked about code status - this will need to be readdressed - for now he is a full code. May need to palliative care measures based on his response to therapy.    Current medicines are reviewed with the patient today.  The patient does not have concerns regarding medicines other than what has been noted above.  The following changes have been made:  See above.  Labs/ tests ordered today include:   No orders of the defined types were placed in this encounter.     Disposition:   Admission to [facility] - CHF unit.   Patient is agreeable to this plan and will call if any problems develop in the interim.   Signed: Burtis Junes, RN, ANP-C 07/14/2014 12:24 PM  [facility] [address] [place],   [address] Phone: [phone] Fax: [phone]

## 2014-07-14 NOTE — H&P (Signed)
[place]  07/14/2014 11:45 AM  Office Visit  MRN:  [id number]   Description: Male DOB: 1929/05/02  Provider: Burtis Junes, NP  Department: [facility]       Vital Signs  Most recent update: 07/14/2014 12:23 PM by Tamsen Snider    BP Pulse Ht Wt BMI    80/48 mmHg 64 6' (1.829 m) 203 lb 12.8 oz (92.443 kg) 27.63 kg/m2    Vitals History     Progress Notes      Burtis Junes, NP at 07/14/2014 12:03 PM     Status: Addendum       Expand All Collapse All       CARDIOLOGY OFFICE NOTE  Date: 07/14/2014    Gary Lope Sr. Date of Birth: 1930/01/06 Medical Record [id number]  PCP: Garnet Koyanagi, DO Cardiologist: Stanford Breed   Chief Complaint  Patient presents with  . Congestive Heart Failure    Work in visit - seen for Dr. Stanford Breed.     History of Present Illness: Edwin Dudek. is a 81 y.o. male who presents today for a work in visit. Seen for Dr. Stanford Breed. He has not been here in almost one year.   He has a hx of CAD, status post CABG in 1995, ischemic cardiomyopathy, systolic CHF, permanent atrial fibrillation, LBBB, HTN, HL, & CKD. Other issues as noted below. Last echocardiogram in April 2015 showed an ejection fraction of 50-55%. There was severe left atrial enlargement, severe right ventricular enlargement, severe right atrial enlargement, severe tricuspid regurgitation, moderate PI, moderate mitral regurgitation and mild aortic insufficiency. He is on chronic coumadin anticoagulation for his chronic AF.   Admitted in April of 2015 with ascites requiring paracentesis. This was felt possibly secondary to right heart failure and valvular heart disease. Patient was treated with diuresis. Note abdominal CT showed a 3.6 cm complex cystic structure in the pancreas. Patient seen in followup by GI and further workup felt not indicated as it pancreatic cancer demonstrated he would not be a candidate for surgery.   In  the ER last month with obstructed foley - noted urine culture with [facility].   Phone call yesterday - "[name] called in wanting to make Dr. Stanford Breed aware that the pt is beginning to retain fluid in his abdomin again and wanted to his to see Dr. Stanford Breed. He was aware that the pt had a standing appointment with a PA but he does not feel he can wait that long so he may be taking him to the hospital. Please advise"  Thus added to my schedule for today.  Comes in today. Here with his son. In a wheelchair. Looks poorly. Short of breath. More back pain. More abdominal swelling. Has his foley in - this is chronic. Son notes that Mr. Mckinzie tends to do a lot of self medicating. His medicines are unclear. No chest pain. More sedentary.   Past Medical History  Diagnosis Date  . Neurogenic bladder     has had bladder catheter changes at the urology office every 12 weeks  . Anemia   . Hyperlipidemia   . Hypertension   . Heart attack     1983  . Chronic systolic heart failure     a. prior EF 20-30%; b. Echo 7/09: EF 35-40%, basal inferoposterior HK, mild AI, mild to moderate MR, moderate to marked LAE, mild to moderate TR, mildly increased PASP, moderate RAE; c. Echo 01/2012: mild LVH, EF 50%, Tr  AI, mild MR, severe LAE, mod to severe RAE, mod to severe RVE, mod reduced RVSF, mod to severe TR, PASP 46  . Arrhythmia     afib  . Warfarin anticoagulation   . GERD (gastroesophageal reflux disease)   . Anxiety   . Renal failure     Past Surgical History  Procedure Laterality Date  . Appendectomy    . Cholecystectomy      1971  . Coronary artery bypass graft      1995     Medications: Current Outpatient Prescriptions  Medication Sig Dispense Refill  . acetaminophen (TYLENOL) 500 MG tablet Take 1,000 mg by mouth 3 (three) times daily.    Marland Kitchen atorvastatin (LIPITOR) 20 MG tablet TAKE 1 TABLET BY MOUTH DAILY 30  tablet 0  . carvedilol (COREG) 12.5 MG tablet Take 1 tablet (12.5 mg total) by mouth 2 (two) times daily with a meal. 180 tablet 1  . clonazePAM (KLONOPIN) 1 MG tablet TAKE 1 TABLET BY MOUTH TWICE DAILY AS NEEDED FOR ANXIETY 60 tablet 0  . enalapril (VASOTEC) 20 MG tablet Take 1 tablet (20 mg total) by mouth 2 (two) times daily. 180 tablet 1  . fenofibrate 54 MG tablet TAKE 1 TABLET BY MOUTH TWICE DAILY 180 tablet 3  . ferrous sulfate 325 (65 FE) MG tablet Take 325 mg by mouth daily with breakfast.     . folic acid (FOLVITE) 1 MG tablet Take 1 mg by mouth daily.     . furosemide (LASIX) 40 MG tablet Take 1 tablet (40 mg total) by mouth 2 (two) times daily. 360 tablet 2  . Glucos-Chondroit-Hyaluron-MSM (GLUCOSAMINE CHONDROITIN JOINT PO) Take 1 tablet by mouth 2 (two) times daily.    Marland Kitchen lidocaine (XYLOCAINE) 2 % jelly Place 1 application into the urethra daily as needed (pain).    . Meth-Hyo-M Bl-Na Phos-Ph Sal (URIBEL) 118 MG CAPS TAKE 1 TO 4 CAPSULE BY MOUTH EVERY DAY AS DIRECTED 120 capsule 5  . Misc Natural Products (OSTEO BI-FLEX JOINT SHIELD PO) Take 650 mg by mouth 2 (two) times daily.    . Multiple Vitamin (MULTIVITAMIN) capsule Take 1 capsule by mouth daily.     . ondansetron (ZOFRAN) 8 MG tablet TAKE 1 TABLET BY MOUTH EVERY 12 HOURS AS NEEDED FOR NAUSEA 30 tablet 0  . ondansetron (ZOFRAN) 8 MG tablet TAKE 1 TABLET BY MOUTH EVERY 12 HOURS AS NEEDED FOR NAUSEA 30 tablet 0  . traZODone (DESYREL) 50 MG tablet TAKE 1/2 TO 1 TABLET BY MOUTH EVERY NIGHT AT BEDTIME AS NEEDED FOR SLEEP 30 tablet 0  . triamcinolone cream (KENALOG) 0.1 % APPLY EXTERNALLY TO THE AFFECTED AREA TWICE DAILY AS NEEDED DO NOT USE ON FACE, GROIN, OR UNDERARMS 454 g 0  . VITAMIN D, CHOLECALCIFEROL, PO Take 1 tablet by mouth daily.    Marland Kitchen warfarin (COUMADIN) 2.5 MG tablet TAKE 1 TABLET BY MOUTH DAILY; EXCEPT TAKE 1/2 TABLET ON WEDNESDAY 90  tablet 0  . warfarin (COUMADIN) 5 MG tablet TAKE 1 TABLET BY MOUTH EVERY DAY AS DIRECTED 90 tablet 0   No current facility-administered medications for this visit.    Allergies: Allergies  Allergen Reactions  . Amoxicillin     REACTION: ha/nausea  . Diltiazem Hcl     REACTION: low bp  . Hydrocodone-Acetaminophen     REACTION: ha  . Niacin     REACTION: ha/nausea  . Niacin-Lovastatin Er     REACTION: ha/nausea  . Paroxetine  REACTION: ha/disoriented  . Propoxyphene N-Acetaminophen     REACTION: headache  . Pseudoephedrine     REACTION: stopped urine flow  . Sulfonamide Derivatives     REACTION: ha/nausea  . Tolterodine Tartrate     REACTION: bladder stopped    Social History: The patient  reports that he has quit smoking. He does not have any smokeless tobacco history on file. He reports that he does not drink alcohol or use illicit drugs.  Family History: The patient's family history includes Breast cancer in his sister; Cancer in his mother; Heart attack in his father.   Review of Systems: Please see the history of present illness. Otherwise, the review of systems is positive for DOE, fatigue and swelling. He has been having diarrhea. Chronic back pain. Easy bruising. All other systems are reviewed and negative.   Physical Exam: VS: BP 80/48 mmHg  Pulse 64  Ht 6' (1.829 m)  Wt 203 lb 12.8 oz (92.443 kg)  BMI 27.63 kg/m2 . BMI Body mass index is 27.63 kg/(m^2).  Wt Readings from Last 3 Encounters:  07/14/14 203 lb 12.8 oz (92.443 kg)  06/19/14 195 lb (88.451 kg)  03/13/14 190 lb (86.183 kg)    General: Alert. Color is very pale. No acute distress.  HEENT: Normal.  Neck: Supple, + JVD noted.  Cardiac: Irregular irregular rhythm. His rate is ok. 1+ edema in his legs.  Respiratory: Lungs are fairly clear to auscultation bilaterally.  GI: Very distended with fluid.  Has sacral edema as well.  MS: No deformity or atrophy. Gait not tested.  Skin: Warm and dry. Color is quite sallow. Neuro: Strength and sensation are intact and no gross focal deficits noted.  Psych: Alert, appropriate and with normal affect.   LABORATORY DATA:  EKG: EKG is not ordered today.    Recent Labs    Lab Results  Component Value Date   WBC 4.2 12/12/2013   HGB 9.5* 12/12/2013   HCT 30.1* 12/12/2013   PLT 158 12/12/2013   GLUCOSE 129* 12/12/2013   CHOL 62 05/12/2013   TRIG 41.0 05/12/2013   HDL 26.90* 05/12/2013   LDLCALC 27 05/12/2013   ALT 6 12/12/2013   AST 15 12/12/2013   NA 138 12/12/2013   K 4.8 12/12/2013   CL 96 12/12/2013   CREATININE 4.00* 12/12/2013   BUN 97* 12/12/2013   CO2 30 12/12/2013   TSH 0.99 03/31/2010   PSA 13.12* 10/20/2011   INR 2.4 07/08/2014      BNP (last 3 results)  Recent Labs (within last 365 days)    No results for input(s): BNP in the last 8760 hours.    ProBNP (last 3 results)  Recent Labs (within last 365 days)    No results for input(s): PROBNP in the last 8760 hours.     Other Studies Reviewed Today:  Echo Study Conclusions from 05/2013  - Left ventricle: The cavity size was moderately dilated. Wall thickness was increased in a pattern of mild LVH. Systolic function was normal. The estimated ejection fraction was in the range of 50% to 55%. Regional wall motion abnormalities cannot be excluded. - Ventricular septum: Septal motion showed paradox. The contour showed systolic flattening. - Aortic valve: Mild regurgitation. - Mitral valve: Moderate regurgitation. - Left atrium: The atrium was severely dilated. - Right ventricle: The cavity size was severely dilated. Systolic function was moderately reduced. - Right atrium: The atrium was severely dilated. - Atrial septum: No defect or patent foramen ovale  was identified. - Tricuspid valve: Severe regurgitation. - Pulmonic valve: Moderate regurgitation. - Pulmonary arteries: PA peak pressure: 78mm Hg (S).  Assessment/Plan: 1. Acute on chronic combined systolic and diastolic HF - looks quite poor. BP low - probably low output. Needs admission for diuresis. Needs medicines adjusted. Will cut the Coreg back. Will cut the ACE back as well.   2. Ascites - may need GI to see again. Has had paracentesis in the past.   3. CAD - no active chest pain noted.   4. Progressive CKD - last creatinine noted to be 4 in EPIC. A scanned in lab from January was 2.2. Apparently was to get nephrology consult - does not look like this has been done. May have to be readdressed and proceed during this admission.   5. Anemia - needs rechecking.   6. Possible pancreatic lesion - son says this did not turn out to be anything significant.   Discussed with Dr. Lovena Le who has seen the patient as well. We both favor admission for diuresis. Patient currently does not wish to have invasive procedures. We have talked about code status - this will need to be readdressed - for now he is a full code. May need to palliative care measures based on his response to therapy.    Current medicines are reviewed with the patient today. The patient does not have concerns regarding medicines other than what has been noted above.  The following changes have been made: See above.  Labs/ tests ordered today include:   No orders of the defined types were placed in this encounter.    Disposition: Admission to [facility] - CHF unit.   Patient is agreeable to this plan and will call if any problems develop in the interim.   Signed: Burtis Junes, RN, ANP-C 07/14/2014 12:24 PM  [facility] [address] [place], [place] [address] Phone: [phone] Fax: [phone]              Revision History       Date/Time User  Action    > 07/14/2014 1:09 PM Burtis Junes, NP Addend     07/14/2014 12:54 PM Burtis Junes, NP Sign               Referring Provider     Rosalita Chessman, DO     Diagnoses     Long-term (current) use of anticoagulants - Primary    ICD-9-CM: V58.61 ICD-10-CM: Z79.01    Permanent atrial fibrillation     ICD-9-CM: 427.31 ICD-10-CM: I48.2    Coronary artery disease involving native coronary artery of native heart without angina pectoris     ICD-9-CM: 414.01 ICD-10-CM: I25.10    Chronic combined systolic and diastolic heart failure     ICD-9-CM: 428.42 ICD-10-CM: I50.42       Reason for Visit     Congestive Heart Failure    Work in visit - seen for Dr. Stanford Breed.     Reason for Visit History        Discontinued Medications       Reason for Discontinue    aspirin 81 MG tablet Change in therapy    ciprofloxacin (CIPRO) 500 MG tablet Completed Course    enalapril (VASOTEC) 20 MG tablet Duplicate    HYDROcodone-acetaminophen (NORCO) 5-325 MG per tablet Completed Course    nystatin-triamcinolone ointment (MYCOLOG) Completed Course    traMADol (ULTRAM) 50 MG tablet Completed Course  ondansetron (ZOFRAN) 8 MG tablet Duplicate      Level of Service     LOS - NO CHARGE [NC1]      Follow-up and Disposition     Routing History       All Charges for This Encounter     Code Description Service Date Service Provider Modifiers Qty    NC1 LOS - NO CHARGE 07/14/2014 Burtis Junes, NP  1      AVS Reports     No AVS Snapshots are available for this encounter.     Patient Instructions     We are going to admit you to the hospital today.       Routing History     There are no sent or routed communications associated with this encounter.     Chart Reviewed By     [name], MD on 07/14/2014 1:10 PM    Rosalita Chessman, DO on 07/14/2014 1:46 PM      Previous Visit         Provider Department Encounter #    07/13/2014 9:15 AM Truitt Merle, NP [facility] [id number]      EP Attending  Patient seen and examined. Agree with above.  Mikle Bosworth.D.

## 2014-07-14 NOTE — Discharge Instructions (Addendum)
Information on my medicine - Coumadin   (Warfarin)  This medication education was reviewed with me or my healthcare representative as part of my discharge preparation.   Why was Coumadin prescribed for you? Coumadin was prescribed for you because you have a blood clot or a medical condition that can cause an increased risk of forming blood clots. Blood clots can cause serious health problems by blocking the flow of blood to the heart, lung, or brain. Coumadin can prevent harmful blood clots from forming. As a reminder your indication for Coumadin is:   Stroke Prevention Because Of Atrial Fibrillation  What test will check on my response to Coumadin? While on Coumadin (warfarin) you will need to have an INR test regularly to ensure that your dose is keeping you in the desired range. The INR (international normalized ratio) number is calculated from the result of the laboratory test called prothrombin time (PT).  If an INR APPOINTMENT HAS NOT ALREADY BEEN MADE FOR YOU please schedule an appointment to have this lab work done by your health care provider within 7 days. Your INR goal is usually a number between:  2 to 3 or your provider may give you a more narrow range like 2-2.5.  Ask your health care provider during an office visit what your goal INR is.  What  do you need to  know  About  COUMADIN? Take Coumadin (warfarin) exactly as prescribed by your healthcare provider about the same time each day.  DO NOT stop taking without talking to the doctor who prescribed the medication.  Stopping without other blood clot prevention medication to take the place of Coumadin may increase your risk of developing a new clot or stroke.  Get refills before you run out.  What do you do if you miss a dose? If you miss a dose, take it as soon as you remember on the same day then continue your regularly scheduled regimen the next day.  Do not take two doses of Coumadin at the same time.  Important Safety  Information A possible side effect of Coumadin (Warfarin) is an increased risk of bleeding. You should call your healthcare provider right away if you experience any of the following: ? Bleeding from an injury or your nose that does not stop. ? Unusual colored urine (red or dark brown) or unusual colored stools (red or black). ? Unusual bruising for unknown reasons. ? A serious fall or if you hit your head (even if there is no bleeding).  Some foods or medicines interact with Coumadin (warfarin) and might alter your response to warfarin. To help avoid this: ? Eat a balanced diet, maintaining a consistent amount of Vitamin K. ? Notify your provider about major diet changes you plan to make. ? Avoid alcohol or limit your intake to 1 drink for women and 2 drinks for men per day. (1 drink is 5 oz. wine, 12 oz. beer, or 1.5 oz. liquor.)  Make sure that ANY health care provider who prescribes medication for you knows that you are taking Coumadin (warfarin).  Also make sure the healthcare provider who is monitoring your Coumadin knows when you have started a new medication including herbals and non-prescription products.  Coumadin (Warfarin)  Major Drug Interactions  Increased Warfarin Effect Decreased Warfarin Effect  Alcohol (large quantities) Antibiotics (esp. Septra/Bactrim, Flagyl, Cipro) Amiodarone (Cordarone) Aspirin (ASA) Cimetidine (Tagamet) Megestrol (Megace) NSAIDs (ibuprofen, naproxen, etc.) Piroxicam (Feldene) Propafenone (Rythmol SR) Propranolol (Inderal) Isoniazid (INH) Posaconazole (Noxafil) Barbiturates (Phenobarbital) Carbamazepine (  Tegretol) Chlordiazepoxide (Librium) Cholestyramine (Questran) Griseofulvin Oral Contraceptives Rifampin Sucralfate (Carafate) Vitamin K   Coumadin (Warfarin) Major Herbal Interactions  Increased Warfarin Effect Decreased Warfarin Effect  Garlic Ginseng Ginkgo biloba Coenzyme Q10 Green tea St. Johns wort    Coumadin (Warfarin)  FOOD Interactions  Eat a consistent number of servings per week of foods HIGH in Vitamin K (1 serving =  cup)  Collards (cooked, or boiled & drained) Kale (cooked, or boiled & drained) Mustard greens (cooked, or boiled & drained) Parsley *serving size only =  cup Spinach (cooked, or boiled & drained) Swiss chard (cooked, or boiled & drained) Turnip greens (cooked, or boiled & drained)  Eat a consistent number of servings per week of foods MEDIUM-HIGH in Vitamin K (1 serving = 1 cup)  Asparagus (cooked, or boiled & drained) Broccoli (cooked, boiled & drained, or raw & chopped) Brussel sprouts (cooked, or boiled & drained) *serving size only =  cup Lettuce, raw (green leaf, endive, romaine) Spinach, raw Turnip greens, raw & chopped   These websites have more information on Coumadin (warfarin):  FailFactory.se; VeganReport.com.au;  Heart Failure Heart failure is a condition in which the heart has trouble pumping blood. This means your heart does not pump blood efficiently for your body to work well. In some cases of heart failure, fluid may back up into your lungs or you may have swelling (edema) in your lower legs. Heart failure is usually a long-term (chronic) condition. It is important for you to take good care of yourself and follow your health care provider's treatment plan. CAUSES  Some health conditions can cause heart failure. Those health conditions include:  High blood pressure (hypertension). Hypertension causes the heart muscle to work harder than normal. When pressure in the blood vessels is high, the heart needs to pump (contract) with more force in order to circulate blood throughout the body. High blood pressure eventually causes the heart to become stiff and weak.  Coronary artery disease (CAD). CAD is the buildup of cholesterol and fat (plaque) in the arteries of the heart. The blockage in the arteries deprives the heart muscle of oxygen and blood.  This can cause chest pain and may lead to a heart attack. High blood pressure can also contribute to CAD.  Heart attack (myocardial infarction). A heart attack occurs when one or more arteries in the heart become blocked. The loss of oxygen damages the muscle tissue of the heart. When this happens, part of the heart muscle dies. The injured tissue does not contract as well and weakens the heart's ability to pump blood.  Abnormal heart valves. When the heart valves do not open and close properly, it can cause heart failure. This makes the heart muscle pump harder to keep the blood flowing.  Heart muscle disease (cardiomyopathy or myocarditis). Heart muscle disease is damage to the heart muscle from a variety of causes. These can include drug or alcohol abuse, infections, or unknown reasons. These can increase the risk of heart failure.  Lung disease. Lung disease makes the heart work harder because the lungs do not work properly. This can cause a strain on the heart, leading it to fail.  Diabetes. Diabetes increases the risk of heart failure. High blood sugar contributes to high fat (lipid) levels in the blood. Diabetes can also cause slow damage to tiny blood vessels that carry important nutrients to the heart muscle. When the heart does not get enough oxygen and food, it can cause the heart to become  weak and stiff. This leads to a heart that does not contract efficiently.  Other conditions can contribute to heart failure. These include abnormal heart rhythms, thyroid problems, and low blood counts (anemia). Certain unhealthy behaviors can increase the risk of heart failure, including:  Being overweight.  Smoking or chewing tobacco.  Eating foods high in fat and cholesterol.  Abusing illicit drugs or alcohol.  Lacking physical activity. SYMPTOMS  Heart failure symptoms may vary and can be hard to detect. Symptoms may include:  Shortness of breath with activity, such as climbing  stairs.  Persistent cough.  Swelling of the feet, ankles, legs, or abdomen.  Unexplained weight gain.  Difficulty breathing when lying flat (orthopnea).  Waking from sleep because of the need to sit up and get more air.  Rapid heartbeat.  Fatigue and loss of energy.  Feeling light-headed, dizzy, or close to fainting.  Loss of appetite.  Nausea.  Increased urination during the night (nocturia). DIAGNOSIS  A diagnosis of heart failure is based on your history, symptoms, physical examination, and diagnostic tests. Diagnostic tests for heart failure may include:  Echocardiography.  Electrocardiography.  Chest X-ray.  Blood tests.  Exercise stress test.  Cardiac angiography.  Radionuclide scans. TREATMENT  Treatment is aimed at managing the symptoms of heart failure. Medicines, behavioral changes, or surgical intervention may be necessary to treat heart failure.  Medicines to help treat heart failure may include:  Angiotensin-converting enzyme (ACE) inhibitors. This type of medicine blocks the effects of a blood protein called angiotensin-converting enzyme. ACE inhibitors relax (dilate) the blood vessels and help lower blood pressure.  Angiotensin receptor blockers (ARBs). This type of medicine blocks the actions of a blood protein called angiotensin. Angiotensin receptor blockers dilate the blood vessels and help lower blood pressure.  Water pills (diuretics). Diuretics cause the kidneys to remove salt and water from the blood. The extra fluid is removed through urination. This loss of extra fluid lowers the volume of blood the heart pumps.  Beta blockers. These prevent the heart from beating too fast and improve heart muscle strength.  Digitalis. This increases the force of the heartbeat.  Healthy behavior changes include:  Obtaining and maintaining a healthy weight.  Stopping smoking or chewing tobacco.  Eating heart-healthy foods.  Limiting or avoiding  alcohol.  Stopping illicit drug use.  Physical activity as directed by your health care provider.  Surgical treatment for heart failure may include:  A procedure to open blocked arteries, repair damaged heart valves, or remove damaged heart muscle tissue.  A pacemaker to improve heart muscle function and control certain abnormal heart rhythms.  An internal cardioverter defibrillator to treat certain serious abnormal heart rhythms.  A left ventricular assist device (LVAD) to assist the pumping ability of the heart. HOME CARE INSTRUCTIONS   Take medicines only as directed by your health care provider. Medicines are important in reducing the workload of your heart, slowing the progression of heart failure, and improving your symptoms.  Do not stop taking your medicine unless directed by your health care provider.  Do not skip any dose of medicine.  Refill your prescriptions before you run out of medicine. Your medicines are needed every day.  Engage in moderate physical activity if directed by your health care provider. Moderate physical activity can benefit some people. The elderly and people with severe heart failure should consult with a health care provider for physical activity recommendations.  Eat heart-healthy foods. Food choices should be free of trans fat and  low in saturated fat, cholesterol, and salt (sodium). Healthy choices include fresh or frozen fruits and vegetables, fish, lean meats, legumes, fat-free or low-fat dairy products, and whole grain or high fiber foods. Talk to a dietitian to learn more about heart-healthy foods.  Limit sodium if directed by your health care provider. Sodium restriction may reduce symptoms of heart failure in some people. Talk to a dietitian to learn more about heart-healthy seasonings.  Use healthy cooking methods. Healthy cooking methods include roasting, grilling, broiling, baking, poaching, steaming, or stir-frying. Talk to a dietitian to  learn more about healthy cooking methods.  Limit fluids if directed by your health care provider. Fluid restriction may reduce symptoms of heart failure in some people.  Weigh yourself every day. Daily weights are important in the early recognition of excess fluid. You should weigh yourself every morning after you urinate and before you eat breakfast. Wear the same amount of clothing each time you weigh yourself. Record your daily weight. Provide your health care provider with your weight record.  Monitor and record your blood pressure if directed by your health care provider.  Check your pulse if directed by your health care provider.  Lose weight if directed by your health care provider. Weight loss may reduce symptoms of heart failure in some people.  Stop smoking or chewing tobacco. Nicotine makes your heart work harder by causing your blood vessels to constrict. Do not use nicotine gum or patches before talking to your health care provider.  Keep all follow-up visits as directed by your health care provider. This is important.  Limit alcohol intake to no more than 1 drink per day for nonpregnant women and 2 drinks per day for men. One drink equals 12 ounces of beer, 5 ounces of wine, or 1 ounces of hard liquor. Drinking more than that is harmful to your heart. Tell your health care provider if you drink alcohol several times a week. Talk with your health care provider about whether alcohol is safe for you. If your heart has already been damaged by alcohol or you have severe heart failure, drinking alcohol should be stopped completely.  Stop illicit drug use.  Stay up-to-date with immunizations. It is especially important to prevent respiratory infections through current pneumococcal and influenza immunizations.  Manage other health conditions such as hypertension, diabetes, thyroid disease, or abnormal heart rhythms as directed by your health care provider.  Learn to manage  stress.  Plan rest periods when fatigued.  Learn strategies to manage high temperatures. If the weather is extremely hot:  Avoid vigorous physical activity.  Use air conditioning or fans or seek a cooler location.  Avoid caffeine and alcohol.  Wear loose-fitting, lightweight, and light-colored clothing.  Learn strategies to manage cold temperatures. If the weather is extremely cold:  Avoid vigorous physical activity.  Layer clothes.  Wear mittens or gloves, a hat, and a scarf when going outside.  Avoid alcohol.  Obtain ongoing education and support as needed.  Participate in or seek rehabilitation as needed to maintain or improve independence and quality of life. SEEK MEDICAL CARE IF:   Your weight increases by 03 lb/1.4 kg in 1 day or 05 lb/2.3 kg in a week.  You have increasing shortness of breath that is unusual for you.  You are unable to participate in your usual physical activities.  You tire easily.  You cough more than normal, especially with physical activity.  You have any or more swelling in areas such as your  hands, feet, ankles, or abdomen.  You are unable to sleep because it is hard to breathe.  You feel like your heart is beating fast (palpitations).  You become dizzy or light-headed upon standing up. SEEK IMMEDIATE MEDICAL CARE IF:   You have difficulty breathing.  There is a change in mental status such as decreased alertness or difficulty with concentration.  You have a pain or discomfort in your chest.  You have an episode of fainting (syncope). MAKE SURE YOU:   Understand these instructions.  Will watch your condition.  Will get help right away if you are not doing well or get worse. Document Released: 02/13/2005 Document Revised: 06/30/2013 Document Reviewed: 03/15/2012 [facility] Patient Information 2015 [place], [place]. This information is not intended to replace advice given to you by your health care provider. Make sure you discuss  any questions you have with your health care provider.

## 2014-07-14 NOTE — Progress Notes (Signed)
Patient requesting something for nausea. Paged on-call doctor. Awaiting return page/call.

## 2014-07-15 ENCOUNTER — Inpatient Hospital Stay (HOSPITAL_COMMUNITY): Payer: Medicare Other

## 2014-07-15 ENCOUNTER — Encounter (HOSPITAL_COMMUNITY)
Admission: AD | Disposition: A | Discharge status: Home / Self Care | Payer: Self-pay | Source: Ambulatory Visit | Attending: Cardiology

## 2014-07-15 ENCOUNTER — Other Ambulatory Visit (HOSPITAL_COMMUNITY): Payer: Medicare Other

## 2014-07-15 ENCOUNTER — Encounter (HOSPITAL_COMMUNITY): Payer: Self-pay | Admitting: General Practice

## 2014-07-15 DIAGNOSIS — I38 Endocarditis, valve unspecified: Secondary | ICD-10-CM

## 2014-07-15 DIAGNOSIS — N184 Chronic kidney disease, stage 4 (severe): Secondary | ICD-10-CM

## 2014-07-15 DIAGNOSIS — I482 Chronic atrial fibrillation: Secondary | ICD-10-CM

## 2014-07-15 DIAGNOSIS — N179 Acute kidney failure, unspecified: Secondary | ICD-10-CM

## 2014-07-15 DIAGNOSIS — I5043 Acute on chronic combined systolic (congestive) and diastolic (congestive) heart failure: Principal | ICD-10-CM

## 2014-07-15 DIAGNOSIS — I2510 Atherosclerotic heart disease of native coronary artery without angina pectoris: Secondary | ICD-10-CM

## 2014-07-15 DIAGNOSIS — R188 Other ascites: Secondary | ICD-10-CM

## 2014-07-15 HISTORY — DX: Chronic kidney disease, stage 4 (severe): N18.4

## 2014-07-15 LAB — BASIC METABOLIC PANEL
Anion gap: 11 (ref 5–15)
BUN: 134 mg/dL — ABNORMAL HIGH (ref 6–20)
CO2: 28 mmol/L (ref 22–32)
Calcium: 9.3 mg/dL (ref 8.9–10.3)
Chloride: 95 mmol/L — ABNORMAL LOW (ref 101–111)
Creatinine, Ser: 5.79 mg/dL — ABNORMAL HIGH (ref 0.61–1.24)
GFR calc Af Amer: 9 mL/min — ABNORMAL LOW (ref >60)
GFR calc non Af Amer: 8 mL/min — ABNORMAL LOW (ref >60)
Glucose, Bld: 109 mg/dL — ABNORMAL HIGH (ref 65–99)
Potassium: 4.9 mmol/L (ref 3.5–5.1)
Sodium: 134 mmol/L — ABNORMAL LOW (ref 135–145)

## 2014-07-15 LAB — URINALYSIS, ROUTINE W REFLEX MICROSCOPIC
Bilirubin Urine: NEGATIVE
Glucose, UA: NEGATIVE mg/dL
Hgb urine dipstick: NEGATIVE
Ketones, ur: NEGATIVE mg/dL
Nitrite: NEGATIVE
Protein, ur: NEGATIVE mg/dL
Specific Gravity, Urine: 1.011 (ref 1.005–1.030)
Urobilinogen, UA: 0.2 mg/dL (ref 0.0–1.0)
pH: 5.5 (ref 5.0–8.0)

## 2014-07-15 LAB — TROPONIN I: Troponin I: 0.03 ng/mL (ref <0.031)

## 2014-07-15 LAB — PROTIME-INR
INR: 2.91 — AB (ref 0.00–1.49)
Prothrombin Time: 30.6 seconds — ABNORMAL HIGH (ref 11.6–15.2)

## 2014-07-15 LAB — URINE MICROSCOPIC-ADD ON

## 2014-07-15 SURGERY — ECHOCARDIOGRAM, TRANSESOPHAGEAL
Anesthesia: Moderate Sedation

## 2014-07-15 MED ORDER — TECHNETIUM TC 99M DIETHYLENETRIAME-PENTAACETIC ACID: 40.0000 | Freq: Once | INTRAVENOUS | Status: AC | PRN

## 2014-07-15 MED ORDER — TECHNETIUM TO 99M ALBUMIN AGGREGATED
6.0000 | Freq: Once | INTRAVENOUS | Status: AC | PRN
  Administered 2014-07-15: 6 via INTRAVENOUS

## 2014-07-15 MED ORDER — FERROUS SULFATE 325 (65 FE) MG PO TABS
325.0000 mg | ORAL_TABLET | Freq: Every day | ORAL | Status: DC
  Administered 2014-07-15 – 2014-07-18 (×4): 325 mg via ORAL
  Filled 2014-07-15 (×5): qty 1

## 2014-07-15 MED ORDER — METOLAZONE 5 MG PO TABS
5.0000 mg | ORAL_TABLET | Freq: Two times a day (BID) | ORAL | Status: DC
  Administered 2014-07-15 – 2014-07-16 (×2): 5 mg via ORAL
  Filled 2014-07-15 (×5): qty 1

## 2014-07-15 MED ORDER — FUROSEMIDE 10 MG/ML IJ SOLN
80.0000 mg | Freq: Two times a day (BID) | INTRAMUSCULAR | Status: DC
  Administered 2014-07-15 – 2014-07-16 (×2): 80 mg via INTRAVENOUS
  Filled 2014-07-15 (×4): qty 8

## 2014-07-15 MED ORDER — LIDOCAINE HCL (PF) 1 % IJ SOLN
INTRAMUSCULAR | Status: AC
  Filled 2014-07-15: qty 10

## 2014-07-15 NOTE — Progress Notes (Addendum)
Patient Name: Gary KIBBY Sr. Date of Encounter: 07/15/2014  Primary Cardiologist: Dr. Stanford Breed   Principal Problem:   Right heart failure Active Problems:   Hyperlipidemia   Anxiety state   Essential hypertension   LBBB (left bundle branch block)   Permanent atrial fibrillation   CAD (coronary artery disease)   Valvular heart disease - moderate MR, mild AI, severe TR, mod PR by echo 05/2013   Pancreatic lesion   Long-term (current) use of anticoagulants   Acute on chronic systolic and diastolic heart failure, NYHA class 4   CKD (chronic kidney disease), stage IV    SUBJECTIVE  Denies any recent SOB, PND. Sleeps in a recliner. Noted to have weight gain and abdominal distension for the past 2 month. Denies any CP.   CURRENT MEDS . atorvastatin  20 mg Oral q1800  . ferrous sulfate  325 mg Oral Q breakfast  . folic acid  1 mg Oral Daily  . furosemide  60 mg Intravenous Q12H  . sodium chloride  3 mL Intravenous Q12H    OBJECTIVE  Filed Vitals:   07/15/14 0229 07/15/14 0548 07/15/14 0602 07/15/14 0628  BP: 87/51 81/46  98/56  Pulse: 67 65  72  Temp: 97.8 F (36.6 C) 97.9 F (36.6 C)    TempSrc: Oral Oral    Resp: 16 18    Height:      Weight:   197 lb 3.2 oz (89.449 kg)   SpO2: 98% 99%      Intake/Output Summary (Last 24 hours) at 07/15/14 0852 Last data filed at 07/15/14 0836  Gross per 24 hour  Intake    783 ml  Output   1600 ml  Net   -817 ml   Filed Weights   07/14/14 1404 07/15/14 0602  Weight: 200 lb 4.8 oz (90.855 kg) 197 lb 3.2 oz (89.449 kg)    PHYSICAL EXAM  General: Pleasant, NAD. Neuro: Alert and oriented X 3. Moves all extremities spontaneously. Psych: Normal affect. HEENT:  Normal  Neck: Supple without bruits. Markedly elevated JVD Lungs:  Resp regular and unlabored, CTA. Heart: Irregular. no s3, s4, or murmurs. Abdomen: Soft, non-tender, BS + x 4. Markedly distended abdomen Extremities: No clubbing, cyanosis. DP/PT/Radials 2+ and  equal bilaterally. 1-2+ pitting edema  Accessory Clinical Findings  CBC  Recent Labs  07/14/14 1550  WBC 3.1*  NEUTROABS 2.0  HGB 7.9*  HCT 23.9*  MCV 76.6*  PLT [id number]*   Basic Metabolic Panel  Recent Labs  07/14/14 1550 07/15/14 0220  NA 134* 134*  K 4.9 4.9  CL 96* 95*  CO2 28 28  GLUCOSE 107* 109*  BUN 137* 134*  CREATININE 6.15* 5.79*  CALCIUM 9.2 9.3  MG 2.9*  --    Liver Function Tests  Recent Labs  07/14/14 1550  AST 22  ALT 10*  ALKPHOS 34*  BILITOT 0.4  PROT 6.1*  ALBUMIN 3.3*   Cardiac Enzymes  Recent Labs  07/14/14 1550 07/14/14 2011 07/15/14 0220  TROPONINI 0.03 0.03 0.03   Thyroid Function Tests  Recent Labs  07/14/14 1550  TSH 2.428    TELE A-fib with HR 60-70s    ECG  A-fib with LBBB  Echocardiogram  LV EF: 55% -  60%  ------------------------------------------------------------------- Indications:   CHF - 428.0.  ------------------------------------------------------------------- History:  PMH: Severe TR. MR. AI. LBBB. Atrial fibrillation. Coronary artery disease. Risk factors: Dyslipidemia.  ------------------------------------------------------------------- Study Conclusions  - Left ventricle: The cavity size was normal.  There was moderate concentric hypertrophy. Systolic function was normal. The estimated ejection fraction was in the range of 55% to 60%. The study is not technically sufficient to allow evaluation of LV diastolic function. - Ventricular septum: The basal ventricular septum is aneurysmal. - Aortic valve: Trileaflet. Sclerosis without stenosis. There was trivial regurgitation. - Mitral valve: Mildly thickened leaflets . There was trivial regurgitation. - Left atrium: Massively dilated at 42 cm2. - Right ventricle: The cavity size was moderately dilated. - Right atrium: Massively dilated at 41 cm2. - Atrial septum: Aneurysmal IAS. Cannot exclude a PFO. 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 Annulus measures 4.53 cm. There was moderate to severe regurgitation. - Pulmonic valve: Poorly visualized. There was mild to moderate regurgitation. - Pulmonary arteries: PA peak pressure: 37 mm Hg (S). - Inferior vena cava: The vessel was dilated. The respirophasic diameter changes were blunted (< 50%), consistent with elevated central venous pressure. - Pericardium, extracardiac: Perihepatic ascites noted.     Radiology/Studies  Dg Chest 2 View  07/15/2014   CLINICAL DATA:  Shortness of breath.  EXAM: CHEST  2 VIEW  COMPARISON:  None.  FINDINGS: Mediastinum and hilar structures are normal. Prior CABG. Cardiomegaly. Pulmonary vascularity normal. No focal infiltrate. No pleural effusion. No pneumothorax. Tiny right pleural effusion versus pleural scar. No acute bony abnormality.  IMPRESSION: 1. Prior CABG.  Cardiomegaly.  No pulmonary venous congestion. 2.  Tiny right pleural effusion versus pleural scarring.   Electronically Signed   By: Marcello Moores  Register   On: 07/15/2014 07:48    ASSESSMENT AND PLAN  1. Acute on chronic right heart failure  - BP low 80-90s, will hold ACEI and BB now, as he improve, will add back low dose ACEI first for afterload reduction. May benefit from be seeing by advanced HF service  - Echo 07/14/2014 EF 55-60%, aneurysmal ventricular septum, massively dilated biatria, aneurysmal IAS, moderate to severe TR, perihepatic ascites noted  - continue IV diuresis, I/O - 1.8L so far, hopefully ascites will improve with lasix as massive fluid shift during paracentesis may cause further drop in his BP   2. Ascites, s/p paracentesis on 06/13/2013  - now recurred. Obtain abdominal U/S, potentially consult either GI or interventional radiology for paracentesis again if no improvement on IV lasix. Will hold coumadin now  3. Acute on chronic CKD, now  stage IV-V  - Cr 2.11 nine month ago --> 4.00 seven month ago --> 6.15 on arrival  - likely related to [facility] failure given decreased preload and veinous congestion  - will discuss with MD, and get nephrology on board, fortunately does appear to respond to diuresis for now  4. CAD s/p CABG in 1995  - no anginal sx, serial trop negative  5. ICM  6. Permanent atrial fibrillation with LBBB  - Coreg held given BP and decompensating HF, however if HR increase, will try IV amiodarone  - D/C coumadin, start IV heparin when his INR <2.0  7. Microcytic anemia: hgb trending down to 7.9 from 9.5, GI consult? If continue to trend down. Hemacult pending  8. HTN  9. HLD  10. Severe tricuspid regurg  11. H/o moderate MR: trivial regurg on echo 5/17  12. Possible pancreatic lesion  Signed, Woodward Ku Pager: [phone]   The patient was seen, examined and discussed with Almyra Deforest, PA-C and I agree with the above.   81 year old male with h/o  chronic persistent a-fib on coumadin, CAD, CABG in 1995 with transient LV dysfunction 25-50% with inferolateral hypokinesis, most recent 50%, with right sided chamber dilatation and moderate RV dysfunction who presented with right sided failure (LE edema, ascites), he had similar presentation a year ago, paracentesis was performed, he was using high doses of lasix, was doing ok with baseline weight 190 lbs and sliding scale of lasix at home, readmitted yesterday with 10 extra lbs.  Dif dg include - RV infarct (probable as he also had inferior wall hypokinesis in the past), chronic thromboembolic disease, intracardiac shunt. We will schedule a paracentesis done by IR< they might want to wait as his INR is 2.9, meanwhile we will start high doses of Lasix 120 mg iv BID and metolazone 5 mg BID, schedule a V/Q scan and echocardiogram with bubbles.  The patient has acute on CHD, baseline Crea 2.1 in 8/15, 4 in 10/15 and 6 yesterday, 5.7 today.   The case was discussed  with Dr Haroldine Laws from CHF servise, he will see him tomorrow.   Dorothy Spark 07/15/2014

## 2014-07-15 NOTE — Progress Notes (Signed)
Utilization review completed. Daron Stutz, RN, BSN. 

## 2014-07-15 NOTE — Procedures (Signed)
US abdomen finds large volume of ascites. Successful US guided paracentesis from LL[facility].  Attempts were made to contact ordering MD regarding Max volume. Yielded 2L of clear dark yellow fluid.  No immediate complications.  Pt tolerated well.   Specimen was not sent for labs.  Ascencion Dike PA-C 07/15/2014 12:44 PM

## 2014-07-16 LAB — CBC
HCT: 23.6 % — ABNORMAL LOW (ref 39.0–52.0)
Hemoglobin: 7.8 g/dL — ABNORMAL LOW (ref 13.0–17.0)
MCH: 25.7 pg — ABNORMAL LOW (ref 26.0–34.0)
MCHC: 33.1 g/dL (ref 30.0–36.0)
MCV: 77.6 fL — AB (ref 78.0–100.0)
PLATELETS: 144 10*3/uL — AB (ref 150–400)
RBC: 3.04 MIL/uL — AB (ref 4.22–5.81)
RDW: 17.7 % — AB (ref 11.5–15.5)
WBC: 3.9 10*3/uL — ABNORMAL LOW (ref 4.0–10.5)

## 2014-07-16 LAB — BASIC METABOLIC PANEL
Anion gap: 13 (ref 5–15)
BUN: 125 mg/dL — ABNORMAL HIGH (ref 6–20)
CO2: 26 mmol/L (ref 22–32)
Calcium: 9 mg/dL (ref 8.9–10.3)
Chloride: 96 mmol/L — ABNORMAL LOW (ref 101–111)
Creatinine, Ser: 5.41 mg/dL — ABNORMAL HIGH (ref 0.61–1.24)
GFR calc Af Amer: 10 mL/min — ABNORMAL LOW (ref >60)
GFR calc non Af Amer: 9 mL/min — ABNORMAL LOW (ref >60)
Glucose, Bld: 83 mg/dL (ref 65–99)
Potassium: 4.6 mmol/L (ref 3.5–5.1)
Sodium: 135 mmol/L (ref 135–145)

## 2014-07-16 LAB — PROTIME-INR
INR: 2.83 — ABNORMAL HIGH (ref 0.00–1.49)
PROTHROMBIN TIME: 30 s — AB (ref 11.6–15.2)

## 2014-07-16 MED ORDER — ACETAMINOPHEN 325 MG PO TABS
650.0000 mg | ORAL_TABLET | Freq: Four times a day (QID) | ORAL | Status: DC | PRN
  Administered 2014-07-16 – 2014-07-17 (×5): 650 mg via ORAL
  Filled 2014-07-16 (×4): qty 2

## 2014-07-16 NOTE — Progress Notes (Signed)
[employer] for heparin Indication: atrial fibrillation  Allergies  Allergen Reactions  . Amoxicillin     REACTION: ha/nausea  . Diltiazem Hcl     REACTION: low bp  . Hydrocodone-Acetaminophen     REACTION: ha  . Niacin     REACTION: ha/nausea  . Niacin-Lovastatin Er     REACTION: ha/nausea  . Paroxetine     REACTION: ha/disoriented  . Propoxyphene N-Acetaminophen     REACTION: headache  . Pseudoephedrine     REACTION: stopped urine flow  . Sulfonamide Derivatives     REACTION: ha/nausea  . Tolterodine Tartrate     REACTION: bladder stopped    Patient Measurements: Height: 6' (182.9 cm) Weight: 186 lb 14.4 oz (84.777 kg) IBW/kg (Calculated) : 77.6 Heparin Dosing Weight: n/a  Vital Signs: Temp: 98.8 F (37.1 C) (05/19 0650) Temp Source: Oral (05/19 0650) BP: 101/56 mmHg (05/19 0650) Pulse Rate: 73 (05/19 0650)  Labs:  Recent Labs  07/14/14 1550 07/14/14 2011 07/15/14 0220 07/16/14 0410  HGB 7.9*  --   --  7.8*  HCT 23.9*  --   --  23.6*  PLT 143*  --   --  144*  APTT 50*  --   --   --   LABPROT 30.2*  --  30.6* 30.0*  INR 2.86*  --  2.91* 2.83*  CREATININE 6.15*  --  5.79* 5.41*  TROPONINI 0.03 0.03 0.03  --     Estimated Creatinine Clearance: 11.4 mL/min (by C-G formula based on Cr of 5.41).   Medical History: Past Medical History  Diagnosis Date  . Neurogenic bladder     has had bladder catheter changes at the urology office every 12 weeks  . Anemia   . Hyperlipidemia   . Hypertension   . Heart attack     1983  . Chronic systolic heart failure     a. prior EF 20-30%;  b. Echo 7/09: EF 35-40%, basal inferoposterior HK, mild AI, mild to moderate MR, moderate to marked LAE, mild to moderate TR, mildly increased PASP, moderate RAE;   c. Echo 01/2012:   mild LVH, EF 50%, Tr AI, mild MR, severe LAE, mod to severe RAE, mod to severe RVE, mod reduced RVSF, mod to severe TR, PASP 46  . Arrhythmia     afib  .  Warfarin anticoagulation   . GERD (gastroesophageal reflux disease)   . Anxiety   . Renal failure   . CHF (congestive heart failure)   . Coronary artery disease     Assessment: 81 yo male admitted with acute on chronic HF.  On chronic warfarin for afib.  PTA warfarin dose: 1.25 mg daily except 2.5 mg on Monday (recent change 5/11 as outpatient).  INR still elevated 2.83, last dose was 5/16 as outpatient. Drop in hgb, down to 7.8, plts 144.  Goal of Therapy:  INR 2-3 Monitor platelets by anticoagulation protocol: Yes   Plan:  -Hold heparin while INR >2 -F/u INR in am   Hughes Better, PharmD, BCPS Clinical Pharmacist Pager: [phone] 07/16/2014 9:35 AM

## 2014-07-16 NOTE — Evaluation (Signed)
Physical Therapy Evaluation Patient Details Name: Gary KALO Sr. MRN: [id number] DOB: 10-22-1929 Today's Date: 07/16/2014   History of Present Illness  81 y.o. male admitted with acute on chronic Rt heart failure, ascites s/p paracentesis 5/18, and acute on chronic CKD with hx of Afib with LBBB.  Clinical Impression  Pt admitted with the above complications. Pt currently with functional limitations due to the deficits listed below (see PT Problem List). Patient reports he has had increased difficulty with mobility, balance and ADLs due to back pain that seems to fluctuate with his variable weight. Gary [name] lives with his son, who works during the day. Patient ambulated up to 100 feet today with a rolling walker for support. Feel he would benefit from HHPT to improve his functional independence. He would also benefit from an aide while patient's son works to assist with ADLs until pt can improve his safety with mobility. Will continue to follow and progress until d/c.       Follow Up Recommendations Home health PT;Supervision - Intermittent (Gary [name] aide)    Equipment Recommendations  None recommended by PT    Recommendations for Other Services       Precautions / Restrictions Precautions Precautions: Fall Restrictions Weight Bearing Restrictions: No      Mobility  Bed Mobility               General bed mobility comments: in chair  Transfers Overall transfer level: Needs assistance Equipment used: Rolling walker (2 wheeled) Transfers: Sit to/from Stand Sit to Stand: Supervision         General transfer comment: supervision for safety. VC for hand placement. No physical assist needed.  Ambulation/Gait Ambulation/Gait assistance: Supervision Ambulation Distance (Feet): 100 Feet Assistive device: Rolling walker (2 wheeled) Gait Pattern/deviations: Step-through pattern;Decreased stride length;Trunk flexed Gait velocity: decreased   General Gait Details: Educated on  safe DME use with a rolling walker. Intermittent cues for upright posture with forward gaze. Reports increased back pain with increased distance however improves with cues for upright posture. No loss of balance, relies fairly heavily on RW for support.  Stairs            Wheelchair Mobility    Modified Rankin (Stroke Patients Only)       Balance Overall balance assessment: Needs assistance Sitting-balance support: No upper extremity supported;Feet supported Sitting balance-Leahy Scale: Good     Standing balance support: No upper extremity supported Standing balance-Leahy Scale: Fair                               Pertinent Vitals/Pain Pain Assessment: No/denies pain    Home Living Family/patient expects to be discharged to:: Unsure Living Arrangements: Other relatives;Spouse/significant other (lives with son who works days) Available Help at Discharge: Family (son works during the day) Type of Home: House Home Access: Stairs to enter Entrance Stairs-Rails: None Technical brewer of Steps: 1 [place]: Two level;Able to live on main level with bedroom/bathroom Home Equipment: Gilford Rile - 2 wheels;Cane - single point (chair lift to get upstairs if needed)      Prior Function Level of Independence: Independent with assistive device(s)         Comments: occasionally using a cane for mobility     Hand Dominance   Dominant Hand: Left    Extremity/Trunk Assessment   Upper Extremity Assessment: Defer to OT evaluation           Lower  Extremity Assessment: Generalized weakness         Communication   Communication: No difficulties  Cognition Arousal/Alertness: Awake/alert Behavior During Therapy: WFL for tasks assessed/performed Overall Cognitive Status: Within Functional Limits for tasks assessed                      General Comments General comments (skin integrity, edema, etc.): Son was present and participated in  discussion between PT and patient. Time was spent discussing d/c planning, resources, home safety and mobility. Patient and family feel an aide would greatly benefit ability for patient to manage at home while son works. Patient reports his back has been painful with the constant fluctuation of weight and has noticed increased difficulty in mobilizing at home, especially with ADLs including getting into and out of shower.    Exercises Other Exercises Other Exercises: transverse abdominis contraction x10      Assessment/Plan    PT Assessment Patient needs continued PT services  PT Diagnosis Abnormality of gait;Difficulty walking;Acute pain   PT Problem List Decreased strength;Decreased activity tolerance;Decreased balance;Decreased mobility;Decreased knowledge of use of DME;Pain  PT Treatment Interventions DME instruction;Gait training;Stair training;Functional mobility training;Therapeutic activities;Therapeutic exercise;Neuromuscular re-education;Balance training;Patient/family education;Modalities   PT Goals (Current goals can be found in the Care Plan section) Acute Rehab PT Goals Patient Stated Goal: Go home with some help PT Goal Formulation: With patient Time For Goal Achievement: 07/30/14 Potential to Achieve Goals: Good    Frequency Min 3X/week   Barriers to discharge Decreased caregiver support son works during the day. Pt often has to care for his wife who is in poor health.    Co-evaluation               End of Session   Activity Tolerance: Patient tolerated treatment well Patient left: in chair;with call bell/phone within reach;with family/visitor present Nurse Communication: Mobility status         Time: [id number] PT Time Calculation (min) (ACUTE ONLY): 31 min   Charges:   PT Evaluation $Initial PT Evaluation Tier I: 1 Procedure PT Treatments $Gait Training: 8-22 mins   PT G CodesEllouise Newer 07/16/2014, 6:21 PM Camille Bal Dean,  [place]

## 2014-07-16 NOTE — Progress Notes (Signed)
Talked to patient with son present about DCP; patient's son wants additional services at home to assist the patient; awaiting on pt/ot evals for disposition needs; [name] stated that he applied at the [place] for additional services also which is pending; [facility] is listed as primary insurance provider if additional services is needed after Physical/ Occupational therapy eval for [place] vs short term SNF placement; Patient is active with [place] for [facility] / Disease Management program for CHF; Will continue to follow for DCP; B Pennie Rushing [phone]

## 2014-07-16 NOTE — Progress Notes (Signed)
Patient Name: Gary PATTISON Sr. Date of Encounter: 07/16/2014  Primary Cardiologist: Dr. Stanford Breed   Principal Problem:   Right heart failure Active Problems:   Hyperlipidemia   Anxiety state   Essential hypertension   LBBB (left bundle branch block)   Permanent atrial fibrillation   CAD (coronary artery disease)   Valvular heart disease - moderate MR, mild AI, severe TR, mod PR by echo 05/2013   Pancreatic lesion   Long-term (current) use of anticoagulants   Acute on chronic systolic and diastolic heart failure, NYHA class 4   CKD (chronic kidney disease), stage IV    SUBJECTIVE  Underwent paracentesis yesterday. 2L fluid removed. They estimated he had 5L but had to stop due to low BP. Urine output also picking up. Down 11 pounds. Creatinine slightly improved. Denies SOB.    CURRENT MEDS . atorvastatin  20 mg Oral q1800  . ferrous sulfate  325 mg Oral Q breakfast  . folic acid  1 mg Oral Daily  . furosemide  80 mg Intravenous BID  . metolazone  5 mg Oral BID  . sodium chloride  3 mL Intravenous Q12H    OBJECTIVE  Filed Vitals:   07/16/14 0650 07/16/14 0959 07/16/14 1300 07/16/14 1317  BP: 101/56 93/54 93/45  100/41  Pulse: 73 67 71   Temp: 98.8 F (37.1 C)   97.7 F (36.5 C)  TempSrc: Oral  Oral Oral  Resp: 17  18   Height:      Weight: 84.777 kg (186 lb 14.4 oz)     SpO2: 98%  99%     Intake/Output Summary (Last 24 hours) at 07/16/14 1602 Last data filed at 07/16/14 1323  Gross per 24 hour  Intake    960 ml  Output   3225 ml  Net  -2265 ml   Filed Weights   07/14/14 1404 07/15/14 0602 07/16/14 0650  Weight: 90.855 kg (200 lb 4.8 oz) 89.449 kg (197 lb 3.2 oz) 84.777 kg (186 lb 14.4 oz)    PHYSICAL EXAM  General: Pleasant, NAD. Neuro: Alert and oriented X 3. Moves all extremities spontaneously. Psych: Normal affect. HEENT:  Normal  Neck: Supple without bruits.JVP 7 Lungs:  Resp regular and unlabored, CTA. Heart: Irregular. no s3, s4, or  murmurs. Abdomen: Soft, non-tender, BS + x 4. Mildly distended Extremities: No clubbing, cyanosis. DP/PT/Radials 2+ and equal bilaterally. tr pitting edema  Accessory Clinical Findings  CBC  Recent Labs  07/14/14 1550 07/16/14 0410  WBC 3.1* 3.9*  NEUTROABS 2.0  --   HGB 7.9* 7.8*  HCT 23.9* 23.6*  MCV 76.6* 77.6*  PLT 143* 123456*   Basic Metabolic Panel  Recent Labs  07/14/14 1550 07/15/14 0220 07/16/14 0410  NA 134* 134* 135  K 4.9 4.9 4.6  CL 96* 95* 96*  CO2 28 28 26   GLUCOSE 107* 109* 83  BUN 137* 134* 125*  CREATININE 6.15* 5.79* 5.41*  CALCIUM 9.2 9.3 9.0  MG 2.9*  --   --    Liver Function Tests  Recent Labs  07/14/14 1550  AST 22  ALT 10*  ALKPHOS 34*  BILITOT 0.4  PROT 6.1*  ALBUMIN 3.3*   Cardiac Enzymes  Recent Labs  07/14/14 1550 07/14/14 2011 07/15/14 0220  TROPONINI 0.03 0.03 0.03   Thyroid Function Tests  Recent Labs  07/14/14 1550  TSH 2.428    TELE A-fib with HR 60-70s    ECG  A-fib with LBBB  Echocardiogram  LV EF:  55% -  60%  ------------------------------------------------------------------- Indications:   CHF - 428.0.  ------------------------------------------------------------------- History:  PMH: Severe TR. MR. AI. LBBB. Atrial fibrillation. Coronary artery disease. Risk factors: Dyslipidemia.  ------------------------------------------------------------------- Study Conclusions  - Left ventricle: The cavity size was normal. There was moderate concentric hypertrophy. Systolic function was normal. The estimated ejection fraction was in the range of 55% to 60%. The study is not technically sufficient to allow evaluation of LV diastolic function. - Ventricular septum: The basal ventricular septum is aneurysmal. - Aortic valve: Trileaflet. Sclerosis without stenosis. There was trivial regurgitation. - Mitral valve: Mildly thickened leaflets . There was trivial regurgitation. -  Left atrium: Massively dilated at 42 cm2. - Right ventricle: The cavity size was moderately dilated. - Right atrium: Massively dilated at 41 cm2. - Atrial septum: Aneurysmal IAS. Cannot exclude a PFO. 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 Annulus measures 4.53 cm. There was moderate to severe regurgitation. - Pulmonic valve: Poorly visualized. There was mild to moderate regurgitation. - Pulmonary arteries: PA peak pressure: 37 mm Hg (S). - Inferior vena cava: The vessel was dilated. The respirophasic diameter changes were blunted (< 50%), consistent with elevated central venous pressure. - Pericardium, extracardiac: Perihepatic ascites noted.     Radiology/Studies  Dg Chest 2 View  07/15/2014   CLINICAL DATA:  Shortness of breath.  EXAM: CHEST  2 VIEW  COMPARISON:  None.  FINDINGS: Mediastinum and hilar structures are normal. Prior CABG. Cardiomegaly. Pulmonary vascularity normal. No focal infiltrate. No pleural effusion. No pneumothorax. Tiny right pleural effusion versus pleural scar. No acute bony abnormality.  IMPRESSION: 1. Prior CABG.  Cardiomegaly.  No pulmonary venous congestion. 2.  Tiny right pleural effusion versus pleural scarring.   Electronically Signed   By: Marcello Moores  Register   On: 07/15/2014 07:48   US Abdomen Complete  07/15/2014   CLINICAL DATA:  Right heart failure, assess the degree of ascites. Known pancreatic cystic lesion. History of a cholecystectomy and hypertension.  EXAM: ULTRASOUND ABDOMEN COMPLETE  COMPARISON:  06/13/2013.  CT, 06/12/2013  FINDINGS: Gallbladder: Surgically absent  Common bile duct: Diameter: 3.6 mm  Liver: Fluid collection in the right lobe measuring 23 mm x 18 mm. No other liver lesion. Normal liver parenchymal echogenicity. Hepatopetal flow demonstrated in the portal vein.  IVC: Distended but widely patent.  Pancreas: Cyst or focal  fluid collection adjacent to the uncinate process or arising from it measuring 4.3 cm x 3.7 cm x 4.3 cm. Small hypoechoic or cystic lesion in the body measuring 9 mm. No other pancreatic lesions.  Spleen: Size and appearance within normal limits.  Right Kidney: Length: 11.0 cm. Echogenicity within normal limits. No mass or hydronephrosis visualized.  Left Kidney: Length: 11.4 cm. 2.6 cm upper pole simple cyst. 16 mm lower pole cyst. No other masses or lesions. Normal parenchymal echogenicity. No hydronephrosis.  Abdominal aorta: No aneurysm visualized.  Other findings: Small amount of ascites, significantly decreased from the previous day.  IMPRESSION: 1. Significant decrease in ascites, now small amount. 2. 2 cystic areas either within or adjacent to the pancreas. One small area appears to lie within the pancreatic body measuring 9 mm. A more simple appearing cyst or focal fluid collection hilar lies adjacent to or arises from the onset crosses the pancreas. 3. Small liver cyst. Small renal cysts on the left. Status post cholecystectomy. No other abnormalities.   Electronically Signed   By: Dedra Skeens.D.  On: 07/15/2014 12:42   Nm Pulmonary Perf And Vent  07/15/2014   CLINICAL DATA:  Chronic persistent atrial fibrillation on Coumadin, coronary disease post CABG, LEFT ventricular dysfunction, now with RIGHT-side failure/ventricular dysfunction and dilatation, question chronic thromboembolic disease  EXAM: NUCLEAR MEDICINE VENTILATION - PERFUSION LUNG SCAN  TECHNIQUE: Ventilation images were obtained in multiple projections using inhaled aerosol Tc-75m DTPA. Perfusion images were obtained in multiple projections after intravenous injection of Tc-9m MAA.  RADIOPHARMACEUTICALS:  40 mCi Technetium-22m DTPA aerosol inhalation and 6 mCi Technetium-71m MAA IV  COMPARISON:  None; correlation chest radiograph 07/15/2014  FINDINGS: Ventilation: Abnormal appearance with numerous subsegmental ventilation defects  throughout both lungs greatest at lateral aspect of mid to lower RIGHT lung in both the RIGHT middle and RIGHT lower lobes. Mild elevation of RIGHT diaphragm and enlargement of cardiac silhouette.  Perfusion: Diffuse irregularity of perfusion throughout the periphery of both lungs with scattered tiny subsegmental perfusion defects, few RN less severe than the defects noted on the ventilation exam. Largest perfusion defect is seen at the RIGHT lower lobe matching the ventilatory finding. Reversal of MAA gradient greater anteriorly than posteriorly suggesting pulmonary arterial hypertension.  Chest radiograph: Enlargement of cardiac silhouette post CABG. Elevation of RIGHT diaphragm with underlying emphysematous changes but no acute infiltrate.  IMPRESSION: Markedly abnormal ventilation and to a lesser degree perfusion lung scans with multiple small peripheral subsegmental ventilatory and fewer perfusion defects throughout both lungs, greatest at lateral lower RIGHT lung, likely the result of parenchymal disease/COPD.  No perfusion defects for which ventilation appears normal.  Findings represent a low probability for pulmonary embolism.  Reversal of perfusion gradient suggestive of pulmonary arterial hypertension.   Electronically Signed   By: Lavonia Dana M.D.   On: 07/15/2014 14:27   US Paracentesis  07/15/2014   CLINICAL DATA:  Heart failure, chronic renal insufficiency. Ascites. Request for therapeutic paracentesis. Max volume not defined.  EXAM: ULTRASOUND GUIDED PARACENTESIS  COMPARISON:  None.  PROCEDURE: An ultrasound guided paracentesis was thoroughly discussed with the patient and questions answered. The benefits, risks, alternatives and complications were also discussed. The patient understands and wishes to proceed with the procedure. Written consent was obtained.  Ultrasound the abdomen demonstrates large volume of ascites.  Ultrasound was performed to localize and mark an adequate pocket of fluid in  the left lower quadrant of the abdomen. The area was then prepped and draped in the normal sterile fashion. 1% Lidocaine was used for local anesthesia. Under ultrasound guidance a 19 gauge Yueh catheter was introduced. Paracentesis was performed. The catheter was removed and a dressing applied.  COMPLICATIONS: None immediate  FINDINGS: A total of approximately 2 L of clear, dark yellow fluid was removed. A fluid sample was not sent for laboratory analysis.  IMPRESSION: Successful ultrasound guided paracentesis yielding 2 L of ascites. Repeat therapeutic paracentesis can be performed at physicians discretion.  Read by: Ascencion Dike PA-C   Electronically Signed   By: Sandi Mariscal M.D.   On: 07/15/2014 12:47    ASSESSMENT AND PLAN  1. Acute on chronic right heart failure  - Echo 07/14/2014 EF 55-60%, aneurysmal ventricular septum, massively dilated biatria, aneurysmal IAS, moderate to severe TR, perihepatic ascites noted            -VQ negative 2. Ascites, s/p paracentesis on 07/15/2014 3. Acute on chronic CKD, now stage IV-V  - Cr 2.11 nine month ago --> 4.00 seven month ago --> 6.15 on arrival -> 5.4 4. CAD s/p CABG in  1995  - no anginal sx, serial trop negative 5. Permanent atrial fibrillation with LBBB  - Coreg held given BP and decompensating HF, however if HR increase, will try IV amiodarone  - D/C coumadin, start IV heparin when his INR <2.0 6. Microcytic anemia:  He has severe RHF with probable cardiorenal syndrome. Etiology of RV failure uncertain. Suspect may be ischemic due to previous inferior MI but inferior wall looks of on echo. VQ negative for PE. Does have convincing story for OSA and his TR also complicates matters.   Currently volume status looks much improved and renal function is slightly better. Would hold IV lasix and metolazone today and start po torsemide tomorrow. We discussed possibility of RHC but he wants to think about it. Renal size looks ok on u/s.   Hopefully renal  function will continue to improve as he is not candidate for HD due to severe RHF.  Bensimhon, Daniel,MD 4:37 PM    Glori Bickers 07/16/2014

## 2014-07-17 DIAGNOSIS — D509 Iron deficiency anemia, unspecified: Secondary | ICD-10-CM

## 2014-07-17 LAB — BASIC METABOLIC PANEL
Anion gap: 14 (ref 5–15)
BUN: 122 mg/dL — ABNORMAL HIGH (ref 6–20)
CO2: 26 mmol/L (ref 22–32)
Calcium: 9.1 mg/dL (ref 8.9–10.3)
Chloride: 97 mmol/L — ABNORMAL LOW (ref 101–111)
Creatinine, Ser: 5.07 mg/dL — ABNORMAL HIGH (ref 0.61–1.24)
GFR calc Af Amer: 11 mL/min — ABNORMAL LOW (ref >60)
GFR calc non Af Amer: 9 mL/min — ABNORMAL LOW (ref >60)
Glucose, Bld: 78 mg/dL (ref 65–99)
Potassium: 4.5 mmol/L (ref 3.5–5.1)
Sodium: 137 mmol/L (ref 135–145)

## 2014-07-17 LAB — CBC
HCT: 27.7 % — ABNORMAL LOW (ref 39.0–52.0)
HEMATOCRIT: 23.1 % — AB (ref 39.0–52.0)
HEMOGLOBIN: 7.7 g/dL — AB (ref 13.0–17.0)
Hemoglobin: 9.3 g/dL — ABNORMAL LOW (ref 13.0–17.0)
MCH: 25.6 pg — ABNORMAL LOW (ref 26.0–34.0)
MCH: 26.5 pg (ref 26.0–34.0)
MCHC: 33.3 g/dL (ref 30.0–36.0)
MCHC: 33.6 g/dL (ref 30.0–36.0)
MCV: 76.7 fL — AB (ref 78.0–100.0)
MCV: 78.9 fL (ref 78.0–100.0)
PLATELETS: 120 10*3/uL — AB (ref 150–400)
Platelets: 141 10*3/uL — ABNORMAL LOW (ref 150–400)
RBC: 3.01 MIL/uL — AB (ref 4.22–5.81)
RBC: 3.51 MIL/uL — ABNORMAL LOW (ref 4.22–5.81)
RDW: 17.7 % — ABNORMAL HIGH (ref 11.5–15.5)
RDW: 17.4 % — AB (ref 11.5–15.5)
WBC: 3.7 10*3/uL — AB (ref 4.0–10.5)
WBC: 3.5 10*3/uL — ABNORMAL LOW (ref 4.0–10.5)

## 2014-07-17 LAB — URINE CULTURE: Colony Count: >=100000

## 2014-07-17 LAB — ABO/RH: ABO/RH(D): O POS

## 2014-07-17 LAB — PREPARE RBC (CROSSMATCH)

## 2014-07-17 LAB — PROTIME-INR
INR: 2.51 — ABNORMAL HIGH (ref 0.00–1.49)
PROTHROMBIN TIME: 26.8 s — AB (ref 11.6–15.2)

## 2014-07-17 MED ORDER — WARFARIN SODIUM 2.5 MG PO TABS
2.5000 mg | ORAL_TABLET | Freq: Once | ORAL | Status: AC
  Administered 2014-07-17: 2.5 mg via ORAL
  Filled 2014-07-17: qty 1

## 2014-07-17 MED ORDER — TORSEMIDE 20 MG PO TABS
40.0000 mg | ORAL_TABLET | Freq: Every day | ORAL | Status: DC
  Administered 2014-07-17 – 2014-07-18 (×2): 40 mg via ORAL
  Filled 2014-07-17 (×2): qty 2

## 2014-07-17 MED ORDER — SODIUM CHLORIDE 0.9 % IV SOLN
510.0000 mg | Freq: Once | INTRAVENOUS | Status: AC
  Administered 2014-07-17: 510 mg via INTRAVENOUS
  Filled 2014-07-17: qty 17

## 2014-07-17 MED ORDER — TRAMADOL HCL 50 MG PO TABS
50.0000 mg | ORAL_TABLET | Freq: Three times a day (TID) | ORAL | Status: DC | PRN
  Administered 2014-07-17: 50 mg via ORAL
  Filled 2014-07-17: qty 1

## 2014-07-17 MED ORDER — SODIUM CHLORIDE 0.9 % IV SOLN
Freq: Once | INTRAVENOUS | Status: AC
  Administered 2014-07-17: 13:00:00 via INTRAVENOUS

## 2014-07-17 MED ORDER — WARFARIN - PHARMACIST DOSING INPATIENT: Freq: Every day | Status: DC

## 2014-07-17 NOTE — Progress Notes (Signed)
The patient complained of chronic back pain at a 7/10 at 0300 after receiving Tylenol at 0000.  Heat packs were placed on the patient's back.  The on-call cardiologist was notified.  New orders were given via telephone with read back for Q8H 50mg  Tramadol PRN.

## 2014-07-17 NOTE — Progress Notes (Signed)
ANTICOAGULATION CONSULT NOTE - Follow Up Consult  Pharmacy Consult for Coumadin Indication: atrial fibrillation  Allergies  Allergen Reactions  . Amoxicillin     REACTION: ha/nausea  . Diltiazem Hcl     REACTION: low bp  . Hydrocodone-Acetaminophen     REACTION: ha  . Niacin     REACTION: ha/nausea  . Niacin-Lovastatin Er     REACTION: ha/nausea  . Paroxetine     REACTION: ha/disoriented  . Propoxyphene N-Acetaminophen     REACTION: headache  . Pseudoephedrine     REACTION: stopped urine flow  . Sulfonamide Derivatives     REACTION: ha/nausea  . Tolterodine Tartrate     REACTION: bladder stopped    Patient Measurements: Height: 6' (182.9 cm) Weight: 187 lb 9.6 oz (85.095 kg) IBW/kg (Calculated) : 77.6  Vital Signs: Temp: 98.1 F (36.7 C) (05/20 0513) Temp Source: Oral (05/20 0513) BP: 102/57 mmHg (05/20 0513) Pulse Rate: 60 (05/20 0513)  Labs:  Recent Labs  07/14/14 1550 07/14/14 2011 07/15/14 0220 07/16/14 0410 07/17/14 0328  HGB 7.9*  --   --  7.8* 7.7*  HCT 23.9*  --   --  23.6* 23.1*  PLT 143*  --   --  144* 141*  APTT 50*  --   --   --   --   LABPROT 30.2*  --  30.6* 30.0* 26.8*  INR 2.86*  --  2.91* 2.83* 2.51*  CREATININE 6.15*  --  5.79* 5.41* 5.07*  TROPONINI 0.03 0.03 0.03  --   --     Estimated Creatinine Clearance: 12.1 mL/min (by C-G formula based on Cr of 5.07).  Assessment: 85yom on coumadin pta for afib, held on admission, and heparin was ordered to start when INR <2 pending RHC. Patient has now decided he does not want a cath so coumadin to resume. INR 2.51 with last coumadin dose taken at home on 5/17. Heparin never started.  Home dose: 1.25mg  daily except 2.5mg  on Monday  Goal of Therapy:  INR 2-3 Monitor platelets by anticoagulation protocol: Yes   Plan:  1) Coumadin 2.5mg  x 1 2) Daily INR  Deboraha Sprang 07/17/2014,10:41 AM

## 2014-07-17 NOTE — Progress Notes (Addendum)
Patient Name: Gary GARSIDE Sr. Date of Encounter: 07/17/2014  Primary Cardiologist: Dr. Stanford Breed   Principal Problem:   Right heart failure Active Problems:   Hyperlipidemia   Anxiety state   Essential hypertension   LBBB (left bundle branch block)   Permanent atrial fibrillation   CAD (coronary artery disease)   Valvular heart disease - moderate MR, mild AI, severe TR, mod PR by echo 05/2013   Pancreatic lesion   Long-term (current) use of anticoagulants   Acute on chronic systolic and diastolic heart failure, NYHA class 4   CKD (chronic kidney disease), stage IV    SUBJECTIVE  Had back pain overnight. IV lasix stopped. Kidney function improving slowly. Weight stable. No dyspnea.    CURRENT MEDS . atorvastatin  20 mg Oral q1800  . ferrous sulfate  325 mg Oral Q breakfast  . folic acid  1 mg Oral Daily  . sodium chloride  3 mL Intravenous Q12H    OBJECTIVE  Filed Vitals:   07/16/14 1300 07/16/14 1317 07/16/14 2054 07/17/14 0513  BP: 93/45 100/41 97/42 102/57  Pulse: 71  73 60  Temp:  97.7 F (36.5 C) 98 F (36.7 C) 98.1 F (36.7 C)  TempSrc: Oral Oral Oral Oral  Resp: 18  18 18   Height:      Weight:    85.095 kg (187 lb 9.6 oz)  SpO2: 99%  98% 96%    Intake/Output Summary (Last 24 hours) at 07/17/14 1007 Last data filed at 07/17/14 0851  Gross per 24 hour  Intake   1080 ml  Output   3775 ml  Net  -2695 ml   Filed Weights   07/15/14 0602 07/16/14 0650 07/17/14 0513  Weight: 89.449 kg (197 lb 3.2 oz) 84.777 kg (186 lb 14.4 oz) 85.095 kg (187 lb 9.6 oz)    PHYSICAL EXAM  General: Pleasant, NAD. Neuro: Alert and oriented X 3. Moves all extremities spontaneously. Psych: Normal affect. HEENT:  Normal  Neck: Supple without bruits.JVP 10 Lungs:  Resp regular and unlabored, CTA. Heart: Irregular. no s3, s4, or murmurs. Abdomen: Soft, non-tender, BS + x 4. Mildly distended Extremities: No clubbing, cyanosis. DP/PT/Radials 2+ and equal bilaterally. tr  pitting edema  Accessory Clinical Findings  CBC  Recent Labs  07/14/14 1550 07/16/14 0410 07/17/14 0328  WBC 3.1* 3.9* 3.7*  NEUTROABS 2.0  --   --   HGB 7.9* 7.8* 7.7*  HCT 23.9* 23.6* 23.1*  MCV 76.6* 77.6* 76.7*  PLT 143* 144* Q000111Q*   Basic Metabolic Panel  Recent Labs  07/14/14 1550  07/16/14 0410 07/17/14 0328  NA 134*  < > 135 137  K 4.9  < > 4.6 4.5  CL 96*  < > 96* 97*  CO2 28  < > 26 26  GLUCOSE 107*  < > 83 78  BUN 137*  < > 125* 122*  CREATININE 6.15*  < > 5.41* 5.07*  CALCIUM 9.2  < > 9.0 9.1  MG 2.9*  --   --   --   < > = values in this interval not displayed. Liver Function Tests  Recent Labs  07/14/14 1550  AST 22  ALT 10*  ALKPHOS 34*  BILITOT 0.4  PROT 6.1*  ALBUMIN 3.3*   Cardiac Enzymes  Recent Labs  07/14/14 1550 07/14/14 2011 07/15/14 0220  TROPONINI 0.03 0.03 0.03   Thyroid Function Tests  Recent Labs  07/14/14 1550  TSH 2.428    TELE A-fib with  HR 60-70s    ECG  A-fib with LBBB  Echocardiogram  LV EF: 55% -  60%  ------------------------------------------------------------------- Indications:   CHF - 428.0.  ------------------------------------------------------------------- History:  PMH: Severe TR. MR. AI. LBBB. Atrial fibrillation. Coronary artery disease. Risk factors: Dyslipidemia.  ------------------------------------------------------------------- Study Conclusions  - Left ventricle: The cavity size was normal. There was moderate concentric hypertrophy. Systolic function was normal. The estimated ejection fraction was in the range of 55% to 60%. The study is not technically sufficient to allow evaluation of LV diastolic function. - Ventricular septum: The basal ventricular septum is aneurysmal. - Aortic valve: Trileaflet. Sclerosis without stenosis. There was trivial regurgitation. - Mitral valve: Mildly thickened leaflets . There was trivial regurgitation. - Left atrium:  Massively dilated at 42 cm2. - Right ventricle: The cavity size was moderately dilated. - Right atrium: Massively dilated at 41 cm2. - Atrial septum: Aneurysmal IAS. Cannot exclude a PFO. 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 Annulus measures 4.53 cm. There was moderate to severe regurgitation. - Pulmonic valve: Poorly visualized. There was mild to moderate regurgitation. - Pulmonary arteries: PA peak pressure: 37 mm Hg (S). - Inferior vena cava: The vessel was dilated. The respirophasic diameter changes were blunted (< 50%), consistent with elevated central venous pressure. - Pericardium, extracardiac: Perihepatic ascites noted.     Radiology/Studies  Dg Chest 2 View  07/15/2014   CLINICAL DATA:  Shortness of breath.  EXAM: CHEST  2 VIEW  COMPARISON:  None.  FINDINGS: Mediastinum and hilar structures are normal. Prior CABG. Cardiomegaly. Pulmonary vascularity normal. No focal infiltrate. No pleural effusion. No pneumothorax. Tiny right pleural effusion versus pleural scar. No acute bony abnormality.  IMPRESSION: 1. Prior CABG.  Cardiomegaly.  No pulmonary venous congestion. 2.  Tiny right pleural effusion versus pleural scarring.   Electronically Signed   By: Marcello Moores  Register   On: 07/15/2014 07:48   US Abdomen Complete  07/15/2014   CLINICAL DATA:  Right heart failure, assess the degree of ascites. Known pancreatic cystic lesion. History of a cholecystectomy and hypertension.  EXAM: ULTRASOUND ABDOMEN COMPLETE  COMPARISON:  06/13/2013.  CT, 06/12/2013  FINDINGS: Gallbladder: Surgically absent  Common bile duct: Diameter: 3.6 mm  Liver: Fluid collection in the right lobe measuring 23 mm x 18 mm. No other liver lesion. Normal liver parenchymal echogenicity. Hepatopetal flow demonstrated in the portal vein.  IVC: Distended but widely patent.  Pancreas: Cyst or focal fluid collection  adjacent to the uncinate process or arising from it measuring 4.3 cm x 3.7 cm x 4.3 cm. Small hypoechoic or cystic lesion in the body measuring 9 mm. No other pancreatic lesions.  Spleen: Size and appearance within normal limits.  Right Kidney: Length: 11.0 cm. Echogenicity within normal limits. No mass or hydronephrosis visualized.  Left Kidney: Length: 11.4 cm. 2.6 cm upper pole simple cyst. 16 mm lower pole cyst. No other masses or lesions. Normal parenchymal echogenicity. No hydronephrosis.  Abdominal aorta: No aneurysm visualized.  Other findings: Small amount of ascites, significantly decreased from the previous day.  IMPRESSION: 1. Significant decrease in ascites, now small amount. 2. 2 cystic areas either within or adjacent to the pancreas. One small area appears to lie within the pancreatic body measuring 9 mm. A more simple appearing cyst or focal fluid collection hilar lies adjacent to or arises from the onset crosses the pancreas. 3. Small liver cyst. Small renal cysts on the left. Status post  cholecystectomy. No other abnormalities.   Electronically Signed   By: Lajean Manes M.D.   On: 07/15/2014 12:42   Nm Pulmonary Perf And Vent  07/15/2014   CLINICAL DATA:  Chronic persistent atrial fibrillation on Coumadin, coronary disease post CABG, LEFT ventricular dysfunction, now with RIGHT-side failure/ventricular dysfunction and dilatation, question chronic thromboembolic disease  EXAM: NUCLEAR MEDICINE VENTILATION - PERFUSION LUNG SCAN  TECHNIQUE: Ventilation images were obtained in multiple projections using inhaled aerosol Tc-35m DTPA. Perfusion images were obtained in multiple projections after intravenous injection of Tc-83m MAA.  RADIOPHARMACEUTICALS:  40 mCi Technetium-16m DTPA aerosol inhalation and 6 mCi Technetium-54m MAA IV  COMPARISON:  None; correlation chest radiograph 07/15/2014  FINDINGS: Ventilation: Abnormal appearance with numerous subsegmental ventilation defects throughout both lungs  greatest at lateral aspect of mid to lower RIGHT lung in both the RIGHT middle and RIGHT lower lobes. Mild elevation of RIGHT diaphragm and enlargement of cardiac silhouette.  Perfusion: Diffuse irregularity of perfusion throughout the periphery of both lungs with scattered tiny subsegmental perfusion defects, few RN less severe than the defects noted on the ventilation exam. Largest perfusion defect is seen at the RIGHT lower lobe matching the ventilatory finding. Reversal of MAA gradient greater anteriorly than posteriorly suggesting pulmonary arterial hypertension.  Chest radiograph: Enlargement of cardiac silhouette post CABG. Elevation of RIGHT diaphragm with underlying emphysematous changes but no acute infiltrate.  IMPRESSION: Markedly abnormal ventilation and to a lesser degree perfusion lung scans with multiple small peripheral subsegmental ventilatory and fewer perfusion defects throughout both lungs, greatest at lateral lower RIGHT lung, likely the result of parenchymal disease/COPD.  No perfusion defects for which ventilation appears normal.  Findings represent a low probability for pulmonary embolism.  Reversal of perfusion gradient suggestive of pulmonary arterial hypertension.   Electronically Signed   By: Lavonia Dana M.D.   On: 07/15/2014 14:27   US Paracentesis  07/15/2014   CLINICAL DATA:  Heart failure, chronic renal insufficiency. Ascites. Request for therapeutic paracentesis. Max volume not defined.  EXAM: ULTRASOUND GUIDED PARACENTESIS  COMPARISON:  None.  PROCEDURE: An ultrasound guided paracentesis was thoroughly discussed with the patient and questions answered. The benefits, risks, alternatives and complications were also discussed. The patient understands and wishes to proceed with the procedure. Written consent was obtained.  Ultrasound the abdomen demonstrates large volume of ascites.  Ultrasound was performed to localize and mark an adequate pocket of fluid in the left lower quadrant  of the abdomen. The area was then prepped and draped in the normal sterile fashion. 1% Lidocaine was used for local anesthesia. Under ultrasound guidance a 19 gauge Yueh catheter was introduced. Paracentesis was performed. The catheter was removed and a dressing applied.  COMPLICATIONS: None immediate  FINDINGS: A total of approximately 2 L of clear, dark yellow fluid was removed. A fluid sample was not sent for laboratory analysis.  IMPRESSION: Successful ultrasound guided paracentesis yielding 2 L of ascites. Repeat therapeutic paracentesis can be performed at physicians discretion.  Read by: Ascencion Dike PA-C   Electronically Signed   By: Sandi Mariscal M.D.   On: 07/15/2014 12:47    ASSESSMENT AND PLAN  1. Acute on chronic right heart failure  - Echo 07/14/2014 EF 55-60%, aneurysmal ventricular septum, massively dilated biatria, aneurysmal IAS, moderate to severe TR, perihepatic ascites noted            -VQ negative 2. Ascites, s/p paracentesis on 07/15/2014 3. Acute on chronic CKD, now stage IV-V  - Cr 2.11 nine month  ago --> 4.00 seven month ago --> 6.15 on arrival -> 5.4 4. CAD s/p CABG in 1995  - no anginal sx, serial trop negative 5. Permanent atrial fibrillation with LBBB  - Coreg held given BP and decompensating HF, however if HR increase, will try IV amiodarone 6. Microcytic anemia:            He has severe RHF with probable cardiorenal syndrome. Etiology of RV failure uncertain. Suspect may be ischemic due to previous inferior MI with remodeling but inferior wall looks of on echo. VQ negative for PE. Does have convincing story for OSA and his TR also complicates matters.  Renal size looks ok on u/s.   Currently volume status looks much improved and renal function continues to improve. Start po torsemide today. We discussed possibility of RHC but he is not interested.   Hopefully renal function will continue to improve as he is not candidate for HD due to severe RHF.  Will give IV  iron and transfuse 1 u RBC. Will need f/u with Nephrology after d/c and likely start aranesp  Hopefully home tomorrow with HF f/u.   Resume warfarin.   Bensimhon, Daniel,MD 10:07 AM

## 2014-07-17 NOTE — Progress Notes (Signed)
Physical Therapy Treatment Patient Details Name: Gary NEWMANN Sr. MRN: [id number] DOB: 05-03-1929 Today's Date: 07/17/2014    History of Present Illness 81 y.o. male admitted with acute on chronic Rt heart failure, ascites s/p paracentesis 5/18, and acute on chronic CKD with hx of Afib with LBBB.    PT Comments    Pt continues to making steady progress with mobility.  Continues to rely on RW for increased safety during gait and recommend this at home.  Note that family considering ALF, therefore called CSW and left message to meet with family as able regarding D/C plans.    Follow Up Recommendations  Home health PT;Supervision - Intermittent (vs ALF per family report)     Equipment Recommendations  None recommended by PT    Recommendations for Other Services       Precautions / Restrictions Precautions Precautions: Fall Restrictions Weight Bearing Restrictions: No    Mobility  Bed Mobility Overal bed mobility: Modified Independent             General bed mobility comments: HOB up, used rail  Transfers Overall transfer level: Needs assistance Equipment used: Rolling walker (2 wheeled) Transfers: Sit to/from Stand Sit to Stand: Supervision         General transfer comment: supervision for safety. VC for hand placement. No physical assist needed.  Ambulation/Gait Ambulation/Gait assistance: Supervision;Min guard Ambulation Distance (Feet): 150 Feet Assistive device: Rolling walker (2 wheeled) Gait Pattern/deviations: Step-through pattern;Decreased stride length;Trunk flexed Gait velocity: decreased   General Gait Details: Pt requires cues for safe use of RW and constant cues for upright head and trunk posture. No overt LOB during session but still relying on RW for for support during gait.    Stairs            Wheelchair Mobility    Modified Rankin (Stroke Patients Only)       Balance                                     Cognition Arousal/Alertness: Awake/alert;Lethargic Behavior During Therapy: WFL for tasks assessed/performed Overall Cognitive Status: Within Functional Limits for tasks assessed                      Exercises Other Exercises Other Exercises: standing hip abd x 10 reps, standing knee flex x 10 reps, standing heel raises x 10 reps, mini squats x 10 reps     General Comments        Pertinent Vitals/Pain      Home Living                      Prior Function            PT Goals (current goals can now be found in the care plan section) Acute Rehab PT Goals Patient Stated Goal: Go home with some help PT Goal Formulation: With patient Time For Goal Achievement: 07/30/14 Potential to Achieve Goals: Good Progress towards PT goals: Progressing toward goals    Frequency  Min 3X/week    PT Plan Current plan remains appropriate;Discharge plan needs to be updated    Co-evaluation             End of Session   Activity Tolerance: Patient tolerated treatment well Patient left: in chair;with call bell/phone within reach;with family/visitor present     Time: [id number] PT Time Calculation (  min) (ACUTE ONLY): 26 min  Charges:  $Gait Training: 8-22 mins $Therapeutic Exercise: 8-22 mins                    G Codes:      Denice Bors 07/17/2014, 4:23 PM

## 2014-07-17 NOTE — Evaluation (Signed)
Occupational Therapy Evaluation Patient Details Name: Gary TWIFORD Sr. MRN: [id number] DOB: June 27, 1929 Today's Date: 07/17/2014    History of Present Illness 81 y.o. male admitted with acute on chronic Rt heart failure, ascites s/p paracentesis 5/18, and acute on chronic CKD with hx of Afib with LBBB.   Clinical Impression   Pt was performing ADL at a modified to independent level prior to admission.  He primarily walks holding furniture and occasionally uses a cane at home.  Pt presents with weakness and impaired balance.  He is significantly more steady with use of RW and posture is improved. Daughter reports pt requiring +2 assist of family members this morning in order to stand up. Pt with concerns that pt and his wife do not have enough assistance at home for ADL and IADL. Family has applied for assistance through the [place]. Will follow acutely.      Follow Up Recommendations  No OT follow up    Equipment Recommendations  None recommended by OT    Recommendations for Other Services       Precautions / Restrictions Precautions Precautions: Fall Restrictions Weight Bearing Restrictions: No      Mobility Bed Mobility Overal bed mobility: Modified Independent             General bed mobility comments: HOB up, used rail  Transfers Overall transfer level: Needs assistance Equipment used: Rolling walker (2 wheeled) Transfers: Sit to/from Stand Sit to Stand: Supervision         General transfer comment: supervision for safety. VC for hand placement. No physical assist needed.    Balance     Sitting balance-Leahy Scale: Good       Standing balance-Leahy Scale: Fair                              ADL Overall ADL's : Needs assistance/impaired Eating/Feeding: Independent;Sitting   Grooming: Wash/dry hands;Supervision/safety;Standing   Upper Body Bathing: Set up;Sitting   Lower Body Bathing: Supervison/ safety;Sit to/from stand   Upper Body  Dressing : Set up;Sitting   Lower Body Dressing: Supervision/safety;Sit to/from stand   Toilet Transfer: Supervision/safety;RW;Ambulation   Toileting- Water quality scientist and Hygiene: Supervision/safety;Sit to/from stand       Functional mobility during ADLs: Supervision/safety;Rolling walker General ADL Comments: Pt easily able to cross his foot over opposite knee to donn and doff socks.  Wound on R leg reported to RN for monitoring, has had a hx of cellulitis.     Vision Additional Comments: does not drive due to cataracts   Perception     Praxis      Pertinent Vitals/Pain Pain Assessment: No/denies pain (has chronic back pain)     Hand Dominance Left   Extremity/Trunk Assessment Upper Extremity Assessment Upper Extremity Assessment: Overall WFL for tasks assessed   Lower Extremity Assessment Lower Extremity Assessment: Defer to PT evaluation   Cervical / Trunk Assessment Cervical / Trunk Assessment: Other exceptions Cervical / Trunk Exceptions: flexed posture, hx of back pain   Communication Communication Communication: HOH   Cognition Arousal/Alertness: Awake/alert Behavior During Therapy: WFL for tasks assessed/performed Overall Cognitive Status: Within Functional Limits for tasks assessed (slightly slow processing speed)                     General Comments       Exercises       Shoulder Instructions      Home Living Family/patient expects  to be discharged to:: Private residence Living Arrangements: Other relatives;Spouse/significant other (son) Available Help at Discharge: Family;Available 24 hours/day (wife cannot physically assist, son works days,) Type of Home: House Home Access: Stairs to enter [employer] of Steps: 1 Entrance Stairs-Rails: None Home Layout: Two level;Able to live on main level with bedroom/bathroom     Bathroom Shower/Tub: Occupational psychologist: Standard     Home Equipment: Environmental consultant - 2  wheels;Cane - single point;Shower seat;Grab bars - toilet;Grab bars - tub/shower (stair lift, shower seat is his wife's)          Prior Functioning/Environment Level of Independence: Independent with assistive device(s)        Comments: occasionally using a cane for mobility, otherwise furniture walks, stands to shower, does not drive typically, has a caregiver to drive wife to her hair appointment and pick up groceries    OT Diagnosis: Generalized weakness   OT Problem List: Impaired balance (sitting and/or standing)   OT Treatment/Interventions: Self-care/ADL training;DME and/or AE instruction;Patient/family education    OT Goals(Current goals can be found in the care plan section) Acute Rehab OT Goals Patient Stated Goal: Go home with some help OT Goal Formulation: With patient Time For Goal Achievement: 07/24/14 Potential to Achieve Goals: Good ADL Goals Pt Will Perform Grooming: with modified independence;standing Pt Will Transfer to Toilet: with modified independence;ambulating;regular height toilet;grab bars Pt Will Perform Toileting - Clothing Manipulation and hygiene: with modified independence;sit to/from stand Pt Will Perform Tub/Shower Transfer: Shower transfer;with modified independence;ambulating;rolling walker  OT Frequency: Min 2X/week   Barriers to D/C:            Co-evaluation              End of Session    Activity Tolerance: Patient tolerated treatment well Patient left: in bed;with call bell/phone within reach;with family/visitor present (seated at EOB)   Time: 1140-1155 OT Time Calculation (min): 15 min Charges:  OT General Charges $OT Visit: 1 Procedure OT Evaluation $Initial OT Evaluation Tier I: 1 Procedure G-Codes:    Malka So 07/17/2014, 12:27 PM  [phone]

## 2014-07-18 DIAGNOSIS — N189 Chronic kidney disease, unspecified: Secondary | ICD-10-CM

## 2014-07-18 DIAGNOSIS — D649 Anemia, unspecified: Secondary | ICD-10-CM | POA: Diagnosis present

## 2014-07-18 LAB — CBC
HCT: 25.2 % — ABNORMAL LOW (ref 39.0–52.0)
HEMOGLOBIN: 8.4 g/dL — AB (ref 13.0–17.0)
MCH: 26.4 pg (ref 26.0–34.0)
MCHC: 33.3 g/dL (ref 30.0–36.0)
MCV: 79.2 fL (ref 78.0–100.0)
PLATELETS: 126 10*3/uL — AB (ref 150–400)
RBC: 3.18 MIL/uL — ABNORMAL LOW (ref 4.22–5.81)
RDW: 17.5 % — ABNORMAL HIGH (ref 11.5–15.5)
WBC: 3.1 10*3/uL — ABNORMAL LOW (ref 4.0–10.5)

## 2014-07-18 LAB — BASIC METABOLIC PANEL
Anion gap: 14 (ref 5–15)
BUN: 103 mg/dL — ABNORMAL HIGH (ref 6–20)
CO2: 26 mmol/L (ref 22–32)
Calcium: 9 mg/dL (ref 8.9–10.3)
Chloride: 96 mmol/L — ABNORMAL LOW (ref 101–111)
Creatinine, Ser: 4.11 mg/dL — ABNORMAL HIGH (ref 0.61–1.24)
GFR calc Af Amer: 14 mL/min — ABNORMAL LOW (ref >60)
GFR calc non Af Amer: 12 mL/min — ABNORMAL LOW (ref >60)
Glucose, Bld: 87 mg/dL (ref 65–99)
Potassium: 4.4 mmol/L (ref 3.5–5.1)
Sodium: 136 mmol/L (ref 135–145)

## 2014-07-18 LAB — TYPE AND SCREEN
ABO/RH(D): O POS
Antibody Screen: NEGATIVE
UNIT DIVISION: 0

## 2014-07-18 LAB — PROTIME-INR
INR: 1.92 — ABNORMAL HIGH (ref 0.00–1.49)
PROTHROMBIN TIME: 21.9 s — AB (ref 11.6–15.2)

## 2014-07-18 MED ORDER — TORSEMIDE 20 MG PO TABS: 40.0000 mg | ORAL_TABLET | Freq: Two times a day (BID) | ORAL | Status: DC

## 2014-07-18 MED ORDER — WARFARIN SODIUM 2.5 MG PO TABS
2.5000 mg | ORAL_TABLET | Freq: Once | ORAL | Status: DC
Start: 2014-07-18 — End: 2014-07-18
  Filled 2014-07-18: qty 1

## 2014-07-18 NOTE — Progress Notes (Addendum)
ANTICOAGULATION CONSULT NOTE - Follow Up Consult  Pharmacy Consult for Coumadin Indication: atrial fibrillation  Allergies  Allergen Reactions  . Amoxicillin     REACTION: ha/nausea  . Diltiazem Hcl     REACTION: low bp  . Hydrocodone-Acetaminophen     REACTION: ha  . Niacin     REACTION: ha/nausea  . Niacin-Lovastatin Er     REACTION: ha/nausea  . Paroxetine     REACTION: ha/disoriented  . Propoxyphene N-Acetaminophen     REACTION: headache  . Pseudoephedrine     REACTION: stopped urine flow  . Sulfonamide Derivatives     REACTION: ha/nausea  . Tolterodine Tartrate     REACTION: bladder stopped    Patient Measurements: Height: 6' (182.9 cm) Weight: 187 lb 6.3 oz (85 kg) (Scale A) IBW/kg (Calculated) : 77.6  Vital Signs: Temp: 97.4 F (36.3 C) (05/21 0642) Temp Source: Oral (05/21 [id number]) BP: 108/64 mmHg (05/21 [id number]) Pulse Rate: 66 (05/21 0642)  Labs:  Recent Labs  07/16/14 0410 07/17/14 0328 07/17/14 2036 07/18/14 0318  HGB 7.8* 7.7* 9.3* 8.4*  HCT 23.6* 23.1* 27.7* 25.2*  PLT 144* 141* 120* 126*  LABPROT 30.0* 26.8*  --  21.9*  INR 2.83* 2.51*  --  1.92*  CREATININE 5.41* 5.07*  --  4.11*    Estimated Creatinine Clearance: 14.9 mL/min (by C-G formula based on Cr of 4.11).  Assessment: 85yom on coumadin pta for afib, held on admission, and heparin was ordered to start when INR <2 pending RHC. Patient has now decided he does not want a cath so coumadin to resume. INR 2.51 with last coumadin dose taken at home on 5/17. INR is subtherapeutic today so we're going to repeat the higher dose today.    Home dose: 1.25mg  daily except 2.5mg  on Monday  Goal of Therapy:  INR 2-3 Monitor platelets by anticoagulation protocol: Yes   Plan:   Coumadin 2.5mg  x 1 Daily INR If send home today, can resume home dose tomorrow  Onnie Boer, PharmD Pager: [phone] 07/18/2014 8:17 AM

## 2014-07-18 NOTE — Discharge Summary (Signed)
Patient ID: Devang Seccombe.,  MRN: [id number], DOB/AGE: 1929/12/01 81 y.o.  Admit date: 07/14/2014 Discharge date: 07/18/2014  Primary Care Provider: Garnet Koyanagi, DO Primary Cardiologist: [place]/ Dierdra Salameh  Discharge Diagnoses Principal Problem:   Acute on chronic systolic and diastolic heart failure, NYHA class 4 Active Problems:   Right heart failure   Permanent atrial fibrillation   CAD (coronary artery disease)   Valvular heart disease - moderate MR, mild AI, severe TR   CKD (chronic kidney disease), stage IV   Anemia-transfused 07/15/14   Hyperlipidemia   Essential hypertension-now hypotensive   LBBB (left bundle branch block)   Pancreatic lesion   Long-term (current) use of anticoagulants    Procedures: Echo 07/14/14                        VQ 07/15/14   Hospital Course:  81 y.o. male who has a hx of CAD, status post CABG in 1995, ischemic cardiomyopathy, systolic CHF, permanent atrial fibrillation on Coumadin, LBBB, HTN, HL, & CKD. Last echocardiogram in April 2015 showed an ejection fraction of 50-55%. There was severe left atrial enlargement, severe right ventricular enlargement, severe right atrial enlargement, severe tricuspid regurgitation, moderate PI, moderate mitral regurgitation and mild aortic insufficiency. Admitted in April of 2015 with ascites requiring paracentesis. This was felt to be secondary to right heart failure and valvular heart disease. Patient was treated with diuresis. An abdominal CT then showed a 3.6 cm complex cystic structure in the pancreas. Patient seen in followup by GI and further workup felt not indicated as it pancreatic cancer demonstrated he would not be a candidate for surgery.           He was seen in the office as an add on for worsening dyspnea and weakness. 07/14/14 and admitted for further evaluation. He was started on IV Lasix and seen by Dr Haroldine Laws. Echo showed preserved LVF but severe bi atrial enlargement and severe TR. VQ  was low probability. He was hypotensive and his Coreg and ACE were stopped. His SCr was also up to 4.0. He is not felt to be a candidate for HD. He was anemic and transfused one unit. Dr Haroldine Laws feels he can be discharge 07/18/14. He has a f/u scheduled in the CHF clinic for 07/28/14.Lasix was changed to Demadex BID at discharge.    Discharge Vitals:  Blood pressure 108/64, pulse 66, temperature 97.4 F (36.3 C), temperature source Oral, resp. rate 16, height 6' (1.829 m), weight 187 lb 6.3 oz (85 kg), SpO2 100 %.    Labs: Results for orders placed or performed during the hospital encounter of 07/14/14 (from the past 24 hour(s))  Type and screen     Status: None   Collection Time: 07/17/14 12:28 PM  Result Value Ref Range   ABO/RH(D) O POS    Antibody Screen NEG    Sample Expiration 07/20/2014    Unit Number [id number]    Blood Component Type RED CELLS,LR    Unit division 00    Status of Unit ISSUED,FINAL    Transfusion Status OK TO TRANSFUSE    Crossmatch Result Compatible   Prepare RBC     Status: None   Collection Time: 07/17/14 12:28 PM  Result Value Ref Range   Order Confirmation ORDER PROCESSED BY BLOOD BANK   ABO/Rh     Status: None   Collection Time: 07/17/14 12:28 PM  Result Value Ref Range   ABO/RH(D) Jenetta Downer  POS   CBC     Status: Abnormal   Collection Time: 07/17/14  8:36 PM  Result Value Ref Range   WBC 3.5 (L) 4.0 - 10.5 K/uL   RBC 3.51 (L) 4.22 - 5.81 MIL/uL   Hemoglobin 9.3 (L) 13.0 - 17.0 g/dL   HCT 27.7 (L) 39.0 - 52.0 %   MCV 78.9 78.0 - 100.0 fL   MCH 26.5 26.0 - 34.0 pg   MCHC 33.6 30.0 - 36.0 g/dL   RDW 17.4 (H) 11.5 - 15.5 %   Platelets 120 (L) 150 - 400 K/uL  Protime-INR     Status: Abnormal   Collection Time: 07/18/14  3:18 AM  Result Value Ref Range   Prothrombin Time 21.9 (H) 11.6 - 15.2 seconds   INR 1.92 (H) 0.00 - 1.49  CBC     Status: Abnormal   Collection Time: 07/18/14  3:18 AM  Result Value Ref Range   WBC 3.1 (L) 4.0 - 10.5 K/uL   RBC  3.18 (L) 4.22 - 5.81 MIL/uL   Hemoglobin 8.4 (L) 13.0 - 17.0 g/dL   HCT 25.2 (L) 39.0 - 52.0 %   MCV 79.2 78.0 - 100.0 fL   MCH 26.4 26.0 - 34.0 pg   MCHC 33.3 30.0 - 36.0 g/dL   RDW 17.5 (H) 11.5 - 15.5 %   Platelets 126 (L) 150 - 400 K/uL  Basic metabolic panel     Status: Abnormal   Collection Time: 07/18/14  3:18 AM  Result Value Ref Range   Sodium 136 135 - 145 mmol/L   Potassium 4.4 3.5 - 5.1 mmol/L   Chloride 96 (L) 101 - 111 mmol/L   CO2 26 22 - 32 mmol/L   Glucose, Bld 87 65 - 99 mg/dL   BUN 103 (H) 6 - 20 mg/dL   Creatinine, Ser 4.11 (H) 0.61 - 1.24 mg/dL   Calcium 9.0 8.9 - 10.3 mg/dL   GFR calc non Af Amer 12 (L) >60 mL/min   GFR calc Af Amer 14 (L) >60 mL/min   Anion gap 14 5 - 15    Disposition:      Follow-up Information    Follow up with Glori Bickers, MD. Daphane Shepherd on 07/28/2014.   Specialty:  Cardiology   Why:  at 0900 in the [facility]--gate code [id number]--Please bring all medications to appt   Contact information:   [place] [address] [phone]       Discharge Medications:    Medication List    STOP taking these medications        carvedilol 12.5 MG tablet  Commonly known as:  COREG     enalapril 20 MG tablet  Commonly known as:  VASOTEC     fenofibrate 54 MG tablet     furosemide 40 MG tablet  Commonly known as:  LASIX     triamcinolone cream 0.1 %  Commonly known as:  KENALOG     VITAMIN D (CHOLECALCIFEROL) PO      TAKE these medications        acetaminophen 500 MG tablet  Commonly known as:  TYLENOL  Take 1,000 mg by mouth 3 (three) times daily.     atorvastatin 20 MG tablet  Commonly known as:  LIPITOR  TAKE 1 TABLET BY MOUTH DAILY     clonazePAM 1 MG tablet  Commonly known as:  KLONOPIN  TAKE 1 TABLET BY MOUTH TWICE DAILY AS NEEDED FOR  ANXIETY     ferrous sulfate 325 (65 FE) MG tablet  Take 325 mg by mouth daily with breakfast.     folic acid 1 MG tablet  Commonly  known as:  FOLVITE  Take 1 mg by mouth daily.     GLUCOSAMINE CHONDROITIN JOINT PO  Take 1 tablet by mouth 2 (two) times daily.     lidocaine 2 % jelly  Commonly known as:  XYLOCAINE  Place 1 application into the urethra daily as needed (pain).     multivitamin capsule  Take 1 capsule by mouth daily.     neomycin-bacitracin-polymyxin ointment  Commonly known as:  NEOSPORIN  Apply 1 application topically daily as needed for wound care. apply to eye     ondansetron 8 MG tablet  Commonly known as:  ZOFRAN  TAKE 1 TABLET BY MOUTH EVERY 12 HOURS AS NEEDED FOR NAUSEA     OSTEO BI-FLEX JOINT SHIELD PO  Take 650 mg by mouth 2 (two) times daily.     torsemide 20 MG tablet  Commonly known as:  DEMADEX  Take 2 tablets (40 mg total) by mouth 2 (two) times daily.     traZODone 50 MG tablet  Commonly known as:  DESYREL  TAKE 1/2 TO 1 TABLET BY MOUTH EVERY NIGHT AT BEDTIME AS NEEDED FOR SLEEP     URIBEL 118 MG Caps  TAKE 1 TO 4 CAPSULE BY MOUTH EVERY DAY AS DIRECTED     warfarin 2.5 MG tablet  Commonly known as:  COUMADIN  TAKE 1 TABLET BY MOUTH DAILY; EXCEPT TAKE 1/2 TABLET ON WEDNESDAY         Duration of Discharge Encounter: Greater than 30 minutes including physician time.  Angelena Form PA-C 07/18/2014 12:03 PM   Patient seen and examined with Kerin Ransom, PA-C. We discussed all aspects of the encounter. I agree with the assessment and plan as stated above. He is ready for d/c home today. Will follow in HF Clinic for his RV failure. Delia Slatten,MD 11:44 PM

## 2014-07-18 NOTE — Care Management Note (Signed)
Case Management Note  Patient Details  Name: Gary [name]. MRN: [id number] Date of Birth: 1929/08/04  Subjective/Objective:                   back pain Action/Plan: Discharge planning  Expected Discharge Date:  07/18/14               Expected Discharge Plan:  [place]  In-House Referral:     Discharge planning Services  CM Consult  Post Acute Care Choice:  Resumption of Svcs/PTA Provider Choice offered to:  NA  DME Arranged:    DME Agency:     HH Arranged:  RN, Disease Management [place] Agency:  [place]  Status of Service:  Completed, signed off  Medicare Important Message Given:    Date Medicare IM Given:    Medicare IM give by:    Date Additional Medicare IM Given:    Additional Medicare Important Message give by:     If discussed at [place] of Stay Meetings, dates discussed:    Additional Comments: CM  Met with pt and family in room to confirm [facility] agency; family confirms pt receives [facility] chronic catheter care from [facility].  CM called [facility] and received callback from [place] requesting orders, DC summary, and H&P be faxed to [phone].  CM faxed requested information.  No other CM needs were communicated.   Dellie Catholic, RN 07/18/2014, 2:40 PM

## 2014-07-18 NOTE — Progress Notes (Addendum)
Patient Name: Gary GETTMAN Sr. Date of Encounter: 07/18/2014  Primary Cardiologist: Dr. Stanford Breed   Principal Problem:   Right heart failure Active Problems:   Hyperlipidemia   Anxiety state   Essential hypertension   LBBB (left bundle branch block)   Permanent atrial fibrillation   CAD (coronary artery disease)   Valvular heart disease - moderate MR, mild AI, severe TR, mod PR by echo 05/2013   Pancreatic lesion   Long-term (current) use of anticoagulants   Acute on chronic systolic and diastolic heart failure, NYHA class 4   CKD (chronic kidney disease), stage IV    SUBJECTIVE  Got 1u RBCs last night. Feeling better. Eager to go home. Renal function improving. Weight stable.    CURRENT MEDS . atorvastatin  20 mg Oral q1800  . ferrous sulfate  325 mg Oral Q breakfast  . folic acid  1 mg Oral Daily  . sodium chloride  3 mL Intravenous Q12H  . torsemide  40 mg Oral Daily  . warfarin  2.5 mg Oral ONCE-1800  . Warfarin - Pharmacist Dosing Inpatient   Does not apply q1800    OBJECTIVE  Filed Vitals:   07/17/14 1843 07/17/14 2142 07/18/14 0238 07/18/14 0642  BP: 109/59 101/51 105/65 108/64  Pulse: 71 70 68 66  Temp: 97.5 F (36.4 C) 97.6 F (36.4 C) 97.5 F (36.4 C) 97.4 F (36.3 C)  TempSrc: Oral Oral Oral Oral  Resp: 18 16 16 16   Height:      Weight:    85 kg (187 lb 6.3 oz)  SpO2: 100% 100% 100% 100%    Intake/Output Summary (Last 24 hours) at 07/18/14 0859 Last data filed at 07/18/14 0239  Gross per 24 hour  Intake    930 ml  Output   2076 ml  Net  -1146 ml   Filed Weights   07/16/14 0650 07/17/14 0513 07/18/14 0642  Weight: 84.777 kg (186 lb 14.4 oz) 85.095 kg (187 lb 9.6 oz) 85 kg (187 lb 6.3 oz)    PHYSICAL EXAM  General: Pleasant, NAD. Neuro: Alert and oriented X 3. Moves all extremities spontaneously. Psych: Normal affect. HEENT:  Normal  Neck: Supple without bruits.JVP 10 Lungs:  Resp regular and unlabored, CTA. Heart: Irregular. no  s3, s4, or murmurs. Abdomen: Soft, non-tender, BS + x 4. Mildly distended Extremities: No clubbing, cyanosis. DP/PT/Radials 2+ and equal bilaterally. tr pitting edema  Accessory Clinical Findings  CBC  Recent Labs  07/17/14 2036 07/18/14 0318  WBC 3.5* 3.1*  HGB 9.3* 8.4*  HCT 27.7* 25.2*  MCV 78.9 79.2  PLT 120* 123XX123*   Basic Metabolic Panel  Recent Labs  07/17/14 0328 07/18/14 0318  NA 137 136  K 4.5 4.4  CL 97* 96*  CO2 26 26  GLUCOSE 78 87  BUN 122* 103*  CREATININE 5.07* 4.11*  CALCIUM 9.1 9.0   Liver Function Tests No results for input(s): AST, ALT, ALKPHOS, BILITOT, PROT, ALBUMIN in the last 72 hours. Cardiac Enzymes No results for input(s): CKTOTAL, CKMB, CKMBINDEX, TROPONINI in the last 72 hours. Thyroid Function Tests No results for input(s): TSH, T4TOTAL, T3FREE, THYROIDAB in the last 72 hours.  Invalid input(s): FREET3  TELE A-fib with HR 60-70s    ECG  A-fib with LBBB  Echocardiogram  LV EF: 55% -  60%  ------------------------------------------------------------------- Indications:   CHF - 428.0.  ------------------------------------------------------------------- History:  PMH: Severe TR. MR. AI. LBBB. Atrial fibrillation. Coronary artery disease. Risk factors: Dyslipidemia.  -------------------------------------------------------------------  Study Conclusions  - Left ventricle: The cavity size was normal. There was moderate concentric hypertrophy. Systolic function was normal. The estimated ejection fraction was in the range of 55% to 60%. The study is not technically sufficient to allow evaluation of LV diastolic function. - Ventricular septum: The basal ventricular septum is aneurysmal. - Aortic valve: Trileaflet. Sclerosis without stenosis. There was trivial regurgitation. - Mitral valve: Mildly thickened leaflets . There was trivial regurgitation. - Left atrium: Massively dilated at 42 cm2. - Right  ventricle: The cavity size was moderately dilated. - Right atrium: Massively dilated at 41 cm2. - Atrial septum: Aneurysmal IAS. Cannot exclude a PFO. 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 Annulus measures 4.53 cm. There was moderate to severe regurgitation. - Pulmonic valve: Poorly visualized. There was mild to moderate regurgitation. - Pulmonary arteries: PA peak pressure: 37 mm Hg (S). - Inferior vena cava: The vessel was dilated. The respirophasic diameter changes were blunted (< 50%), consistent with elevated central venous pressure. - Pericardium, extracardiac: Perihepatic ascites noted.     Radiology/Studies  Dg Chest 2 View  07/15/2014   CLINICAL DATA:  Shortness of breath.  EXAM: CHEST  2 VIEW  COMPARISON:  None.  FINDINGS: Mediastinum and hilar structures are normal. Prior CABG. Cardiomegaly. Pulmonary vascularity normal. No focal infiltrate. No pleural effusion. No pneumothorax. Tiny right pleural effusion versus pleural scar. No acute bony abnormality.  IMPRESSION: 1. Prior CABG.  Cardiomegaly.  No pulmonary venous congestion. 2.  Tiny right pleural effusion versus pleural scarring.   Electronically Signed   By: Marcello Moores  Register   On: 07/15/2014 07:48   US Abdomen Complete  07/15/2014   CLINICAL DATA:  Right heart failure, assess the degree of ascites. Known pancreatic cystic lesion. History of a cholecystectomy and hypertension.  EXAM: ULTRASOUND ABDOMEN COMPLETE  COMPARISON:  06/13/2013.  CT, 06/12/2013  FINDINGS: Gallbladder: Surgically absent  Common bile duct: Diameter: 3.6 mm  Liver: Fluid collection in the right lobe measuring 23 mm x 18 mm. No other liver lesion. Normal liver parenchymal echogenicity. Hepatopetal flow demonstrated in the portal vein.  IVC: Distended but widely patent.  Pancreas: Cyst or focal fluid collection adjacent to the uncinate process or  arising from it measuring 4.3 cm x 3.7 cm x 4.3 cm. Small hypoechoic or cystic lesion in the body measuring 9 mm. No other pancreatic lesions.  Spleen: Size and appearance within normal limits.  Right Kidney: Length: 11.0 cm. Echogenicity within normal limits. No mass or hydronephrosis visualized.  Left Kidney: Length: 11.4 cm. 2.6 cm upper pole simple cyst. 16 mm lower pole cyst. No other masses or lesions. Normal parenchymal echogenicity. No hydronephrosis.  Abdominal aorta: No aneurysm visualized.  Other findings: Small amount of ascites, significantly decreased from the previous day.  IMPRESSION: 1. Significant decrease in ascites, now small amount. 2. 2 cystic areas either within or adjacent to the pancreas. One small area appears to lie within the pancreatic body measuring 9 mm. A more simple appearing cyst or focal fluid collection hilar lies adjacent to or arises from the onset crosses the pancreas. 3. Small liver cyst. Small renal cysts on the left. Status post cholecystectomy. No other abnormalities.   Electronically Signed   By: Lajean Manes M.D.   On: 07/15/2014 12:42   Nm Pulmonary Perf And Vent  07/15/2014   CLINICAL DATA:  Chronic persistent atrial fibrillation on Coumadin, coronary disease post CABG, LEFT ventricular dysfunction,  now with RIGHT-side failure/ventricular dysfunction and dilatation, question chronic thromboembolic disease  EXAM: NUCLEAR MEDICINE VENTILATION - PERFUSION LUNG SCAN  TECHNIQUE: Ventilation images were obtained in multiple projections using inhaled aerosol Tc-87m DTPA. Perfusion images were obtained in multiple projections after intravenous injection of Tc-72m MAA.  RADIOPHARMACEUTICALS:  40 mCi Technetium-64m DTPA aerosol inhalation and 6 mCi Technetium-87m MAA IV  COMPARISON:  None; correlation chest radiograph 07/15/2014  FINDINGS: Ventilation: Abnormal appearance with numerous subsegmental ventilation defects throughout both lungs greatest at lateral aspect of mid to  lower RIGHT lung in both the RIGHT middle and RIGHT lower lobes. Mild elevation of RIGHT diaphragm and enlargement of cardiac silhouette.  Perfusion: Diffuse irregularity of perfusion throughout the periphery of both lungs with scattered tiny subsegmental perfusion defects, few RN less severe than the defects noted on the ventilation exam. Largest perfusion defect is seen at the RIGHT lower lobe matching the ventilatory finding. Reversal of MAA gradient greater anteriorly than posteriorly suggesting pulmonary arterial hypertension.  Chest radiograph: Enlargement of cardiac silhouette post CABG. Elevation of RIGHT diaphragm with underlying emphysematous changes but no acute infiltrate.  IMPRESSION: Markedly abnormal ventilation and to a lesser degree perfusion lung scans with multiple small peripheral subsegmental ventilatory and fewer perfusion defects throughout both lungs, greatest at lateral lower RIGHT lung, likely the result of parenchymal disease/COPD.  No perfusion defects for which ventilation appears normal.  Findings represent a low probability for pulmonary embolism.  Reversal of perfusion gradient suggestive of pulmonary arterial hypertension.   Electronically Signed   By: Lavonia Dana M.D.   On: 07/15/2014 14:27   US Paracentesis  07/15/2014   CLINICAL DATA:  Heart failure, chronic renal insufficiency. Ascites. Request for therapeutic paracentesis. Max volume not defined.  EXAM: ULTRASOUND GUIDED PARACENTESIS  COMPARISON:  None.  PROCEDURE: An ultrasound guided paracentesis was thoroughly discussed with the patient and questions answered. The benefits, risks, alternatives and complications were also discussed. The patient understands and wishes to proceed with the procedure. Written consent was obtained.  Ultrasound the abdomen demonstrates large volume of ascites.  Ultrasound was performed to localize and mark an adequate pocket of fluid in the left lower quadrant of the abdomen. The area was then  prepped and draped in the normal sterile fashion. 1% Lidocaine was used for local anesthesia. Under ultrasound guidance a 19 gauge Yueh catheter was introduced. Paracentesis was performed. The catheter was removed and a dressing applied.  COMPLICATIONS: None immediate  FINDINGS: A total of approximately 2 L of clear, dark yellow fluid was removed. A fluid sample was not sent for laboratory analysis.  IMPRESSION: Successful ultrasound guided paracentesis yielding 2 L of ascites. Repeat therapeutic paracentesis can be performed at physicians discretion.  Read by: Ascencion Dike PA-C   Electronically Signed   By: Sandi Mariscal M.D.   On: 07/15/2014 12:47    ASSESSMENT AND PLAN  1. Acute on chronic right heart failure  - Echo 07/14/2014 EF 55-60%, aneurysmal ventricular septum, massively dilated biatria, aneurysmal IAS, moderate to severe TR, perihepatic ascites noted            -VQ negative 2. Ascites, s/p paracentesis on 07/15/2014 3. Acute on chronic CKD, now stage IV-V  - Cr 2.11 nine month ago --> 4.00 seven month ago --> 6.15 on arrival -> 5.4 4. CAD s/p CABG in 1995  - no anginal sx, serial trop negative 5. Permanent atrial fibrillation with LBBB  - Coreg held given BP and decompensating HF, however if HR increase, will try  IV amiodarone 6. Microcytic anemia: (iron-def)            - got feraheme and 1u RBC on 5/20            He has severe RHF with probable cardiorenal syndrome. Etiology of RV failure uncertain. Suspect may be ischemic due to previous inferior MI with remodeling but inferior wall looks of on echo. VQ negative for PE. Does have convincing story for OSA and his TR also complicates matters.  Renal size looks ok on u/s.   Currently volume status looks much improved and renal function continues to improve. Will send home on torsemide 40 po bid. (stop lasix) We discussed possibility of RHC but he is not interested. If weight 185 or less skip afternoon dose of torsemide.   Renal  function continues to improve. Probably  not candidate for HD due to severe RHF. Will need f/u with Nephrology arranged  after d/c and likely start aranesp. (he has never seen them).   Warfarin resumed yesterday. Would not restart carvedilol or enalapril at d/c. We can reassess in HF Clinic on 5/31.   Demetria Iwai,MD 8:59 AM

## 2014-07-19 ENCOUNTER — Other Ambulatory Visit: Payer: Self-pay | Admitting: Family Medicine

## 2014-07-20 ENCOUNTER — Telehealth: Payer: Self-pay | Admitting: *Deleted

## 2014-07-20 ENCOUNTER — Telehealth: Payer: Self-pay | Admitting: Cardiology

## 2014-07-20 NOTE — Telephone Encounter (Signed)
Transition Care Management Follow-up Telephone Call   How have you been since you were released from the hospital? YES- per son patient has lost a lot of weight in fluid, is breathing and walking better    Do you understand why you were in the hospital? YES    Do you understand the discharge instrcutions? YES- patient is to strictly monitor his fluids.   Items Reviewed:  Medications reviewed: unable to review at this time   Allergies reviewed: YES   Dietary changes reviewed: Low sodium diet, kidney diet- son requested nutritionist from [facility]   Referrals reviewed: YES- patient has appointment with Cardiology    Functional Questionnaire:   Activities of Daily Living (ADLs):   He states they are independent in the following: ambulating, feeding, restroom, dressing, bathing States they require assistance with the following: cooking, medication, transportation (lives with son)    Any transportation issues/concerns?:  NO- son will be driving him Friday    Any patient concerns? Son requesting referral to nephrologist   Confirmed importance and date/time of follow-up visits scheduled: YES- scheduled with Dr. Etter Sjogren 5/27 at 11:30.   Confirmed with patient if condition begins to worsen call PCP or go to the ER.  Patient was given the Call-a-Nurse line [phone]: YES

## 2014-07-20 NOTE — Telephone Encounter (Signed)
Pt needs a TOC phone call  Thanks

## 2014-07-20 NOTE — Telephone Encounter (Signed)
Patient contacted regarding discharge from [place]. on 07/18/14.  Patient understands to follow up with provider Dr Haroldine Laws on 07/28/14 at 9 AM at [place]. Patient understands discharge instructions? yes  Patient understands medications and regiment? yes Patient understands to bring all medications to this visit? Yes  SPOKE TO WIFE. [facility]. WIFE WAS AWARE OF APPOINTMENT. SHE STATES HE COULD NOT COME TO THE PHONE AT THE MOMENT.

## 2014-07-22 ENCOUNTER — Ambulatory Visit: Payer: Medicare Other | Admitting: Medical

## 2014-07-22 ENCOUNTER — Telehealth: Payer: Self-pay | Admitting: *Deleted

## 2014-07-22 ENCOUNTER — Other Ambulatory Visit: Payer: Self-pay | Admitting: Family Medicine

## 2014-07-22 NOTE — Telephone Encounter (Signed)
Apparently now cardiology is in charge of INRs. Refer request to cardiology

## 2014-07-22 NOTE — Telephone Encounter (Signed)
INR on 07/18/14 was 1.92.  Please advise refill:  Medication name:  Name from pharmacy:  warfarin (COUMADIN) 2.5 MG tablet WARFARIN SOD 2.5MG  TABLETS (GREEN)     Sig: TAKE 1 TABLET BY MOUTH EVERY DAY EXCEPT 1/2 TABLET BY MOUTH ON WEDNESDAY    Dispense: 90 tablet   Refills: 0   Start: 07/22/2014   Class: Normal    Requested on: 07/22/2014    Originally ordered on: 05/25/2010 04/27/2014

## 2014-07-23 NOTE — Telephone Encounter (Signed)
Refer to coumadin clinic Gary [name]

## 2014-07-24 ENCOUNTER — Ambulatory Visit (INDEPENDENT_AMBULATORY_CARE_PROVIDER_SITE_OTHER): Payer: Medicare Other | Admitting: Family Medicine

## 2014-07-24 ENCOUNTER — Telehealth: Payer: Self-pay | Admitting: *Deleted

## 2014-07-24 ENCOUNTER — Encounter: Payer: Self-pay | Admitting: Family Medicine

## 2014-07-24 VITALS — BP 113/74 | HR 90 | Temp 98.1°F | Wt 172.8 lb

## 2014-07-24 DIAGNOSIS — F4323 Adjustment disorder with mixed anxiety and depressed mood: Secondary | ICD-10-CM

## 2014-07-24 DIAGNOSIS — F4321 Adjustment disorder with depressed mood: Secondary | ICD-10-CM | POA: Diagnosis not present

## 2014-07-24 DIAGNOSIS — R112 Nausea with vomiting, unspecified: Secondary | ICD-10-CM

## 2014-07-24 DIAGNOSIS — E785 Hyperlipidemia, unspecified: Secondary | ICD-10-CM | POA: Diagnosis not present

## 2014-07-24 DIAGNOSIS — I5042 Chronic combined systolic (congestive) and diastolic (congestive) heart failure: Secondary | ICD-10-CM

## 2014-07-24 DIAGNOSIS — N289 Disorder of kidney and ureter, unspecified: Secondary | ICD-10-CM | POA: Diagnosis not present

## 2014-07-24 DIAGNOSIS — I4891 Unspecified atrial fibrillation: Secondary | ICD-10-CM

## 2014-07-24 DIAGNOSIS — N184 Chronic kidney disease, stage 4 (severe): Secondary | ICD-10-CM | POA: Diagnosis not present

## 2014-07-24 LAB — LIPID PANEL
CHOLESTEROL: 63 mg/dL (ref 0–200)
HDL: 23.4 mg/dL — AB (ref >39.00)
LDL Cholesterol: 23 mg/dL (ref 0–99)
NONHDL: 39.6
Total CHOL/HDL Ratio: 3
Triglycerides: 85 mg/dL (ref 0.0–149.0)
VLDL: 17 mg/dL (ref 0.0–40.0)

## 2014-07-24 LAB — BASIC METABOLIC PANEL
BUN: 50 mg/dL — AB (ref 6–23)
CALCIUM: 10.1 mg/dL (ref 8.4–10.5)
CHLORIDE: 99 meq/L (ref 96–112)
CO2: 32 meq/L (ref 19–32)
Creatinine, Ser: 2.47 mg/dL — ABNORMAL HIGH (ref 0.40–1.50)
GFR: 26.69 mL/min — ABNORMAL LOW (ref >60.00)
GLUCOSE: 116 mg/dL — AB (ref 70–99)
POTASSIUM: 3.6 meq/L (ref 3.5–5.1)
Sodium: 138 mEq/L (ref 135–145)

## 2014-07-24 LAB — HEPATIC FUNCTION PANEL
ALBUMIN: 4.1 g/dL (ref 3.5–5.2)
ALT: 12 U/L (ref 0–53)
AST: 24 U/L (ref 0–37)
Alkaline Phosphatase: 39 U/L (ref 39–117)
Bilirubin, Direct: 0.3 mg/dL (ref 0.0–0.3)
TOTAL PROTEIN: 7.4 g/dL (ref 6.0–8.3)
Total Bilirubin: 0.7 mg/dL (ref 0.2–1.2)

## 2014-07-24 MED ORDER — WARFARIN SODIUM 2.5 MG PO TABS: ORAL_TABLET | ORAL | Status: DC

## 2014-07-24 MED ORDER — ATORVASTATIN CALCIUM 20 MG PO TABS: 20.0000 mg | ORAL_TABLET | Freq: Every day | ORAL | Status: DC

## 2014-07-24 MED ORDER — MIRTAZAPINE 15 MG PO TABS: 15.0000 mg | ORAL_TABLET | Freq: Every day | ORAL | Status: DC

## 2014-07-24 MED ORDER — ONDANSETRON HCL 8 MG PO TABS: 8.0000 mg | ORAL_TABLET | Freq: Three times a day (TID) | ORAL | Status: DC | PRN

## 2014-07-24 NOTE — Telephone Encounter (Signed)
Spoke with wife, will fill warfarin x 1 month, patient will most likely be checking INRs at HF clinic.  Has appt next week with MD there. Wife voiced understanding

## 2014-07-24 NOTE — Patient Instructions (Signed)
Insomnia Insomnia is frequent trouble falling and/or staying asleep. Insomnia can be a long term problem or a short term problem. Both are common. Insomnia can be a short term problem when the wakefulness is related to a certain stress or worry. Long term insomnia is often related to ongoing stress during waking hours and/or poor sleeping habits. Overtime, sleep deprivation itself can make the problem worse. Every little thing feels more severe because you are overtired and your ability to cope is decreased. CAUSES   Stress, anxiety, and depression.  Poor sleeping habits.  Distractions such as TV in the bedroom.  Naps close to bedtime.  Engaging in emotionally charged conversations before bed.  Technical reading before sleep.  Alcohol and other sedatives. They may make the problem worse. They can hurt normal sleep patterns and normal dream activity.  Stimulants such as caffeine for several hours prior to bedtime.  Pain syndromes and shortness of breath can cause insomnia.  Exercise late at night.  Changing time zones may cause sleeping problems (jet lag). It is sometimes helpful to have someone observe your sleeping patterns. They should look for periods of not breathing during the night (sleep apnea). They should also look to see how long those periods last. If you live alone or observers are uncertain, you can also be observed at a sleep clinic where your sleep patterns will be professionally monitored. Sleep apnea requires a checkup and treatment. Give your caregivers your medical history. Give your caregivers observations your family has made about your sleep.  SYMPTOMS   Not feeling rested in the morning.  Anxiety and restlessness at bedtime.  Difficulty falling and staying asleep. TREATMENT   Your caregiver may prescribe treatment for an underlying medical disorders. Your caregiver can give advice or help if you are using alcohol or other drugs for self-medication. Treatment  of underlying problems will usually eliminate insomnia problems.  Medications can be prescribed for short time use. They are generally not recommended for lengthy use.  Over-the-counter sleep medicines are not recommended for lengthy use. They can be habit forming.  You can promote easier sleeping by making lifestyle changes such as:  Using relaxation techniques that help with breathing and reduce muscle tension.  Exercising earlier in the day.  Changing your diet and the time of your last meal. No night time snacks.  Establish a regular time to go to bed.  Counseling can help with stressful problems and worry.  Soothing music and white noise may be helpful if there are background noises you cannot remove.  Stop tedious detailed work at least one hour before bedtime. HOME CARE INSTRUCTIONS   Keep a diary. Inform your caregiver about your progress. This includes any medication side effects. See your caregiver regularly. Take note of:  Times when you are asleep.  Times when you are awake during the night.  The quality of your sleep.  How you feel the next day. This information will help your caregiver care for you.  Get out of bed if you are still awake after 15 minutes. Read or do some quiet activity. Keep the lights down. Wait until you feel sleepy and go back to bed.  Keep regular sleeping and waking hours. Avoid naps.  Exercise regularly.  Avoid distractions at bedtime. Distractions include watching television or engaging in any intense or detailed activity like attempting to balance the household checkbook.  Develop a bedtime ritual. Keep a familiar routine of bathing, brushing your teeth, climbing into bed at the same   time each night, listening to soothing music. Routines increase the success of falling to sleep faster.  Use relaxation techniques. This can be using breathing and muscle tension release routines. It can also include visualizing peaceful scenes. You can  also help control troubling or intruding thoughts by keeping your mind occupied with boring or repetitive thoughts like the old concept of counting sheep. You can make it more creative like imagining planting one beautiful flower after another in your backyard garden.  During your day, work to eliminate stress. When this is not possible use some of the previous suggestions to help reduce the anxiety that accompanies stressful situations. MAKE SURE YOU:   Understand these instructions.  Will watch your condition.  Will get help right away if you are not doing well or get worse. Document Released: 02/11/2000 Document Revised: 05/08/2011 Document Reviewed: 03/13/2007 [employer] Patient Information 2015 [employer]. This information is not intended to replace advice given to you by your health care provider. Make sure you discuss any questions you have with your health care provider.  

## 2014-07-24 NOTE — Progress Notes (Signed)
Pre visit review using our clinic review tool, if applicable. No additional management support is needed unless otherwise documented below in the visit note. 

## 2014-07-24 NOTE — Telephone Encounter (Signed)
Signed order for INR faxed to [facility] faxed.

## 2014-07-25 NOTE — Assessment & Plan Note (Signed)
Per cardiology 

## 2014-07-25 NOTE — Assessment & Plan Note (Signed)
Pt with decreased appetite and anxiety with depression He is also struggling to sleep at night.  remeron

## 2014-07-25 NOTE — Assessment & Plan Note (Signed)
Pt never went to nephrology Will put referral in again at pt sons request Pt has said he does not wish to have any heroic measures.  No cpr, no dialysis-- we discussed DNR but pt was not willing to make decision at this point.   Pt was willing to talk with palliative Care

## 2014-07-25 NOTE — Progress Notes (Signed)
Patient ID: Gary Lope Sr., male    DOB: Aug 10, 1929  Age: 81 y.o. MRN: [id number]    Subjective:  Subjective HPI Gary DENOVA Sr. presents for f/u from hospital 07/18/2014-- CKD stage 4, chronic combined systolic and diastolic, CAD.  Review of Systems  Constitutional: Negative for diaphoresis, appetite change, fatigue and unexpected weight change.  Eyes: Negative for pain, redness and visual disturbance.  Respiratory: Negative for cough, chest tightness, shortness of breath and wheezing.   Cardiovascular: Negative for chest pain, palpitations and leg swelling.  Endocrine: Negative for cold intolerance, heat intolerance, polydipsia, polyphagia and polyuria.  Genitourinary: Negative for dysuria, frequency and difficulty urinating.  Neurological: Negative for dizziness, light-headedness, numbness and headaches.  Psychiatric/Behavioral: Positive for sleep disturbance and dysphoric mood. Negative for suicidal ideas and self-injury. The patient is nervous/anxious.     History Past Medical History  Diagnosis Date  . Neurogenic bladder     has had bladder catheter changes at the urology office every 12 weeks  . Anemia   . Hyperlipidemia   . Hypertension   . Heart attack     1983  . Chronic systolic heart failure     a. prior EF 20-30%;  b. Echo 7/09: EF 35-40%, basal inferoposterior HK, mild AI, mild to moderate MR, moderate to marked LAE, mild to moderate TR, mildly increased PASP, moderate RAE;   c. Echo 01/2012:   mild LVH, EF 50%, Tr AI, mild MR, severe LAE, mod to severe RAE, mod to severe RVE, mod reduced RVSF, mod to severe TR, PASP 46  . Arrhythmia     afib  . Warfarin anticoagulation   . GERD (gastroesophageal reflux disease)   . Anxiety   . Renal failure   . CHF (congestive heart failure)   . Coronary artery disease     He has past surgical history that includes Appendectomy; Cholecystectomy; and Coronary artery bypass graft.   His family history includes Breast  cancer in his sister; Cancer in his mother; Heart attack in his father.He reports that he has quit smoking. He has never used smokeless tobacco. He reports that he does not drink alcohol or use illicit drugs.  Current Outpatient Prescriptions on File Prior to Visit  Medication Sig Dispense Refill  . acetaminophen (TYLENOL) 500 MG tablet Take 1,000 mg by mouth 3 (three) times daily.    . clonazePAM (KLONOPIN) 1 MG tablet TAKE 1 TABLET BY MOUTH TWICE DAILY AS NEEDED FOR ANXIETY 60 tablet 0  . ferrous sulfate 325 (65 FE) MG tablet Take 325 mg by mouth daily with breakfast.     . folic acid (FOLVITE) 1 MG tablet Take 1 mg by mouth daily.      . Glucos-Chondroit-Hyaluron-MSM (GLUCOSAMINE CHONDROITIN JOINT PO) Take 1 tablet by mouth 2 (two) times daily.    Marland Kitchen lidocaine (XYLOCAINE) 2 % jelly Place 1 application into the urethra daily as needed (pain).    . Meth-Hyo-M Bl-Na Phos-Ph Sal (URIBEL) 118 MG CAPS TAKE 1 TO 4 CAPSULE BY MOUTH EVERY DAY AS DIRECTED 120 capsule 5  . Misc Natural Products (OSTEO BI-FLEX JOINT SHIELD PO) Take 650 mg by mouth 2 (two) times daily.    . Multiple Vitamin (MULTIVITAMIN) capsule Take 1 capsule by mouth daily.      Marland Kitchen neomycin-bacitracin-polymyxin (NEOSPORIN) ointment Apply 1 application topically daily as needed for wound care. apply to eye    . torsemide (DEMADEX) 20 MG tablet Take 2 tablets (40 mg total) by mouth 2 (two) times  daily. 60 tablet 11  . traZODone (DESYREL) 50 MG tablet TAKE 1/2 TO 1 TABLET BY MOUTH EVERY NIGHT AT BEDTIME AS NEEDED FOR SLEEP 30 tablet 0   No current facility-administered medications on file prior to visit.     Objective:  Objective Physical Exam  Constitutional: He appears well-developed and well-nourished. No distress.  Cardiovascular: Normal rate, regular rhythm and normal heart sounds.   Pulmonary/Chest: Effort normal and breath sounds normal. No respiratory distress.  Psychiatric: His behavior is normal. Judgment and thought content  normal. His mood appears anxious. Cognition and memory are normal. He exhibits a depressed mood.   BP 113/74 mmHg  Pulse 90  Temp(Src) 98.1 F (36.7 C) (Oral)  Wt 172 lb 12.8 oz (78.382 kg)  SpO2 97% Wt Readings from Last 3 Encounters:  07/24/14 172 lb 12.8 oz (78.382 kg)  07/18/14 187 lb 6.3 oz (85 kg)  07/14/14 203 lb 12.8 oz (92.443 kg)     Lab Results  Component Value Date   WBC 3.1* 07/18/2014   HGB 8.4* 07/18/2014   HCT 25.2* 07/18/2014   PLT 126* 07/18/2014   GLUCOSE 116* 07/24/2014   CHOL 63 07/24/2014   TRIG 85.0 07/24/2014   HDL 23.40* 07/24/2014   LDLCALC 23 07/24/2014   ALT 12 07/24/2014   AST 24 07/24/2014   NA 138 07/24/2014   K 3.6 07/24/2014   CL 99 07/24/2014   CREATININE 2.47* 07/24/2014   BUN 50* 07/24/2014   CO2 32 07/24/2014   TSH 2.428 07/14/2014   PSA 13.12* 10/20/2011   INR 1.92* 07/18/2014    Dg Chest 2 View  07/15/2014   CLINICAL DATA:  Shortness of breath.  EXAM: CHEST  2 VIEW  COMPARISON:  None.  FINDINGS: Mediastinum and hilar structures are normal. Prior CABG. Cardiomegaly. Pulmonary vascularity normal. No focal infiltrate. No pleural effusion. No pneumothorax. Tiny right pleural effusion versus pleural scar. No acute bony abnormality.  IMPRESSION: 1. Prior CABG.  Cardiomegaly.  No pulmonary venous congestion. 2.  Tiny right pleural effusion versus pleural scarring.   Electronically Signed   By: Marcello Moores  Register   On: 07/15/2014 07:48   US Abdomen Complete  07/15/2014   CLINICAL DATA:  Right heart failure, assess the degree of ascites. Known pancreatic cystic lesion. History of a cholecystectomy and hypertension.  EXAM: ULTRASOUND ABDOMEN COMPLETE  COMPARISON:  06/13/2013.  CT, 06/12/2013  FINDINGS: Gallbladder: Surgically absent  Common bile duct: Diameter: 3.6 mm  Liver: Fluid collection in the right lobe measuring 23 mm x 18 mm. No other liver lesion. Normal liver parenchymal echogenicity. Hepatopetal flow demonstrated in the portal vein.   IVC: Distended but widely patent.  Pancreas: Cyst or focal fluid collection adjacent to the uncinate process or arising from it measuring 4.3 cm x 3.7 cm x 4.3 cm. Small hypoechoic or cystic lesion in the body measuring 9 mm. No other pancreatic lesions.  Spleen: Size and appearance within normal limits.  Right Kidney: Length: 11.0 cm. Echogenicity within normal limits. No mass or hydronephrosis visualized.  Left Kidney: Length: 11.4 cm. 2.6 cm upper pole simple cyst. 16 mm lower pole cyst. No other masses or lesions. Normal parenchymal echogenicity. No hydronephrosis.  Abdominal aorta: No aneurysm visualized.  Other findings: Small amount of ascites, significantly decreased from the previous day.  IMPRESSION: 1. Significant decrease in ascites, now small amount. 2. 2 cystic areas either within or adjacent to the pancreas. One small area appears to lie within the pancreatic body measuring 9  mm. A more simple appearing cyst or focal fluid collection hilar lies adjacent to or arises from the onset crosses the pancreas. 3. Small liver cyst. Small renal cysts on the left. Status post cholecystectomy. No other abnormalities.   Electronically Signed   By: Lajean Manes M.D.   On: 07/15/2014 12:42   Nm Pulmonary Perf And Vent  07/15/2014   CLINICAL DATA:  Chronic persistent atrial fibrillation on Coumadin, coronary disease post CABG, LEFT ventricular dysfunction, now with RIGHT-side failure/ventricular dysfunction and dilatation, question chronic thromboembolic disease  EXAM: NUCLEAR MEDICINE VENTILATION - PERFUSION LUNG SCAN  TECHNIQUE: Ventilation images were obtained in multiple projections using inhaled aerosol Tc-80m DTPA. Perfusion images were obtained in multiple projections after intravenous injection of Tc-89m MAA.  RADIOPHARMACEUTICALS:  40 mCi Technetium-42m DTPA aerosol inhalation and 6 mCi Technetium-34m MAA IV  COMPARISON:  None; correlation chest radiograph 07/15/2014  FINDINGS: Ventilation: Abnormal  appearance with numerous subsegmental ventilation defects throughout both lungs greatest at lateral aspect of mid to lower RIGHT lung in both the RIGHT middle and RIGHT lower lobes. Mild elevation of RIGHT diaphragm and enlargement of cardiac silhouette.  Perfusion: Diffuse irregularity of perfusion throughout the periphery of both lungs with scattered tiny subsegmental perfusion defects, few RN less severe than the defects noted on the ventilation exam. Largest perfusion defect is seen at the RIGHT lower lobe matching the ventilatory finding. Reversal of MAA gradient greater anteriorly than posteriorly suggesting pulmonary arterial hypertension.  Chest radiograph: Enlargement of cardiac silhouette post CABG. Elevation of RIGHT diaphragm with underlying emphysematous changes but no acute infiltrate.  IMPRESSION: Markedly abnormal ventilation and to a lesser degree perfusion lung scans with multiple small peripheral subsegmental ventilatory and fewer perfusion defects throughout both lungs, greatest at lateral lower RIGHT lung, likely the result of parenchymal disease/COPD.  No perfusion defects for which ventilation appears normal.  Findings represent a low probability for pulmonary embolism.  Reversal of perfusion gradient suggestive of pulmonary arterial hypertension.   Electronically Signed   By: Lavonia Dana M.D.   On: 07/15/2014 14:27   US Paracentesis  07/15/2014   CLINICAL DATA:  Heart failure, chronic renal insufficiency. Ascites. Request for therapeutic paracentesis. Max volume not defined.  EXAM: ULTRASOUND GUIDED PARACENTESIS  COMPARISON:  None.  PROCEDURE: An ultrasound guided paracentesis was thoroughly discussed with the patient and questions answered. The benefits, risks, alternatives and complications were also discussed. The patient understands and wishes to proceed with the procedure. Written consent was obtained.  Ultrasound the abdomen demonstrates large volume of ascites.  Ultrasound was  performed to localize and mark an adequate pocket of fluid in the left lower quadrant of the abdomen. The area was then prepped and draped in the normal sterile fashion. 1% Lidocaine was used for local anesthesia. Under ultrasound guidance a 19 gauge Yueh catheter was introduced. Paracentesis was performed. The catheter was removed and a dressing applied.  COMPLICATIONS: None immediate  FINDINGS: A total of approximately 2 L of clear, dark yellow fluid was removed. A fluid sample was not sent for laboratory analysis.  IMPRESSION: Successful ultrasound guided paracentesis yielding 2 L of ascites. Repeat therapeutic paracentesis can be performed at physicians discretion.  Read by: Ascencion Dike PA-C   Electronically Signed   By: Sandi Mariscal M.D.   On: 07/15/2014 12:47     Assessment & Plan:  Plan I have discontinued Gary [name]'s ondansetron. I have also changed his ondansetron and atorvastatin. Additionally, I am having him start on mirtazapine. Lastly, I am  having him maintain his multivitamin, folic acid, ferrous sulfate, acetaminophen, lidocaine, Glucos-Chondroit-Hyaluron-MSM (GLUCOSAMINE CHONDROITIN JOINT PO), Misc Natural Products (OSTEO BI-FLEX JOINT SHIELD PO), traZODone, URIBEL, clonazePAM, neomycin-bacitracin-polymyxin, torsemide, and warfarin.  Meds ordered this encounter  Medications  . ondansetron (ZOFRAN) 8 MG tablet    Sig: Take 1 tablet (8 mg total) by mouth every 8 (eight) hours as needed for nausea or vomiting.    Dispense:  90 tablet    Refill:  1  . mirtazapine (REMERON) 15 MG tablet    Sig: Take 1 tablet (15 mg total) by mouth at bedtime.    Dispense:  30 tablet    Refill:  2  . atorvastatin (LIPITOR) 20 MG tablet    Sig: Take 1 tablet (20 mg total) by mouth daily.    Dispense:  30 tablet    Refill:  0    Needs an appointment  . warfarin (COUMADIN) 2.5 MG tablet    Sig: Take 1 tablet by mouth daily or as directed by coumadin clinic    Dispense:  30 tablet    Refill:  0     Problem List Items Addressed This Visit    Hyperlipidemia   Relevant Medications   atorvastatin (LIPITOR) 20 MG tablet   warfarin (COUMADIN) 2.5 MG tablet   Other Relevant Orders   Hepatic function panel (Completed)   Lipid panel (Completed)   CKD (chronic kidney disease), stage IV    Pt never went to nephrology Will put referral in again at pt sons request Pt has said he does not wish to have any heroic measures.  No cpr, no dialysis-- we discussed DNR but pt was not willing to make decision at this point.   Pt was willing to talk with palliative Care      Chronic combined systolic and diastolic heart failure    Per cardiology      Relevant Medications   atorvastatin (LIPITOR) 20 MG tablet   warfarin (COUMADIN) 2.5 MG tablet   Adjustment disorder with mixed anxiety and depressed mood    Pt with decreased appetite and anxiety with depression He is also struggling to sleep at night.  remeron         Other Visit Diagnoses    Non-intractable vomiting with nausea, vomiting of unspecified type    -  Primary    Relevant Medications    ondansetron (ZOFRAN) 8 MG tablet    Renal insufficiency        Chronic renal failure, stage 4 (severe)        Relevant Orders    Ambulatory referral to Nephrology    Amb Referral to Palliative Care    Amb ref to Medical Nutrition Therapy-MNT    Basic metabolic panel (Completed)    Adjustment disorder with depressed mood        Relevant Medications    mirtazapine (REMERON) 15 MG tablet    Other Relevant Orders    Ambulatory referral to Psychology    Atrial fibrillation, unspecified        Relevant Medications    atorvastatin (LIPITOR) 20 MG tablet    warfarin (COUMADIN) 2.5 MG tablet       Follow-up: Return in about 6 months (around 01/24/2015), or if symptoms worsen or fail to improve, for hyperlipidemia, hypertension, fasting.  Garnet Koyanagi, DO

## 2014-07-28 ENCOUNTER — Ambulatory Visit (HOSPITAL_COMMUNITY)
Admission: RE | Admit: 2014-07-28 | Discharge: 2014-07-28 | Disposition: A | Discharge status: Home / Self Care | Payer: Medicare Other | Source: Ambulatory Visit | Attending: Cardiology | Admitting: Cardiology

## 2014-07-28 ENCOUNTER — Encounter (HOSPITAL_COMMUNITY): Payer: Self-pay

## 2014-07-28 VITALS — BP 104/62 | HR 78 | Wt 173.5 lb

## 2014-07-28 DIAGNOSIS — N183 Chronic kidney disease, stage 3 unspecified: Secondary | ICD-10-CM

## 2014-07-28 DIAGNOSIS — Z951 Presence of aortocoronary bypass graft: Secondary | ICD-10-CM | POA: Diagnosis not present

## 2014-07-28 DIAGNOSIS — I5043 Acute on chronic combined systolic (congestive) and diastolic (congestive) heart failure: Secondary | ICD-10-CM | POA: Diagnosis not present

## 2014-07-28 DIAGNOSIS — I38 Endocarditis, valve unspecified: Secondary | ICD-10-CM | POA: Diagnosis not present

## 2014-07-28 DIAGNOSIS — I482 Chronic atrial fibrillation: Secondary | ICD-10-CM | POA: Diagnosis not present

## 2014-07-28 DIAGNOSIS — I129 Hypertensive chronic kidney disease with stage 1 through stage 4 chronic kidney disease, or unspecified chronic kidney disease: Secondary | ICD-10-CM | POA: Diagnosis not present

## 2014-07-28 DIAGNOSIS — I4821 Permanent atrial fibrillation: Secondary | ICD-10-CM

## 2014-07-28 DIAGNOSIS — I2510 Atherosclerotic heart disease of native coronary artery without angina pectoris: Secondary | ICD-10-CM | POA: Insufficient documentation

## 2014-07-28 DIAGNOSIS — R188 Other ascites: Secondary | ICD-10-CM | POA: Insufficient documentation

## 2014-07-28 DIAGNOSIS — I5042 Chronic combined systolic (congestive) and diastolic (congestive) heart failure: Secondary | ICD-10-CM

## 2014-07-28 DIAGNOSIS — E785 Hyperlipidemia, unspecified: Secondary | ICD-10-CM | POA: Insufficient documentation

## 2014-07-28 DIAGNOSIS — I5032 Chronic diastolic (congestive) heart failure: Secondary | ICD-10-CM | POA: Diagnosis not present

## 2014-07-28 LAB — BRAIN NATRIURETIC PEPTIDE: B Natriuretic Peptide: 463 pg/mL — ABNORMAL HIGH (ref 0.0–100.0)

## 2014-07-28 LAB — BASIC METABOLIC PANEL
Anion gap: 12 (ref 5–15)
BUN: 47 mg/dL — ABNORMAL HIGH (ref 6–20)
CO2: 27 mmol/L (ref 22–32)
CREATININE: 2.62 mg/dL — AB (ref 0.61–1.24)
Calcium: 9.9 mg/dL (ref 8.9–10.3)
Chloride: 99 mmol/L — ABNORMAL LOW (ref 101–111)
GFR calc Af Amer: 24 mL/min — ABNORMAL LOW (ref >60)
GFR calc non Af Amer: 21 mL/min — ABNORMAL LOW (ref >60)
GLUCOSE: 87 mg/dL (ref 65–99)
Potassium: 4.6 mmol/L (ref 3.5–5.1)
Sodium: 138 mmol/L (ref 135–145)

## 2014-07-28 LAB — PROTIME-INR
INR: 1.67 — AB (ref 0.00–1.49)
PROTHROMBIN TIME: 19.7 s — AB (ref 11.6–15.2)

## 2014-07-28 NOTE — Progress Notes (Signed)
Patient ID: [name], male   DOB: [birth year], 81 y.o.   MRN: [id number] PCP: Dr Etter Sjogren Primary Cardiologist: Dr. Stanford Breed  81 yo with history of CAD s/p CABG in 1995, chronic atrial fibrillation, CKD, and chronic diastolic CHF with prominent RV failure presents for CHF clinic evaluation.  Patient has struggled with right-sided failure. He was admitted in 5/16 with abdominal distention, volume overload, and elevated creatinine as high as 6.  Echo showed normal LV EF but RV was moderately dilated with moderate-severe TR, ?Ebsteins anomaly anatomy.  He had a paracentesis for ascites.  V/Q scan was negative for PE. He refused RHC while in the hospital.  He was diuresed and creatinine improved, most recently creatinine was 2.47.   He is now back at home.  His breathing is better.  He can walk 100-200 yards without dyspnea.  He has a chair lift to get up the stairs at home but can climb a short flight of steps without problems.  He has been sleeping in a recliner for 3 years.  No PND.  His hobby is woodworking, which he is able to do without problems.  No chest pain.  +low back pain.  His heart rate is controlled without nodal blockers.   ECG: atrial fibrillation with LBBB  Labs (5/16): K 3.6, creatinine 6.15 => 2.47, LFTs normal, LDL 23, HCT 25.2  PMH:  1. CAD: MI 1983, CABG 1995.   2. Chronic diastolic failure with prominent RV failure: Echo (5/16) with EF 55-60%, moderate LV, moderately dilated RV, massive biatrial enlargement, moderate-severe TR with some question of Ebstein's anomaly, PA systolic pressure 37 mmHg.  Patient has had ascites likely related to RV failure with h/o paracentesis.  3. CKD 4. Permanent atrial fibrillation 5. LBBB 6. Hyperlipidemia 7. HTN 8. Complex pancreatic cyst: Followed by GI.  9. Neurogenic bladder: Chronic foley.  10. Anemia of renal disease.  11. Cholecystectomy 12. Appendectomy  SH: Married, nonsmoker, 4 kids, lives with son.   FH: CAD  ROS: All  systems reviewed and negative except as per HPI.   Current Outpatient Prescriptions  Medication Sig Dispense Refill  . acetaminophen (TYLENOL) 500 MG tablet Take 1,000 mg by mouth 3 (three) times daily.    Marland Kitchen atorvastatin (LIPITOR) 20 MG tablet Take 1 tablet (20 mg total) by mouth daily. 30 tablet 0  . clonazePAM (KLONOPIN) 1 MG tablet TAKE 1 TABLET BY MOUTH TWICE DAILY AS NEEDED FOR ANXIETY 60 tablet 0  . ferrous sulfate 325 (65 FE) MG tablet Take 325 mg by mouth daily with breakfast.     . folic acid (FOLVITE) 1 MG tablet Take 1 mg by mouth daily.      . Glucos-Chondroit-Hyaluron-MSM (GLUCOSAMINE CHONDROITIN JOINT PO) Take 1 tablet by mouth 2 (two) times daily.    Marland Kitchen lidocaine (XYLOCAINE) 2 % jelly Place 1 application into the urethra daily as needed (pain).    . Meth-Hyo-M Bl-Na Phos-Ph Sal (URIBEL) 118 MG CAPS TAKE 1 TO 4 CAPSULE BY MOUTH EVERY DAY AS DIRECTED 120 capsule 5  . mirtazapine (REMERON) 15 MG tablet Take 1 tablet (15 mg total) by mouth at bedtime. 30 tablet 2  . Misc Natural Products (OSTEO BI-FLEX JOINT SHIELD PO) Take 650 mg by mouth 2 (two) times daily.    . Multiple Vitamin (MULTIVITAMIN) capsule Take 1 capsule by mouth daily.      Marland Kitchen neomycin-bacitracin-polymyxin (NEOSPORIN) ointment Apply 1 application topically daily as needed for wound care. apply to eye    .  ondansetron (ZOFRAN) 8 MG tablet Take 1 tablet (8 mg total) by mouth every 8 (eight) hours as needed for nausea or vomiting. 90 tablet 1  . torsemide (DEMADEX) 20 MG tablet Take 2 tablets (40 mg total) by mouth 2 (two) times daily. 60 tablet 11  . traMADol (ULTRAM) 50 MG tablet Take by mouth every 6 (six) hours as needed.    . traZODone (DESYREL) 50 MG tablet TAKE 1/2 TO 1 TABLET BY MOUTH EVERY NIGHT AT BEDTIME AS NEEDED FOR SLEEP 30 tablet 0  . warfarin (COUMADIN) 2.5 MG tablet Take 1 tablet by mouth daily or as directed by coumadin clinic 30 tablet 0   No current facility-administered medications for this encounter.    BP 104/62 mmHg  Pulse 78  Wt 173 lb 8 oz (78.699 kg)  SpO2 98% General: NAD Neck: JVP 8 cm, no thyromegaly or thyroid nodule.  Lungs: Clear to auscultation bilaterally with normal respiratory effort. CV: Nondisplaced PMI.  Heart irregular S1/S2, no S3/S4, 2/6 HSM LLSB.  Trace bilateral ankle edema.  No carotid bruit.  Normal pedal pulses.  Abdomen: Soft, nontender, no hepatosplenomegaly, mild distention.  Skin: Intact without lesions or rashes.  Neurologic: Alert and oriented x 3.  Psych: Normal affect. Extremities: No clubbing or cyanosis.  HEENT: Normal.   Assessment/Plan: 1. Atrial fibrillation: Permanent.  Markedly dilated atria on echo, unlikely to stay in NSR if DCCV were to be attempted. He is on warfarin.  He has not been on any nodal blockers and HR is reasonably controlled.  2. Chronic diastolic CHF with prominent RV failure: On exam, probably has mild volume overload.  NYHA class II-III symptoms. Much improved compared to prior to hospitalization.  - Continue torsemide 40 mg bid.  - BMET/BNP today.  3. Tricuspid regurgitation: Moderate to severe TR.  There was question of possible Ebstein's anomaly on echo.  I will review the echo.  Regardless, will be managed medically. Need to keep decongested with torsemide.  4. Ascites: Suspect this has been due hepatic congestion from RV failure.  LFTs normal most recently.  5. CKD: Creatinine much improved but still tenuous.  Suspect cardiorenal component.  He needs followup with nephrology, I will arrange.  HD would be difficult with RV failure.  6. CAD: s/p CABG.  No chest pain.  Continue statin (recent LDL excellent).  No ASA with use of warfarin and stable CAD.   Followup in 3 wks  [name] 07/28/2014

## 2014-07-28 NOTE — Patient Instructions (Signed)
Will refer you to [facility] with Dr. Pearson Grippe [facility] [place] [address] [phone]  Will refer you to [facility] at [place] [address] [place], [place] [address] [phone]  Follow up 3 weeks.  Do the following things EVERYDAY: 1) Weigh yourself in the morning before breakfast. Write it down and keep it in a log. 2) Take your medicines as prescribed 3) Eat low salt foods-Limit salt (sodium) to 2000 mg per day.  4) Stay as active as you can everyday 5) Limit all fluids for the day to less than 2 liters

## 2014-07-31 ENCOUNTER — Telehealth: Payer: Self-pay | Admitting: Family Medicine

## 2014-07-31 ENCOUNTER — Ambulatory Visit (INDEPENDENT_AMBULATORY_CARE_PROVIDER_SITE_OTHER): Payer: Medicare Other | Admitting: *Deleted

## 2014-07-31 DIAGNOSIS — Z7901 Long term (current) use of anticoagulants: Secondary | ICD-10-CM | POA: Diagnosis not present

## 2014-07-31 DIAGNOSIS — I4821 Permanent atrial fibrillation: Secondary | ICD-10-CM

## 2014-07-31 DIAGNOSIS — R112 Nausea with vomiting, unspecified: Secondary | ICD-10-CM

## 2014-07-31 DIAGNOSIS — I482 Chronic atrial fibrillation: Secondary | ICD-10-CM | POA: Diagnosis not present

## 2014-07-31 LAB — POCT INR: INR: 1.3

## 2014-07-31 NOTE — Telephone Encounter (Signed)
I made Gary [name] aware and he verbalized understanding, he will have his parents call psych. The referral has been placed.      KP

## 2014-07-31 NOTE — Telephone Encounter (Signed)
Please advise      KP 

## 2014-07-31 NOTE — Telephone Encounter (Signed)
Caller name: [name] Relation to pt: son Call back number: [phone] Pharmacy:  Reason for call:   Wanting to know if referral could be place to [facility]? They would prefer in home care.  Also, thought that Dr. Etter Sjogren was going to refer patient to a psychologist?

## 2014-07-31 NOTE — Patient Instructions (Signed)

## 2014-07-31 NOTE — Telephone Encounter (Signed)
I believe I gave them numbers for psych---they want the pt or family to call Ok to refer to [facility]

## 2014-08-03 ENCOUNTER — Other Ambulatory Visit: Payer: Self-pay | Admitting: Family Medicine

## 2014-08-03 ENCOUNTER — Telehealth: Payer: Self-pay | Admitting: Family Medicine

## 2014-08-03 DIAGNOSIS — N19 Unspecified kidney failure: Secondary | ICD-10-CM

## 2014-08-03 DIAGNOSIS — I4891 Unspecified atrial fibrillation: Secondary | ICD-10-CM

## 2014-08-03 NOTE — Telephone Encounter (Signed)
To MD for review     KP 

## 2014-08-03 NOTE — Telephone Encounter (Signed)
Caller name: Olivia Mackie with [facility] Can be reached: [phone]  Reason for call: They received orders for Nutrition but they cannot provide nutrition as a stand alone order. Nursing or other services would have to be ordered for pt.

## 2014-08-03 NOTE — Telephone Encounter (Signed)
I thought I put in for nursing-- new referral in

## 2014-08-04 ENCOUNTER — Telehealth: Payer: Self-pay | Admitting: Family Medicine

## 2014-08-04 NOTE — Telephone Encounter (Signed)
Caller name: Lattie Haw with [facility] Ph#: [phone]  Reason for call: Please call about the referral. Pt states that he only uses [facility]. They did not start care. She is asking for a return call.

## 2014-08-04 NOTE — Telephone Encounter (Signed)
Spoke with Lattie Haw and I made her aware that the patient is receiving services from [facility] and I apologized for the inconvenience. I faxed the order to [facility].      KP

## 2014-08-05 NOTE — Telephone Encounter (Signed)
Requesting that nutrition referral be sent to [facility]. Needing in home service.

## 2014-08-05 NOTE — Telephone Encounter (Signed)
I faxed it yesterday.     KP

## 2014-08-06 ENCOUNTER — Ambulatory Visit (INDEPENDENT_AMBULATORY_CARE_PROVIDER_SITE_OTHER): Payer: Medicare Other | Admitting: Cardiovascular Disease

## 2014-08-06 DIAGNOSIS — I4821 Permanent atrial fibrillation: Secondary | ICD-10-CM

## 2014-08-06 DIAGNOSIS — Z7901 Long term (current) use of anticoagulants: Secondary | ICD-10-CM

## 2014-08-06 DIAGNOSIS — I482 Chronic atrial fibrillation: Secondary | ICD-10-CM

## 2014-08-06 LAB — POCT INR: INR: 1.3

## 2014-08-10 ENCOUNTER — Encounter: Payer: Self-pay | Admitting: *Deleted

## 2014-08-10 NOTE — Telephone Encounter (Signed)
This encounter was created in error - please disregard.

## 2014-08-11 ENCOUNTER — Telehealth: Payer: Self-pay | Admitting: Family Medicine

## 2014-08-11 NOTE — Telephone Encounter (Signed)
Caller name: Latina from [facility]  Relation to pt: RN  Call back number: [phone]    Reason for call:   Requesting verbal orders pertaining to how often urine cathter needs to be changed. Please advise

## 2014-08-11 NOTE — Telephone Encounter (Signed)
hes been having this done for years-- I thought urology did original orders , I thought How have they been doing it?

## 2014-08-11 NOTE — Telephone Encounter (Signed)
Dr.Lowne please advise      KP

## 2014-08-12 NOTE — Telephone Encounter (Signed)
LaTonya from [facility] called to follow up.  Dr. Nonda Lou note was discussed with her.  Virgilio Belling states that they have been changing catheter as needed.  She states that upon assessment, catheter does not look as if it needs to be changed and patient is not exhibiting any concerning symptoms.  She says she was calling for orders so that catheter changes can be added to calender.  She was advised to contact pt's urologist for catheter orders and to call back with further questions and concerns.  She states understanding and agreed.

## 2014-08-13 ENCOUNTER — Ambulatory Visit (INDEPENDENT_AMBULATORY_CARE_PROVIDER_SITE_OTHER): Payer: Medicare Other | Admitting: Pharmacist

## 2014-08-13 DIAGNOSIS — I4821 Permanent atrial fibrillation: Secondary | ICD-10-CM

## 2014-08-13 DIAGNOSIS — Z7901 Long term (current) use of anticoagulants: Secondary | ICD-10-CM

## 2014-08-13 DIAGNOSIS — I482 Chronic atrial fibrillation: Secondary | ICD-10-CM

## 2014-08-13 LAB — POCT INR: INR: 1.1

## 2014-08-18 ENCOUNTER — Ambulatory Visit: Payer: Medicare Other | Admitting: Nurse Practitioner

## 2014-08-19 ENCOUNTER — Telehealth: Payer: Self-pay | Admitting: *Deleted

## 2014-08-19 ENCOUNTER — Encounter (HOSPITAL_COMMUNITY): Payer: Medicare Other

## 2014-08-19 DIAGNOSIS — N184 Chronic kidney disease, stage 4 (severe): Secondary | ICD-10-CM | POA: Diagnosis not present

## 2014-08-19 DIAGNOSIS — I5043 Acute on chronic combined systolic (congestive) and diastolic (congestive) heart failure: Secondary | ICD-10-CM | POA: Diagnosis not present

## 2014-08-19 DIAGNOSIS — I255 Ischemic cardiomyopathy: Secondary | ICD-10-CM | POA: Diagnosis not present

## 2014-08-19 DIAGNOSIS — I130 Hypertensive heart and chronic kidney disease with heart failure and stage 1 through stage 4 chronic kidney disease, or unspecified chronic kidney disease: Secondary | ICD-10-CM | POA: Diagnosis not present

## 2014-08-19 NOTE — Telephone Encounter (Signed)
Home health certification and plan of care received via fax from [facility]. Forwarded to Dr. Etter Sjogren. JG//CMA

## 2014-08-20 ENCOUNTER — Ambulatory Visit (INDEPENDENT_AMBULATORY_CARE_PROVIDER_SITE_OTHER): Payer: Medicare Other | Admitting: Internal Medicine

## 2014-08-20 DIAGNOSIS — Z7901 Long term (current) use of anticoagulants: Secondary | ICD-10-CM

## 2014-08-20 DIAGNOSIS — I4821 Permanent atrial fibrillation: Secondary | ICD-10-CM

## 2014-08-20 DIAGNOSIS — I482 Chronic atrial fibrillation: Secondary | ICD-10-CM

## 2014-08-20 LAB — POCT INR: INR: 1.2

## 2014-08-21 ENCOUNTER — Ambulatory Visit (HOSPITAL_COMMUNITY)
Admission: RE | Admit: 2014-08-21 | Discharge: 2014-08-21 | Disposition: A | Discharge status: Home / Self Care | Payer: Medicare Other | Source: Ambulatory Visit | Attending: Internal Medicine | Admitting: Internal Medicine

## 2014-08-21 VITALS — BP 142/78 | HR 83 | Wt 173.5 lb

## 2014-08-21 DIAGNOSIS — I2510 Atherosclerotic heart disease of native coronary artery without angina pectoris: Secondary | ICD-10-CM | POA: Diagnosis not present

## 2014-08-21 DIAGNOSIS — Z79899 Other long term (current) drug therapy: Secondary | ICD-10-CM | POA: Insufficient documentation

## 2014-08-21 DIAGNOSIS — N319 Neuromuscular dysfunction of bladder, unspecified: Secondary | ICD-10-CM | POA: Insufficient documentation

## 2014-08-21 DIAGNOSIS — I482 Chronic atrial fibrillation: Secondary | ICD-10-CM | POA: Diagnosis not present

## 2014-08-21 DIAGNOSIS — I509 Heart failure, unspecified: Secondary | ICD-10-CM | POA: Diagnosis not present

## 2014-08-21 DIAGNOSIS — I50810 Right heart failure, unspecified: Secondary | ICD-10-CM

## 2014-08-21 DIAGNOSIS — I5042 Chronic combined systolic (congestive) and diastolic (congestive) heart failure: Secondary | ICD-10-CM | POA: Diagnosis present

## 2014-08-21 DIAGNOSIS — Z8249 Family history of ischemic heart disease and other diseases of the circulatory system: Secondary | ICD-10-CM | POA: Insufficient documentation

## 2014-08-21 DIAGNOSIS — I38 Endocarditis, valve unspecified: Secondary | ICD-10-CM | POA: Diagnosis not present

## 2014-08-21 DIAGNOSIS — I129 Hypertensive chronic kidney disease with stage 1 through stage 4 chronic kidney disease, or unspecified chronic kidney disease: Secondary | ICD-10-CM | POA: Insufficient documentation

## 2014-08-21 DIAGNOSIS — I252 Old myocardial infarction: Secondary | ICD-10-CM | POA: Diagnosis not present

## 2014-08-21 DIAGNOSIS — R188 Other ascites: Secondary | ICD-10-CM | POA: Insufficient documentation

## 2014-08-21 DIAGNOSIS — Z7901 Long term (current) use of anticoagulants: Secondary | ICD-10-CM | POA: Diagnosis not present

## 2014-08-21 DIAGNOSIS — I4821 Permanent atrial fibrillation: Secondary | ICD-10-CM

## 2014-08-21 DIAGNOSIS — N189 Chronic kidney disease, unspecified: Secondary | ICD-10-CM | POA: Diagnosis not present

## 2014-08-21 DIAGNOSIS — E785 Hyperlipidemia, unspecified: Secondary | ICD-10-CM | POA: Diagnosis not present

## 2014-08-21 DIAGNOSIS — Z951 Presence of aortocoronary bypass graft: Secondary | ICD-10-CM | POA: Diagnosis not present

## 2014-08-21 LAB — BASIC METABOLIC PANEL
ANION GAP: 9 (ref 5–15)
BUN: 55 mg/dL — ABNORMAL HIGH (ref 6–20)
CALCIUM: 9.8 mg/dL (ref 8.9–10.3)
CO2: 30 mmol/L (ref 22–32)
CREATININE: 2.01 mg/dL — AB (ref 0.61–1.24)
Chloride: 99 mmol/L — ABNORMAL LOW (ref 101–111)
GFR calc Af Amer: 34 mL/min — ABNORMAL LOW (ref >60)
GFR calc non Af Amer: 29 mL/min — ABNORMAL LOW (ref >60)
Glucose, Bld: 102 mg/dL — ABNORMAL HIGH (ref 65–99)
Potassium: 4.4 mmol/L (ref 3.5–5.1)
Sodium: 138 mmol/L (ref 135–145)

## 2014-08-21 LAB — BRAIN NATRIURETIC PEPTIDE: B NATRIURETIC PEPTIDE 5: 226.5 pg/mL — AB (ref 0.0–100.0)

## 2014-08-21 NOTE — Progress Notes (Signed)
Patient ID: Gary Lope Sr., male   DOB: 07-17-1929, 81 y.o.   MRN: [id number] PCP: Dr Etter Sjogren Primary Cardiologist: Dr. Stanford Breed  81 yo with history of CAD s/p CABG in 1995, chronic atrial fibrillation, CKD, and chronic diastolic CHF with prominent RV failure presents for CHF clinic evaluation.  Patient has struggled with right-sided failure. He was admitted in 5/16 with abdominal distention, volume overload, and elevated creatinine as high as 6.  Echo showed normal LV EF but RV was moderately dilated with moderate-severe TR, ?Ebsteins anomaly anatomy.  He had a paracentesis for ascites.  V/Q scan was negative for PE. He refused RHC while in the hospital.  He was diuresed and creatinine improved.  He reports today for 3 week follow up.  He is now back at home.  His breathing is better.  He can walk 100-200 yards without dyspnea.  He has a chair lift to get up the stairs at home but can climb a short flight of steps without problems.  He has been sleeping in a recliner for 3 years.  No PND.  His hobby is woodworking, which he is able to do without problems.  No chest pain.  +low back pain.  His heart rate is controlled without nodal blockers.   ECG: atrial fibrillation with LBBB  Labs (5/16): K 3.6, creatinine 6.15 => 2.47, LFTs normal, LDL 23, HCT 25.2 Labs (07/28/14): K 4.6, Creatinine 2.62, BNP 463 Labs (6/16): creatinine 2.23  PMH:  1. CAD: MI 1983, CABG 1995.   2. Chronic diastolic failure with prominent RV failure: Echo (5/16) with EF 55-60%, moderate LV, moderately dilated RV, massive biatrial enlargement, moderate-severe TR with some question of Ebstein's anomaly, PA systolic pressure 37 mmHg.  Patient has had ascites likely related to RV failure with h/o paracentesis.  3. CKD 4. Permanent atrial fibrillation 5. LBBB 6. Hyperlipidemia 7. HTN 8. Complex pancreatic cyst: Followed by GI.  9. Neurogenic bladder: Chronic foley.  10. Anemia of renal disease.  11. Cholecystectomy 12.  Appendectomy  SH: Married, nonsmoker, 4 kids, lives with son.   FH: CAD  ROS: All systems reviewed and negative except as per HPI.   Current Outpatient Prescriptions  Medication Sig Dispense Refill  . acetaminophen (TYLENOL) 500 MG tablet Take 1,000 mg by mouth 3 (three) times daily.    Marland Kitchen atorvastatin (LIPITOR) 20 MG tablet Take 1 tablet (20 mg total) by mouth daily. 30 tablet 0  . clonazePAM (KLONOPIN) 1 MG tablet TAKE 1 TABLET BY MOUTH TWICE DAILY AS NEEDED FOR ANXIETY 60 tablet 0  . ferrous sulfate 325 (65 FE) MG tablet Take 325 mg by mouth daily with breakfast.     . folic acid (FOLVITE) 1 MG tablet Take 1 mg by mouth daily.      . Glucos-Chondroit-Hyaluron-MSM (GLUCOSAMINE CHONDROITIN JOINT PO) Take 1 tablet by mouth 2 (two) times daily.    Marland Kitchen lidocaine (XYLOCAINE) 2 % jelly Place 1 application into the urethra daily as needed (pain).    . Meth-Hyo-M Bl-Na Phos-Ph Sal (URIBEL) 118 MG CAPS TAKE 1 TO 4 CAPSULE BY MOUTH EVERY DAY AS DIRECTED 120 capsule 5  . mirtazapine (REMERON) 15 MG tablet Take 1 tablet (15 mg total) by mouth at bedtime. 30 tablet 2  . Misc Natural Products (OSTEO BI-FLEX JOINT SHIELD PO) Take 650 mg by mouth 2 (two) times daily.    . Multiple Vitamin (MULTIVITAMIN) capsule Take 1 capsule by mouth daily.      Marland Kitchen neomycin-bacitracin-polymyxin (NEOSPORIN) ointment  Apply 1 application topically daily as needed for wound care. apply to eye    . ondansetron (ZOFRAN) 8 MG tablet Take 1 tablet (8 mg total) by mouth every 8 (eight) hours as needed for nausea or vomiting. 90 tablet 1  . torsemide (DEMADEX) 20 MG tablet Take 2 tablets (40 mg total) by mouth 2 (two) times daily. 60 tablet 11  . traMADol (ULTRAM) 50 MG tablet Take by mouth every 6 (six) hours as needed.    . traZODone (DESYREL) 50 MG tablet TAKE 1/2 TO 1 TABLET BY MOUTH EVERY NIGHT AT BEDTIME AS NEEDED FOR SLEEP 30 tablet 0  . warfarin (COUMADIN) 2.5 MG tablet Take 1 tablet by mouth daily or as directed by coumadin  clinic 30 tablet 0   No current facility-administered medications for this encounter.   There were no vitals taken for this visit. General: NAD Neck: JVP 7 cm, no thyromegaly or thyroid nodule.  Lungs: Clear to auscultation bilaterally with normal respiratory effort. CV: Nondisplaced PMI.  Heart irregular S1/S2, no S3/S4, 2/6 HSM LLSB.  No edema.  No carotid bruit.  Normal pedal pulses.  Abdomen: Soft, nontender, no hepatosplenomegaly, mild distention.  Skin: Intact without lesions or rashes.  Neurologic: Alert and oriented x 3.  Psych: Normal affect. Extremities: No clubbing or cyanosis.  HEENT: Normal.   Assessment/Plan: 1. Atrial fibrillation: Permanent.  Markedly dilated atria on echo, unlikely to stay in NSR if DCCV were to be attempted. He is on warfarin.  He has not been on any nodal blockers and HR is reasonably controlled.  2. Chronic diastolic CHF with prominent RV failure: On exam, he is not volume overloaded.  NYHA class II-III symptoms. Much improved compared to prior to hospitalization.  - Continue torsemide 40 mg bid.  3. Tricuspid regurgitation: Moderate to severe TR.  There was question of possible Ebstein's anomaly on echo. Regardless, will be managed medically. Need to keep decongested with torsemide.  4. Ascites: Suspect this has been due hepatic congestion from RV failure.  LFTs normal most recently.  5. CKD: Creatinine much improved, down to 2.23 most recently.  Suspect cardiorenal component.  He is seeing Dr Joelyn Oms for nephrology.  HD would be difficult with RV failure.  6. CAD: s/p CABG.  No chest pain.  Continue statin (recent LDL excellent).  No ASA with use of warfarin and stable CAD.   Followup in 2 months.   [name] 08/23/2014

## 2014-08-21 NOTE — Telephone Encounter (Signed)
Faxed successfully to [facility]. Sent for scanning. JG//CMA  

## 2014-08-21 NOTE — Patient Instructions (Signed)
Labs today, we will mail you a copy  Your physician recommends that you schedule a follow-up appointment in: 2 months

## 2014-08-27 ENCOUNTER — Other Ambulatory Visit: Payer: Self-pay | Admitting: Family Medicine

## 2014-08-27 ENCOUNTER — Ambulatory Visit (INDEPENDENT_AMBULATORY_CARE_PROVIDER_SITE_OTHER): Payer: Medicare Other | Admitting: Internal Medicine

## 2014-08-27 DIAGNOSIS — I4821 Permanent atrial fibrillation: Secondary | ICD-10-CM

## 2014-08-27 DIAGNOSIS — Z7901 Long term (current) use of anticoagulants: Secondary | ICD-10-CM

## 2014-08-27 DIAGNOSIS — I482 Chronic atrial fibrillation: Secondary | ICD-10-CM

## 2014-08-27 LAB — POCT INR: INR: 1.6

## 2014-08-27 NOTE — Telephone Encounter (Signed)
Gary [name] from [facility] states that patient is requesting 60 day supply of this medication. [phone]

## 2014-08-27 NOTE — Telephone Encounter (Signed)
Patient was seen and Rx filled 07/24/14 #30 with 2 refills He is requesting #60 instead of the 30  Please advise     KP

## 2014-09-03 ENCOUNTER — Emergency Department (HOSPITAL_BASED_OUTPATIENT_CLINIC_OR_DEPARTMENT_OTHER)
Admission: EM | Admit: 2014-09-03 | Discharge: 2014-09-03 | Disposition: A | Discharge status: Home / Self Care | Payer: Medicare Other | Attending: Emergency Medicine | Admitting: Emergency Medicine

## 2014-09-03 ENCOUNTER — Encounter (HOSPITAL_BASED_OUTPATIENT_CLINIC_OR_DEPARTMENT_OTHER): Payer: Self-pay

## 2014-09-03 ENCOUNTER — Ambulatory Visit (INDEPENDENT_AMBULATORY_CARE_PROVIDER_SITE_OTHER): Payer: Medicare Other | Admitting: Cardiovascular Disease

## 2014-09-03 DIAGNOSIS — D649 Anemia, unspecified: Secondary | ICD-10-CM | POA: Diagnosis not present

## 2014-09-03 DIAGNOSIS — N189 Chronic kidney disease, unspecified: Secondary | ICD-10-CM | POA: Diagnosis not present

## 2014-09-03 DIAGNOSIS — Z79899 Other long term (current) drug therapy: Secondary | ICD-10-CM | POA: Diagnosis not present

## 2014-09-03 DIAGNOSIS — Z8719 Personal history of other diseases of the digestive system: Secondary | ICD-10-CM | POA: Insufficient documentation

## 2014-09-03 DIAGNOSIS — Z87891 Personal history of nicotine dependence: Secondary | ICD-10-CM | POA: Diagnosis not present

## 2014-09-03 DIAGNOSIS — R3919 Other difficulties with micturition: Secondary | ICD-10-CM | POA: Diagnosis not present

## 2014-09-03 DIAGNOSIS — Z7901 Long term (current) use of anticoagulants: Secondary | ICD-10-CM | POA: Diagnosis not present

## 2014-09-03 DIAGNOSIS — F419 Anxiety disorder, unspecified: Secondary | ICD-10-CM | POA: Diagnosis not present

## 2014-09-03 DIAGNOSIS — I2510 Atherosclerotic heart disease of native coronary artery without angina pectoris: Secondary | ICD-10-CM | POA: Insufficient documentation

## 2014-09-03 DIAGNOSIS — Z8674 Personal history of sudden cardiac arrest: Secondary | ICD-10-CM | POA: Diagnosis not present

## 2014-09-03 DIAGNOSIS — I5022 Chronic systolic (congestive) heart failure: Secondary | ICD-10-CM | POA: Diagnosis not present

## 2014-09-03 DIAGNOSIS — R34 Anuria and oliguria: Secondary | ICD-10-CM | POA: Insufficient documentation

## 2014-09-03 DIAGNOSIS — I499 Cardiac arrhythmia, unspecified: Secondary | ICD-10-CM | POA: Insufficient documentation

## 2014-09-03 DIAGNOSIS — E785 Hyperlipidemia, unspecified: Secondary | ICD-10-CM | POA: Insufficient documentation

## 2014-09-03 DIAGNOSIS — Z87448 Personal history of other diseases of urinary system: Secondary | ICD-10-CM | POA: Insufficient documentation

## 2014-09-03 DIAGNOSIS — Z880 Allergy status to penicillin: Secondary | ICD-10-CM | POA: Diagnosis not present

## 2014-09-03 DIAGNOSIS — I129 Hypertensive chronic kidney disease with stage 1 through stage 4 chronic kidney disease, or unspecified chronic kidney disease: Secondary | ICD-10-CM | POA: Insufficient documentation

## 2014-09-03 DIAGNOSIS — R339 Retention of urine, unspecified: Secondary | ICD-10-CM | POA: Insufficient documentation

## 2014-09-03 DIAGNOSIS — I4821 Permanent atrial fibrillation: Secondary | ICD-10-CM

## 2014-09-03 DIAGNOSIS — I482 Chronic atrial fibrillation: Secondary | ICD-10-CM

## 2014-09-03 LAB — URINALYSIS, ROUTINE W REFLEX MICROSCOPIC
Bilirubin Urine: NEGATIVE
Glucose, UA: NEGATIVE mg/dL
Ketones, ur: NEGATIVE mg/dL
Nitrite: NEGATIVE
PH: 6 (ref 5.0–8.0)
PROTEIN: 100 mg/dL — AB
Specific Gravity, Urine: 1.009 (ref 1.005–1.030)
Urobilinogen, UA: 0.2 mg/dL (ref 0.0–1.0)

## 2014-09-03 LAB — URINE MICROSCOPIC-ADD ON

## 2014-09-03 LAB — POCT INR: INR: 1.2

## 2014-09-03 NOTE — Discharge Instructions (Signed)
Call your doctor tomorrow to have them follow-up on the urine culture that was obtained today. It will probably result in the next 2-3 days. If you develop any symptoms of UTI, including pain, vomiting, or fever, see her primary doctor or come back to the ER.    Acute Urinary Retention Acute urinary retention is the temporary inability to urinate. This is a common problem in older men. As men age their prostates become larger and block the flow of urine from the bladder. This is usually a problem that has come on gradually.  HOME CARE INSTRUCTIONS If you are sent home with a Foley catheter and a drainage system, you will need to discuss the best course of action with your health care provider. While the catheter is in, maintain a good intake of fluids. Keep the drainage bag emptied and lower than your catheter. This is so that contaminated urine will not flow back into your bladder, which could lead to a urinary tract infection. There are two main types of drainage bags. One is a large bag that usually is used at night. It has a good capacity that will allow you to sleep through the night without having to empty it. The second type is called a leg bag. It has a smaller capacity, so it needs to be emptied more frequently. However, the main advantage is that it can be attached by a leg strap and can go underneath your clothing, allowing you the freedom to move about or leave your home. Only take over-the-counter or prescription medicines for pain, discomfort, or fever as directed by your health care provider.  SEEK MEDICAL CARE IF:  You develop a low-grade fever.  You experience spasms or leakage of urine with the spasms. SEEK IMMEDIATE MEDICAL CARE IF:   You develop chills or fever.  Your catheter stops draining urine.  Your catheter falls out.  You start to develop increased bleeding that does not respond to rest and increased fluid intake. MAKE SURE YOU:  Understand these  instructions.  Will watch your condition.  Will get help right away if you are not doing well or get worse. Document Released: 05/22/2000 Document Revised: 02/18/2013 Document Reviewed: 07/25/2012 [facility] Patient Information 2015 [place], [place]. This information is not intended to replace advice given to you by your health care provider. Make sure you discuss any questions you have with your health care provider.

## 2014-09-03 NOTE — ED Notes (Signed)
Report received from Seth Bake, [place] care assumed.

## 2014-09-03 NOTE — ED Notes (Signed)
Foley cath irrigated with no return , balloon deflated and catheter advanced w/o difficulty and immediate return of bloody urine , MD ad bedside balloon inflated output 450 cc

## 2014-09-03 NOTE — ED Notes (Signed)
MD at bedside. 

## 2014-09-03 NOTE — ED Provider Notes (Signed)
CSN: [id number]     Arrival date & time 09/03/14  1727 History   First MD Initiated Contact with Patient 09/03/14 1737     Chief Complaint  Patient presents with  . Urinary Retention     (Consider location/radiation/quality/duration/timing/severity/associated sxs/prior Treatment) HPI  81 year old male presents with urinary retention over the last 2 hours. His home health nurse tried 2 different fully catheters without being able to get any urine out. He has a history of nerve genic bladder and has a chronic Foley the gets changed every 12 weeks. The patient has not been feeling ill prior to this starting. He is having lower abdominal pain and swelling. He has a history of chronic heart failure and chronic kidney disease. The patient is also on Coumadin, last INR was checked this morning by home health nurse and was 1.2. He is already on instruction to increase his dose tonight. He has noticed some bleeding with the inability to get his urine out. While I was in the room prior to examining the patient are nursing staff further inserted his Foley to the point that urine was obtained and over 500 mL of urine was taken out of his bladder. Patient now feels complete resolution of his symptoms.  Past Medical History  Diagnosis Date  . Neurogenic bladder     has had bladder catheter changes at the urology office every 12 weeks  . Anemia   . Hyperlipidemia   . Hypertension   . Heart attack     1983  . Chronic systolic heart failure     a. prior EF 20-30%;  b. Echo 7/09: EF 35-40%, basal inferoposterior HK, mild AI, mild to moderate MR, moderate to marked LAE, mild to moderate TR, mildly increased PASP, moderate RAE;   c. Echo 01/2012:   mild LVH, EF 50%, Tr AI, mild MR, severe LAE, mod to severe RAE, mod to severe RVE, mod reduced RVSF, mod to severe TR, PASP 46  . Arrhythmia     afib  . Warfarin anticoagulation   . GERD (gastroesophageal reflux disease)   . Anxiety   . Renal failure   . CHF  (congestive heart failure)   . Coronary artery disease    Past Surgical History  Procedure Laterality Date  . Appendectomy    . Cholecystectomy      1971  . Coronary artery bypass graft      1995   Family History  Problem Relation Age of Onset  . Cancer Mother   . Heart attack Father     deceased at 63, smoking, ETOH abuse  . Breast cancer Sister    History  Substance Use Topics  . Smoking status: Former Research scientist (life sciences)  . Smokeless tobacco: Never Used  . Alcohol Use: No    Review of Systems  Constitutional: Negative for fever.  Gastrointestinal: Positive for abdominal pain. Negative for vomiting.  Genitourinary: Positive for decreased urine volume and difficulty urinating.  All other systems reviewed and are negative.     Allergies  Amoxicillin; Diltiazem hcl; Hydrocodone-acetaminophen; Niacin; Niacin-lovastatin er; Paroxetine; Propoxyphene n-acetaminophen; Pseudoephedrine; Sulfonamide derivatives; and Tolterodine tartrate  Home Medications   Prior to Admission medications   Medication Sig Start Date End Date Taking? Authorizing Provider  acetaminophen (TYLENOL) 500 MG tablet Take 1,000 mg by mouth 3 (three) times daily.    Historical Provider, MD  atorvastatin (LIPITOR) 20 MG tablet Take 1 tablet (20 mg total) by mouth daily. 07/24/14   Rosalita Chessman, DO  clonazePAM (  KLONOPIN) 1 MG tablet TAKE 1 TABLET BY MOUTH TWICE DAILY AS NEEDED FOR ANXIETY 06/08/14   Rosalita Chessman, DO  ferrous sulfate 325 (65 FE) MG tablet Take 325 mg by mouth daily with breakfast.     Historical Provider, MD  folic acid (FOLVITE) 1 MG tablet Take 1 mg by mouth daily.      Historical Provider, MD  Glucos-Chondroit-Hyaluron-MSM (GLUCOSAMINE CHONDROITIN JOINT PO) Take 1 tablet by mouth 2 (two) times daily.    Historical Provider, MD  lidocaine (XYLOCAINE) 2 % jelly Place 1 application into the urethra daily as needed (pain).    Historical Provider, MD  Meth-Hyo-M Bl-Na Phos-Ph Sal (URIBEL) 118 MG CAPS TAKE  1 TO 4 CAPSULE BY MOUTH EVERY DAY AS DIRECTED 06/08/14   Rosalita Chessman, DO  mirtazapine (REMERON) 15 MG tablet TAKE 1 TABLET(15 MG) BY MOUTH AT BEDTIME 08/27/14   Rosalita Chessman, DO  Misc Natural Products (OSTEO BI-FLEX JOINT SHIELD PO) Take 650 mg by mouth 2 (two) times daily.    Historical Provider, MD  Multiple Vitamin (MULTIVITAMIN) capsule Take 1 capsule by mouth daily.      Historical Provider, MD  neomycin-bacitracin-polymyxin (NEOSPORIN) ointment Apply 1 application topically daily as needed for wound care. apply to eye    Historical Provider, MD  ondansetron (ZOFRAN) 8 MG tablet Take 1 tablet (8 mg total) by mouth every 8 (eight) hours as needed for nausea or vomiting. 07/24/14   Rosalita Chessman, DO  torsemide (DEMADEX) 20 MG tablet Take 2 tablets (40 mg total) by mouth 2 (two) times daily. 07/18/14   Erlene Quan, PA-C  traMADol (ULTRAM) 50 MG tablet Take by mouth every 6 (six) hours as needed.    Historical Provider, MD  traZODone (DESYREL) 50 MG tablet TAKE 1/2 TO 1 TABLET BY MOUTH EVERY NIGHT AT BEDTIME AS NEEDED FOR SLEEP 04/20/14   Rosalita Chessman, DO  warfarin (COUMADIN) 2.5 MG tablet Take 1 tablet by mouth daily or as directed by coumadin clinic 07/24/14   Alferd Apa Lowne, DO   BP 165/91 mmHg  Pulse 109  Resp 22  Wt 173 lb 8 oz (78.699 kg)  SpO2 100% Physical Exam  Constitutional: He is oriented to person, place, and time. He appears well-developed and well-nourished.  HENT:  Head: Normocephalic and atraumatic.  Right Ear: External ear normal.  Left Ear: External ear normal.  Nose: Nose normal.  Eyes: Right eye exhibits no discharge. Left eye exhibits no discharge.  Neck: Neck supple.  Cardiovascular: Normal rate, regular rhythm, normal heart sounds and intact distal pulses.   Pulmonary/Chest: Effort normal.  Abdominal: Soft. He exhibits no distension. There is no tenderness.  Genitourinary:  Mild dried blood around penis. No obvious trauma  Musculoskeletal: He exhibits no  edema.  Neurological: He is alert and oriented to person, place, and time.  Skin: Skin is warm and dry.  Nursing note and vitals reviewed.   ED Course  Procedures (including critical care time) Labs Review Labs Reviewed  URINALYSIS, ROUTINE W REFLEX MICROSCOPIC (NOT AT [facility]) - Abnormal; Notable for the following:    Color, Urine BROWN (*)    APPearance CLOUDY (*)    Hgb urine dipstick LARGE (*)    Protein, ur 100 (*)    Leukocytes, UA MODERATE (*)    All other components within normal limits  URINE MICROSCOPIC-ADD ON - Abnormal; Notable for the following:    Bacteria, UA MANY (*)    All other components  within normal limits  URINE CULTURE    Imaging Review No results found.   EKG Interpretation None      MDM   Final diagnoses:  Urinary retention    Patient now feels completely normal after his urine was adequately drained with the Foley. There was some blood initially that was likely contributing to some retention within his urine started to become more clear. Given he only had retention for 2 hours I doubt significant kidney injury and has already had his INR checked and thus I do not feel blood work would be helpful. Will check his urine to rule out infection given this is a new Foley. Urine shows possible UTI, with chronic foley is difficult to interpret. After discussion with family and patient, he prefers to wait on culture results as he has done in past and not start antibiotics now given multiple allergies and side effects. I feel this is reasonable given he has no systemic signs and had retention for 2 hours.     Sherwood Gambler, MD 09/04/14 [phone]

## 2014-09-03 NOTE — ED Notes (Signed)
Pt reports has had urinary retention x 2 hrs with blocked catheter.  Has been changed twice by staff but still without output, pt appears in pain.

## 2014-09-04 ENCOUNTER — Telehealth: Payer: Self-pay | Admitting: Family Medicine

## 2014-09-04 ENCOUNTER — Other Ambulatory Visit: Payer: Self-pay | Admitting: Family Medicine

## 2014-09-04 NOTE — Telephone Encounter (Signed)
Coumadin clinic needs to refill the coumadin i will review culture

## 2014-09-04 NOTE — Telephone Encounter (Signed)
Caller name: Dayren Thien Relationship to patient: self Can be reached: [phone] Pharmacy: [facility] at [facility]  Reason for call:  1. Pt said he had cath changed yesterday at [facility]. They want Dr. Etter Sjogren to review the urine culture that was checking for UTI and determine if she wants antibiotics ordered. 2. Pt said that the coumadin clinic has him going between warfarin 2.5mg  and 5.0mg . He has a bottle of the 5.0mg  dated 09/08/13. He is wondering if it is safe to use. If not he said let him know and he can throw it away but will need a new RX sent to the pharmacy. He is also needing a refill on the 2.5mg  sent in.  Pt does not drive and has to rely on others for transportation. He is going to be at the pharmacy today at 2:00pm if we can please call meds in before that time.

## 2014-09-04 NOTE — Telephone Encounter (Signed)
Spoke with patient and I advised of the below recommendations. He verbalized understanding. He has agreed to call the coumadin clinic for medication follow up.   KP

## 2014-09-04 NOTE — Telephone Encounter (Signed)
Please advise      KP 

## 2014-09-06 ENCOUNTER — Other Ambulatory Visit: Payer: Self-pay | Admitting: Family Medicine

## 2014-09-08 ENCOUNTER — Ambulatory Visit: Payer: Medicare Other | Admitting: Dietician

## 2014-09-08 LAB — URINE CULTURE

## 2014-09-09 ENCOUNTER — Telehealth: Payer: Self-pay | Admitting: Emergency Medicine

## 2014-09-09 NOTE — Telephone Encounter (Signed)
Post ED Visit - Positive Culture Follow-up: Successful Patient Follow-Up  Culture assessed and recommendations reviewed by: [x]  Heide Guile, Pharm.D., BCPS-AQ ID []  Alycia Rossetti, Pharm.D., BCPS []  [place], Pharm.D., BCPS, AAHIVP []  Legrand Como, Pharm.D., BCPS, AAHIVP []  Tegan Magsam, Pharm.D. []  Milus Glazier, [place].D.  Positive Urine culture   Changes discussed with ED provider: Lianne Bushy, PA "call patient, if no urinary symptoms, no treatment  Contacted patient, date 09/09/14, time 1515 Denies urinary symptoms  Ernesta Amble 09/09/2014, 3:16 PM

## 2014-09-10 ENCOUNTER — Ambulatory Visit (INDEPENDENT_AMBULATORY_CARE_PROVIDER_SITE_OTHER): Payer: Medicare Other | Admitting: Pharmacist

## 2014-09-10 ENCOUNTER — Telehealth: Payer: Self-pay | Admitting: Family Medicine

## 2014-09-10 DIAGNOSIS — I482 Chronic atrial fibrillation: Secondary | ICD-10-CM

## 2014-09-10 DIAGNOSIS — I4821 Permanent atrial fibrillation: Secondary | ICD-10-CM

## 2014-09-10 DIAGNOSIS — Z7901 Long term (current) use of anticoagulants: Secondary | ICD-10-CM

## 2014-09-10 LAB — POCT INR: INR: 1.9

## 2014-09-10 MED ORDER — NITROFURANTOIN MONOHYD MACRO 100 MG PO CAPS: 100.0000 mg | ORAL_CAPSULE | Freq: Two times a day (BID) | ORAL | Status: DC

## 2014-09-10 NOTE — Telephone Encounter (Signed)
Please review the UC and advise, No Abx on medication list.    KP

## 2014-09-10 NOTE — Telephone Encounter (Signed)
Relation to pt: self  Call back number:[phone] Pharmacy:  [facility] [id number] - [place], [place] AT [address] [phone] (Phone) [phone] (Fax)         Reason for call:  Patient was seen in the ED 09/03/14 due to catheter change, patient had urinalysis done and was advised to call PCP regarding results just in case an antibiotics needed to be prescribe. (patient stated he did not need a follow up appointment, not experiencing any symtopms) Please advise

## 2014-09-10 NOTE — Telephone Encounter (Signed)
Rx faxed and the patient has been made aware.     KP

## 2014-09-10 NOTE — Telephone Encounter (Signed)
macrobid 1 po bid x 7 days  

## 2014-09-11 NOTE — Telephone Encounter (Signed)
Spoke with the patient and he wanted to have his INR checked due to him being on an ABX, when would you like to have this done?    KP

## 2014-09-11 NOTE — Telephone Encounter (Signed)
Check it Monday

## 2014-09-11 NOTE — Telephone Encounter (Signed)
Patient is wanting to know if this antibiotic will interfere with the warfarin that he is on?

## 2014-09-14 NOTE — Telephone Encounter (Signed)
I called Gary [name] at [facility] and she will have someone to go out to the home.     KP

## 2014-09-15 ENCOUNTER — Telehealth: Payer: Self-pay | Admitting: *Deleted

## 2014-09-15 ENCOUNTER — Other Ambulatory Visit: Payer: Self-pay | Admitting: Family Medicine

## 2014-09-15 ENCOUNTER — Telehealth: Payer: Self-pay | Admitting: Family Medicine

## 2014-09-15 ENCOUNTER — Ambulatory Visit (INDEPENDENT_AMBULATORY_CARE_PROVIDER_SITE_OTHER): Payer: Medicare Other | Admitting: Pharmacist Clinician (PhC)/ Clinical Pharmacy Specialist

## 2014-09-15 DIAGNOSIS — I482 Chronic atrial fibrillation: Secondary | ICD-10-CM

## 2014-09-15 DIAGNOSIS — Z7901 Long term (current) use of anticoagulants: Secondary | ICD-10-CM

## 2014-09-15 DIAGNOSIS — I4821 Permanent atrial fibrillation: Secondary | ICD-10-CM

## 2014-09-15 LAB — POCT INR: INR: 2.2

## 2014-09-15 NOTE — Telephone Encounter (Signed)
Caller name:Kari  Relation to pt: RN from [employer]  Call back number:[phone]    Reason for call:  INR  2.2, results called into coumadin clinic /  keeping  dosages the same

## 2014-09-15 NOTE — Telephone Encounter (Signed)
Last seen 07/24/14 and filled 05/01/14 #30   Please advise     KP

## 2014-09-15 NOTE — Telephone Encounter (Signed)
FYI for MD.      Gary [name]

## 2014-09-15 NOTE — Telephone Encounter (Signed)
08-27-14 and 09-03-14 INR orders faxed

## 2014-09-17 ENCOUNTER — Telehealth: Payer: Self-pay | Admitting: *Deleted

## 2014-09-17 NOTE — Telephone Encounter (Signed)
09-10-14 protime orders to recheck in one week and directions for warfarin dosage faxed.

## 2014-09-24 ENCOUNTER — Ambulatory Visit (INDEPENDENT_AMBULATORY_CARE_PROVIDER_SITE_OTHER): Payer: Medicare Other | Admitting: Cardiovascular Disease

## 2014-09-24 DIAGNOSIS — I482 Chronic atrial fibrillation: Secondary | ICD-10-CM

## 2014-09-24 DIAGNOSIS — Z7901 Long term (current) use of anticoagulants: Secondary | ICD-10-CM

## 2014-09-24 DIAGNOSIS — I4821 Permanent atrial fibrillation: Secondary | ICD-10-CM

## 2014-09-24 LAB — POCT INR: INR: 2.3

## 2014-09-27 ENCOUNTER — Emergency Department (HOSPITAL_BASED_OUTPATIENT_CLINIC_OR_DEPARTMENT_OTHER)
Admission: EM | Admit: 2014-09-27 | Discharge: 2014-09-27 | Disposition: A | Discharge status: Home / Self Care | Payer: Medicare Other | Attending: Emergency Medicine | Admitting: Emergency Medicine

## 2014-09-27 ENCOUNTER — Encounter (HOSPITAL_BASED_OUTPATIENT_CLINIC_OR_DEPARTMENT_OTHER): Payer: Self-pay | Admitting: *Deleted

## 2014-09-27 DIAGNOSIS — R21 Rash and other nonspecific skin eruption: Secondary | ICD-10-CM | POA: Insufficient documentation

## 2014-09-27 DIAGNOSIS — Y846 Urinary catheterization as the cause of abnormal reaction of the patient, or of later complication, without mention of misadventure at the time of the procedure: Secondary | ICD-10-CM | POA: Diagnosis not present

## 2014-09-27 DIAGNOSIS — I4891 Unspecified atrial fibrillation: Secondary | ICD-10-CM | POA: Diagnosis not present

## 2014-09-27 DIAGNOSIS — R3919 Other difficulties with micturition: Secondary | ICD-10-CM | POA: Diagnosis not present

## 2014-09-27 DIAGNOSIS — T839XXA Unspecified complication of genitourinary prosthetic device, implant and graft, initial encounter: Secondary | ICD-10-CM

## 2014-09-27 DIAGNOSIS — T83028A Displacement of other indwelling urethral catheter, initial encounter: Secondary | ICD-10-CM | POA: Diagnosis not present

## 2014-09-27 DIAGNOSIS — I5022 Chronic systolic (congestive) heart failure: Secondary | ICD-10-CM | POA: Diagnosis not present

## 2014-09-27 DIAGNOSIS — Z7901 Long term (current) use of anticoagulants: Secondary | ICD-10-CM | POA: Insufficient documentation

## 2014-09-27 DIAGNOSIS — I10 Essential (primary) hypertension: Secondary | ICD-10-CM | POA: Insufficient documentation

## 2014-09-27 DIAGNOSIS — Z8719 Personal history of other diseases of the digestive system: Secondary | ICD-10-CM | POA: Diagnosis not present

## 2014-09-27 DIAGNOSIS — Z8674 Personal history of sudden cardiac arrest: Secondary | ICD-10-CM | POA: Insufficient documentation

## 2014-09-27 DIAGNOSIS — Z880 Allergy status to penicillin: Secondary | ICD-10-CM | POA: Insufficient documentation

## 2014-09-27 DIAGNOSIS — I2510 Atherosclerotic heart disease of native coronary artery without angina pectoris: Secondary | ICD-10-CM | POA: Insufficient documentation

## 2014-09-27 DIAGNOSIS — E785 Hyperlipidemia, unspecified: Secondary | ICD-10-CM | POA: Insufficient documentation

## 2014-09-27 DIAGNOSIS — R339 Retention of urine, unspecified: Secondary | ICD-10-CM | POA: Diagnosis not present

## 2014-09-27 DIAGNOSIS — D649 Anemia, unspecified: Secondary | ICD-10-CM | POA: Insufficient documentation

## 2014-09-27 DIAGNOSIS — F419 Anxiety disorder, unspecified: Secondary | ICD-10-CM | POA: Diagnosis not present

## 2014-09-27 DIAGNOSIS — Z79899 Other long term (current) drug therapy: Secondary | ICD-10-CM | POA: Insufficient documentation

## 2014-09-27 DIAGNOSIS — Z87448 Personal history of other diseases of urinary system: Secondary | ICD-10-CM | POA: Insufficient documentation

## 2014-09-27 DIAGNOSIS — Z87891 Personal history of nicotine dependence: Secondary | ICD-10-CM | POA: Insufficient documentation

## 2014-09-27 NOTE — ED Notes (Signed)
Pt reports that his foley catheter came out some, unable to get it back it.  States that he has been unable to void in 2 hours.

## 2014-09-27 NOTE — ED Notes (Addendum)
Patient came to [facility] due to malposition of chronic urinary catheter. Patient reports he has being using urinary foleys for over 13 years. Bladder scan reveled 580 ml in bladder, areas distended and painful upon palpitations. Verbal order received from EDP to replace the urinary catheter. Patient instructed on plan of care. Old urinary catheter removed. Deflating the internal balloon found only 5 ml of clear solution present. Removed catheter without difficulty. Bright red blood present once catheter removed. Area cleaned per protocol. New 18 fr urinary foley placed without difficulty. Blood clot present upon the insertion with blue green colored urine. Per pt reports that the urine is that color due to medication he is presently on. Patient reports relief . Patient's son informed of the water amount in the previous urinary foley and blood present upon removal of the old foley.

## 2014-09-27 NOTE — Discharge Instructions (Signed)
If you were given medicines take as directed.  If you are on coumadin or contraceptives realize their levels and effectiveness is altered by many different medicines.  If you have any reaction (rash, tongues swelling, other) to the medicines stop taking and see a physician.    If your blood pressure was elevated in the ER make sure you follow up for management with a primary doctor or return for chest pain, shortness of breath or stroke symptoms.  Please follow up as directed and return to the ER or see a physician for new or worsening symptoms.  Thank you. Filed Vitals:   09/27/14 1817 09/27/14 2037  BP: 138/69 106/60  Pulse: 85 72  Temp: 98.2 F (36.8 C)   TempSrc: Oral   Resp: 18 19  Height: 6' (1.829 m)   Weight: 170 lb (77.111 kg)   SpO2: 100% 96%

## 2014-09-27 NOTE — ED Notes (Signed)
Offered patient crackers, something to drink, etc. Patient is very eager to go home. Explained the delay.

## 2014-09-27 NOTE — ED Notes (Signed)
Patient states he is not supposed to retain urine for more than two hours per his PCP due to his congestive heart failure. I helped place a diaper on patient as he felt he was about to pass stool. Patient anxious to get new foley placed, he states pressure is unbearable.

## 2014-09-27 NOTE — ED Notes (Signed)
I used a bladder scanner and got result of 533 ml's urine shown. I placed a foley cath size #18, then got small amount of blood, then clear "green" urine totalling 500 mls. Patient comfort improved greatly.

## 2014-09-27 NOTE — ED Provider Notes (Signed)
CSN: [id number]     Arrival date & time 09/27/14  1808 History  This chart was scribed for Gary Morrison, MD by Helane Gunther, ED Scribe. This patient was seen in room MH07/MH07 and the patient's care was started at 7:37 PM.    Chief Complaint  Patient presents with  . Urinary Retention   The history is provided by the patient. No language interpreter was used.   HPI Comments: Gary [name]. is a 81 y.o. male who presents to the Emergency Department complaining of urinary retention onset 3 hours ago. He states that his foley catheter got caught on something and pulled out a little 3 hours ago and since then he has been feeling pressure on the lower abdomen. He has been to the ED for the same 3 weeks ago, when he also got a catheter placed. He has a PMHx of an enlarged prostate. Pt reports relief from the pressure since the new catheter has been placed at the ED.  Past Medical History  Diagnosis Date  . Neurogenic bladder     has had bladder catheter changes at the urology office every 12 weeks  . Anemia   . Hyperlipidemia   . Hypertension   . Heart attack     1983  . Chronic systolic heart failure     a. prior EF 20-30%;  b. Echo 7/09: EF 35-40%, basal inferoposterior HK, mild AI, mild to moderate MR, moderate to marked LAE, mild to moderate TR, mildly increased PASP, moderate RAE;   c. Echo 01/2012:   mild LVH, EF 50%, Tr AI, mild MR, severe LAE, mod to severe RAE, mod to severe RVE, mod reduced RVSF, mod to severe TR, PASP 46  . Arrhythmia     afib  . Warfarin anticoagulation   . GERD (gastroesophageal reflux disease)   . Anxiety   . Renal failure   . CHF (congestive heart failure)   . Coronary artery disease    Past Surgical History  Procedure Laterality Date  . Appendectomy    . Cholecystectomy      1971  . Coronary artery bypass graft      1995   Family History  Problem Relation Age of Onset  . Cancer Mother   . Heart attack Father     deceased at 80, smoking,  ETOH abuse  . Breast cancer Sister    History  Substance Use Topics  . Smoking status: Former Research scientist (life sciences)  . Smokeless tobacco: Never Used  . Alcohol Use: No    Review of Systems  Genitourinary: Positive for difficulty urinating.  All other systems reviewed and are negative.   Allergies  Amoxicillin; Diltiazem hcl; Hydrocodone-acetaminophen; Niacin; Niacin-lovastatin er; Paroxetine; Propoxyphene n-acetaminophen; Pseudoephedrine; Sulfonamide derivatives; and Tolterodine tartrate  Home Medications   Prior to Admission medications   Medication Sig Start Date End Date Taking? Authorizing Provider  acetaminophen (TYLENOL) 500 MG tablet Take 1,000 mg by mouth 3 (three) times daily.    Historical Provider, MD  atorvastatin (LIPITOR) 20 MG tablet TAKE 1 TABLET BY MOUTH EVERY DAY 09/07/14   Alferd Apa Lowne, DO  clonazePAM (KLONOPIN) 1 MG tablet TAKE 1 TABLET BY MOUTH TWICE DAILY AS NEEDED FOR ANXIETY 06/08/14   Rosalita Chessman, DO  ferrous sulfate 325 (65 FE) MG tablet Take 325 mg by mouth daily with breakfast.     Historical Provider, MD  folic acid (FOLVITE) 1 MG tablet Take 1 mg by mouth daily.      Historical  Provider, MD  Glucos-Chondroit-Hyaluron-MSM (GLUCOSAMINE CHONDROITIN JOINT PO) Take 1 tablet by mouth 2 (two) times daily.    Historical Provider, MD  lidocaine (XYLOCAINE) 2 % jelly Place 1 application into the urethra daily as needed (pain).    Historical Provider, MD  Meth-Hyo-M Bl-Na Phos-Ph Sal (URIBEL) 118 MG CAPS TAKE 1 TO 4 CAPSULE BY MOUTH EVERY DAY AS DIRECTED 06/08/14   Rosalita Chessman, DO  mirtazapine (REMERON) 15 MG tablet TAKE 1 TABLET(15 MG) BY MOUTH AT BEDTIME 08/27/14   Rosalita Chessman, DO  Misc Natural Products (OSTEO BI-FLEX JOINT SHIELD PO) Take 650 mg by mouth 2 (two) times daily.    Historical Provider, MD  Multiple Vitamin (MULTIVITAMIN) capsule Take 1 capsule by mouth daily.      Historical Provider, MD  neomycin-bacitracin-polymyxin (NEOSPORIN) ointment Apply 1  application topically daily as needed for wound care. apply to eye    Historical Provider, MD  nitrofurantoin, macrocrystal-monohydrate, (MACROBID) 100 MG capsule Take 1 capsule (100 mg total) by mouth 2 (two) times daily. 09/10/14   Rosalita Chessman, DO  ondansetron (ZOFRAN) 8 MG tablet Take 1 tablet (8 mg total) by mouth every 8 (eight) hours as needed for nausea or vomiting. 07/24/14   Rosalita Chessman, DO  torsemide (DEMADEX) 20 MG tablet Take 2 tablets (40 mg total) by mouth 2 (two) times daily. 07/18/14   Erlene Quan, PA-C  traMADol (ULTRAM) 50 MG tablet TAKE 1 TABLET BY MOUTH EVERY 8 HOURS AS NEEDED 09/15/14   Rosalita Chessman, DO  traZODone (DESYREL) 50 MG tablet TAKE 1/2 TO 1 TABLET BY MOUTH EVERY NIGHT AT BEDTIME AS NEEDED FOR SLEEP 04/20/14   Rosalita Chessman, DO  warfarin (COUMADIN) 2.5 MG tablet TAKE 1 TABLET BY MOUTH DAILY AS DIRECTED BY COUMADIN CLINIC 09/04/14   Alferd Apa Lowne, DO   BP 106/60 mmHg  Pulse 72  Temp(Src) 98.2 F (36.8 C) (Oral)  Resp 19  Ht 6' (1.829 m)  Wt 170 lb (77.111 kg)  BMI 23.05 kg/m2  SpO2 96% Physical Exam  Constitutional: He is oriented to person, place, and time. He appears well-developed and well-nourished. No distress.  HENT:  Head: Normocephalic and atraumatic.  Mouth/Throat: Oropharynx is clear and moist.  Eyes: Conjunctivae and EOM are normal. Pupils are equal, round, and reactive to light.  Neck: Normal range of motion. Neck supple. No tracheal deviation present.  Cardiovascular: Normal rate.   Pulmonary/Chest: Breath sounds normal. No respiratory distress.  Abdominal: Soft.  Genitourinary:  Rash in the groin area, mild satellite lesions/macular.  Musculoskeletal: Normal range of motion.  Neurological: He is alert and oriented to person, place, and time.  Skin: Skin is warm and dry.  Psychiatric: He has a normal mood and affect. His behavior is normal.  Nursing note and vitals reviewed.   ED Course  Procedures  DIAGNOSTIC STUDIES: Oxygen  Saturation is 100% on RA, normal by my interpretation.    COORDINATION OF CARE: 7:41 PM - Discussed plans to observe progress with catheter. Pt advised of plan for treatment and pt agrees.  Labs Review Labs Reviewed - No data to display  Imaging Review No results found.   EKG Interpretation None      MDM   Final diagnoses:  Urinary retention  Foley catheter problem, initial encounter   Patient presented after urinary retention secondary-slightly pulling urinary catheter partially out. Patient did not have appropriate amount of milliliters in the bulb. New Foley placed, symptoms resolved, vitals normal,  no fever. Plan for outpatient follow-up.  Results and differential diagnosis were discussed with the patient/parent/guardian. Xrays were independently reviewed by myself.  Close follow up outpatient was discussed, comfortable with the plan.   Medications - No data to display  Filed Vitals:   09/27/14 1817 09/27/14 2037  BP: 138/69 106/60  Pulse: 85 72  Temp: 98.2 F (36.8 C)   TempSrc: Oral   Resp: 18 19  Height: 6' (1.829 m)   Weight: 170 lb (77.111 kg)   SpO2: 100% 96%    Final diagnoses:  Urinary retention  Foley catheter problem, initial encounter      Gary Morrison, MD 09/27/14 2055

## 2014-09-28 ENCOUNTER — Other Ambulatory Visit: Payer: Self-pay | Admitting: Family Medicine

## 2014-10-02 ENCOUNTER — Other Ambulatory Visit: Payer: Self-pay | Admitting: Family Medicine

## 2014-10-05 ENCOUNTER — Telehealth: Payer: Self-pay

## 2014-10-05 MED ORDER — NITROFURANTOIN MONOHYD MACRO 100 MG PO CAPS: 100.0000 mg | ORAL_CAPSULE | Freq: Two times a day (BID) | ORAL | Status: DC

## 2014-10-05 NOTE — Telephone Encounter (Signed)
Patient labs came into the office from [place] and the patient has a UTI. Per Dr.Lowne send in Macrobid 100 1 po bid x's 7 days. Rx faxed.      KP

## 2014-10-07 ENCOUNTER — Other Ambulatory Visit: Payer: Self-pay | Admitting: Family Medicine

## 2014-10-08 ENCOUNTER — Ambulatory Visit (INDEPENDENT_AMBULATORY_CARE_PROVIDER_SITE_OTHER): Payer: Medicare Other | Admitting: Cardiology

## 2014-10-08 DIAGNOSIS — Z7901 Long term (current) use of anticoagulants: Secondary | ICD-10-CM

## 2014-10-08 DIAGNOSIS — I4821 Permanent atrial fibrillation: Secondary | ICD-10-CM

## 2014-10-08 DIAGNOSIS — I482 Chronic atrial fibrillation: Secondary | ICD-10-CM

## 2014-10-08 LAB — POCT INR: INR: 3

## 2014-10-08 NOTE — Telephone Encounter (Signed)
Last seen 07/24/14 and filled 08/27/14 #30   Please advise    KP

## 2014-10-16 ENCOUNTER — Telehealth: Payer: Self-pay | Admitting: Family Medicine

## 2014-10-16 ENCOUNTER — Telehealth: Payer: Self-pay

## 2014-10-16 DIAGNOSIS — I482 Chronic atrial fibrillation: Secondary | ICD-10-CM | POA: Diagnosis not present

## 2014-10-16 NOTE — Telephone Encounter (Signed)
°  Relation to [id number] Call back number:[phone] Pharmacy:[facility] [address]  Reason for call: pt would like to know if dr. Etter [name] can please change all of his meds from 30 to 90 days, states since he and his wife are home bound and he has to pay $22 per hr to have a care giver go and pick them up every month, it gets very expensive.

## 2014-10-16 NOTE — Telephone Encounter (Signed)
Is he asking for refills or does he just want [place] to update his chart?     KP

## 2014-10-16 NOTE — Telephone Encounter (Signed)
Received via fax.  Orders for SN.  Orders labeled and placed in provider red folder for review and signature.

## 2014-10-19 ENCOUNTER — Other Ambulatory Visit: Payer: Self-pay | Admitting: Family Medicine

## 2014-10-19 NOTE — Telephone Encounter (Signed)
Note routed to Murtis Sink, [place].

## 2014-10-19 NOTE — Telephone Encounter (Signed)
Pt requested refills for the meds that were due however just wants to make sure that all meds going forward is 90 day supply

## 2014-10-20 ENCOUNTER — Other Ambulatory Visit: Payer: Self-pay | Admitting: Family Medicine

## 2014-10-20 MED ORDER — TRAMADOL HCL 50 MG PO TABS: 50.0000 mg | ORAL_TABLET | Freq: Three times a day (TID) | ORAL | Status: DC | PRN

## 2014-10-20 NOTE — Telephone Encounter (Signed)
Received call from pharmacy. Please refax RX. It was distorted and they cannot read med name. Thanks.

## 2014-10-20 NOTE — Telephone Encounter (Signed)
I made Mrs.Rody aware that I need the names of the medications. She will have her husband cal back. When he calls back could you get the specific medications that he need filled.    KP

## 2014-10-20 NOTE — Telephone Encounter (Signed)
Last seen 07/24/14 and filled 09/15/14 #30  Please advise     KP

## 2014-10-20 NOTE — Telephone Encounter (Signed)
Rx Re-faxed.      KP

## 2014-10-22 NOTE — Telephone Encounter (Signed)
Faxed successfully. Sent for scanning. [id number] 

## 2014-10-23 ENCOUNTER — Ambulatory Visit (HOSPITAL_COMMUNITY)
Admission: RE | Admit: 2014-10-23 | Discharge: 2014-10-23 | Disposition: A | Discharge status: Home / Self Care | Payer: Medicare Other | Source: Ambulatory Visit | Attending: Internal Medicine | Admitting: Internal Medicine

## 2014-10-23 VITALS — BP 129/73 | HR 104 | Resp 20 | Wt 175.5 lb

## 2014-10-23 DIAGNOSIS — E785 Hyperlipidemia, unspecified: Secondary | ICD-10-CM | POA: Diagnosis not present

## 2014-10-23 DIAGNOSIS — I5032 Chronic diastolic (congestive) heart failure: Secondary | ICD-10-CM | POA: Diagnosis not present

## 2014-10-23 DIAGNOSIS — I129 Hypertensive chronic kidney disease with stage 1 through stage 4 chronic kidney disease, or unspecified chronic kidney disease: Secondary | ICD-10-CM | POA: Diagnosis not present

## 2014-10-23 DIAGNOSIS — I252 Old myocardial infarction: Secondary | ICD-10-CM | POA: Diagnosis not present

## 2014-10-23 DIAGNOSIS — I38 Endocarditis, valve unspecified: Secondary | ICD-10-CM

## 2014-10-23 DIAGNOSIS — I071 Rheumatic tricuspid insufficiency: Secondary | ICD-10-CM | POA: Insufficient documentation

## 2014-10-23 DIAGNOSIS — Z7901 Long term (current) use of anticoagulants: Secondary | ICD-10-CM | POA: Insufficient documentation

## 2014-10-23 DIAGNOSIS — Z951 Presence of aortocoronary bypass graft: Secondary | ICD-10-CM | POA: Diagnosis not present

## 2014-10-23 DIAGNOSIS — N189 Chronic kidney disease, unspecified: Secondary | ICD-10-CM | POA: Diagnosis not present

## 2014-10-23 DIAGNOSIS — I5042 Chronic combined systolic (congestive) and diastolic (congestive) heart failure: Secondary | ICD-10-CM | POA: Diagnosis present

## 2014-10-23 DIAGNOSIS — Z79899 Other long term (current) drug therapy: Secondary | ICD-10-CM | POA: Insufficient documentation

## 2014-10-23 DIAGNOSIS — I2510 Atherosclerotic heart disease of native coronary artery without angina pectoris: Secondary | ICD-10-CM | POA: Insufficient documentation

## 2014-10-23 DIAGNOSIS — I509 Heart failure, unspecified: Secondary | ICD-10-CM | POA: Diagnosis not present

## 2014-10-23 DIAGNOSIS — I482 Chronic atrial fibrillation: Secondary | ICD-10-CM | POA: Insufficient documentation

## 2014-10-23 DIAGNOSIS — I50810 Right heart failure, unspecified: Secondary | ICD-10-CM

## 2014-10-23 DIAGNOSIS — Z8249 Family history of ischemic heart disease and other diseases of the circulatory system: Secondary | ICD-10-CM | POA: Diagnosis not present

## 2014-10-23 DIAGNOSIS — I4821 Permanent atrial fibrillation: Secondary | ICD-10-CM

## 2014-10-23 LAB — COMPREHENSIVE METABOLIC PANEL
ALBUMIN: 4.1 g/dL (ref 3.5–5.0)
ALK PHOS: 84 U/L (ref 38–126)
ALT: 16 U/L — AB (ref 17–63)
AST: 25 U/L (ref 15–41)
Anion gap: 8 (ref 5–15)
BILIRUBIN TOTAL: 1.1 mg/dL (ref 0.3–1.2)
BUN: 47 mg/dL — AB (ref 6–20)
CALCIUM: 10 mg/dL (ref 8.9–10.3)
CO2: 31 mmol/L (ref 22–32)
Chloride: 96 mmol/L — ABNORMAL LOW (ref 101–111)
Creatinine, Ser: 2.05 mg/dL — ABNORMAL HIGH (ref 0.61–1.24)
GFR calc Af Amer: 33 mL/min — ABNORMAL LOW (ref >60)
GFR calc non Af Amer: 28 mL/min — ABNORMAL LOW (ref >60)
GLUCOSE: 106 mg/dL — AB (ref 65–99)
Potassium: 4.7 mmol/L (ref 3.5–5.1)
Sodium: 135 mmol/L (ref 135–145)
Total Protein: 7.5 g/dL (ref 6.5–8.1)

## 2014-10-23 LAB — CBC
HEMATOCRIT: 43.1 % (ref 39.0–52.0)
Hemoglobin: 14.1 g/dL (ref 13.0–17.0)
MCH: 29.5 pg (ref 26.0–34.0)
MCHC: 32.7 g/dL (ref 30.0–36.0)
MCV: 90.2 fL (ref 78.0–100.0)
Platelets: 163 10*3/uL (ref 150–400)
RBC: 4.78 MIL/uL (ref 4.22–5.81)
RDW: 14.3 % (ref 11.5–15.5)
WBC: 5.6 10*3/uL (ref 4.0–10.5)

## 2014-10-23 LAB — BRAIN NATRIURETIC PEPTIDE: B Natriuretic Peptide: 194.5 pg/mL — ABNORMAL HIGH (ref 0.0–100.0)

## 2014-10-23 NOTE — Patient Instructions (Signed)
Labs today  Your physician recommends that you schedule a follow-up appointment in: 4 months  Do the following things EVERYDAY: 1) Weigh yourself in the morning before breakfast. Write it down and keep it in a log. 2) Take your medicines as prescribed 3) Eat low salt foods-Limit salt (sodium) to 2000 mg per day.  4) Stay as active as you can everyday 5) Limit all fluids for the day to less than 2 liters 6)   

## 2014-10-23 NOTE — Progress Notes (Signed)
Advanced Heart Failure Medication Review by a Pharmacist  Does the patient  feel that his/her medications are working for him/her?  yes  Has the patient been experiencing any side effects to the medications prescribed?  no  Does the patient measure his/her own blood pressure or blood glucose at home?  yes   Does the patient have any problems obtaining medications due to transportation or finances?   no  Understanding of regimen: good Understanding of indications: good Potential of compliance: good    Pharmacist comments:  Mr. Lunde is a pleasant 81 yo M presenting with his wife and son and an up-to-date medication list. He seems to have a good understanding of his regimen and states that he never misses any doses of any of his medications. He did not have any specific medication-related questions or concerns at this time.   Ruta Hinds. Velva Harman, PharmD, BCPS, CPP Clinical Pharmacist Pager: [phone] Phone: [phone] 10/23/2014 11:54 AM

## 2014-10-25 NOTE — Progress Notes (Signed)
Patient ID: Gary [name]., male   DOB: 1929-07-19, 81 y.o.   MRN: [id number] PCP: Dr Gary [name] Primary Cardiologist: Dr. Stanford [name]  81 yo with history of CAD s/p CABG in 1995, chronic atrial fibrillation, CKD, and chronic diastolic CHF with prominent RV failure presents for CHF clinic evaluation.  Patient has struggled with right-sided failure. He was admitted in 5/16 with abdominal distention, volume overload, and elevated creatinine as high as 6.  Echo showed normal LV EF but RV was moderately dilated with moderate-severe TR, ?Ebsteins anomaly anatomy.  He had a paracentesis for ascites.  V/Q scan was negative for PE. He refused RHC while in the hospital.  He was diuresed and creatinine improved.  Gary [name] returns for followup.  He can walk 100-200 yards without dyspnea.  He has a chair lift to get up the stairs at home but can climb a short flight of steps without problems.  He has been sleeping in a recliner for 3 years.  No PND.  His hobby is woodworking, which he is able to do without problems.  No chest pain.  +low back pain.  His heart rate is controlled without nodal blockers.  Weight is stable. No melena/BRBPR (on warfarin).   He has a chronic foley and has occasional episodes of painful obstruction that take him to the ER.    ECG: atrial fibrillation with LBBB  Labs (5/16): K 3.6, creatinine 6.15 => 2.47, LFTs normal, LDL 23, HCT 25.2 Labs (07/28/14): K 4.6, Creatinine 2.62, BNP 463 Labs (6/16): creatinine 2.23 => 2.01, K 4.4, BNP 227  PMH:  1. CAD: MI 1983, CABG 1995.   2. Chronic diastolic failure with prominent RV failure: Echo (5/16) with EF 55-60%, moderate LV, moderately dilated RV, massive biatrial enlargement, moderate-severe TR with some question of Ebstein's anomaly, PA systolic pressure 37 mmHg.  Patient has had ascites likely related to RV failure with h/o paracentesis.  3. CKD 4. Permanent atrial fibrillation 5. LBBB 6. Hyperlipidemia 7. HTN 8. Complex pancreatic cyst:  Followed by GI.  9. Neurogenic bladder: Chronic foley.  10. Anemia of renal disease.  11. Cholecystectomy 12. Appendectomy  SH: Married, nonsmoker, 4 kids, lives with son.   FH: CAD  ROS: All systems reviewed and negative except as per HPI.   Current Outpatient Prescriptions  Medication Sig Dispense Refill  . acetaminophen (TYLENOL) 500 MG tablet Take 1,000 mg by mouth 3 (three) times daily.    Marland Kitchen atorvastatin (LIPITOR) 20 MG tablet TAKE 1 TABLET BY MOUTH EVERY DAY 30 tablet 5  . ferrous sulfate 325 (65 FE) MG tablet Take 325 mg by mouth daily with breakfast.     . folic acid (FOLVITE) 1 MG tablet Take 1 mg by mouth daily.      . Glucos-Chondroit-Hyaluron-MSM (GLUCOSAMINE CHONDROITIN JOINT PO) Take 1 tablet by mouth 2 (two) times daily.    Marland Kitchen lidocaine (XYLOCAINE) 2 % jelly Place 1 application into the urethra daily as needed (pain).    . Meth-Hyo-M Bl-Na Phos-Ph Sal (URIBEL) 118 MG CAPS TAKE 1 TO 4 CAPSULE BY MOUTH EVERY DAY AS DIRECTED 120 capsule 5  . mirtazapine (REMERON) 15 MG tablet TAKE 1 TABLET(15 MG) BY MOUTH AT BEDTIME 30 tablet 0  . Multiple Vitamin (MULTIVITAMIN) capsule Take 1 capsule by mouth daily.      Marland Kitchen neomycin-bacitracin-polymyxin (NEOSPORIN) ointment Apply 1 application topically daily as needed for wound care.     . nystatin-triamcinolone ointment (MYCOLOG) APPLY EXTERNALLY TO THE AFFECTED AREA TWICE DAILY  60 g 1  . ondansetron (ZOFRAN) 8 MG tablet Take 1 tablet (8 mg total) by mouth every 8 (eight) hours as needed for nausea or vomiting. 90 tablet 1  . torsemide (DEMADEX) 20 MG tablet Take 2 tablets (40 mg total) by mouth 2 (two) times daily. 60 tablet 11  . traMADol (ULTRAM) 50 MG tablet Take 1 tablet (50 mg total) by mouth every 8 (eight) hours as needed. 90 tablet 1  . warfarin (COUMADIN) 2.5 MG tablet TAKE 1 TABLET BY MOUTH DAILY AS DIRECTED BY COUMADIN CLINIC 30 tablet 0   No current facility-administered medications for this encounter.   BP 129/73 mmHg   Pulse 104  Resp 20  Wt 175 lb 8 oz (79.606 kg)  SpO2 100% General: NAD Neck: JVP 7 cm, no thyromegaly or thyroid nodule.  Lungs: Clear to auscultation bilaterally with normal respiratory effort. CV: Nondisplaced PMI.  Heart irregular S1/S2, no S3/S4, 1/6 HSM LLSB.  Trace ankle edema.  No carotid bruit.  Normal pedal pulses.  Abdomen: Soft, nontender, no hepatosplenomegaly, mild distention.  Skin: Intact without lesions or rashes.  Neurologic: Alert and oriented x 3.  Psych: Normal affect. Extremities: No clubbing or cyanosis.  HEENT: Normal.   Assessment/Plan: 1. Atrial fibrillation: Permanent.  Markedly dilated atria on echo, unlikely to stay in NSR if DCCV were to be attempted. He is on warfarin.  He has not been on any nodal blockers and HR is reasonably controlled.  Check CBC with warfarin use.  2. Chronic diastolic CHF with prominent RV failure: On exam, he is not volume overloaded.  NYHA class II-III symptoms. Much improved compared to prior to hospitalization.  - Continue torsemide 40 mg bid. Check BMET today.  3. Tricuspid regurgitation: Moderate to severe TR.  There was question of possible Ebstein's anomaly on echo. Regardless, will be managed medically. Need to keep decongested with torsemide.  4. Ascites: Suspect this has been due hepatic congestion from RV failure.  Check LFTs today.   5. CKD: Creatinine much improved, down to 2.01 most recently.  Suspect cardiorenal component.  He is seeing Dr Gary [name] for nephrology.  HD would be difficult with RV failure.  6. CAD: s/p CABG.  No chest pain.  Continue statin (recent LDL excellent).  No ASA with use of warfarin and stable CAD.   Followup in 4 months.   Gary [name] 10/25/2014

## 2014-10-26 ENCOUNTER — Other Ambulatory Visit: Payer: Self-pay | Admitting: Family Medicine

## 2014-10-26 NOTE — Telephone Encounter (Signed)
Last seen 07/24/14 and filled 10/08/14 #30   Please advise.     KP

## 2014-10-28 ENCOUNTER — Telehealth: Payer: Self-pay | Admitting: Family Medicine

## 2014-10-28 NOTE — Telephone Encounter (Signed)
Ok to fill #90 3 refills  

## 2014-10-28 NOTE — Telephone Encounter (Signed)
Ok to fill 90 day supply for mirtazapine?

## 2014-10-28 NOTE — Telephone Encounter (Signed)
Mirtazapine needs to be send for 90 day supply. All future meds for 90 day supply. Pt pays someone $22/hr to go pick up RXs and extra trips cost him a lot of money. Please resend the RX from 10/26/14.

## 2014-10-29 ENCOUNTER — Ambulatory Visit (INDEPENDENT_AMBULATORY_CARE_PROVIDER_SITE_OTHER): Payer: Medicare Other | Admitting: Internal Medicine

## 2014-10-29 DIAGNOSIS — Z7901 Long term (current) use of anticoagulants: Secondary | ICD-10-CM

## 2014-10-29 DIAGNOSIS — I482 Chronic atrial fibrillation: Secondary | ICD-10-CM

## 2014-10-29 DIAGNOSIS — I4821 Permanent atrial fibrillation: Secondary | ICD-10-CM

## 2014-10-29 LAB — POCT INR: INR: 2.3

## 2014-10-29 MED ORDER — MIRTAZAPINE 15 MG PO TABS: ORAL_TABLET | ORAL | Status: DC

## 2014-10-29 NOTE — Telephone Encounter (Signed)
Rx sent to pharmacy   

## 2014-10-30 ENCOUNTER — Other Ambulatory Visit: Payer: Self-pay | Admitting: Family Medicine

## 2014-11-18 ENCOUNTER — Other Ambulatory Visit: Payer: Self-pay | Admitting: Family Medicine

## 2014-11-18 NOTE — Telephone Encounter (Signed)
Caller name: Gary [name] Relationship to patient: self Can be reached: [phone] Pharmacy: [facility] [place]  Reason for call: Pt stating the mirtazapine 15mg  is not working to help him sleep. It doesn't bother him, but it just doesn't help. He states that he needs 90 day supply sent in if a new med is sent. They have all meds on 90 day supply bc he and his wife are house bound and have to pay someone $22/hr to go pick up RXs for them.

## 2014-11-19 MED ORDER — MIRTAZAPINE 30 MG PO TABS: 30.0000 mg | ORAL_TABLET | Freq: Every day | ORAL | Status: DC

## 2014-11-19 NOTE — Telephone Encounter (Signed)
Can try to increase the remeron 30 mg #90 1 refills

## 2014-11-19 NOTE — Telephone Encounter (Signed)
Pt notified of change.  He stated understanding and agrees with change.  New Rx sent to pharmacy.

## 2014-11-26 ENCOUNTER — Telehealth: Payer: Self-pay | Admitting: Family Medicine

## 2014-11-26 ENCOUNTER — Ambulatory Visit (INDEPENDENT_AMBULATORY_CARE_PROVIDER_SITE_OTHER): Payer: Medicare Other | Admitting: Cardiology

## 2014-11-26 DIAGNOSIS — Z7901 Long term (current) use of anticoagulants: Secondary | ICD-10-CM

## 2014-11-26 DIAGNOSIS — I482 Chronic atrial fibrillation: Secondary | ICD-10-CM

## 2014-11-26 DIAGNOSIS — I4821 Permanent atrial fibrillation: Secondary | ICD-10-CM

## 2014-11-26 LAB — POCT INR: INR: 1.1

## 2014-11-26 NOTE — Telephone Encounter (Signed)
Pt wants cath changed prn only, currently order is every 30 days and prn. Pt was due to have changed this Monday but pt declined due to having good flow. Please call to update order. Ok to leave VM if she doesn't answer.  Marcene Brawn with [facility] Ph# [phone]

## 2014-11-26 NOTE — Telephone Encounter (Signed)
Please advise 

## 2014-11-26 NOTE — Telephone Encounter (Signed)
Its not good to let it go more than a month.  ------ it should be changed monthly

## 2014-11-27 ENCOUNTER — Other Ambulatory Visit: Payer: Self-pay | Admitting: Family Medicine

## 2014-11-27 NOTE — Telephone Encounter (Signed)
Spoke with Marcene Brawn and the new recommendation CMS standard is every 30 days, although the patient stated it was approved by one of his providers that it was ok to have it done every 60. She said she has asked the patient to call his Nephrologist but he said Dr.Lowne handles his home health.  I called the Nephrologist to get clarification but Rutherford Nail said Dr.Sanford is on call and I would need to page him. I have paged [phone]     KP

## 2014-11-27 NOTE — Telephone Encounter (Signed)
Not my patient

## 2014-11-27 NOTE — Telephone Encounter (Signed)
Spoke with patient and he stated that he stated that he normally has it done every 60 days.

## 2014-12-02 NOTE — Telephone Encounter (Signed)
I have tried to call the Nephrologist again and left a message for Charlena Cross to return my call.     KP

## 2014-12-03 ENCOUNTER — Ambulatory Visit (INDEPENDENT_AMBULATORY_CARE_PROVIDER_SITE_OTHER): Payer: Medicare Other | Admitting: Pharmacist

## 2014-12-03 DIAGNOSIS — I4821 Permanent atrial fibrillation: Secondary | ICD-10-CM

## 2014-12-03 DIAGNOSIS — I482 Chronic atrial fibrillation: Secondary | ICD-10-CM

## 2014-12-03 DIAGNOSIS — Z7901 Long term (current) use of anticoagulants: Secondary | ICD-10-CM

## 2014-12-03 LAB — POCT INR: INR: 2.5

## 2014-12-04 ENCOUNTER — Telehealth: Payer: Self-pay

## 2014-12-04 ENCOUNTER — Encounter: Payer: Self-pay | Admitting: Family Medicine

## 2014-12-04 NOTE — Telephone Encounter (Signed)
5:33 PM Per Charlena Cross from [facility], patient will need to call his Urologist to get orders regarding changing his his catheter.  Spoke with patient who states his catheter was placed by [facility] and he was told as long as he had good urine flow he did not need to change catheter. Unable to call [facility] at this hour.

## 2014-12-08 NOTE — Telephone Encounter (Signed)
Detailed message left for Marcene Brawn advising no response from Nephrology and she will to get further direction from them in reference to Cath changes. Sent to MD to make her aware.      KP

## 2014-12-10 ENCOUNTER — Ambulatory Visit (INDEPENDENT_AMBULATORY_CARE_PROVIDER_SITE_OTHER): Payer: Medicare Other | Admitting: Interventional Cardiology

## 2014-12-10 DIAGNOSIS — Z7901 Long term (current) use of anticoagulants: Secondary | ICD-10-CM

## 2014-12-10 DIAGNOSIS — I482 Chronic atrial fibrillation: Secondary | ICD-10-CM

## 2014-12-10 DIAGNOSIS — I4821 Permanent atrial fibrillation: Secondary | ICD-10-CM

## 2014-12-10 LAB — POCT INR: INR: 3.2

## 2014-12-23 ENCOUNTER — Telehealth: Payer: Self-pay | Admitting: *Deleted

## 2014-12-23 NOTE — Telephone Encounter (Signed)
Home health cert & POC received via fax from [facility] for cert period: [id number]. Forwarded to Dr. Etter Sjogren. JG//CMA

## 2014-12-24 ENCOUNTER — Ambulatory Visit (INDEPENDENT_AMBULATORY_CARE_PROVIDER_SITE_OTHER): Payer: Medicare Other | Admitting: Cardiology

## 2014-12-24 DIAGNOSIS — Z7901 Long term (current) use of anticoagulants: Secondary | ICD-10-CM

## 2014-12-24 DIAGNOSIS — I482 Chronic atrial fibrillation: Secondary | ICD-10-CM | POA: Diagnosis not present

## 2014-12-24 DIAGNOSIS — Z466 Encounter for fitting and adjustment of urinary device: Secondary | ICD-10-CM | POA: Diagnosis not present

## 2014-12-24 DIAGNOSIS — N319 Neuromuscular dysfunction of bladder, unspecified: Secondary | ICD-10-CM | POA: Diagnosis not present

## 2014-12-24 DIAGNOSIS — I4821 Permanent atrial fibrillation: Secondary | ICD-10-CM

## 2014-12-24 DIAGNOSIS — I130 Hypertensive heart and chronic kidney disease with heart failure and stage 1 through stage 4 chronic kidney disease, or unspecified chronic kidney disease: Secondary | ICD-10-CM | POA: Diagnosis not present

## 2014-12-24 LAB — POCT INR: INR: 2.1

## 2014-12-25 ENCOUNTER — Telehealth (HOSPITAL_COMMUNITY): Payer: Self-pay

## 2014-12-25 MED ORDER — APIXABAN 2.5 MG PO TABS: 2.5000 mg | ORAL_TABLET | Freq: Two times a day (BID) | ORAL | Status: DC

## 2014-12-25 NOTE — Telephone Encounter (Signed)
He could take Eliquis 2.5 mg bid (stop warfarin x 3 days then start Eliquis).  However, if renal function worsens much he would have to go back to warfarin.

## 2014-12-25 NOTE — Telephone Encounter (Signed)
Returned call to patient.  Sending message to Dr. Aundra Dubin of patients desire to change from Warfarin to Eliquis

## 2014-12-25 NOTE — Telephone Encounter (Signed)
Patient is going to get RX filled for Eliquis 2.5 mg twice a day. Stopping Warfarin for 3 days prior to starting Eliquis Understands that if his kidney function worsens he will have to go back to Warfarin.  Repeated back instructions correctly.   BMET scheduled for 11/11 when wife is here for her appointment

## 2014-12-28 ENCOUNTER — Other Ambulatory Visit: Payer: Self-pay | Admitting: Cardiology

## 2015-01-01 ENCOUNTER — Other Ambulatory Visit: Payer: Self-pay | Admitting: Family Medicine

## 2015-01-05 ENCOUNTER — Telehealth: Payer: Self-pay

## 2015-01-05 NOTE — Telephone Encounter (Signed)
Prior auth for Eliquis 5 mg sent to [facility] 

## 2015-01-06 ENCOUNTER — Telehealth: Payer: Self-pay

## 2015-01-06 NOTE — Telephone Encounter (Signed)
Approval for Eliquis 2.5mg  from [facility]. Good through 01/05/2016. [id number].

## 2015-01-08 ENCOUNTER — Other Ambulatory Visit: Payer: Self-pay | Admitting: Family Medicine

## 2015-01-08 ENCOUNTER — Other Ambulatory Visit (HOSPITAL_COMMUNITY): Payer: Medicare Other

## 2015-01-10 ENCOUNTER — Other Ambulatory Visit: Payer: Self-pay | Admitting: Family Medicine

## 2015-01-11 ENCOUNTER — Other Ambulatory Visit: Payer: Self-pay | Admitting: Family Medicine

## 2015-01-12 ENCOUNTER — Other Ambulatory Visit: Payer: Self-pay | Admitting: Family Medicine

## 2015-01-12 NOTE — Telephone Encounter (Signed)
Last seen 07/24/14 and filled 10/20/14 #90 with 1 refill   Please advise      KP

## 2015-01-13 ENCOUNTER — Telehealth: Payer: Self-pay | Admitting: Family Medicine

## 2015-01-13 NOTE — Telephone Encounter (Signed)
Please advise      KP 

## 2015-01-13 NOTE — Telephone Encounter (Signed)
Pt called about the RX for torsemide that we sent in. He states that he was getting 300 pills at a time and takes 2 pills 2xday, 4/day total. Pt states that we sent in for 60. He would like [place] to update RX and send to [id number] - [place], [address] AT [address]. It was previously ordered by a cardiology office.

## 2015-01-14 NOTE — Telephone Encounter (Signed)
Cardiology should be filling it

## 2015-01-15 MED ORDER — TORSEMIDE 20 MG PO TABS: 40.0000 mg | ORAL_TABLET | Freq: Two times a day (BID) | ORAL | Status: DC

## 2015-01-15 NOTE — Telephone Encounter (Signed)
Refill sent.

## 2015-01-15 NOTE — Telephone Encounter (Signed)
Please make sure this has been taken care of

## 2015-01-20 LAB — BASIC METABOLIC PANEL
BUN: 46 mg/dL — AB (ref 4–21)
CREATININE: 2 mg/dL — AB (ref 0.6–1.3)
GLUCOSE: 92 mg/dL
POTASSIUM: 4.1 mmol/L (ref 3.4–5.3)
SODIUM: 145 mmol/L (ref 137–147)

## 2015-01-20 LAB — CBC AND DIFFERENTIAL
HCT: 40 % — AB (ref 41–53)
HEMOGLOBIN: 13.2 g/dL — AB (ref 13.5–17.5)
Neutrophils Absolute: 3 /uL
Platelets: 147 10*3/uL — AB (ref 150–399)
WBC: 4.4 10*3/mL

## 2015-01-28 ENCOUNTER — Telehealth (HOSPITAL_COMMUNITY): Payer: Self-pay

## 2015-01-28 MED ORDER — APIXABAN 2.5 MG PO TABS: 2.5000 mg | ORAL_TABLET | Freq: Two times a day (BID) | ORAL | Status: DC

## 2015-01-28 NOTE — Telephone Encounter (Signed)
Needs quanity of Eliquist increased from 30 day supply to 90 day supply since both he and his wife are house boun and only are able to get out once a week by care giver Sent RX refill on [facility] from 30 day supply to 90 day.  # 180 X's 3 refills

## 2015-01-29 ENCOUNTER — Telehealth (HOSPITAL_COMMUNITY): Payer: Self-pay

## 2015-01-29 NOTE — Telephone Encounter (Signed)
Patient has an appointment on 12/14 and would like to know if he needs any lab drawn. Told him after the MD's evaluation we will draw what ever labs he orders right here in the office

## 2015-02-01 ENCOUNTER — Other Ambulatory Visit: Payer: Self-pay | Admitting: Family Medicine

## 2015-02-01 ENCOUNTER — Telehealth: Payer: Self-pay

## 2015-02-01 NOTE — Telephone Encounter (Signed)
Hx.  Neurogenic bladder, chronic Foley, chronic kidney disease, CHF, on Eliquis  C/o:  States need catheter changed.  He says he called [facility] on last Wednesday and was told that no one was available  to come out.  Pt called again today and still has been unable to get anyone to come out.  Pt states his urine stream is slower and he has increased abdominal discomfort (was unable to quantify) and slight swelling in lower abdomen.  Per patient, he currently has 400-500 cc in Foley bag since last night.  Urine is clear, yellow.  No blood noted.  No fever.  Called [facility] to see if a nurse was available to go out and assess patient.  Santiago Glad says that nurse Myrtha Mantis is suppose to go out and check on patient.  No time was given, but Santiago Glad says that nurse will call patient first.   An urgent request was put in for nurse to check on patient ASAP.  Santiago Glad says that she will notify nurse and make her aware of patient's situation.   Pt notified and made aware.  He stated understanding.  He was advised if symptoms worsen or no one comes out to assess Foley, to go to ER.  He stated understanding and agreed.

## 2015-02-01 NOTE — Telephone Encounter (Signed)
Noted.  Agree w/ advice given

## 2015-02-02 NOTE — Telephone Encounter (Signed)
Filled

## 2015-02-03 ENCOUNTER — Telehealth (HOSPITAL_COMMUNITY): Payer: Self-pay | Admitting: *Deleted

## 2015-02-03 NOTE — Telephone Encounter (Signed)
Received VM left by pt states he is not going to be able to afford Eliquis and will need assistance.  He stated the paperwork could be mailed to his home and he can complete and bring back at this appt on 12/14.  Will forward to [place], PharmD to f/u with pt

## 2015-02-04 ENCOUNTER — Other Ambulatory Visit (HOSPITAL_COMMUNITY): Payer: Self-pay | Admitting: *Deleted

## 2015-02-04 ENCOUNTER — Other Ambulatory Visit (HOSPITAL_COMMUNITY): Payer: Self-pay | Admitting: Pharmacist

## 2015-02-04 MED ORDER — APIXABAN 2.5 MG PO TABS: 2.5000 mg | ORAL_TABLET | Freq: Two times a day (BID) | ORAL | Status: DC

## 2015-02-04 NOTE — Telephone Encounter (Signed)
Eliquis BMS patient assistance application will be mailed out today.   Ruta Hinds. Velva Harman, PharmD, BCPS, CPP Clinical Pharmacist Pager: [phone] Phone: [phone] 02/04/2015 10:52 AM

## 2015-02-05 ENCOUNTER — Telehealth (HOSPITAL_COMMUNITY): Payer: Self-pay | Admitting: Cardiology

## 2015-02-05 NOTE — Telephone Encounter (Signed)
Pt left message for the pharmacist nurse Pt is requesting assistance with Eliquis Patient states he wife just received assistance and he would like to complete application as well   please advise

## 2015-02-08 ENCOUNTER — Encounter: Payer: Self-pay | Admitting: Family Medicine

## 2015-02-10 ENCOUNTER — Ambulatory Visit (INDEPENDENT_AMBULATORY_CARE_PROVIDER_SITE_OTHER): Payer: Medicare Other | Admitting: *Deleted

## 2015-02-10 DIAGNOSIS — I4891 Unspecified atrial fibrillation: Secondary | ICD-10-CM

## 2015-02-10 LAB — BASIC METABOLIC PANEL
BUN: 56 mg/dL — ABNORMAL HIGH (ref 7–25)
CALCIUM: 10 mg/dL (ref 8.6–10.3)
CHLORIDE: 97 mmol/L — AB (ref 98–110)
CO2: 33 mmol/L — AB (ref 20–31)
Creat: 2.11 mg/dL — ABNORMAL HIGH (ref 0.70–1.11)
GLUCOSE: 76 mg/dL (ref 65–99)
POTASSIUM: 4 mmol/L (ref 3.5–5.3)
SODIUM: 138 mmol/L (ref 135–146)

## 2015-02-10 LAB — CBC
HCT: 40.8 % (ref 39.0–52.0)
HEMOGLOBIN: 13.9 g/dL (ref 13.0–17.0)
MCH: 29.4 pg (ref 26.0–34.0)
MCHC: 34.1 g/dL (ref 30.0–36.0)
MCV: 86.3 fL (ref 78.0–100.0)
MPV: 10.2 fL (ref 8.6–12.4)
PLATELETS: 145 10*3/uL — AB (ref 150–400)
RBC: 4.73 MIL/uL (ref 4.22–5.81)
RDW: 15 % (ref 11.5–15.5)
WBC: 4.3 10*3/uL (ref 4.0–10.5)

## 2015-02-10 NOTE — Progress Notes (Signed)
Pt was started on Eliquis 2.5mg  twice a day for AFIB on Dr. Aundra Dubin.    Reviewed patients medication list.  Pt is not currently on any combined P-gp and strong CYP3A4 inhibitors/inducers (ketoconazole, traconazole, ritonavir, carbamazepine, phenytoin, rifampin, St. John's wort).  Reviewed labs: SCr-2.11, Weight-85.4kg, Hgb-13.9, HCT-40.8.  Dose appropriate based on dosing criteria.   Hgb and HCT Within Normal Limits  A full discussion of the nature of anticoagulants has been carried out.  A benefit/risk analysis has been presented to the patient, so that they understand the justification for choosing anticoagulation with Eliquis at this time.  The need for compliance is stressed.  Pt is aware to take the medication twice daily.  Side effects of potential bleeding are discussed, including unusual colored urine or stools, coughing up blood or coffee ground emesis, nose bleeds or serious fall or head trauma.  Discussed signs and symptoms of stroke. The patient should avoid any OTC items containing aspirin or ibuprofen.  Avoid alcohol consumption.   Call if any signs of abnormal bleeding.  Discussed financial obligations and resolved any difficulty in obtaining medication.    Patient states he is having financial difficulty with buying this medication, he states that someone is working with him through a patient financial program to lower his copayment.  He was reminded to follow-up with them today and if he can no longer afford the medication at anytime we need to know so that we can notify Dr. Aundra Dubin and maybe switch him to a medication that is more affordable; He verbalized understanding and stated he would keep [place] updated.    02/11/15 Called patient and discussed lab results and instructed him to continue taking 2.5mg  twice a day. He is aware that if he is having any issues to contact us and I reminded him to follow up with the financial assistance program representative today to ensure he will be able  continue purchasing the medication and he stated he would call them today.  He stated he needed to talk to his wife about the issue before he begin to do so because she is familiar with the process. Appointment was set for 6 month follow-up.

## 2015-02-12 ENCOUNTER — Ambulatory Visit (INDEPENDENT_AMBULATORY_CARE_PROVIDER_SITE_OTHER): Payer: Medicare Other | Admitting: Family Medicine

## 2015-02-12 ENCOUNTER — Encounter: Payer: Self-pay | Admitting: Family Medicine

## 2015-02-12 VITALS — BP 124/60 | HR 86 | Temp 97.7°F | Ht 72.0 in | Wt 182.0 lb

## 2015-02-12 DIAGNOSIS — I38 Endocarditis, valve unspecified: Secondary | ICD-10-CM | POA: Diagnosis not present

## 2015-02-12 DIAGNOSIS — E785 Hyperlipidemia, unspecified: Secondary | ICD-10-CM

## 2015-02-12 DIAGNOSIS — N19 Unspecified kidney failure: Secondary | ICD-10-CM

## 2015-02-12 DIAGNOSIS — B354 Tinea corporis: Secondary | ICD-10-CM

## 2015-02-12 MED ORDER — NAFTIFINE HCL 1 % EX CREA: TOPICAL_CREAM | Freq: Every day | CUTANEOUS | Status: DC

## 2015-02-12 NOTE — Progress Notes (Signed)
Patient ID: Gary Lope Sr., male    DOB: 1929-08-25  Age: 81 y.o. MRN: [id number]    Subjective:  Subjective HPI [name] presents for f/u cholesterol, chf, kidney etc.  No complaints.    Review of Systems  Constitutional: Negative for diaphoresis, appetite change, fatigue and unexpected weight change.  Eyes: Negative for pain, redness and visual disturbance.  Respiratory: Negative for cough, chest tightness, shortness of breath and wheezing.   Cardiovascular: Negative for chest pain, palpitations and leg swelling.  Endocrine: Negative for cold intolerance, heat intolerance, polydipsia, polyphagia and polyuria.  Genitourinary: Negative for dysuria, frequency and difficulty urinating.  Neurological: Negative for dizziness, light-headedness, numbness and headaches.    History Past Medical History  Diagnosis Date  . Neurogenic bladder     has had bladder catheter changes at the urology office every 12 weeks  . Anemia   . Hyperlipidemia   . Hypertension   . Heart attack ([place])     1983  . Chronic systolic heart failure (HCC)     a. prior EF 20-30%;  b. Echo 7/09: EF 35-40%, basal inferoposterior HK, mild AI, mild to moderate MR, moderate to marked LAE, mild to moderate TR, mildly increased PASP, moderate RAE;   c. Echo 01/2012:   mild LVH, EF 50%, Tr AI, mild MR, severe LAE, mod to severe RAE, mod to severe RVE, mod reduced RVSF, mod to severe TR, PASP 46  . Arrhythmia     afib  . Warfarin anticoagulation   . GERD (gastroesophageal reflux disease)   . Anxiety   . Renal failure   . CHF (congestive heart failure) ([place])   . Coronary artery disease     He has past surgical history that includes Appendectomy; Cholecystectomy; and Coronary artery bypass graft.   His family history includes Breast cancer in his sister; Cancer in his mother; Heart attack in his father.He reports that he has quit smoking. He has never used smokeless tobacco. He reports that he does not  drink alcohol or use illicit drugs.  Current Outpatient Prescriptions on File Prior to Visit  Medication Sig Dispense Refill  . acetaminophen (TYLENOL) 500 MG tablet Take 1,000 mg by mouth 3 (three) times daily.    Marland Kitchen apixaban (ELIQUIS) 2.5 MG TABS tablet Take 1 tablet (2.5 mg total) by mouth 2 (two) times daily. 180 tablet 3  . atorvastatin (LIPITOR) 20 MG tablet TAKE 1 TABLET BY MOUTH EVERY DAY 30 tablet 5  . ferrous sulfate 325 (65 FE) MG tablet Take 325 mg by mouth daily with breakfast.     . folic acid (FOLVITE) 1 MG tablet Take 1 mg by mouth daily.      . Glucos-Chondroit-Hyaluron-MSM (GLUCOSAMINE CHONDROITIN JOINT PO) Take 1 tablet by mouth 2 (two) times daily.    Marland Kitchen lidocaine (XYLOCAINE) 2 % jelly Place 1 application into the urethra daily as needed (pain).    . Meth-Hyo-M Bl-Na Phos-Ph Sal (URIBEL) 118 MG CAPS TAKE 1 TO 4 CAPSULE BY MOUTH EVERY DAY AS DIRECTED 120 capsule 5  . mirtazapine (REMERON) 30 MG tablet Take 1 tablet (30 mg total) by mouth at bedtime. 90 tablet 1  . Multiple Vitamin (MULTIVITAMIN) capsule Take 1 capsule by mouth daily.      Marland Kitchen neomycin-bacitracin-polymyxin (NEOSPORIN) ointment Apply 1 application topically daily as needed for wound care.     . nystatin-triamcinolone ointment (MYCOLOG) APPLY EXTERNALLY TO THE AFFECTED AREA TWICE DAILY 60 g 0  . ondansetron (ZOFRAN) 8 MG  tablet TAKE 1 TABLET BY MOUTH EVERY 8 HOURS AS NEEDED FOR NAUSEA AND VOMITING 90 tablet 1  . torsemide (DEMADEX) 20 MG tablet Take 2 tablets (40 mg total) by mouth 2 (two) times daily. 360 tablet 2  . traMADol (ULTRAM) 50 MG tablet TAKE 1 TABLET BY MOUTH EVERY 8 HOURS AS NEEDED 90 tablet 0  . triamcinolone cream (KENALOG) 0.1 % APPLY TO THE AFFECTED AREA TWICE DAILY AS NEEDED; DO NOT USE ON FACE, GROIN, OR UNDERARMS 454 g 0   No current facility-administered medications on file prior to visit.     Objective:  Objective Physical Exam  Constitutional: He is oriented to person, place, and time.  Vital signs are normal. He appears well-developed and well-nourished. He is sleeping.  HENT:  Head: Normocephalic and atraumatic.  Mouth/Throat: Oropharynx is clear and moist.  Eyes: EOM are normal. Pupils are equal, round, and reactive to light.  Neck: Normal range of motion. Neck supple. No thyromegaly present.  Cardiovascular: Normal rate and regular rhythm.   No murmur heard. Pulmonary/Chest: Effort normal and breath sounds normal. No respiratory distress. He has no wheezes. He has no rales. He exhibits no tenderness.  Musculoskeletal: He exhibits no edema or tenderness.  Neurological: He is alert and oriented to person, place, and time.  Skin: Skin is warm and dry. Rash noted. There is erythema.     Psychiatric: He has a normal mood and affect. His behavior is normal. Judgment and thought content normal.   BP 124/60 mmHg  Pulse 86  Temp(Src) 97.7 F (36.5 C) (Oral)  Ht 6' (1.829 m)  Wt 182 lb (82.555 kg)  BMI 24.68 kg/m2  SpO2 98% Wt Readings from Last 3 Encounters:  02/12/15 182 lb (82.555 kg)  10/23/14 175 lb 8 oz (79.606 kg)  09/27/14 170 lb (77.111 kg)     Lab Results  Component Value Date   WBC 4.3 02/10/2015   HGB 13.9 02/10/2015   HCT 40.8 02/10/2015   PLT 145* 02/10/2015   GLUCOSE 76 02/10/2015   CHOL 63 07/24/2014   TRIG 85.0 07/24/2014   HDL 23.40* 07/24/2014   LDLCALC 23 07/24/2014   ALT 16* 10/23/2014   AST 25 10/23/2014   NA 138 02/10/2015   K 4.0 02/10/2015   CL 97* 02/10/2015   CREATININE 2.11* 02/10/2015   BUN 56* 02/10/2015   CO2 33* 02/10/2015   TSH 2.428 07/14/2014   PSA 13.12* 10/20/2011   INR 2.1 12/24/2014    No results found.   Assessment & Plan:  Plan I am having [name] start on naftifine. I am also having him maintain his multivitamin, folic acid, ferrous sulfate, acetaminophen, lidocaine, Glucos-Chondroit-Hyaluron-MSM (GLUCOSAMINE CHONDROITIN JOINT PO), URIBEL, neomycin-bacitracin-polymyxin, atorvastatin, mirtazapine,  triamcinolone cream, ondansetron, traMADol, torsemide, nystatin-triamcinolone ointment, and apixaban.  Meds ordered this encounter  Medications  . naftifine (NAFTIN) 1 % cream    Sig: Apply topically daily.    Dispense:  30 g    Refill:  0    Problem List Items Addressed This Visit    Valvular heart disease - moderate MR, mild AI, severe TR    Per cardiology On eliquis      Renal failure    Per nephrology      Hyperlipidemia    con't lipitor F/u labs       Other Visit Diagnoses    Tinea corporis    -  Primary    Relevant Medications    naftifine (NAFTIN) 1 % cream  1. Tinea corporis   - naftifine (NAFTIN) 1 % cream; Apply topically daily.  Dispense: 30 g; Refill: 0   Follow-up: Return in about 6 months (around 08/13/2015), or if symptoms worsen or fail to improve, for hypertension.  Garnet Koyanagi, DO

## 2015-02-12 NOTE — Patient Instructions (Signed)
Hypertension Hypertension, commonly called high blood pressure, is when the force of blood pumping through your arteries is too strong. Your arteries are the blood vessels that carry blood from your heart throughout your body. A blood pressure reading consists of a higher number over a lower number, such as 110/72. The higher number (systolic) is the pressure inside your arteries when your heart pumps. The lower number (diastolic) is the pressure inside your arteries when your heart relaxes. Ideally you want your blood pressure below 120/80. Hypertension forces your heart to work harder to pump blood. Your arteries may become narrow or stiff. Having untreated or uncontrolled hypertension can cause heart attack, stroke, kidney disease, and other problems. RISK FACTORS Some risk factors for high blood pressure are controllable. Others are not.  Risk factors you cannot control include:   Race. You may be at higher risk if you are African American.  Age. Risk increases with age.  Gender. Men are at higher risk than women before age 45 years. After age 65, women are at higher risk than men. Risk factors you can control include:  Not getting enough exercise or physical activity.  Being overweight.  Getting too much fat, sugar, calories, or salt in your diet.  Drinking too much alcohol. SIGNS AND SYMPTOMS Hypertension does not usually cause signs or symptoms. Extremely high blood pressure (hypertensive crisis) may cause headache, anxiety, shortness of breath, and nosebleed. DIAGNOSIS To check if you have hypertension, your health care provider will measure your blood pressure while you are seated, with your arm held at the level of your heart. It should be measured at least twice using the same arm. Certain conditions can cause a difference in blood pressure between your right and left arms. A blood pressure reading that is higher than normal on one occasion does not mean that you need treatment. If  it is not clear whether you have high blood pressure, you may be asked to return on a different day to have your blood pressure checked again. Or, you may be asked to monitor your blood pressure at home for 1 or more weeks. TREATMENT Treating high blood pressure includes making lifestyle changes and possibly taking medicine. Living a healthy lifestyle can help lower high blood pressure. You may need to change some of your habits. Lifestyle changes may include:  Following the DASH diet. This diet is high in fruits, vegetables, and whole grains. It is low in salt, red meat, and added sugars.  Keep your sodium intake below 2,300 mg per day.  Getting at least 30-45 minutes of aerobic exercise at least 4 times per week.  Losing weight if necessary.  Not smoking.  Limiting alcoholic beverages.  Learning ways to reduce stress. Your health care provider may prescribe medicine if lifestyle changes are not enough to get your blood pressure under control, and if one of the following is true:  You are 18-59 years of age and your systolic blood pressure is above 140.  You are 60 years of age or older, and your systolic blood pressure is above 150.  Your diastolic blood pressure is above 90.  You have diabetes, and your systolic blood pressure is over 140 or your diastolic blood pressure is over 90.  You have kidney disease and your blood pressure is above 140/90.  You have heart disease and your blood pressure is above 140/90. Your personal target blood pressure may vary depending on your medical conditions, your age, and other factors. HOME CARE INSTRUCTIONS    Have your blood pressure rechecked as directed by your health care provider.   Take medicines only as directed by your health care provider. Follow the directions carefully. Blood pressure medicines must be taken as prescribed. The medicine does not work as well when you skip doses. Skipping doses also puts you at risk for  problems.  Do not smoke.   Monitor your blood pressure at home as directed by your health care provider. SEEK MEDICAL CARE IF:   You think you are having a reaction to medicines taken.  You have recurrent headaches or feel dizzy.  You have swelling in your ankles.  You have trouble with your vision. SEEK IMMEDIATE MEDICAL CARE IF:  You develop a severe headache or confusion.  You have unusual weakness, numbness, or feel faint.  You have severe chest or abdominal pain.  You vomit repeatedly.  You have trouble breathing. MAKE SURE YOU:   Understand these instructions.  Will watch your condition.  Will get help right away if you are not doing well or get worse.   This information is not intended to replace advice given to you by your health care provider. Make sure you discuss any questions you have with your health care provider.   Document Released: 02/13/2005 Document Revised: 06/30/2014 Document Reviewed: 12/06/2012 Elsevier Interactive Patient Education 2016 Elsevier Inc.  

## 2015-02-14 NOTE — Assessment & Plan Note (Signed)
Per nephrology 

## 2015-02-14 NOTE — Assessment & Plan Note (Signed)
Per cardiology ?On eliquis  ?

## 2015-02-14 NOTE — Assessment & Plan Note (Signed)
con't lipitor F/u labs

## 2015-02-18 ENCOUNTER — Other Ambulatory Visit: Payer: Self-pay | Admitting: Family Medicine

## 2015-02-22 ENCOUNTER — Other Ambulatory Visit: Payer: Self-pay | Admitting: Family Medicine

## 2015-02-24 ENCOUNTER — Telehealth: Payer: Self-pay | Admitting: *Deleted

## 2015-02-24 NOTE — Telephone Encounter (Signed)
Received [facility] Certification and Plan of Care fro [facility] [address]; forwarded to provider/SLS 12/28

## 2015-02-26 NOTE — Telephone Encounter (Signed)
Eliquis was approved by [employer] patient assistance and patient will receive 3 month supply before the end of the year. If still needs assistance in 2017, will need to reapply.   Ruta Hinds. Velva Harman, PharmD, BCPS, CPP Clinical Pharmacist Pager: [phone] Phone: [phone] 02/26/2015 3:34 PM

## 2015-03-14 ENCOUNTER — Other Ambulatory Visit: Payer: Self-pay | Admitting: Family Medicine

## 2015-03-16 NOTE — Telephone Encounter (Signed)
Last seen 02/12/15 and filled 02/23/15 #90  Please advise    KP

## 2015-03-18 ENCOUNTER — Other Ambulatory Visit: Payer: Self-pay | Admitting: Family Medicine

## 2015-03-19 NOTE — Telephone Encounter (Signed)
Last seen 02/12/15 and filled 01/12/15 #90  Please advise     KP

## 2015-04-07 ENCOUNTER — Other Ambulatory Visit: Payer: Self-pay | Admitting: Family Medicine

## 2015-04-16 ENCOUNTER — Ambulatory Visit (HOSPITAL_COMMUNITY)
Admission: RE | Admit: 2015-04-16 | Discharge: 2015-04-16 | Disposition: A | Discharge status: Home / Self Care | Payer: Medicare Other | Source: Ambulatory Visit | Attending: Cardiology | Admitting: Cardiology

## 2015-04-16 VITALS — BP 118/64 | HR 90 | Resp 18 | Wt 182.0 lb

## 2015-04-16 DIAGNOSIS — I5032 Chronic diastolic (congestive) heart failure: Secondary | ICD-10-CM | POA: Insufficient documentation

## 2015-04-16 DIAGNOSIS — N189 Chronic kidney disease, unspecified: Secondary | ICD-10-CM | POA: Insufficient documentation

## 2015-04-16 DIAGNOSIS — I071 Rheumatic tricuspid insufficiency: Secondary | ICD-10-CM | POA: Diagnosis not present

## 2015-04-16 DIAGNOSIS — I252 Old myocardial infarction: Secondary | ICD-10-CM | POA: Diagnosis not present

## 2015-04-16 DIAGNOSIS — Z79899 Other long term (current) drug therapy: Secondary | ICD-10-CM | POA: Insufficient documentation

## 2015-04-16 DIAGNOSIS — N319 Neuromuscular dysfunction of bladder, unspecified: Secondary | ICD-10-CM | POA: Insufficient documentation

## 2015-04-16 DIAGNOSIS — I5042 Chronic combined systolic (congestive) and diastolic (congestive) heart failure: Secondary | ICD-10-CM | POA: Diagnosis not present

## 2015-04-16 DIAGNOSIS — I2510 Atherosclerotic heart disease of native coronary artery without angina pectoris: Secondary | ICD-10-CM | POA: Insufficient documentation

## 2015-04-16 DIAGNOSIS — Z8249 Family history of ischemic heart disease and other diseases of the circulatory system: Secondary | ICD-10-CM | POA: Diagnosis not present

## 2015-04-16 DIAGNOSIS — R188 Other ascites: Secondary | ICD-10-CM | POA: Diagnosis not present

## 2015-04-16 DIAGNOSIS — I130 Hypertensive heart and chronic kidney disease with heart failure and stage 1 through stage 4 chronic kidney disease, or unspecified chronic kidney disease: Secondary | ICD-10-CM | POA: Insufficient documentation

## 2015-04-16 DIAGNOSIS — I50810 Right heart failure, unspecified: Secondary | ICD-10-CM

## 2015-04-16 DIAGNOSIS — D631 Anemia in chronic kidney disease: Secondary | ICD-10-CM | POA: Insufficient documentation

## 2015-04-16 DIAGNOSIS — I482 Chronic atrial fibrillation: Secondary | ICD-10-CM | POA: Insufficient documentation

## 2015-04-16 DIAGNOSIS — E785 Hyperlipidemia, unspecified: Secondary | ICD-10-CM | POA: Diagnosis not present

## 2015-04-16 DIAGNOSIS — I4821 Permanent atrial fibrillation: Secondary | ICD-10-CM

## 2015-04-16 DIAGNOSIS — Z951 Presence of aortocoronary bypass graft: Secondary | ICD-10-CM | POA: Insufficient documentation

## 2015-04-16 DIAGNOSIS — Z7901 Long term (current) use of anticoagulants: Secondary | ICD-10-CM | POA: Diagnosis not present

## 2015-04-16 DIAGNOSIS — I509 Heart failure, unspecified: Secondary | ICD-10-CM

## 2015-04-16 DIAGNOSIS — K862 Cyst of pancreas: Secondary | ICD-10-CM | POA: Insufficient documentation

## 2015-04-16 LAB — BASIC METABOLIC PANEL
Anion gap: 13 (ref 5–15)
BUN: 48 mg/dL — AB (ref 6–20)
CHLORIDE: 98 mmol/L — AB (ref 101–111)
CO2: 28 mmol/L (ref 22–32)
Calcium: 10 mg/dL (ref 8.9–10.3)
Creatinine, Ser: 2.05 mg/dL — ABNORMAL HIGH (ref 0.61–1.24)
GFR, EST AFRICAN AMERICAN: 33 mL/min — AB (ref >60)
GFR, EST NON AFRICAN AMERICAN: 28 mL/min — AB (ref >60)
Glucose, Bld: 96 mg/dL (ref 65–99)
POTASSIUM: 4.5 mmol/L (ref 3.5–5.1)
SODIUM: 139 mmol/L (ref 135–145)

## 2015-04-16 LAB — LIPID PANEL
Cholesterol: 86 mg/dL (ref 0–200)
HDL: 36 mg/dL — ABNORMAL LOW (ref >40)
LDL CALC: 39 mg/dL (ref 0–99)
Total CHOL/HDL Ratio: 2.4 RATIO
Triglycerides: 56 mg/dL (ref <150)
VLDL: 11 mg/dL (ref 0–40)

## 2015-04-16 LAB — CBC
HCT: 44.8 % (ref 39.0–52.0)
HEMOGLOBIN: 14.4 g/dL (ref 13.0–17.0)
MCH: 28.7 pg (ref 26.0–34.0)
MCHC: 32.1 g/dL (ref 30.0–36.0)
MCV: 89.4 fL (ref 78.0–100.0)
PLATELETS: 146 10*3/uL — AB (ref 150–400)
RBC: 5.01 MIL/uL (ref 4.22–5.81)
RDW: 13.4 % (ref 11.5–15.5)
WBC: 4.8 10*3/uL (ref 4.0–10.5)

## 2015-04-16 MED ORDER — TORSEMIDE 20 MG PO TABS: 40.0000 mg | ORAL_TABLET | Freq: Two times a day (BID) | ORAL | Status: DC

## 2015-04-16 NOTE — Patient Instructions (Signed)
Take Torsemide 40mg  twice daily.  Routine lab work today. Will notify you of abnormal results  Follow up with Dr.McLean in 4 months

## 2015-04-18 NOTE — Progress Notes (Signed)
Patient ID: Gary Lope Sr., male   DOB: 1929/10/10, 82 y.o.   MRN: [id number] PCP: Gary [name] Primary Cardiologist: Gary. Stanford [name]  82 yo with history of CAD s/p CABG in 1995, chronic atrial fibrillation, CKD, and chronic diastolic CHF with prominent RV failure presents for CHF clinic evaluation.  Patient has struggled with right-sided failure. He was admitted in 5/16 with abdominal distention, volume overload, and elevated creatinine as high as 6.  Echo showed normal LV EF but RV was moderately dilated with moderate-severe TR, ?Ebsteins anomaly anatomy.  He had a paracentesis for ascites.  V/Q scan was negative for PE. He refused RHC while in the hospital.  He was diuresed and creatinine improved.  Gary [name] returns for followup.  He can walk on flat ground without dyspnea.  He has a chair lift to get up the stairs at home but can climb a short flight of steps without problems.  He has been sleeping in a recliner for 3 years.  No PND.  His hobby is woodworking, which he is able to do without problems.  No chest pain.  +low back pain.  His heart rate is controlled without nodal blockers.  Weight is stable. No melena/BRBPR (on warfarin).   He has a chronic foley.    Labs (5/16): K 3.6, creatinine 6.15 => 2.47, LFTs normal, LDL 23, HCT 25.2 Labs (07/28/14): K 4.6, Creatinine 2.62, BNP 463 Labs (6/16): creatinine 2.23 => 2.01, K 4.4, BNP 227 Labs (8/16): LFTs normal Labs (12/16): K 4, creatinine 2.11, HCT 40.8  PMH:  1. CAD: MI 1983, CABG 1995.   2. Chronic diastolic failure with prominent RV failure: Echo (5/16) with EF 55-60%, moderate LV hypertrophy, moderately dilated RV, massive biatrial enlargement, moderate-severe TR with some question of Ebstein's anomaly, PA systolic pressure 37 mmHg.  Patient has had ascites likely related to RV failure with h/o paracentesis.  3. CKD 4. Permanent atrial fibrillation 5. LBBB 6. Hyperlipidemia 7. HTN 8. Complex pancreatic cyst: Followed by GI.  9. Neurogenic  bladder: Chronic foley.  10. Anemia of renal disease.  11. Cholecystectomy 12. Appendectomy  SH: Married, nonsmoker, 4 kids, lives with son.   FH: CAD  ROS: All systems reviewed and negative except as per HPI.   Current Outpatient Prescriptions  Medication Sig Dispense Refill  . acetaminophen (TYLENOL) 500 MG tablet Take 1,000 mg by mouth 3 (three) times daily.    Marland Kitchen apixaban (ELIQUIS) 2.5 MG TABS tablet Take 1 tablet (2.5 mg total) by mouth 2 (two) times daily. 180 tablet 3  . atorvastatin (LIPITOR) 20 MG tablet TAKE 1 TABLET BY MOUTH EVERY DAY 30 tablet 2  . clonazePAM (KLONOPIN) 1 MG tablet Take 1 mg by mouth 2 (two) times daily.    . ferrous sulfate 325 (65 FE) MG tablet Take 325 mg by mouth daily with breakfast.     . folic acid (FOLVITE) 1 MG tablet Take 1 mg by mouth daily.      . Glucos-Chondroit-Hyaluron-MSM (GLUCOSAMINE CHONDROITIN JOINT PO) Take 1 tablet by mouth 2 (two) times daily.    . Meth-Hyo-M Bl-Na Phos-Ph Sal (URIBEL) 118 MG CAPS TAKE 1 TO 4 CAPSULE BY MOUTH EVERY DAY AS DIRECTED 120 capsule 5  . mirtazapine (REMERON) 30 MG tablet Take 1 tablet (30 mg total) by mouth at bedtime. 90 tablet 1  . Multiple Vitamin (MULTIVITAMIN) capsule Take 1 capsule by mouth daily.      Marland Kitchen neomycin-bacitracin-polymyxin (NEOSPORIN) ointment Apply 1 application topically daily as needed  for wound care.     . nystatin-triamcinolone ointment (MYCOLOG) APPLY EXTERNALLY TO THE AFFECTED AREA TWICE DAILY 60 g 0  . ondansetron (ZOFRAN) 8 MG tablet TAKE 1 TABLET BY MOUTH EVERY 8 HOURS AS NEEDED FOR NAUSEA AND VOMITING 90 tablet 0  . torsemide (DEMADEX) 20 MG tablet Take 2 tablets (40 mg total) by mouth 2 (two) times daily. 120 tablet 3  . traMADol (ULTRAM) 50 MG tablet TAKE 1 TABLET BY MOUTH EVERY 8 HOURS AS NEEDED 90 tablet 0  . triamcinolone cream (KENALOG) 0.1 % APPLY TO THE AFFECTED AREA TWICE DAILY AS NEEDED; DO NOT USE ON FACE, GROIN, OR UNDERARMS 454 g 0  . lidocaine (XYLOCAINE) 2 % jelly  Place 1 application into the urethra daily as needed (pain).    . naftifine (NAFTIN) 1 % cream APPLY ONE APPLICATION TO THE AFFECTED AREA DAILY 60 g 2   No current facility-administered medications for this encounter.   BP 118/64 mmHg  Pulse 90  Resp 18  Wt 182 lb (82.555 kg)  SpO2 100% General: NAD Neck: JVP 7 cm, no thyromegaly or thyroid nodule.  Lungs: Clear to auscultation bilaterally with normal respiratory effort. CV: Nondisplaced PMI.  Heart irregular S1/S2, no S3/S4, 1/6 HSM LLSB.  Trace ankle edema.  No carotid bruit.  Normal pedal pulses.  Abdomen: Soft, nontender, no hepatosplenomegaly, mild distention.  Skin: Intact without lesions or rashes.  Neurologic: Alert and oriented x 3.  Psych: Normal affect. Extremities: No clubbing or cyanosis.  HEENT: Normal.   Assessment/Plan: 1. Atrial fibrillation: Permanent.  Markedly dilated atria on echo, unlikely to stay in NSR if DCCV were to be attempted. He is on warfarin.  He has not been on any nodal blockers and HR is reasonably controlled.  CBC today.  2. Chronic diastolic CHF with prominent RV failure: On exam, he is not volume overloaded.  NYHA class II symptoms. Much improved compared to prior to hospitalization.  - Continue torsemide 40 mg bid. BMET today.   3. Tricuspid regurgitation: Moderate to severe TR.  There was question of possible Ebstein's anomaly on echo. Regardless, will be managed medically. Need to keep decongested with torsemide.  4. Ascites: Suspect this has been due hepatic congestion from RV failure.   5. CKD: Creatinine stable to improved recently.  Suspect cardiorenal component.  He is seeing Gary [name] for nephrology.  HD would be difficult with RV failure.  6. CAD: s/p CABG.  No chest pain.  Continue statin (check lipids today).  No ASA with use of warfarin and stable CAD.    Followup in 4 months.   Gary [name] 04/18/2015

## 2015-04-22 ENCOUNTER — Encounter (HOSPITAL_COMMUNITY): Payer: Self-pay | Admitting: *Deleted

## 2015-04-23 ENCOUNTER — Telehealth: Payer: Self-pay | Admitting: *Deleted

## 2015-04-23 NOTE — Telephone Encounter (Signed)
Received Home Health Certification and Plan of Care; forwarded to provider/SLS 02/24

## 2015-04-28 ENCOUNTER — Other Ambulatory Visit: Payer: Self-pay | Admitting: Family Medicine

## 2015-05-06 ENCOUNTER — Ambulatory Visit: Payer: Self-pay | Admitting: Pharmacist

## 2015-05-06 ENCOUNTER — Other Ambulatory Visit: Payer: Self-pay | Admitting: Family Medicine

## 2015-05-06 DIAGNOSIS — I4821 Permanent atrial fibrillation: Secondary | ICD-10-CM

## 2015-05-06 DIAGNOSIS — Z7901 Long term (current) use of anticoagulants: Secondary | ICD-10-CM

## 2015-05-25 ENCOUNTER — Other Ambulatory Visit: Payer: Self-pay | Admitting: Family Medicine

## 2015-05-25 MED ORDER — TRAMADOL HCL 50 MG PO TABS: 50.0000 mg | ORAL_TABLET | Freq: Three times a day (TID) | ORAL | Status: DC | PRN

## 2015-05-25 NOTE — Telephone Encounter (Signed)
Name from pharmacy:  In chart as:  TRAMADOL 50MG  TABLETS traMADol (ULTRAM) 50 MG tablet     Sig: TAKE 1 TABLET BY MOUTH EVERY 8 HOURS AS NEEDED    Dispense: 90 tablet   Refills: 0   Start: 05/25/2015   Class: Normal    Requested on: 05/25/2015    Originally ordered on: 07/28/2014 03/19/2015      No previous CSC or UDS on file.  Rx printed and forwarded to PCP for signature. Pt has f/u with PCP on 08/13/15.

## 2015-06-14 ENCOUNTER — Telehealth: Payer: Self-pay | Admitting: *Deleted

## 2015-06-14 NOTE — Telephone Encounter (Signed)
Forwarded to Dr. Lowne-Chase. JG//CMA 

## 2015-06-17 NOTE — Telephone Encounter (Signed)
Forms faxed to [facility] successfully. Sent for scanning. JG//CMA

## 2015-06-23 ENCOUNTER — Telehealth: Payer: Self-pay | Admitting: Family Medicine

## 2015-06-23 MED ORDER — URIBEL 118 MG PO CAPS: ORAL_CAPSULE | ORAL | Status: DC

## 2015-06-23 NOTE — Telephone Encounter (Signed)
Rx faxed.    KP 

## 2015-06-23 NOTE — Addendum Note (Signed)
Addended by: Ewing Schlein on: 06/23/2015 01:02 PM   Modules accepted: Orders

## 2015-06-23 NOTE — Telephone Encounter (Signed)
Caller name: Self  Can be reached: [phone] Pharmacy:  Reason for call: Request that his rx for Meth-Hyo-M Bl-Na Phos-Ph Sal (URIBEL) 118 MG CAPS [id number] be changed back to 360 capsules instead of the 120 capsules that he got in the last rx. States that the price is the same and he prefer to get the larger amount for his money.

## 2015-06-23 NOTE — Telephone Encounter (Signed)
Please advise if it is ok to adjust the quantity in Dr.Lowne's absence.     KP

## 2015-06-23 NOTE — Telephone Encounter (Signed)
Ok

## 2015-06-24 ENCOUNTER — Other Ambulatory Visit: Payer: Self-pay | Admitting: Family Medicine

## 2015-07-07 ENCOUNTER — Other Ambulatory Visit: Payer: Self-pay | Admitting: Family Medicine

## 2015-07-07 NOTE — Telephone Encounter (Signed)
Requesting Tramadol 50mg -Take 1 tablet by mouth every 8 hours as needed. Last refill:05/25/15;#90,0 Last OV:02/12/15 Please advise.//AB/CMA

## 2015-08-06 ENCOUNTER — Telehealth: Payer: Self-pay | Admitting: Family Medicine

## 2015-08-06 MED ORDER — CIPROFLOXACIN HCL 250 MG PO TABS: 250.0000 mg | ORAL_TABLET | Freq: Two times a day (BID) | ORAL | Status: DC

## 2015-08-06 NOTE — Telephone Encounter (Signed)
Dr. Etter Sjogren gave verbal order for UA and culture.  Called [facility] and left a voice message for call back.  Awaiting call back.

## 2015-08-06 NOTE — Telephone Encounter (Signed)
Sent skype to [employer] and she will phone in order.

## 2015-08-06 NOTE — Telephone Encounter (Signed)
Caller name: Marcie Bal Relationship to patient: wife Can be reached: [phone]   Reason for call: Pt has [facility] nurse coming out today at 2:30pm. Pt feels he may have UTI and wants to have [facility] take a sample to confirm. She is requesting we send order to [facility] and call to notify her. She also states pt suffers from constipation. Please call and let her know if we can do this.

## 2015-08-06 NOTE — Telephone Encounter (Signed)
Rx sent to pharmacy.  Wife notified and made aware.

## 2015-08-06 NOTE — Telephone Encounter (Signed)
cipro 250 mg bid x 3 days

## 2015-08-06 NOTE — Telephone Encounter (Signed)
Called [facility] at [phone] and spoke to [place], [place].  Verbal order given for a PRN visit with Angie, LPN to complete UA and culture.  Estill Bamberg also says an Therapist, sports will be going out the house to do a re-certification and catheter change.  Wife aware.  Wife would like an order for an antibiotic to cover patient just in case results do not come back over the weekend.  She states that she does not want her husband to suffer over the weekend.  Per wife, pt is experiencing constipation (last BM: yesterday), chills, nausea, decrease appetite, and unsteadiness on his feet.  No complaints of fever, lower abdominal pain, blood in urine or decrease urine output.    Please advise.

## 2015-08-13 ENCOUNTER — Encounter: Payer: Self-pay | Admitting: Family Medicine

## 2015-08-13 ENCOUNTER — Ambulatory Visit (INDEPENDENT_AMBULATORY_CARE_PROVIDER_SITE_OTHER): Payer: Medicare Other | Admitting: Family Medicine

## 2015-08-13 VITALS — BP 107/67 | HR 75 | Temp 98.3°F | Ht 72.0 in | Wt 186.8 lb

## 2015-08-13 DIAGNOSIS — G894 Chronic pain syndrome: Secondary | ICD-10-CM

## 2015-08-13 DIAGNOSIS — R319 Hematuria, unspecified: Secondary | ICD-10-CM | POA: Diagnosis not present

## 2015-08-13 DIAGNOSIS — I10 Essential (primary) hypertension: Secondary | ICD-10-CM

## 2015-08-13 DIAGNOSIS — E785 Hyperlipidemia, unspecified: Secondary | ICD-10-CM

## 2015-08-13 LAB — CBC WITH DIFFERENTIAL/PLATELET
BASOS ABS: 0 10*3/uL (ref 0.0–0.1)
Basophils Relative: 0.3 % (ref 0.0–3.0)
EOS ABS: 0.1 10*3/uL (ref 0.0–0.7)
Eosinophils Relative: 2.1 % (ref 0.0–5.0)
HEMATOCRIT: 39.6 % (ref 39.0–52.0)
Hemoglobin: 13.1 g/dL (ref 13.0–17.0)
LYMPHS PCT: 20.1 % (ref 12.0–46.0)
Lymphs Abs: 1 10*3/uL (ref 0.7–4.0)
MCHC: 33.1 g/dL (ref 30.0–36.0)
MCV: 86.9 fl (ref 78.0–100.0)
MONOS PCT: 10.4 % (ref 3.0–12.0)
Monocytes Absolute: 0.5 10*3/uL (ref 0.1–1.0)
NEUTROS ABS: 3.2 10*3/uL (ref 1.4–7.7)
Neutrophils Relative %: 67.1 % (ref 43.0–77.0)
PLATELETS: 277 10*3/uL (ref 150.0–400.0)
RBC: 4.56 Mil/uL (ref 4.22–5.81)
RDW: 13.8 % (ref 11.5–15.5)
WBC: 4.8 10*3/uL (ref 4.0–10.5)

## 2015-08-13 LAB — LIPID PANEL
CHOL/HDL RATIO: 3
Cholesterol: 80 mg/dL (ref 0–200)
HDL: 29.9 mg/dL — ABNORMAL LOW (ref >39.00)
LDL CALC: 37 mg/dL (ref 0–99)
NONHDL: 49.79
TRIGLYCERIDES: 65 mg/dL (ref 0.0–149.0)
VLDL: 13 mg/dL (ref 0.0–40.0)

## 2015-08-13 LAB — COMPREHENSIVE METABOLIC PANEL
ALK PHOS: 78 U/L (ref 39–117)
ALT: 20 U/L (ref 0–53)
AST: 21 U/L (ref 0–37)
Albumin: 3.9 g/dL (ref 3.5–5.2)
BILIRUBIN TOTAL: 0.9 mg/dL (ref 0.2–1.2)
BUN: 34 mg/dL — ABNORMAL HIGH (ref 6–23)
CALCIUM: 10.1 mg/dL (ref 8.4–10.5)
CO2: 35 meq/L — AB (ref 19–32)
CREATININE: 1.61 mg/dL — AB (ref 0.40–1.50)
Chloride: 98 mEq/L (ref 96–112)
GFR: 43.63 mL/min — AB (ref >60.00)
GLUCOSE: 97 mg/dL (ref 70–99)
POTASSIUM: 4.5 meq/L (ref 3.5–5.1)
Sodium: 137 mEq/L (ref 135–145)
TOTAL PROTEIN: 7.3 g/dL (ref 6.0–8.3)

## 2015-08-13 LAB — POCT URINALYSIS DIPSTICK
BILIRUBIN UA: NEGATIVE
GLUCOSE UA: NEGATIVE
KETONES UA: NEGATIVE
NITRITE UA: POSITIVE
PH UA: 6.5
Protein, UA: NEGATIVE
SPEC GRAV UA: 1.015
Urobilinogen, UA: 0.2

## 2015-08-13 MED ORDER — TRAMADOL HCL 50 MG PO TABS: 50.0000 mg | ORAL_TABLET | Freq: Three times a day (TID) | ORAL | Status: DC | PRN

## 2015-08-13 NOTE — Patient Instructions (Signed)

## 2015-08-13 NOTE — Progress Notes (Signed)
Pre visit review using our clinic review tool, if applicable. No additional management support is needed unless otherwise documented below in the visit note. 

## 2015-08-14 MED ORDER — ATORVASTATIN CALCIUM 20 MG PO TABS: 20.0000 mg | ORAL_TABLET | Freq: Every day | ORAL | Status: DC

## 2015-08-14 NOTE — Progress Notes (Signed)
Patient ID: Gary Lope Sr., male    DOB: 07-27-1929  Age: 82 y.o. MRN: [id number]    Subjective:  Subjective HPI Jennings presents for f/u, chiolesterol and htn and uti.  Son is present.  He has no symptoms from UTI  No complaints.     Review of Systems  Constitutional: Negative for diaphoresis, appetite change, fatigue and unexpected weight change.  Eyes: Negative for pain, redness and visual disturbance.  Respiratory: Negative for cough, chest tightness, shortness of breath and wheezing.   Cardiovascular: Negative for chest pain, palpitations and leg swelling.  Endocrine: Negative for cold intolerance, heat intolerance, polydipsia, polyphagia and polyuria.  Genitourinary: Negative for dysuria, frequency and difficulty urinating.  Neurological: Negative for dizziness, light-headedness, numbness and headaches.    History Past Medical History  Diagnosis Date  . Neurogenic bladder     has had bladder catheter changes at the urology office every 12 weeks  . Anemia   . Hyperlipidemia   . Hypertension   . Heart attack ([place])     1983  . Chronic systolic heart failure (HCC)     a. prior EF 20-30%;  b. Echo 7/09: EF 35-40%, basal inferoposterior HK, mild AI, mild to moderate MR, moderate to marked LAE, mild to moderate TR, mildly increased PASP, moderate RAE;   c. Echo 01/2012:   mild LVH, EF 50%, Tr AI, mild MR, severe LAE, mod to severe RAE, mod to severe RVE, mod reduced RVSF, mod to severe TR, PASP 46  . Arrhythmia     afib  . Warfarin anticoagulation   . GERD (gastroesophageal reflux disease)   . Anxiety   . Renal failure   . CHF (congestive heart failure) ([place])   . Coronary artery disease     He has past surgical history that includes Appendectomy; Cholecystectomy; and Coronary artery bypass graft.   His family history includes Breast cancer in his sister; Cancer in his mother; Heart attack in his father.He reports that he has quit smoking. He has never used  smokeless tobacco. He reports that he does not drink alcohol or use illicit drugs.  Current Outpatient Prescriptions on File Prior to Visit  Medication Sig Dispense Refill  . acetaminophen (TYLENOL) 500 MG tablet Take 1,000 mg by mouth 3 (three) times daily.    Marland Kitchen apixaban (ELIQUIS) 2.5 MG TABS tablet Take 1 tablet (2.5 mg total) by mouth 2 (two) times daily. 180 tablet 3  . clonazePAM (KLONOPIN) 1 MG tablet Take 1 mg by mouth 2 (two) times daily.    . ferrous sulfate 325 (65 FE) MG tablet Take 325 mg by mouth daily with breakfast.     . folic acid (FOLVITE) 1 MG tablet Take 1 mg by mouth daily.      . Glucos-Chondroit-Hyaluron-MSM (GLUCOSAMINE CHONDROITIN JOINT PO) Take 1 tablet by mouth 2 (two) times daily.    Marland Kitchen lidocaine (XYLOCAINE) 2 % jelly Place 1 application into the urethra daily as needed (pain).    . Meth-Hyo-M Bl-Na Phos-Ph Sal (URIBEL) 118 MG CAPS TAKE 1 TO 4 CAPSULE BY MOUTH EVERY DAY AS DIRECTED 360 capsule 5  . mirtazapine (REMERON) 30 MG tablet TAKE 1 TABLET(30 MG) BY MOUTH AT BEDTIME 90 tablet 0  . Multiple Vitamin (MULTIVITAMIN) capsule Take 1 capsule by mouth daily.      . naftifine (NAFTIN) 1 % cream APPLY ONE APPLICATION TO THE AFFECTED AREA DAILY 60 g 2  . neomycin-bacitracin-polymyxin (NEOSPORIN) ointment Apply 1 application topically daily  as needed for wound care.     . nystatin-triamcinolone ointment (MYCOLOG) APPLY EXTERNALLY TO THE AFFECTED AREA TWICE DAILY 60 g 0  . ondansetron (ZOFRAN) 8 MG tablet TAKE 1 TABLET BY MOUTH EVERY 8 HOURS AS NEEDED FOR NAUSEA AND VOMITING 90 tablet 0  . torsemide (DEMADEX) 20 MG tablet Take 2 tablets (40 mg total) by mouth 2 (two) times daily. 120 tablet 3  . triamcinolone cream (KENALOG) 0.1 % APPLY TO THE AFFECTED AREA TWICE DAILY AS NEEDED; DO NOT USE ON FACE, GROIN, OR UNDERARMS 454 g 0  . ciprofloxacin (CIPRO) 250 MG tablet Take 1 tablet (250 mg total) by mouth 2 (two) times daily. (Patient not taking: Reported on 08/13/2015) 6 tablet  0   No current facility-administered medications on file prior to visit.     Objective:  Objective Physical Exam  Constitutional: He is oriented to person, place, and time. Vital signs are normal. He appears well-developed and well-nourished. He is sleeping.  HENT:  Head: Normocephalic and atraumatic.  Mouth/Throat: Oropharynx is clear and moist.  Eyes: EOM are normal. Pupils are equal, round, and reactive to light.  Neck: Normal range of motion. Neck supple. No thyromegaly present.  Cardiovascular: Normal rate and regular rhythm.   No murmur heard. Pulmonary/Chest: Effort normal and breath sounds normal. No respiratory distress. He has no wheezes. He has no rales. He exhibits no tenderness.  Musculoskeletal: He exhibits no edema or tenderness.  Neurological: He is alert and oriented to person, place, and time.  Skin: Skin is warm and dry.  Psychiatric: He has a normal mood and affect. His behavior is normal. Judgment and thought content normal.  Nursing note and vitals reviewed.  BP 107/67 mmHg  Pulse 75  Temp(Src) 98.3 F (36.8 C) (Oral)  Ht 6' (1.829 m)  Wt 186 lb 12.8 oz (84.732 kg)  BMI 25.33 kg/m2  SpO2 97% Wt Readings from Last 3 Encounters:  08/13/15 186 lb 12.8 oz (84.732 kg)  04/16/15 182 lb (82.555 kg)  02/12/15 182 lb (82.555 kg)     Lab Results  Component Value Date   WBC 4.8 08/13/2015   HGB 13.1 08/13/2015   HCT 39.6 08/13/2015   PLT 277.0 08/13/2015   GLUCOSE 97 08/13/2015   CHOL 80 08/13/2015   TRIG 65.0 08/13/2015   HDL 29.90* 08/13/2015   LDLCALC 37 08/13/2015   ALT 20 08/13/2015   AST 21 08/13/2015   NA 137 08/13/2015   K 4.5 08/13/2015   CL 98 08/13/2015   CREATININE 1.61* 08/13/2015   BUN 34* 08/13/2015   CO2 35* 08/13/2015   TSH 2.428 07/14/2014   PSA 13.12* 10/20/2011   INR 2.1 12/24/2014    No results found.   Assessment & Plan:  Plan I have changed Mr. Wojtas traMADol and atorvastatin. I am also having him maintain his  multivitamin, folic acid, ferrous sulfate, acetaminophen, lidocaine, Glucos-Chondroit-Hyaluron-MSM (GLUCOSAMINE CHONDROITIN JOINT PO), neomycin-bacitracin-polymyxin, triamcinolone cream, nystatin-triamcinolone ointment, apixaban, naftifine, clonazePAM, torsemide, ondansetron, mirtazapine, URIBEL, and ciprofloxacin.  Meds ordered this encounter  Medications  . traMADol (ULTRAM) 50 MG tablet    Sig: Take 1 tablet (50 mg total) by mouth every 8 (eight) hours as needed.    Dispense:  90 tablet    Refill:  1  . atorvastatin (LIPITOR) 20 MG tablet    Sig: Take 1 tablet (20 mg total) by mouth daily.    Dispense:  30 tablet    Refill:  5    Problem List Items  Addressed This Visit    Essential hypertension-now hypotensive    Off meds  Due to weight loss       Relevant Medications   atorvastatin (LIPITOR) 20 MG tablet   Other Relevant Orders   Comprehensive metabolic panel (Completed)   POCT urinalysis dipstick (Completed)   Lipid panel (Completed)   CBC with Differential/Platelet (Completed)   Hyperlipidemia   Relevant Medications   atorvastatin (LIPITOR) 20 MG tablet   Other Relevant Orders   Comprehensive metabolic panel (Completed)   POCT urinalysis dipstick (Completed)   Lipid panel (Completed)   CBC with Differential/Platelet (Completed)    Other Visit Diagnoses    Chronic pain syndrome    -  Primary    Relevant Medications    traMADol (ULTRAM) 50 MG tablet    Other Relevant Orders    Comprehensive metabolic panel (Completed)    POCT urinalysis dipstick (Completed)    Lipid panel (Completed)    CBC with Differential/Platelet (Completed)    Hematuria        Relevant Orders    Urine culture       Follow-up: Return if symptoms worsen or fail to improve, for hypertension, hyperlipidemia.  Ann Held, DO

## 2015-08-14 NOTE — Assessment & Plan Note (Signed)
Off meds  Due to weight loss

## 2015-08-15 LAB — URINE CULTURE: Colony Count: >=100000

## 2015-08-18 ENCOUNTER — Telehealth: Payer: Self-pay | Admitting: *Deleted

## 2015-08-18 MED ORDER — FOSFOMYCIN TROMETHAMINE 3 G PO PACK: 3.0000 g | PACK | Freq: Once | ORAL | Status: DC

## 2015-08-18 NOTE — Telephone Encounter (Signed)
-----   Message from Ann Held, DO sent at 08/18/2015 10:19 AM EDT ----- + uti-- resistant to cipro  Monurol 3 g po x1

## 2015-08-18 NOTE — Telephone Encounter (Signed)
Notified Marcie Bal and she voices understanding.

## 2015-08-20 ENCOUNTER — Telehealth: Payer: Self-pay | Admitting: *Deleted

## 2015-08-20 ENCOUNTER — Ambulatory Visit (HOSPITAL_COMMUNITY)
Admission: RE | Admit: 2015-08-20 | Discharge: 2015-08-20 | Disposition: A | Discharge status: Home / Self Care | Payer: Medicare Other | Source: Ambulatory Visit | Attending: Cardiology | Admitting: Cardiology

## 2015-08-20 VITALS — BP 140/62 | HR 88 | Wt 187.1 lb

## 2015-08-20 DIAGNOSIS — I50810 Right heart failure, unspecified: Secondary | ICD-10-CM

## 2015-08-20 DIAGNOSIS — N189 Chronic kidney disease, unspecified: Secondary | ICD-10-CM | POA: Diagnosis not present

## 2015-08-20 DIAGNOSIS — Z951 Presence of aortocoronary bypass graft: Secondary | ICD-10-CM | POA: Diagnosis not present

## 2015-08-20 DIAGNOSIS — I252 Old myocardial infarction: Secondary | ICD-10-CM | POA: Insufficient documentation

## 2015-08-20 DIAGNOSIS — I2510 Atherosclerotic heart disease of native coronary artery without angina pectoris: Secondary | ICD-10-CM | POA: Insufficient documentation

## 2015-08-20 DIAGNOSIS — I509 Heart failure, unspecified: Secondary | ICD-10-CM | POA: Diagnosis not present

## 2015-08-20 DIAGNOSIS — K862 Cyst of pancreas: Secondary | ICD-10-CM | POA: Diagnosis not present

## 2015-08-20 DIAGNOSIS — E785 Hyperlipidemia, unspecified: Secondary | ICD-10-CM | POA: Diagnosis not present

## 2015-08-20 DIAGNOSIS — Z8249 Family history of ischemic heart disease and other diseases of the circulatory system: Secondary | ICD-10-CM | POA: Insufficient documentation

## 2015-08-20 DIAGNOSIS — Z9049 Acquired absence of other specified parts of digestive tract: Secondary | ICD-10-CM | POA: Diagnosis not present

## 2015-08-20 DIAGNOSIS — I5032 Chronic diastolic (congestive) heart failure: Secondary | ICD-10-CM | POA: Insufficient documentation

## 2015-08-20 DIAGNOSIS — I447 Left bundle-branch block, unspecified: Secondary | ICD-10-CM | POA: Insufficient documentation

## 2015-08-20 DIAGNOSIS — I130 Hypertensive heart and chronic kidney disease with heart failure and stage 1 through stage 4 chronic kidney disease, or unspecified chronic kidney disease: Secondary | ICD-10-CM | POA: Insufficient documentation

## 2015-08-20 DIAGNOSIS — N318 Other neuromuscular dysfunction of bladder: Secondary | ICD-10-CM | POA: Diagnosis not present

## 2015-08-20 DIAGNOSIS — I071 Rheumatic tricuspid insufficiency: Secondary | ICD-10-CM | POA: Diagnosis not present

## 2015-08-20 DIAGNOSIS — Z7901 Long term (current) use of anticoagulants: Secondary | ICD-10-CM | POA: Insufficient documentation

## 2015-08-20 DIAGNOSIS — R188 Other ascites: Secondary | ICD-10-CM | POA: Diagnosis not present

## 2015-08-20 DIAGNOSIS — I5042 Chronic combined systolic (congestive) and diastolic (congestive) heart failure: Secondary | ICD-10-CM | POA: Diagnosis not present

## 2015-08-20 DIAGNOSIS — D631 Anemia in chronic kidney disease: Secondary | ICD-10-CM | POA: Insufficient documentation

## 2015-08-20 DIAGNOSIS — I482 Chronic atrial fibrillation: Secondary | ICD-10-CM | POA: Insufficient documentation

## 2015-08-20 NOTE — Telephone Encounter (Signed)
Forwarded to Dr. Lowne-Chase. JG//CMA 

## 2015-08-20 NOTE — Progress Notes (Signed)
Advanced Heart Failure Medication Review by a Pharmacist  Does the patient  feel that his/her medications are working for him/her?  yes  Has the patient been experiencing any side effects to the medications prescribed?  no  Does the patient measure his/her own blood pressure or blood glucose at home?  Not assessed  Does the patient have any problems obtaining medications due to transportation or finances?   no  Understanding of regimen: good Understanding of indications: good Potential of compliance: good Patient understands to avoid NSAIDs. Patient understands to avoid decongestants.  Pharmacist comments: Gary [name] is a pleasant 31 yom presenting to clinic for a follow-up appointment. He denies any medication-related side effects at this time. He also denies any swelling, dizziness, or SOB. He has a printed medication list that is very detailed and has a good understanding of his medication regimen. He did not have any medication-related questions or concerns at this time.   Gary [name], PharmD Pharmacy Resident  Pager: [phone] 08/20/2015 11:29 AM   Time with patient: 10 min  Preparation and documentation time: 2 min  Total time: 12 min

## 2015-08-20 NOTE — Patient Instructions (Signed)
Take extra torsemide for weight gain of 3 lbs overnight or 4 lbs in 1 week.  Return in 2 weeks for echo.  Follow up 6 months with Dr. Aundra Dubin.  Do the following things EVERYDAY: 1) Weigh yourself in the morning before breakfast. Write it down and keep it in a log. 2) Take your medicines as prescribed 3) Eat low salt foods-Limit salt (sodium) to 2000 mg per day.  4) Stay as active as you can everyday 5) Limit all fluids for the day to less than 2 liters

## 2015-08-22 NOTE — Progress Notes (Signed)
Patient ID: Gary Lope Sr., male   DOB: 1929-10-21, 82 y.o.   MRN: [id number] PCP: Dr Etter Sjogren Cardiology: Dr. Aundra Dubin  82 yo with history of CAD s/p CABG in 1995, chronic atrial fibrillation, CKD, and chronic diastolic CHF with prominent RV failure presents for CHF clinic evaluation.  Patient has struggled with right-sided failure. He was admitted in 5/16 with abdominal distention, volume overload, and elevated creatinine as high as 6.  Echo showed normal LV EF but RV was moderately dilated with moderate-severe TR, ?Ebsteins anomaly anatomy.  He had a paracentesis for ascites.  V/Q scan was negative for PE. He refused RHC while in the hospital.  He was diuresed and creatinine improved.  [name] returns for followup.  He can walk on flat ground without dyspnea.  He has a chair lift to get up the stairs at home but can climb a short flight of steps without problems.  He has been sleeping in a recliner for 3 years.  No PND.  His hobby is woodworking, which he is able to do without problems.  No chest pain.  Low back pain is more limiting to him than dyspnea.  His heart rate is controlled without nodal blockers.  Weight is up 5 lbs compared to last appointment, but his home weights have been stable. No melena/BRBPR (on warfarin).  SBP in 120s-130s range at home.  He has a chronic foley.    Labs (5/16): K 3.6, creatinine 6.15 => 2.47, LFTs normal, LDL 23, HCT 25.2 Labs (07/28/14): K 4.6, Creatinine 2.62, BNP 463 Labs (6/16): creatinine 2.23 => 2.01, K 4.4, BNP 227 Labs (8/16): LFTs normal Labs (12/16): K 4, creatinine 2.11, HCT 40.8 Labs (6/17): K 4.5, creatinine 1.6, LDL 37, HDL 30  PMH:  1. CAD: MI 1983, CABG 1995.   2. Chronic diastolic failure with prominent RV failure: Echo (5/16) with EF 55-60%, moderate LV hypertrophy, moderately dilated RV, massive biatrial enlargement, moderate-severe TR with some question of Ebstein's anomaly, PA systolic pressure 37 mmHg.  Patient has had ascites likely  related to RV failure with h/o paracentesis.  3. CKD 4. Permanent atrial fibrillation 5. LBBB 6. Hyperlipidemia 7. HTN 8. Complex pancreatic cyst: Followed by GI.  9. Neurogenic bladder: Chronic foley.  10. Anemia of renal disease.  11. Cholecystectomy 12. Appendectomy  SH: Married, nonsmoker, 4 kids, lives with son.   FH: CAD  ROS: All systems reviewed and negative except as per HPI.   Current Outpatient Prescriptions  Medication Sig Dispense Refill  . apixaban (ELIQUIS) 2.5 MG TABS tablet Take 1 tablet (2.5 mg total) by mouth 2 (two) times daily. 180 tablet 3  . atorvastatin (LIPITOR) 20 MG tablet Take 1 tablet (20 mg total) by mouth daily. 30 tablet 5  . ferrous sulfate 325 (65 FE) MG tablet Take 325 mg by mouth daily with breakfast.     . folic acid (FOLVITE) 1 MG tablet Take 1 mg by mouth daily.      . Glucos-Chondroit-Hyaluron-MSM (GLUCOSAMINE CHONDROITIN JOINT PO) Take 1 tablet by mouth 2 (two) times daily.    Marland Kitchen lidocaine (XYLOCAINE) 2 % jelly Place 1 application into the urethra daily as needed (pain).    . Meth-Hyo-M Bl-Na Phos-Ph Sal (URIBEL) 118 MG CAPS TAKE 1 TO 4 CAPSULE BY MOUTH EVERY DAY AS DIRECTED 360 capsule 5  . Multiple Vitamin (MULTIVITAMIN) capsule Take 1 capsule by mouth daily.      . naftifine (NAFTIN) 1 % cream APPLY ONE APPLICATION TO THE  AFFECTED AREA DAILY 60 g 2  . neomycin-bacitracin-polymyxin (NEOSPORIN) ointment Apply 1 application topically daily as needed for wound care.     . nystatin-triamcinolone ointment (MYCOLOG) APPLY EXTERNALLY TO THE AFFECTED AREA TWICE DAILY 60 g 0  . ondansetron (ZOFRAN) 8 MG tablet TAKE 1 TABLET BY MOUTH EVERY 8 HOURS AS NEEDED FOR NAUSEA AND VOMITING 90 tablet 0  . torsemide (DEMADEX) 20 MG tablet Take 2 tablets (40 mg total) by mouth 2 (two) times daily. 120 tablet 3  . traMADol (ULTRAM) 50 MG tablet Take 50 mg by mouth daily.    Marland Kitchen triamcinolone cream (KENALOG) 0.1 % APPLY TO THE AFFECTED AREA TWICE DAILY AS NEEDED; DO  NOT USE ON FACE, GROIN, OR UNDERARMS 454 g 0   No current facility-administered medications for this encounter.   BP 140/62 mmHg  Pulse 88  Wt 187 lb 1.9 oz (84.877 kg)  SpO2 98% General: NAD Neck: JVP 7 cm, no thyromegaly or thyroid nodule.  Lungs: Clear to auscultation bilaterally with normal respiratory effort. CV: Nondisplaced PMI.  Heart irregular S1/S2, no S3/S4, 1/6 HSM LLSB.  Trace ankle edema.  No carotid bruit.  Normal pedal pulses.  Abdomen: Soft, nontender, no hepatosplenomegaly, nondistended.  Skin: Intact without lesions or rashes.  Neurologic: Alert and oriented x 3.  Psych: Normal affect. Extremities: No clubbing or cyanosis.  HEENT: Normal.   Assessment/Plan: 1. Atrial fibrillation: Permanent.  Markedly dilated atria on echo, unlikely to stay in NSR if DCCV were to be attempted. He is on warfarin.  He has not been on any nodal blockers and HR is reasonably controlled.   2. Chronic diastolic CHF with prominent RV failure: On exam, he is not volume overloaded.  NYHA class II symptoms. Weight is up a bit on our scales but has been stable at home. - Continue torsemide 40 mg bid. If he gains 3 lbs overnight or 4 lbs in a week, he will take torsemide 60 mg bid x 2 days then back to 40 mg bid. - I will repeat echo to follow RV.    3. Tricuspid regurgitation: Moderate to severe TR.  There was question of possible Ebstein's anomaly on echo. Regardless, will be managed medically. Need to keep decongested with torsemide. As above, will repeat echo to follow RV.  4. Ascites: Suspect this has been due hepatic congestion from RV failure.  Now resolved.  5. CKD: Creatinine stable to improved recently.  Suspect cardiorenal component.  He is seeing Dr Joelyn Oms for nephrology.  HD would be difficult with RV failure.  6. CAD: s/p CABG.  No chest pain.  Continue statin, good lipids in 6/17.  No ASA with use of warfarin and stable CAD.    Followup in 6 months.   [name] 08/22/2015

## 2015-08-23 DIAGNOSIS — N319 Neuromuscular dysfunction of bladder, unspecified: Secondary | ICD-10-CM | POA: Diagnosis not present

## 2015-08-23 DIAGNOSIS — Z466 Encounter for fitting and adjustment of urinary device: Secondary | ICD-10-CM | POA: Diagnosis not present

## 2015-08-23 DIAGNOSIS — I130 Hypertensive heart and chronic kidney disease with heart failure and stage 1 through stage 4 chronic kidney disease, or unspecified chronic kidney disease: Secondary | ICD-10-CM | POA: Diagnosis not present

## 2015-08-23 DIAGNOSIS — N39498 Other specified urinary incontinence: Secondary | ICD-10-CM | POA: Diagnosis not present

## 2015-09-06 ENCOUNTER — Ambulatory Visit (HOSPITAL_COMMUNITY)
Admission: RE | Admit: 2015-09-06 | Discharge: 2015-09-06 | Disposition: A | Discharge status: Home / Self Care | Payer: Medicare Other | Source: Ambulatory Visit | Attending: Family Medicine | Admitting: Family Medicine

## 2015-09-06 DIAGNOSIS — I110 Hypertensive heart disease with heart failure: Secondary | ICD-10-CM | POA: Diagnosis not present

## 2015-09-06 DIAGNOSIS — I5042 Chronic combined systolic (congestive) and diastolic (congestive) heart failure: Secondary | ICD-10-CM | POA: Diagnosis not present

## 2015-09-06 DIAGNOSIS — I2510 Atherosclerotic heart disease of native coronary artery without angina pectoris: Secondary | ICD-10-CM | POA: Insufficient documentation

## 2015-09-06 DIAGNOSIS — I340 Nonrheumatic mitral (valve) insufficiency: Secondary | ICD-10-CM | POA: Insufficient documentation

## 2015-09-06 DIAGNOSIS — I351 Nonrheumatic aortic (valve) insufficiency: Secondary | ICD-10-CM | POA: Insufficient documentation

## 2015-09-06 DIAGNOSIS — E785 Hyperlipidemia, unspecified: Secondary | ICD-10-CM | POA: Insufficient documentation

## 2015-09-06 DIAGNOSIS — R29898 Other symptoms and signs involving the musculoskeletal system: Secondary | ICD-10-CM | POA: Diagnosis not present

## 2015-09-06 DIAGNOSIS — I509 Heart failure, unspecified: Secondary | ICD-10-CM | POA: Diagnosis present

## 2015-09-06 NOTE — Progress Notes (Signed)
  Echocardiogram 2D Echocardiogram has been performed.  Gary [name] 09/06/2015, 1:51 PM

## 2015-09-28 ENCOUNTER — Ambulatory Visit (HOSPITAL_COMMUNITY)
Admission: RE | Admit: 2015-09-28 | Discharge: 2015-09-28 | Disposition: A | Discharge status: Home / Self Care | Payer: Medicare Other | Source: Ambulatory Visit | Attending: Cardiology | Admitting: Cardiology

## 2015-09-28 ENCOUNTER — Encounter (HOSPITAL_COMMUNITY): Payer: Self-pay

## 2015-09-28 VITALS — BP 132/74 | HR 82 | Wt 191.5 lb

## 2015-09-28 DIAGNOSIS — Z8249 Family history of ischemic heart disease and other diseases of the circulatory system: Secondary | ICD-10-CM | POA: Diagnosis not present

## 2015-09-28 DIAGNOSIS — I130 Hypertensive heart and chronic kidney disease with heart failure and stage 1 through stage 4 chronic kidney disease, or unspecified chronic kidney disease: Secondary | ICD-10-CM | POA: Diagnosis not present

## 2015-09-28 DIAGNOSIS — I4821 Permanent atrial fibrillation: Secondary | ICD-10-CM

## 2015-09-28 DIAGNOSIS — I5022 Chronic systolic (congestive) heart failure: Secondary | ICD-10-CM | POA: Insufficient documentation

## 2015-09-28 DIAGNOSIS — E785 Hyperlipidemia, unspecified: Secondary | ICD-10-CM | POA: Diagnosis not present

## 2015-09-28 DIAGNOSIS — N319 Neuromuscular dysfunction of bladder, unspecified: Secondary | ICD-10-CM | POA: Insufficient documentation

## 2015-09-28 DIAGNOSIS — Z79899 Other long term (current) drug therapy: Secondary | ICD-10-CM | POA: Diagnosis not present

## 2015-09-28 DIAGNOSIS — Z7901 Long term (current) use of anticoagulants: Secondary | ICD-10-CM | POA: Diagnosis not present

## 2015-09-28 DIAGNOSIS — N183 Chronic kidney disease, stage 3 unspecified: Secondary | ICD-10-CM

## 2015-09-28 DIAGNOSIS — I5042 Chronic combined systolic (congestive) and diastolic (congestive) heart failure: Secondary | ICD-10-CM | POA: Diagnosis not present

## 2015-09-28 DIAGNOSIS — I2510 Atherosclerotic heart disease of native coronary artery without angina pectoris: Secondary | ICD-10-CM | POA: Diagnosis not present

## 2015-09-28 DIAGNOSIS — N189 Chronic kidney disease, unspecified: Secondary | ICD-10-CM | POA: Diagnosis not present

## 2015-09-28 DIAGNOSIS — I482 Chronic atrial fibrillation: Secondary | ICD-10-CM | POA: Insufficient documentation

## 2015-09-28 DIAGNOSIS — Z951 Presence of aortocoronary bypass graft: Secondary | ICD-10-CM | POA: Diagnosis not present

## 2015-09-28 DIAGNOSIS — I252 Old myocardial infarction: Secondary | ICD-10-CM | POA: Diagnosis not present

## 2015-09-28 LAB — BASIC METABOLIC PANEL
ANION GAP: 8 (ref 5–15)
BUN: 27 mg/dL — ABNORMAL HIGH (ref 6–20)
CO2: 29 mmol/L (ref 22–32)
Calcium: 10.2 mg/dL (ref 8.9–10.3)
Chloride: 102 mmol/L (ref 101–111)
Creatinine, Ser: 1.77 mg/dL — ABNORMAL HIGH (ref 0.61–1.24)
GFR, EST AFRICAN AMERICAN: 39 mL/min — AB (ref >60)
GFR, EST NON AFRICAN AMERICAN: 34 mL/min — AB (ref >60)
GLUCOSE: 98 mg/dL (ref 65–99)
POTASSIUM: 4.2 mmol/L (ref 3.5–5.1)
Sodium: 139 mmol/L (ref 135–145)

## 2015-09-28 LAB — BRAIN NATRIURETIC PEPTIDE: B Natriuretic Peptide: 176.2 pg/mL — ABNORMAL HIGH (ref 0.0–100.0)

## 2015-09-28 MED ORDER — CARVEDILOL 3.125 MG PO TABS: 3.1250 mg | ORAL_TABLET | Freq: Two times a day (BID) | ORAL | 3 refills | Status: DC

## 2015-09-28 NOTE — Patient Instructions (Signed)
Start Carvedilol 3.125mg  twice daily.  Routine lab work today. Will notify you of abnormal results  Your provider request you have a stress test.   Follow up with Dr.McLean in 3 weeks.

## 2015-09-29 NOTE — Progress Notes (Signed)
Patient ID: Gary Lope Sr., male   DOB: 1929-05-16, 82 y.o.   MRN: [id number] PCP: Dr Etter Sjogren Cardiology: Dr. Aundra Dubin  82 yo with history of CAD s/p CABG in 1995, chronic atrial fibrillation, CKD, and chronic diastolic CHF with prominent RV failure presents for CHF clinic evaluation.  Patient has struggled with right-sided failure. He was admitted in 5/16 with abdominal distention, volume overload, and elevated creatinine as high as 6.  Echo showed normal LV EF but RV was moderately dilated with moderate-severe TR, ?Ebsteins anomaly anatomy.  He had a paracentesis for ascites.  V/Q scan was negative for PE. He refused RHC while in the hospital.  He was diuresed and creatinine improved.  Repeat echo was done 6/17.  This showed EF down to 20-25% with regional wall motion abnormalities, moderately dilated RV, only mild TR with PASP 44 mmHg.   Gary [name] returns for followup after recent abnormal echo.  He is symptomatically stable.  He has had no chest or arm pain.  He has had no cardiac cath since his CABG.  He can walk on flat ground without dyspnea.  He has a chair lift to get up the stairs at home but can climb a short flight of steps without problems.  He has been sleeping in a recliner for 3 years.  No PND.  His hobby is woodworking, which he is able to do without problems.  Low back pain is more limiting to him than dyspnea.  His heart rate is controlled without nodal blockers.  No melena/BRBPR (on warfarin).  He has a chronic foley.    Labs (5/16): K 3.6, creatinine 6.15 => 2.47, LFTs normal, LDL 23, HCT 25.2 Labs (07/28/14): K 4.6, Creatinine 2.62, BNP 463 Labs (6/16): creatinine 2.23 => 2.01, K 4.4, BNP 227 Labs (8/16): LFTs normal Labs (12/16): K 4, creatinine 2.11, HCT 40.8 Labs (6/17): K 4.5, creatinine 1.6, LDL 37, HDL 30  PMH:  1. CAD: MI 1983, CABG 1995.   2. Chronic systolic failure with prominent RV failure: ?Ischemic cardiomyopathy.  - Echo (5/16) with EF 55-60%, moderate LV  hypertrophy, moderately dilated RV, massive biatrial enlargement, moderate-severe TR with some question of Ebstein's anomaly, PA systolic pressure 37 mmHg.  Patient has had ascites likely related to RV failure with h/o paracentesis.  - Echo (6/17) with EF 20-25%, regional wall motion abnormalities, mild Gary, moderately dilated RV with normal systolic function, mild TR (improved from prior), PASP 44 mmHg.  3. CKD 4. Permanent atrial fibrillation 5. LBBB 6. Hyperlipidemia 7. HTN 8. Complex pancreatic cyst: Followed by GI.  9. Neurogenic bladder: Chronic foley.  10. Anemia of renal disease.  11. Cholecystectomy 12. Appendectomy  SH: Married, nonsmoker, 4 kids, lives with son.   FH: CAD  ROS: All systems reviewed and negative except as per HPI.   Current Outpatient Prescriptions  Medication Sig Dispense Refill  . apixaban (ELIQUIS) 2.5 MG TABS tablet Take 1 tablet (2.5 mg total) by mouth 2 (two) times daily. 180 tablet 3  . atorvastatin (LIPITOR) 20 MG tablet Take 1 tablet (20 mg total) by mouth daily. 30 tablet 5  . folic acid (FOLVITE) 1 MG tablet Take 1 mg by mouth daily.      . Glucos-Chondroit-Hyaluron-MSM (GLUCOSAMINE CHONDROITIN JOINT PO) Take 1 tablet by mouth 2 (two) times daily.    . Meth-Hyo-M Bl-Na Phos-Ph Sal (URIBEL) 118 MG CAPS TAKE 1 TO 4 CAPSULE BY MOUTH EVERY DAY AS DIRECTED 360 capsule 5  . Multiple Vitamin (MULTIVITAMIN)  capsule Take 1 capsule by mouth daily.      . naftifine (NAFTIN) 1 % cream APPLY ONE APPLICATION TO THE AFFECTED AREA DAILY 60 g 2  . neomycin-bacitracin-polymyxin (NEOSPORIN) ointment Apply 1 application topically daily as needed for wound care.     . nystatin-triamcinolone ointment (MYCOLOG) APPLY EXTERNALLY TO THE AFFECTED AREA TWICE DAILY 60 g 0  . ondansetron (ZOFRAN) 8 MG tablet TAKE 1 TABLET BY MOUTH EVERY 8 HOURS AS NEEDED FOR NAUSEA AND VOMITING 90 tablet 0  . torsemide (DEMADEX) 20 MG tablet Take 2 tablets (40 mg total) by mouth 2 (two) times  daily. 120 tablet 3  . traMADol (ULTRAM) 50 MG tablet Take 50 mg by mouth daily.    Marland Kitchen triamcinolone cream (KENALOG) 0.1 % APPLY TO THE AFFECTED AREA TWICE DAILY AS NEEDED; DO NOT USE ON FACE, GROIN, OR UNDERARMS 454 g 0  . carvedilol (COREG) 3.125 MG tablet Take 1 tablet (3.125 mg total) by mouth 2 (two) times daily with a meal. 60 tablet 3  . ferrous sulfate 325 (65 FE) MG tablet Take 325 mg by mouth daily with breakfast.     . lidocaine (XYLOCAINE) 2 % jelly Place 1 application into the urethra daily as needed (pain).     No current facility-administered medications for this encounter.    BP 132/74   Pulse 82   Wt 191 lb 8 oz (86.9 kg)   SpO2 100%   BMI 25.97 kg/m  General: NAD Neck: JVP 7 cm, no thyromegaly or thyroid nodule.  Lungs: Clear to auscultation bilaterally with normal respiratory effort. CV: Nondisplaced PMI.  Heart irregular S1/S2, no S3/S4, 1/6 HSM LLSB.  Trace ankle edema.  No carotid bruit.  Normal pedal pulses.  Abdomen: Soft, nontender, no hepatosplenomegaly, nondistended.  Skin: Intact without lesions or rashes.  Neurologic: Alert and oriented x 3.  Psych: Normal affect. Extremities: No clubbing or cyanosis.  HEENT: Normal.   Assessment/Plan: 1. Atrial fibrillation: Permanent.  Markedly dilated atria on echo, unlikely to stay in NSR if DCCV were to be attempted. He is on warfarin.  He has not been on any nodal blockers and HR is reasonably controlled.   2. Chronic systolic CHF with prominent RV failure:  In the past, echo had shown RV dysfunction but normal LV systolic function.  Most recent echo in 6/17, however, showed EF 20-25% with wall motion abnormalities and moderately dilated RV.  Concern for ischemic cardiomyopathy with worsening of coronary disease, but he has had no event consistent with MI (chest, arm or neck pain) and his symptoms have been very stable.  No evidence that he has tachycardia-mediated cardiomyopathy (rate has been controlled).  - Given lack  of chest pain or other ischemic symptoms, would not go straight to coronary angiography, especially with significant CKD.  I will arrange for Lexiscan Cardiolite.  If there is significant ischemia, we will have to discuss risks/benefits of coronary angiography.  If no significant ischemia, would hold off.   - Continue torsemide 40 mg bid. If he gains 3 lbs overnight or 4 lbs in a week, he will take torsemide 60 mg bid x 2 days then back to 40 mg bid.  BMET/BNP today.  - Add Coreg 3.125 mg bid.  - Would hold off on ACEI/ARB with CKD, but would consider spironolactone at next visit if K and creatinine stable.     3. Tricuspid regurgitation: Moderate to severe TR on 2016.  There was question of possible Ebstein's anomaly on  echo.  However, most recent echo in 6/17 showed only mild TR.   4. Ascites: Suspect this has been due hepatic congestion from RV failure.  Now resolved.  5. CKD: Creatinine stable to improved recently.  Suspect cardiorenal component.  He is seeing Dr Joelyn Oms for nephrology.  HD would be difficult with RV failure.  BMET today.  6. CAD: s/p CABG.  No chest pain.  Continue statin, good lipids in 6/17.  No ASA with use of warfarin and stable CAD.    Followup in 3 weeks.   [name] 09/29/2015

## 2015-10-03 ENCOUNTER — Other Ambulatory Visit: Payer: Self-pay | Admitting: Family Medicine

## 2015-10-05 ENCOUNTER — Telehealth: Payer: Self-pay | Admitting: Family Medicine

## 2015-10-05 ENCOUNTER — Telehealth (HOSPITAL_COMMUNITY): Payer: Self-pay | Admitting: *Deleted

## 2015-10-05 MED ORDER — LIDOCAINE HCL 2 % EX GEL: 1.0000 "application " | Freq: Every day | CUTANEOUS | 1 refills | Status: DC | PRN

## 2015-10-05 NOTE — Telephone Encounter (Signed)
Relation to [id number] Call back number:[phone] Pharmacy: [facility] [place], [address] AT [address] [phone] (Phone) [phone] (Fax)     Reason for call:  Patient requesting a refill lidocaine (XYLOCAINE) 2 % jelly

## 2015-10-05 NOTE — Telephone Encounter (Signed)
Patient given detailed instructions per Myocardial Perfusion Study Information Sheet for the test on 10/07/15 at 0745. Patient notified to arrive 15 minutes early and that it is imperative to arrive on time for appointment to keep from having the test rescheduled.  If you need to cancel or reschedule your appointment, please call the office within 24 hours of your appointment. Failure to do so may result in a cancellation of your appointment, and a $50 no show fee. Patient verbalized understanding.Gary [name], Ranae Palms

## 2015-10-05 NOTE — Telephone Encounter (Signed)
FaxeD.     KP

## 2015-10-07 ENCOUNTER — Other Ambulatory Visit: Payer: Self-pay | Admitting: Cardiology

## 2015-10-07 ENCOUNTER — Encounter: Payer: Self-pay | Admitting: Cardiology

## 2015-10-07 ENCOUNTER — Ambulatory Visit (HOSPITAL_COMMUNITY): Payer: Medicare Other | Attending: Cardiovascular Disease

## 2015-10-07 DIAGNOSIS — R9439 Abnormal result of other cardiovascular function study: Secondary | ICD-10-CM | POA: Diagnosis not present

## 2015-10-07 DIAGNOSIS — I447 Left bundle-branch block, unspecified: Secondary | ICD-10-CM | POA: Diagnosis not present

## 2015-10-07 DIAGNOSIS — I5042 Chronic combined systolic (congestive) and diastolic (congestive) heart failure: Secondary | ICD-10-CM

## 2015-10-07 DIAGNOSIS — I110 Hypertensive heart disease with heart failure: Secondary | ICD-10-CM | POA: Diagnosis not present

## 2015-10-07 LAB — MYOCARDIAL PERFUSION IMAGING
CHL CUP NUCLEAR SSS: 18
LV sys vol: 112 mL
LVDIAVOL: 183 mL (ref 62–150)
NUC STRESS TID: 0.95
Peak HR: 85 {beats}/min
RATE: 0.33
Rest HR: 70 {beats}/min
SDS: 1
SRS: 17

## 2015-10-07 MED ORDER — TECHNETIUM TC 99M TETROFOSMIN IV KIT
10.6000 | PACK | Freq: Once | INTRAVENOUS | Status: AC | PRN
  Administered 2015-10-07: 11 via INTRAVENOUS
  Filled 2015-10-07: qty 11

## 2015-10-07 MED ORDER — TECHNETIUM TC 99M TETROFOSMIN IV KIT
32.7000 | PACK | Freq: Once | INTRAVENOUS | Status: AC | PRN
  Administered 2015-10-07: 32.7 via INTRAVENOUS
  Filled 2015-10-07: qty 33

## 2015-10-07 MED ORDER — REGADENOSON 0.4 MG/5ML IV SOLN
0.4000 mg | Freq: Once | INTRAVENOUS | Status: AC
  Administered 2015-10-07: 0.4 mg via INTRAVENOUS

## 2015-10-07 MED ORDER — AMINOPHYLLINE 25 MG/ML IV SOLN
75.0000 mg | Freq: Once | INTRAVENOUS | Status: AC
  Administered 2015-10-07: 75 mg via INTRAVENOUS

## 2015-10-11 ENCOUNTER — Other Ambulatory Visit: Payer: Self-pay | Admitting: *Deleted

## 2015-10-11 MED ORDER — TORSEMIDE 20 MG PO TABS: 40.0000 mg | ORAL_TABLET | Freq: Two times a day (BID) | ORAL | 3 refills | Status: DC

## 2015-10-11 NOTE — Telephone Encounter (Signed)
torsemide (DEMADEX) 20 MG tablet  Medication  Date: 04/16/2015 Department: [facility] Ordering/Authorizing: Larey Dresser, MD  Order Providers   Prescribing Provider Encounter Provider  Larey Dresser, MD [facility]  Medication Detail    Disp Refills Start End   torsemide (DEMADEX) 20 MG tablet 120 tablet 3 04/16/2015    Sig - Route: Take 2 tablets (40 mg total) by mouth 2 (two) times daily. - Oral   Class: No Print   Notes to Pharmacy: Please cancel all previous orders for current medication. Change in dosage or pill size.   Pharmacy   [id number] - [place], [address] AT [address]   WAS ON NO PRINT, WILL SEND TO PHARMACY AS THEY HAVE REQUESTED IT.

## 2015-10-14 ENCOUNTER — Other Ambulatory Visit: Payer: Self-pay | Admitting: Family Medicine

## 2015-10-18 ENCOUNTER — Ambulatory Visit (HOSPITAL_COMMUNITY)
Admission: RE | Admit: 2015-10-18 | Discharge: 2015-10-18 | Disposition: A | Discharge status: Home / Self Care | Payer: Medicare Other | Source: Ambulatory Visit | Attending: Cardiology | Admitting: Cardiology

## 2015-10-18 ENCOUNTER — Encounter (HOSPITAL_COMMUNITY): Payer: Self-pay

## 2015-10-18 VITALS — BP 128/73 | HR 74 | Wt 189.0 lb

## 2015-10-18 DIAGNOSIS — Z79899 Other long term (current) drug therapy: Secondary | ICD-10-CM | POA: Diagnosis not present

## 2015-10-18 DIAGNOSIS — E785 Hyperlipidemia, unspecified: Secondary | ICD-10-CM | POA: Insufficient documentation

## 2015-10-18 DIAGNOSIS — Z7901 Long term (current) use of anticoagulants: Secondary | ICD-10-CM | POA: Insufficient documentation

## 2015-10-18 DIAGNOSIS — I5042 Chronic combined systolic (congestive) and diastolic (congestive) heart failure: Secondary | ICD-10-CM | POA: Diagnosis not present

## 2015-10-18 DIAGNOSIS — N189 Chronic kidney disease, unspecified: Secondary | ICD-10-CM | POA: Diagnosis not present

## 2015-10-18 DIAGNOSIS — I252 Old myocardial infarction: Secondary | ICD-10-CM | POA: Diagnosis not present

## 2015-10-18 DIAGNOSIS — I482 Chronic atrial fibrillation: Secondary | ICD-10-CM | POA: Diagnosis not present

## 2015-10-18 DIAGNOSIS — Z8249 Family history of ischemic heart disease and other diseases of the circulatory system: Secondary | ICD-10-CM | POA: Diagnosis not present

## 2015-10-18 DIAGNOSIS — Z951 Presence of aortocoronary bypass graft: Secondary | ICD-10-CM | POA: Diagnosis not present

## 2015-10-18 DIAGNOSIS — I2510 Atherosclerotic heart disease of native coronary artery without angina pectoris: Secondary | ICD-10-CM | POA: Insufficient documentation

## 2015-10-18 DIAGNOSIS — I5022 Chronic systolic (congestive) heart failure: Secondary | ICD-10-CM | POA: Insufficient documentation

## 2015-10-18 DIAGNOSIS — I130 Hypertensive heart and chronic kidney disease with heart failure and stage 1 through stage 4 chronic kidney disease, or unspecified chronic kidney disease: Secondary | ICD-10-CM | POA: Diagnosis not present

## 2015-10-18 DIAGNOSIS — I4821 Permanent atrial fibrillation: Secondary | ICD-10-CM

## 2015-10-18 DIAGNOSIS — N183 Chronic kidney disease, stage 3 unspecified: Secondary | ICD-10-CM

## 2015-10-18 MED ORDER — CARVEDILOL 6.25 MG PO TABS: 6.2500 mg | ORAL_TABLET | Freq: Two times a day (BID) | ORAL | 1 refills | Status: DC

## 2015-10-18 MED ORDER — ATORVASTATIN CALCIUM 20 MG PO TABS: 20.0000 mg | ORAL_TABLET | Freq: Every day | ORAL | 3 refills | Status: DC

## 2015-10-18 MED ORDER — TORSEMIDE 20 MG PO TABS: 40.0000 mg | ORAL_TABLET | Freq: Two times a day (BID) | ORAL | 2 refills | Status: DC

## 2015-10-18 MED ORDER — SPIRONOLACTONE 25 MG PO TABS: 12.5000 mg | ORAL_TABLET | Freq: Every day | ORAL | 1 refills | Status: DC

## 2015-10-18 MED ORDER — APIXABAN 2.5 MG PO TABS: 2.5000 mg | ORAL_TABLET | Freq: Two times a day (BID) | ORAL | 3 refills | Status: DC

## 2015-10-18 NOTE — Patient Instructions (Signed)
Increase Carvedilol to 6.25 mg Twice daily   Start Spironolactone 12.5 mg (1/2 tab) daily  Labs in 10 days, Home Health will do this  We will contact you in 2 months to schedule your next appointment.

## 2015-10-18 NOTE — Progress Notes (Signed)
Patient ID: Gary Lope Sr., male   DOB: 13-Jul-1929, 82 y.o.   MRN: [id number] PCP: Dr Etter Sjogren Cardiology: Dr. Aundra Dubin  82 yo with history of CAD s/p CABG in 1995, chronic atrial fibrillation, CKD, and chronic diastolic CHF with prominent RV failure presents for CHF clinic evaluation.  Patient has struggled with right-sided failure. He was admitted in 5/16 with abdominal distention, volume overload, and elevated creatinine as high as 6.  Echo showed normal LV EF but RV was moderately dilated with moderate-severe TR, ?Ebsteins anomaly anatomy.  He had a paracentesis for ascites.  V/Q scan was negative for PE. He refused RHC while in the hospital.  He was diuresed and creatinine improved.  Repeat echo was done 6/17.  This showed EF down to 20-25% with regional wall motion abnormalities, moderately dilated RV, only mild TR with PASP 44 mmHg.  Lexiscan Cardiolite in 8/17 showed EF 39% with fixed defects, no ischemia.   Gary [name] returns for followup.  He is symptomatically stable.  He has had no chest or arm pain.  He can walk on flat ground without dyspnea.  He has a chair lift to get up the stairs at home but can climb a short flight of steps without problems.  He has been sleeping in a recliner for 3 years.  No PND.  His hobby is woodworking, which he is able to do without problems.  Low back pain is more limiting to him than dyspnea.  His heart rate is controlled without nodal blockers.  No melena/BRBPR (on warfarin).  He has a chronic foley.  Weight is stable.   Labs (5/16): K 3.6, creatinine 6.15 => 2.47, LFTs normal, LDL 23, HCT 25.2 Labs (07/28/14): K 4.6, Creatinine 2.62, BNP 463 Labs (6/16): creatinine 2.23 => 2.01, K 4.4, BNP 227 Labs (8/16): LFTs normal Labs (12/16): K 4, creatinine 2.11, HCT 40.8 Labs (6/17): K 4.5, creatinine 1.6, LDL 37, HDL 30 Labs (8/17): K 4.2, creatinine 1.77, BNP 176  PMH:  1. CAD: MI 1983, CABG 1995.   - Lexiscan Cardiolite (8/17) with EF 39%, fixed  inferior/inferoseptal defect and fixed apical septal/apical defect.  2. Chronic systolic failure with prominent RV failure: ?Ischemic cardiomyopathy.  - Echo (5/16) with EF 55-60%, moderate LV hypertrophy, moderately dilated RV, massive biatrial enlargement, moderate-severe TR with some question of Ebstein's anomaly, PA systolic pressure 37 mmHg.  Patient has had ascites likely related to RV failure with h/o paracentesis.  - Echo (6/17) with EF 20-25%, regional wall motion abnormalities, mild Gary, moderately dilated RV with normal systolic function, mild TR (improved from prior), PASP 44 mmHg.  3. CKD 4. Permanent atrial fibrillation 5. LBBB 6. Hyperlipidemia 7. HTN 8. Complex pancreatic cyst: Followed by GI.  9. Neurogenic bladder: Chronic foley.  10. Anemia of renal disease.  11. Cholecystectomy 12. Appendectomy  SH: Married, nonsmoker, 4 kids, lives with son.   FH: CAD  ROS: All systems reviewed and negative except as per HPI.   Current Outpatient Prescriptions  Medication Sig Dispense Refill  . apixaban (ELIQUIS) 2.5 MG TABS tablet Take 1 tablet (2.5 mg total) by mouth 2 (two) times daily. 180 tablet 3  . atorvastatin (LIPITOR) 20 MG tablet Take 1 tablet (20 mg total) by mouth daily. 90 tablet 3  . carvedilol (COREG) 6.25 MG tablet Take 1 tablet (6.25 mg total) by mouth 2 (two) times daily with a meal. 180 tablet 1  . ferrous sulfate 325 (65 FE) MG tablet Take 325 mg by mouth  daily with breakfast.     . folic acid (FOLVITE) 1 MG tablet Take 1 mg by mouth daily.      . Glucos-Chondroit-Hyaluron-MSM (GLUCOSAMINE CHONDROITIN JOINT PO) Take 1 tablet by mouth 2 (two) times daily.    Marland Kitchen lidocaine (XYLOCAINE) 2 % jelly Place 1 application into the urethra daily as needed (pain). 30 mL 1  . Meth-Hyo-M Bl-Na Phos-Ph Sal (URIBEL) 118 MG CAPS TAKE 1 TO 4 CAPSULE BY MOUTH EVERY DAY AS DIRECTED 360 capsule 5  . Multiple Vitamin (MULTIVITAMIN) capsule Take 1 capsule by mouth daily.      .  naftifine (NAFTIN) 1 % cream APPLY ONE APPLICATION TO THE AFFECTED AREA DAILY 60 g 2  . neomycin-bacitracin-polymyxin (NEOSPORIN) ointment Apply 1 application topically daily as needed for wound care.     . nystatin-triamcinolone ointment (MYCOLOG) APPLY EXTERNALLY TO THE AFFECTED AREA TWICE DAILY 60 g 0  . ondansetron (ZOFRAN) 8 MG tablet TAKE 1 TABLET BY MOUTH EVERY 8 HOURS AS NEEDED FOR NAUSEA AND VOMITING 90 tablet 0  . torsemide (DEMADEX) 20 MG tablet Take 2 tablets (40 mg total) by mouth 2 (two) times daily. 360 tablet 2  . traMADol (ULTRAM) 50 MG tablet Take 50 mg by mouth daily.    Marland Kitchen triamcinolone cream (KENALOG) 0.1 % APPLY TO THE AFFECTED AREA TWICE DAILY AS NEEDED; DO NOT USE ON FACE, GROIN, OR UNDERARMS 454 g 0  . spironolactone (ALDACTONE) 25 MG tablet Take 0.5 tablets (12.5 mg total) by mouth daily. 90 tablet 1   No current facility-administered medications for this encounter.    BP 128/73   Pulse 74   Wt 189 lb (85.7 kg)   SpO2 98%   BMI 25.63 kg/m  General: NAD Neck: JVP 7-8 cm, no thyromegaly or thyroid nodule.  Lungs: Clear to auscultation bilaterally with normal respiratory effort. CV: Nondisplaced PMI.  Heart irregular S1/S2, no S3/S4, 1/6 HSM LLSB.  1+ ankle edema.  No carotid bruit.  Normal pedal pulses.  Abdomen: Soft, nontender, no hepatosplenomegaly, nondistended.  Skin: Intact without lesions or rashes.  Neurologic: Alert and oriented x 3.  Psych: Normal affect. Extremities: No clubbing or cyanosis.  HEENT: Normal.   Assessment/Plan: 1. Atrial fibrillation: Permanent.  Markedly dilated atria on echo, unlikely to stay in NSR if DCCV were to be attempted. He is on warfarin.  HR is controlled.   2. Chronic systolic CHF with prominent RV failure:  In the past, echo had shown RV dysfunction but normal LV systolic function.  Most recent echo in 6/17, however, showed EF 20-25% with wall motion abnormalities and moderately dilated RV.  Concern for ischemic  cardiomyopathy with worsening of coronary disease, but he has had no event consistent with MI (chest, arm or neck pain) and his symptoms have been very stable.  Lexiscan Cardiolite in 8/17 showed only fixed defects, no ischemia.  No evidence that he has tachycardia-mediated cardiomyopathy (rate has been controlled).  NYHA class II symptoms, he is not volume overloaded on exam.    - Continue torsemide 40 mg bid. If he gains 3 lbs overnight or 4 lbs in a week, he will take torsemide 60 mg bid x 2 days then back to 40 mg bid.  - Increase Coreg to 6.25 mg daily and add spironolactone 12.5 daily. BMET in 2 wks.   - If creatinine remains stable, can start low dose lisinopril.  3. Tricuspid regurgitation: Moderate to severe TR on 2016.  There was question of possible Ebstein's anomaly  on echo.  However, most recent echo in 6/17 showed only mild TR.   4. Ascites: Suspect this has been due hepatic congestion from RV failure.  Now resolved.  5. CKD: Creatinine stable recently.  Suspect cardiorenal component.  He is seeing Dr Joelyn Oms for nephrology.  HD would be difficult with RV failure.  6. CAD: s/p CABG.  No chest pain.  Continue statin, good lipids in 6/17.  No ASA with use of warfarin and stable CAD.  Lexiscan Cardiolite in 8/17 showed areas of infarction with no ischemia, given CKD would hold off on Cardiac cath in absence of ischemia.  Followup in 2 months.   [name] 10/18/2015

## 2015-10-27 ENCOUNTER — Telehealth (HOSPITAL_COMMUNITY): Payer: Self-pay | Admitting: *Deleted

## 2015-10-27 NOTE — Telephone Encounter (Signed)
Pt's wife called to report that since starting the Spironolactone he has been itching, nauseated, lightheaded and having hot flashes.  Discussed w/Dr Aundra Dubin he recommends pt stop med, pt's wife is agreeable

## 2015-11-11 ENCOUNTER — Other Ambulatory Visit: Payer: Self-pay | Admitting: Family Medicine

## 2015-11-11 NOTE — Telephone Encounter (Signed)
Last filled 05/06/15 #90, d/c at the hospital. Please advise    KP

## 2015-11-11 NOTE — Telephone Encounter (Signed)
Last seen 08/13/15 Last filled #90- 0 refills   This medication was marked not taking on 07/13/2014. Not currently on med list. Please advise    PC

## 2015-12-03 ENCOUNTER — Other Ambulatory Visit: Payer: Self-pay | Admitting: Family Medicine

## 2015-12-09 ENCOUNTER — Telehealth: Payer: Self-pay | Admitting: *Deleted

## 2015-12-09 NOTE — Telephone Encounter (Signed)
Received HHC and Plan of Care paperwork via fax from [facility].  Billing sheet attached and placed in folder for Dr. Larose Kells to review and sign.//AB/CMA

## 2015-12-10 ENCOUNTER — Other Ambulatory Visit: Payer: Self-pay | Admitting: Family Medicine

## 2015-12-14 NOTE — Telephone Encounter (Signed)
Form completed and faxed to [facility]. Forms sent for scanning.

## 2015-12-21 ENCOUNTER — Encounter (HOSPITAL_COMMUNITY): Payer: Medicare Other

## 2015-12-23 ENCOUNTER — Other Ambulatory Visit: Payer: Self-pay | Admitting: Family Medicine

## 2015-12-23 NOTE — Telephone Encounter (Signed)
Last seen 08/13/15 and filled 12/03/15 #90 NO UDS  Please advise    KP

## 2015-12-26 ENCOUNTER — Other Ambulatory Visit: Payer: Self-pay | Admitting: Family Medicine

## 2015-12-31 DIAGNOSIS — N39498 Other specified urinary incontinence: Secondary | ICD-10-CM | POA: Diagnosis not present

## 2015-12-31 DIAGNOSIS — I255 Ischemic cardiomyopathy: Secondary | ICD-10-CM | POA: Diagnosis not present

## 2015-12-31 DIAGNOSIS — N319 Neuromuscular dysfunction of bladder, unspecified: Secondary | ICD-10-CM | POA: Diagnosis not present

## 2015-12-31 DIAGNOSIS — N184 Chronic kidney disease, stage 4 (severe): Secondary | ICD-10-CM | POA: Diagnosis not present

## 2015-12-31 DIAGNOSIS — I872 Venous insufficiency (chronic) (peripheral): Secondary | ICD-10-CM | POA: Diagnosis not present

## 2015-12-31 DIAGNOSIS — I5043 Acute on chronic combined systolic (congestive) and diastolic (congestive) heart failure: Secondary | ICD-10-CM | POA: Diagnosis not present

## 2015-12-31 DIAGNOSIS — I447 Left bundle-branch block, unspecified: Secondary | ICD-10-CM | POA: Diagnosis not present

## 2015-12-31 DIAGNOSIS — I482 Chronic atrial fibrillation: Secondary | ICD-10-CM | POA: Diagnosis not present

## 2015-12-31 DIAGNOSIS — Z466 Encounter for fitting and adjustment of urinary device: Secondary | ICD-10-CM | POA: Diagnosis not present

## 2015-12-31 DIAGNOSIS — Z7901 Long term (current) use of anticoagulants: Secondary | ICD-10-CM | POA: Diagnosis not present

## 2015-12-31 DIAGNOSIS — I2510 Atherosclerotic heart disease of native coronary artery without angina pectoris: Secondary | ICD-10-CM | POA: Diagnosis not present

## 2015-12-31 DIAGNOSIS — Z9181 History of falling: Secondary | ICD-10-CM | POA: Diagnosis not present

## 2016-01-06 ENCOUNTER — Telehealth: Payer: Self-pay | Admitting: Family Medicine

## 2016-01-06 NOTE — Telephone Encounter (Signed)
error:[id number] ° °

## 2016-01-06 NOTE — Telephone Encounter (Signed)
Caller name: Relationship to patient: Self Can be reached: [phone]  Pharmacy:  Reason for call: Patient request that a call or fax be sent to [facility] so that he can start getting his Urological supplies from them again. Fax # [phone] Phone # [phone]

## 2016-01-10 NOTE — Telephone Encounter (Signed)
Pt called in to follow up on message. Spoke with nurse. She says that she will send message to provider and follow up with pt. Pt says that he just need a new Rx so that his carrier will be able to ship out to him.     Thanks.

## 2016-01-10 NOTE — Telephone Encounter (Signed)
Reason for call: Patient request that a call or fax be sent to [facility] so that he can start getting his Urological supplies from them again. Please advise.

## 2016-01-10 NOTE — Telephone Encounter (Signed)
Need to know what supplies he needs

## 2016-01-11 NOTE — Telephone Encounter (Signed)
Spoke with Caryl Pina at [facility] and she states that previous urology supplies were signed by Dr Tresa Moore. Pt states he hasn't been to him in years as he is home bound and uses [facility] for urology care through Dr Carollee Herter. Spoke with Elmo Putt at [facility] [phone]) and requested them to fax order to nurse station. Awaiting order.

## 2016-01-11 NOTE — Telephone Encounter (Signed)
Spoke with patient's wife and she says patient is in need of the following: catheters, straps, and leg bags.

## 2016-01-11 NOTE — Telephone Encounter (Signed)
Ok to fax order

## 2016-01-12 NOTE — Telephone Encounter (Signed)
Order received, signed and faxed back to [phone] per fax # on cover sheet. Notified pt.

## 2016-01-26 NOTE — Progress Notes (Signed)
Complete

## 2016-02-04 ENCOUNTER — Other Ambulatory Visit: Payer: Self-pay | Admitting: Family Medicine

## 2016-02-04 NOTE — Telephone Encounter (Signed)
Last seen 08/13/15  Would you like for him to follow up  Rx printed and is at desk for signature.

## 2016-02-04 NOTE — Telephone Encounter (Signed)
Rx faxed to pharmacy  

## 2016-02-08 ENCOUNTER — Ambulatory Visit (HOSPITAL_COMMUNITY)
Admission: RE | Admit: 2016-02-08 | Discharge: 2016-02-08 | Disposition: A | Discharge status: Home / Self Care | Payer: Medicare Other | Source: Ambulatory Visit | Attending: Cardiology | Admitting: Cardiology

## 2016-02-08 ENCOUNTER — Encounter (HOSPITAL_COMMUNITY): Payer: Self-pay

## 2016-02-08 VITALS — BP 124/66 | HR 72 | Wt 198.5 lb

## 2016-02-08 DIAGNOSIS — Z9889 Other specified postprocedural states: Secondary | ICD-10-CM | POA: Diagnosis not present

## 2016-02-08 DIAGNOSIS — R188 Other ascites: Secondary | ICD-10-CM | POA: Diagnosis not present

## 2016-02-08 DIAGNOSIS — I482 Chronic atrial fibrillation: Secondary | ICD-10-CM | POA: Diagnosis not present

## 2016-02-08 DIAGNOSIS — I5022 Chronic systolic (congestive) heart failure: Secondary | ICD-10-CM | POA: Insufficient documentation

## 2016-02-08 DIAGNOSIS — Z79899 Other long term (current) drug therapy: Secondary | ICD-10-CM | POA: Insufficient documentation

## 2016-02-08 DIAGNOSIS — Z951 Presence of aortocoronary bypass graft: Secondary | ICD-10-CM | POA: Diagnosis not present

## 2016-02-08 DIAGNOSIS — I2510 Atherosclerotic heart disease of native coronary artery without angina pectoris: Secondary | ICD-10-CM | POA: Insufficient documentation

## 2016-02-08 DIAGNOSIS — I5042 Chronic combined systolic (congestive) and diastolic (congestive) heart failure: Secondary | ICD-10-CM | POA: Diagnosis not present

## 2016-02-08 DIAGNOSIS — N183 Chronic kidney disease, stage 3 unspecified: Secondary | ICD-10-CM

## 2016-02-08 DIAGNOSIS — I130 Hypertensive heart and chronic kidney disease with heart failure and stage 1 through stage 4 chronic kidney disease, or unspecified chronic kidney disease: Secondary | ICD-10-CM | POA: Diagnosis not present

## 2016-02-08 DIAGNOSIS — Z7901 Long term (current) use of anticoagulants: Secondary | ICD-10-CM | POA: Insufficient documentation

## 2016-02-08 DIAGNOSIS — Z5189 Encounter for other specified aftercare: Secondary | ICD-10-CM | POA: Diagnosis not present

## 2016-02-08 DIAGNOSIS — I4821 Permanent atrial fibrillation: Secondary | ICD-10-CM

## 2016-02-08 DIAGNOSIS — Z9049 Acquired absence of other specified parts of digestive tract: Secondary | ICD-10-CM | POA: Insufficient documentation

## 2016-02-08 MED ORDER — TORSEMIDE 20 MG PO TABS: ORAL_TABLET | ORAL | 3 refills | Status: DC

## 2016-02-08 MED ORDER — EPLERENONE 25 MG PO TABS: 25.0000 mg | ORAL_TABLET | Freq: Every day | ORAL | 6 refills | Status: DC

## 2016-02-08 NOTE — Patient Instructions (Signed)
Increase Torsemide to 40 mg (2 tabs) in AM and 80 mg (4 tabs) in PM  Start Eplerenone 25 mg daily  Labs in 10 days, home health will do these  Your physician recommends that you schedule a follow-up appointment in: 1 month

## 2016-02-08 NOTE — Progress Notes (Signed)
Order for bmet in 10 days faxed to [facility] at [phone]

## 2016-02-09 NOTE — Progress Notes (Signed)
Patient ID: Gary Lope Sr., male   DOB: 06/18/1929, 82 y.o.   MRN: [id number] PCP: Dr Etter Sjogren Cardiology: Dr. Aundra Dubin  82 yo with history of CAD s/p CABG in 1995, chronic atrial fibrillation, CKD, and chronic diastolic CHF with prominent RV failure presents for CHF clinic evaluation.  Patient has struggled with right-sided failure. He was admitted in 5/16 with abdominal distention, volume overload, and elevated creatinine as high as 6.  Echo showed normal LV EF but RV was moderately dilated with moderate-severe TR, ?Ebsteins anomaly anatomy.  He had a paracentesis for ascites.  V/Q scan was negative for PE. He refused RHC while in the hospital.  He was diuresed and creatinine improved.  Repeat echo was done 6/17.  This showed EF down to 20-25% with regional wall motion abnormalities, moderately dilated RV, only mild TR with PASP 44 mmHg.  Lexiscan Cardiolite in 8/17 showed EF 39% with fixed defects, no ischemia.   Mr Byron returns for followup.  He is symptomatically stable.  He has had no chest or arm pain.  He can walk on flat ground without dyspnea.  He has a chair lift to get up the stairs at home but can climb a short flight of steps without problems.  He has been sleeping in a recliner for 3 years.  No PND.  His hobby is woodworking, which he is able to do without problems.  Low back pain is more limiting to him than dyspnea.  His heart rate is controlled.  No melena/BRBPR (on warfarin).  He has a chronic foley.  Weight is up 9 lbs.  He had a couple of days last week where he was nauseated with poor appetite, but this has resolved completely.    Labs (5/16): K 3.6, creatinine 6.15 => 2.47, LFTs normal, LDL 23, HCT 25.2 Labs (07/28/14): K 4.6, Creatinine 2.62, BNP 463 Labs (6/16): creatinine 2.23 => 2.01, K 4.4, BNP 227 Labs (8/16): LFTs normal Labs (12/16): K 4, creatinine 2.11, HCT 40.8 Labs (6/17): K 4.5, creatinine 1.6, LDL 37, HDL 30 Labs (8/17): K 4.2, creatinine 1.77, BNP 176  PMH:  1.  CAD: MI 1983, CABG 1995.   - Lexiscan Cardiolite (8/17) with EF 39%, fixed inferior/inferoseptal defect and fixed apical septal/apical defect.  2. Chronic systolic failure with prominent RV failure: ?Ischemic cardiomyopathy.  - Echo (5/16) with EF 55-60%, moderate LV hypertrophy, moderately dilated RV, massive biatrial enlargement, moderate-severe TR with some question of Ebstein's anomaly, PA systolic pressure 37 mmHg.  Patient has had ascites likely related to RV failure with h/o paracentesis.  - Echo (6/17) with EF 20-25%, regional wall motion abnormalities, mild MR, moderately dilated RV with normal systolic function, mild TR (improved from prior), PASP 44 mmHg.  - Unable to tolerate spironolactone.  3. CKD 4. Permanent atrial fibrillation 5. LBBB 6. Hyperlipidemia 7. HTN 8. Complex pancreatic cyst: Followed by GI.  9. Neurogenic bladder: Chronic foley.  10. Anemia of renal disease.  11. Cholecystectomy 12. Appendectomy  SH: Married, nonsmoker, 4 kids, lives with son.   FH: CAD  ROS: All systems reviewed and negative except as per HPI.   Current Outpatient Prescriptions  Medication Sig Dispense Refill  . apixaban (ELIQUIS) 2.5 MG TABS tablet Take 1 tablet (2.5 mg total) by mouth 2 (two) times daily. 180 tablet 3  . atorvastatin (LIPITOR) 20 MG tablet Take 1 tablet (20 mg total) by mouth daily. 90 tablet 3  . carvedilol (COREG) 6.25 MG tablet Take 1 tablet (6.25 mg  total) by mouth 2 (two) times daily with a meal. 180 tablet 1  . ferrous sulfate 325 (65 FE) MG tablet Take 325 mg by mouth daily with breakfast.     . folic acid (FOLVITE) 1 MG tablet Take 1 mg by mouth daily.      . Glucos-Chondroit-Hyaluron-MSM (GLUCOSAMINE CHONDROITIN JOINT PO) Take 1 tablet by mouth 2 (two) times daily.    Marland Kitchen lidocaine (XYLOCAINE) 2 % jelly Place 1 application into the urethra daily as needed (pain). 30 mL 1  . Meth-Hyo-M Bl-Na Phos-Ph Sal (URIBEL) 118 MG CAPS TAKE 1 TO 4 CAPSULE BY MOUTH EVERY DAY  AS DIRECTED 360 capsule 5  . mirtazapine (REMERON) 15 MG tablet TAKE 1 TABLET(15 MG) BY MOUTH AT BEDTIME 30 tablet 0  . Multiple Vitamin (MULTIVITAMIN) capsule Take 1 capsule by mouth daily.      . naftifine (NAFTIN) 1 % cream APPLY ONE APPLICATION TO THE AFFECTED AREA DAILY 60 g 2  . neomycin-bacitracin-polymyxin (NEOSPORIN) ointment Apply 1 application topically daily as needed for wound care.     . nystatin-triamcinolone ointment (MYCOLOG) APPLY EXTERNALLY TO THE AFFECTED AREA TWICE DAILY 60 g 0  . ondansetron (ZOFRAN) 8 MG tablet TAKE 1 TABLET BY MOUTH EVERY 8 HOURS AS NEEDED FOR NAUSEA OR VOMITING 90 tablet 0  . torsemide (DEMADEX) 20 MG tablet Take 2 tabs in AM and 4 tabs in PM 180 tablet 3  . traMADol (ULTRAM) 50 MG tablet TAKE 1 TABLET BY MOUTH EVERY 8 HOURS AS NEEDED 90 tablet 0  . triamcinolone cream (KENALOG) 0.1 % APPLY TO THE AFFECTED AREA TWICE DAILY AS NEEDED; DO NOT USE ON FACE, GROIN, OR UNDERARMS 454 g 0  . eplerenone (INSPRA) 25 MG tablet Take 1 tablet (25 mg total) by mouth daily. 30 tablet 6   No current facility-administered medications for this encounter.    BP 124/66   Pulse 72   Wt 198 lb 8 oz (90 kg)   SpO2 100%   BMI 26.92 kg/m  General: NAD Neck: JVP 8 cm with HJR, no thyromegaly or thyroid nodule.  Lungs: Clear to auscultation bilaterally with normal respiratory effort. CV: Nondisplaced PMI.  Heart irregular S1/S2, no S3/S4, 1/6 HSM LLSB/apex.  Trace ankle edema.  No carotid bruit.  Normal pedal pulses.  Abdomen: Soft, nontender, no hepatosplenomegaly, nondistended.  Skin: Intact without lesions or rashes.  Neurologic: Alert and oriented x 3.  Psych: Normal affect. Extremities: No clubbing or cyanosis.  HEENT: Normal.   Assessment/Plan: 1. Atrial fibrillation: Permanent.  Markedly dilated atria on echo, unlikely to stay in NSR if DCCV were to be attempted. He is on warfarin.  HR is controlled.   2. Chronic systolic CHF with prominent RV failure:  In the  past, echo had shown RV dysfunction but normal LV systolic function.  Most recent echo in 6/17, however, showed EF 20-25% with wall motion abnormalities and moderately dilated RV.  Concern for ischemic cardiomyopathy with worsening of coronary disease, but he has had no event consistent with MI (chest, arm or neck pain) and his symptoms have been very stable.  Lexiscan Cardiolite in 8/17 showed only fixed defects, no ischemia.  No evidence that he has tachycardia-mediated cardiomyopathy (rate has been controlled).  NYHA class II symptoms.  Today weight is up and he appears to be volume overloaded.  - Increase torsemide to 40 qam/80 qpm.  I will call to get BMET from yesterday from Dr. Adin Hector office.  BMET/BNP in 10 days.   -  Continue Coreg 6.25 mg bid. - He did not tolerate spironolactone.  I will try him on eplerenone 25 mg daily.     - If creatinine remains stable, can start low dose lisinopril in the future.  3. Tricuspid regurgitation: Moderate to severe TR on 2016.  There was question of possible Ebstein's anomaly on echo.  However, most recent echo in 6/17 showed only mild TR.   4. Ascites: Suspect this has been due hepatic congestion from RV failure.  Now resolved.  5. CKD: Creatinine stable recently.  Suspect cardiorenal component.  He is seeing Dr Joelyn Oms for nephrology.  HD would be difficult with RV failure.  6. CAD: s/p CABG.  No chest pain.  Continue statin, good lipids in 6/17.  No ASA with use of warfarin and stable CAD.  Lexiscan Cardiolite in 8/17 showed areas of infarction with no ischemia, given CKD would hold off on Cardiac cath in absence of ischemia.  Followup in 1 month.   [name] 02/09/2016

## 2016-02-19 ENCOUNTER — Other Ambulatory Visit: Payer: Self-pay | Admitting: Family Medicine

## 2016-02-25 LAB — BASIC METABOLIC PANEL
BUN: 43 mg/dL — AB (ref 4–21)
Creatinine: 1.8 mg/dL — AB (ref 0.6–1.3)
GLUCOSE: 101 mg/dL
Potassium: 4.4 mmol/L (ref 3.4–5.3)
SODIUM: 139 mmol/L (ref 137–147)

## 2016-02-28 ENCOUNTER — Other Ambulatory Visit: Payer: Self-pay | Admitting: Family Medicine

## 2016-03-10 ENCOUNTER — Other Ambulatory Visit: Payer: Self-pay | Admitting: Family Medicine

## 2016-03-13 NOTE — Telephone Encounter (Signed)
Faxed hardcopy for tramadol to [facility]

## 2016-03-13 NOTE — Telephone Encounter (Signed)
Last refill #90 with 0 refills on 02/04/2016 Last office visit 08/13/2015

## 2016-03-14 ENCOUNTER — Ambulatory Visit (HOSPITAL_COMMUNITY)
Admission: RE | Admit: 2016-03-14 | Discharge: 2016-03-14 | Disposition: A | Discharge status: Home / Self Care | Payer: Medicare Other | Source: Ambulatory Visit | Attending: Cardiology | Admitting: Cardiology

## 2016-03-14 ENCOUNTER — Encounter (HOSPITAL_COMMUNITY): Payer: Self-pay

## 2016-03-14 VITALS — BP 123/61 | HR 69 | Wt 193.0 lb

## 2016-03-14 DIAGNOSIS — I5042 Chronic combined systolic (congestive) and diastolic (congestive) heart failure: Secondary | ICD-10-CM | POA: Diagnosis not present

## 2016-03-14 DIAGNOSIS — I482 Chronic atrial fibrillation: Secondary | ICD-10-CM

## 2016-03-14 DIAGNOSIS — I4821 Permanent atrial fibrillation: Secondary | ICD-10-CM

## 2016-03-14 LAB — CBC
HCT: 41.5 % (ref 39.0–52.0)
Hemoglobin: 13.4 g/dL (ref 13.0–17.0)
MCH: 29.1 pg (ref 26.0–34.0)
MCHC: 32.3 g/dL (ref 30.0–36.0)
MCV: 90 fL (ref 78.0–100.0)
Platelets: 168 10*3/uL (ref 150–400)
RBC: 4.61 MIL/uL (ref 4.22–5.81)
RDW: 14.1 % (ref 11.5–15.5)
WBC: 6.4 10*3/uL (ref 4.0–10.5)

## 2016-03-14 LAB — BASIC METABOLIC PANEL
Anion gap: 10 (ref 5–15)
BUN: 36 mg/dL — AB (ref 6–20)
CALCIUM: 10.2 mg/dL (ref 8.9–10.3)
CHLORIDE: 95 mmol/L — AB (ref 101–111)
CO2: 30 mmol/L (ref 22–32)
CREATININE: 1.75 mg/dL — AB (ref 0.61–1.24)
GFR calc Af Amer: 39 mL/min — ABNORMAL LOW (ref >60)
GFR calc non Af Amer: 34 mL/min — ABNORMAL LOW (ref >60)
GLUCOSE: 106 mg/dL — AB (ref 65–99)
Potassium: 4.4 mmol/L (ref 3.5–5.1)
Sodium: 135 mmol/L (ref 135–145)

## 2016-03-14 MED ORDER — CARVEDILOL 6.25 MG PO TABS: 6.2500 mg | ORAL_TABLET | Freq: Two times a day (BID) | ORAL | 1 refills | Status: DC

## 2016-03-14 MED ORDER — OXYCODONE HCL 5 MG PO TABA: 5.0000 mg | ORAL_TABLET | ORAL | 0 refills | Status: DC

## 2016-03-14 MED ORDER — EPLERENONE 25 MG PO TABS: 25.0000 mg | ORAL_TABLET | Freq: Every day | ORAL | 3 refills | Status: DC

## 2016-03-14 NOTE — Patient Instructions (Signed)
We have given you a prescription for Oxycodone to take 1 tab when you have your catheter changed.  Labs today  Your physician recommends that you schedule a follow-up appointment in: 3 months

## 2016-03-16 NOTE — Progress Notes (Signed)
Patient ID: Gary Lope Sr., male   DOB: 08/15/1929, 83 y.o.   MRN: [id number] PCP: Dr Etter Sjogren Cardiology: Dr. Aundra Dubin  83 yo with history of CAD s/p CABG in 1995, chronic atrial fibrillation, CKD, and chronic diastolic CHF with prominent RV failure presents for CHF clinic evaluation.  Patient has struggled with right-sided failure. He was admitted in 5/16 with abdominal distention, volume overload, and elevated creatinine as high as 6.  Echo showed normal LV EF but RV was moderately dilated with moderate-severe TR, ?Ebsteins anomaly anatomy.  He had a paracentesis for ascites.  V/Q scan was negative for PE. He refused RHC while in the hospital.  He was diuresed and creatinine improved.  Repeat echo was done 6/17.  This showed EF down to 20-25% with regional wall motion abnormalities, moderately dilated RV, only mild TR with PASP 44 mmHg.  Lexiscan Cardiolite in 8/17 showed EF 39% with fixed defects, no ischemia.   Mr Bouwman returns for followup.  He is symptomatically stable.  He has had no chest or arm pain.  He can walk on flat ground without dyspnea.  He has a chair lift to get up the stairs at home but can climb a short flight of steps without problems.  He has been sleeping in a recliner for 3 years.  No PND.  His hobby is woodworking, which he is able to do without problems.  Low back pain is more limiting to him than dyspnea.  His heart rate is controlled.  No melena/BRBPR (on warfarin).  He has a chronic foley.  Weight is down 5 lbs since we increased his diuretic last appointment.  Feeling anxious today because his wife had some bad health news.   Labs (5/16): K 3.6, creatinine 6.15 => 2.47, LFTs normal, LDL 23, HCT 25.2 Labs (07/28/14): K 4.6, Creatinine 2.62, BNP 463 Labs (6/16): creatinine 2.23 => 2.01, K 4.4, BNP 227 Labs (8/16): LFTs normal Labs (12/16): K 4, creatinine 2.11, HCT 40.8 Labs (6/17): K 4.5, creatinine 1.6, LDL 37, HDL 30 Labs (8/17): K 4.2, creatinine 1.77, BNP 176  PMH:   1. CAD: MI 1983, CABG 1995.   - Lexiscan Cardiolite (8/17) with EF 39%, fixed inferior/inferoseptal defect and fixed apical septal/apical defect.  2. Chronic systolic failure with prominent RV failure: ?Ischemic cardiomyopathy.  - Echo (5/16) with EF 55-60%, moderate LV hypertrophy, moderately dilated RV, massive biatrial enlargement, moderate-severe TR with some question of Ebstein's anomaly, PA systolic pressure 37 mmHg.  Patient has had ascites likely related to RV failure with h/o paracentesis.  - Echo (6/17) with EF 20-25%, regional wall motion abnormalities, mild MR, moderately dilated RV with normal systolic function, mild TR (improved from prior), PASP 44 mmHg.  - Unable to tolerate spironolactone.  3. CKD 4. Permanent atrial fibrillation 5. LBBB 6. Hyperlipidemia 7. HTN 8. Complex pancreatic cyst: Followed by GI.  9. Neurogenic bladder: Chronic foley.  10. Anemia of renal disease.  11. Cholecystectomy 12. Appendectomy  SH: Married, nonsmoker, 4 kids, lives with son.   FH: CAD  ROS: All systems reviewed and negative except as per HPI.   Current Outpatient Prescriptions  Medication Sig Dispense Refill  . apixaban (ELIQUIS) 2.5 MG TABS tablet Take 1 tablet (2.5 mg total) by mouth 2 (two) times daily. 180 tablet 3  . atorvastatin (LIPITOR) 20 MG tablet Take 1 tablet (20 mg total) by mouth daily. 90 tablet 3  . eplerenone (INSPRA) 25 MG tablet Take 1 tablet (25 mg total) by mouth  daily. 90 tablet 3  . ferrous sulfate 325 (65 FE) MG tablet Take 325 mg by mouth daily with breakfast.     . folic acid (FOLVITE) 1 MG tablet Take 1 mg by mouth daily.      . Glucos-Chondroit-Hyaluron-MSM (GLUCOSAMINE CHONDROITIN JOINT PO) Take 1 tablet by mouth 2 (two) times daily.    Marland Kitchen lidocaine (XYLOCAINE) 2 % jelly Place 1 application into the urethra daily as needed (pain). 30 mL 1  . Meth-Hyo-M Bl-Na Phos-Ph Sal (URIBEL) 118 MG CAPS TAKE 1 TO 4 CAPSULE BY MOUTH EVERY DAY AS DIRECTED 360 capsule 5   . mirtazapine (REMERON) 15 MG tablet TAKE 1 TABLET(15 MG) BY MOUTH AT BEDTIME 30 tablet 0  . Multiple Vitamin (MULTIVITAMIN) capsule Take 1 capsule by mouth daily.      . naftifine (NAFTIN) 1 % cream APPLY ONE APPLICATION TO THE AFFECTED AREA DAILY 60 g 2  . neomycin-bacitracin-polymyxin (NEOSPORIN) ointment Apply 1 application topically daily as needed for wound care.     . nystatin-triamcinolone ointment (MYCOLOG) APPLY EXTERNALLY TO THE AFFECTED AREA TWICE DAILY 60 g 0  . ondansetron (ZOFRAN) 8 MG tablet TAKE 1 TABLET BY MOUTH EVERY 8 HOURS AS NEEDED FOR NAUSEA OR VOMITING 90 tablet 0  . torsemide (DEMADEX) 20 MG tablet Take 2 tabs in AM and 4 tabs in PM 180 tablet 3  . traMADol (ULTRAM) 50 MG tablet TAKE 1 TABLET BY MOUTH EVERY 8 HOURS AS NEEDED 90 tablet 0  . triamcinolone cream (KENALOG) 0.1 % APPLY TO THE AFFECTED AREA TWICE DAILY AS NEEDED; DO NOT USE ON FACE, GROIN, OR UNDERARMS 454 g 0  . carvedilol (COREG) 6.25 MG tablet Take 1 tablet (6.25 mg total) by mouth 2 (two) times daily with a meal. 180 tablet 1  . OxyCODONE HCl, Abuse Deter, (OXAYDO) 5 MG TABA Take 5 mg by mouth every 30 (thirty) days. When your catheter is changed 6 tablet 0   No current facility-administered medications for this encounter.    BP 123/61   Pulse 69   Wt 193 lb (87.5 kg)   SpO2 99%   BMI 26.18 kg/m  General: NAD Neck: No JVD, no thyromegaly or thyroid nodule.  Lungs: Clear to auscultation bilaterally with normal respiratory effort. CV: Nondisplaced PMI.  Heart irregular S1/S2, no S3/S4, 1/6 HSM LLSB/apex.  Trace ankle edema.  No carotid bruit.  Normal pedal pulses.  Abdomen: Soft, nontender, no hepatosplenomegaly, nondistended.  Skin: Intact without lesions or rashes.  Neurologic: Alert and oriented x 3.  Psych: Normal affect. Extremities: No clubbing or cyanosis.  HEENT: Normal.   Assessment/Plan: 1. Atrial fibrillation: Permanent.  Markedly dilated atria on echo, unlikely to stay in NSR if DCCV  were to be attempted. He is on warfarin.  HR is controlled.   2. Chronic systolic CHF with prominent RV failure:  In the past, echo had shown RV dysfunction but normal LV systolic function.  Most recent echo in 6/17, however, showed EF 20-25% with wall motion abnormalities and moderately dilated RV.  Concern for ischemic cardiomyopathy with worsening of coronary disease, but he has had no event consistent with MI (chest, arm or neck pain) and his symptoms have been very stable.  Lexiscan Cardiolite in 8/17 showed only fixed defects, no ischemia.  No evidence that he has tachycardia-mediated cardiomyopathy (rate has been controlled).  NYHA class II-III symptoms, more limited by orthopedic issues.  Weight is down today and he looks euvolemic on higher torsemide.  - Continue  torsemide 40 qam/80 qpm. BMET today.    - Continue Coreg 6.25 mg bid. - Continue eplerenone (did not tolerate spironolactone).  - If creatinine remains stable, can start low dose lisinopril in the future.  3. Tricuspid regurgitation: Moderate to severe TR on 2016.  There was question of possible Ebstein's anomaly on echo.  However, most recent echo in 6/17 showed only mild TR.   4. Ascites: Suspect this has been due hepatic congestion from RV failure.  Now resolved.  5. CKD: Creatinine stable recently.  Suspect cardiorenal component.  He is seeing Dr Joelyn Oms for nephrology.  HD would be difficult with RV failure.  6. CAD: s/p CABG.  No chest pain.  Continue statin, good lipids in 6/17.  No ASA with use of warfarin and stable CAD.  Lexiscan Cardiolite in 8/17 showed areas of infarction with no ischemia, given CKD would hold off on Cardiac cath in absence of ischemia.  Followup in 3 months   [name] 03/16/2016

## 2016-03-23 ENCOUNTER — Other Ambulatory Visit (HOSPITAL_COMMUNITY): Payer: Self-pay | Admitting: *Deleted

## 2016-03-23 MED ORDER — EPLERENONE 25 MG PO TABS: 25.0000 mg | ORAL_TABLET | Freq: Every day | ORAL | 3 refills | Status: DC

## 2016-03-28 ENCOUNTER — Other Ambulatory Visit (HOSPITAL_COMMUNITY): Payer: Self-pay | Admitting: *Deleted

## 2016-03-28 MED ORDER — CARVEDILOL 6.25 MG PO TABS: 6.2500 mg | ORAL_TABLET | Freq: Two times a day (BID) | ORAL | 1 refills | Status: DC

## 2016-04-07 ENCOUNTER — Telehealth: Payer: Self-pay | Admitting: Family Medicine

## 2016-04-07 NOTE — Telephone Encounter (Signed)
Caller name: Relationship to patient: Self Can be reached: [phone] Pharmacy:  [facility] [place], [address] AT [facility] [address] [place] [phone] (Phone) [phone] (Fax)     Reason for call: Patient states that the medication he was given for nausea is causing more nausea and bad headaches. Request another medication for nausea

## 2016-04-10 ENCOUNTER — Telehealth: Payer: Self-pay | Admitting: Family Medicine

## 2016-04-10 ENCOUNTER — Other Ambulatory Visit: Payer: Self-pay | Admitting: Family Medicine

## 2016-04-10 NOTE — Telephone Encounter (Signed)
Pt is requesting alternative to Zofran-states he has worsening nausea and HA. See below. Please advise.

## 2016-04-10 NOTE — Telephone Encounter (Signed)
Ok to refill---  Pt has home health at the house as well

## 2016-04-10 NOTE — Telephone Encounter (Signed)
Patient calling back checking on the status of message below, please advise

## 2016-04-10 NOTE — Telephone Encounter (Signed)
Faxed hardcopy for Tramadol to [facility].

## 2016-04-10 NOTE — Telephone Encounter (Signed)
Relation to WU:ZRVU Call back number: [phone] Pharmacy:  Reason for call:  Patient states he is house bound and doesn't have transportation, please advise

## 2016-04-10 NOTE — Telephone Encounter (Signed)
Last office visit 08/13/2015 Last refill  #90 with 0 refills on 03/13/2016 No upcoming appt. No contract/no UDS

## 2016-04-10 NOTE — Telephone Encounter (Signed)
Phenergan 25mg   Qid #30

## 2016-04-10 NOTE — Telephone Encounter (Signed)
Relation to CX:WNPI Call back number:[phone] Pharmacy: [facility] [place], [address] AT [address] [phone] (Phone) [phone] (Fax)    Reason for call:  Patient requesting a refill traMADol (ULTRAM) 50 MG tablet changed to 100 MG, mirtazapine (REMERON) 15 MG tablet increase to 45 MG , nystatin-triamcinolone ointment (MYCOLOG) and naftifine (NAFTIN) 1 % cream

## 2016-04-10 NOTE — Telephone Encounter (Signed)
The only nausea medication I see on his list is Zofran. Per chart review, he has been using this for many years. Given his symptoms, I recommend office visit.

## 2016-04-11 ENCOUNTER — Other Ambulatory Visit: Payer: Self-pay

## 2016-04-11 MED ORDER — NYSTATIN-TRIAMCINOLONE 100000-0.1 UNIT/GM-% EX OINT: TOPICAL_OINTMENT | Freq: Two times a day (BID) | CUTANEOUS | 0 refills | Status: DC

## 2016-04-11 MED ORDER — PROMETHAZINE HCL 25 MG PO TABS: 25.0000 mg | ORAL_TABLET | Freq: Four times a day (QID) | ORAL | 0 refills | Status: DC | PRN

## 2016-04-11 MED ORDER — NAFTIFINE HCL 1 % EX CREA: TOPICAL_CREAM | Freq: Every day | CUTANEOUS | 2 refills | Status: DC

## 2016-04-11 MED ORDER — MIRTAZAPINE 45 MG PO TABS: ORAL_TABLET | ORAL | 0 refills | Status: DC

## 2016-04-11 NOTE — Telephone Encounter (Signed)
Can inc remeron to 30 mg -- we don't normally go from 15 to 45 mg Not a good idea to take 100 mg of ultram regularly--- maybe go through home health and they can help [place] figure out what is going on

## 2016-04-11 NOTE — Telephone Encounter (Signed)
New Rx sent to the pharmacy. LB

## 2016-04-11 NOTE — Telephone Encounter (Signed)
Medication filled to pharmacy as requested.   

## 2016-04-11 NOTE — Addendum Note (Signed)
Addended by: Sharon Seller B on: 04/11/2016 06:18 PM   Modules accepted: Orders

## 2016-04-11 NOTE — Telephone Encounter (Signed)
Creams sent in and tramadol completed on 03/10/2016 Advise on remeron refil

## 2016-04-12 ENCOUNTER — Other Ambulatory Visit: Payer: Self-pay | Admitting: Family Medicine

## 2016-04-12 ENCOUNTER — Other Ambulatory Visit (HOSPITAL_COMMUNITY): Payer: Self-pay | Admitting: *Deleted

## 2016-04-12 MED ORDER — TORSEMIDE 20 MG PO TABS: ORAL_TABLET | ORAL | 3 refills | Status: DC

## 2016-04-14 ENCOUNTER — Telehealth: Payer: Self-pay | Admitting: *Deleted

## 2016-04-14 NOTE — Telephone Encounter (Signed)
Received completed and signed Home Health Certification and Plan of Care from Dr. Carollee Herter.  Faxed paperwork to [facility] @ [phone]).  Confirmation received.//AB/CMA

## 2016-04-18 ENCOUNTER — Other Ambulatory Visit: Payer: Self-pay | Admitting: Family Medicine

## 2016-04-18 ENCOUNTER — Telehealth: Payer: Self-pay | Admitting: Family Medicine

## 2016-04-18 NOTE — Telephone Encounter (Signed)
OK FOR VERBAL ORDER

## 2016-04-18 NOTE — Telephone Encounter (Signed)
Benjamine Mola - [facility] - [phone]  Called in for verbal orders. She said that the pt would like to be prescribed Monurol if his results are positive.

## 2016-04-18 NOTE — Telephone Encounter (Signed)
HHRN informed of PCP verbal ok 

## 2016-04-18 NOTE — Telephone Encounter (Signed)
Elizabeth from [facility] called back to follow up on request for verbal order. States she has the urine but needs an order before she can take it to the lab. Plse adv.

## 2016-04-18 NOTE — Telephone Encounter (Signed)
Spouse called in to request a work order to be sent to [facility]. She said that a nurse comes out to the home to see her. She would like to know if provider could give orders to test pt for a UTI. She said that he is expressing symptoms such as nausea, not much of an appetite.    Please advise

## 2016-04-19 ENCOUNTER — Encounter: Payer: Self-pay | Admitting: Family Medicine

## 2016-04-19 NOTE — Telephone Encounter (Signed)
Results received and reviewed with Dr. Larose Kells.

## 2016-04-19 NOTE — Telephone Encounter (Signed)
Jessica from [employer] [facility] [facility] called back.  UA-"definitely positive".  Urine culture will be sent out to [facility] and cannot be ran STAT.  Awaiting official faxed results.

## 2016-04-19 NOTE — Telephone Encounter (Signed)
Called wife back.  Reviewed results with her.  I strongly advised her to take patient to the ER given reported symptoms and as Dr. Larose Kells mentioned a UA not being a reliable marker for an active infection due to his indwelling catheter.  She then asked if she had mentioned that the catheter is no longer putting out, I told her no, and given that report, he definitely needs to go to the ER now.  She finally agreed to take patient to the ER.  She was advised again to call EMS given their lack of transportation.  She was adamant about not calling EMS and said "I will call my son and have him take him."

## 2016-04-19 NOTE — Telephone Encounter (Signed)
I agree please make sure he goes to ER

## 2016-04-19 NOTE — Telephone Encounter (Addendum)
Dr. Virl Son advise.  Wife requesting Monurol be called in to [facility] until results are in.

## 2016-04-19 NOTE — Telephone Encounter (Signed)
Advise patient: If he has nausea, he is getting worse, has  loose stools, getting lethargic, he must go to the ER now. I don't  think prescribing an antibiotic based on his symptoms is appropriate.

## 2016-04-19 NOTE — Telephone Encounter (Signed)
Addendum: UA showed RBCs, bacteria; patient has an indwelling bladder catheter, thus  UA not reliable as a marker of an active  infection.

## 2016-04-19 NOTE — Telephone Encounter (Signed)
Called Wife and discussed provider's recommendations.  She said there is no way we can get him to the ER. Stating he is too sick and too weak to go and they have no transportation.  Wife was advised to call EMS.  She became very upset, stating "I don't know what to do with your office. I am about done with Dr. Etter Sjogren.....Marland Kitchenyou guys have been no help."  Then she hung up.

## 2016-04-19 NOTE — Telephone Encounter (Addendum)
Called to follow up. Wife states she's very upset that an order for a urine specimen was not given until late yesterday.  She said she still has not heard back regarding results and patient is getting worse.  Per wife, pt's nausea has gotten worse, he's not eating, he's having loose stool, and is very lethargic.  She said these were all the symptoms patient experienced the last time he had a UTI and she needs answers.  She refused to bring patient in to be seen, stating he has already been evaluated by a nurse and a specimen has been collected. She said that he has been staying hydrated by drinking plenty of water, however, she is worried about him and something needs to be done today.  She said they can't wait another 24 hours before hearing anything. Apologies were offered.  Informed wife that I would call Benjamine Mola for an update.    Currie Paris, she stated that she received an order late yesterday evening.  During that time [facility] was closed.  Therefore specimen was not dropped off until this morning.  She said she informed the lab to call our office once results were in.  She also reported that the only symptom patient complained of yesterday was nausea and that his vital signs were stable at the time of visit.    Called wife back to inform her that specimen was dropped off this morning, and that we had not heard from [facility] yet.  Wife requested that I call [facility] and get them to put in a rush order for specimen.    Called Benjamine Mola to inquiry about which [facility] is running specimen.  Benjamine Mola stated that specimen was dropped off at [facility] in [place] and that she would call them for an update.    Benjamine Mola called back and said that specimen had not been ran yet, and that she requested them to test sample STAT and to call office with results.  Awaiting call from [facility] [facility].

## 2016-04-19 NOTE — Telephone Encounter (Signed)
Spouse called back crying stating she's very upset spouse is in pain and she called 8am 04/18/16 and verbal orders were given after 5pm yesterday and would like to speak with a nurse due to spouse concerns.

## 2016-04-20 MED ORDER — LIDOCAINE HCL 2 % EX GEL: 1.0000 "application " | Freq: Every day | CUTANEOUS | 1 refills | Status: DC | PRN

## 2016-04-20 MED ORDER — FOSFOMYCIN TROMETHAMINE 3 G PO PACK: 3.0000 g | PACK | Freq: Once | ORAL | 0 refills | Status: AC

## 2016-04-20 NOTE — Telephone Encounter (Signed)
Called wife.  She stated that they are not doing too bad today.  Elizabeth from [facility] came out yesterday evening around 6:30pm to assess patient and change his catheter.  Changing his catheter helped.  Per wife, patient's vital signs were stabled.  Wife was made aware that rx for monurol and xylocaine was sent to pharmacy as requested.  Wife was appreciative.  No additional needs voiced at this time.    Called Elizabeth and left a voice message for return call.

## 2016-04-20 NOTE — Telephone Encounter (Signed)
Per Dr. Baxter Hire 1 dose --- 1x.

## 2016-04-20 NOTE — Telephone Encounter (Addendum)
Verbal order given by Dr. Carollee Herter for monurol and xylocaine. Repeat urine in one week.  If monurol does not help, go to the ER.  Coordinate care for catheter replacement.    Dr. Lorraine Lax pending.  Please advise on sig for monurol.

## 2016-04-20 NOTE — Addendum Note (Signed)
Addended by: Rudene Anda on: 04/20/2016 10:01 AM   Modules accepted: Orders

## 2016-04-20 NOTE — Telephone Encounter (Addendum)
Relation to GY:JEHU Call back number:[phone] Pharmacy: [facility] [place], [address] AT [facility] [address] [place] [phone] (Phone) [phone] (Fax     Reason for call:  Patient requesting "monurol" due to UTI patient states PCP has prescribed in the past and It works, patient states he has had the UTI for the last 4 days and would like Rx called in as soon as possible in addition patient is requesting lidocaine (XYLOCAINE) 2 % jelly, please advise patient when Rx is sent due to home health aid having to pick up Rx from pharmacy

## 2016-04-20 NOTE — Addendum Note (Signed)
Addended by: Rudene Anda on: 04/20/2016 09:41 AM   Modules accepted: Orders

## 2016-04-21 NOTE — Telephone Encounter (Signed)
INformed HHRN that medication is at the pharmacy and PCP wants urine sample rechecked in one week. She verbalized understanding and will take care of instructions.

## 2016-04-26 ENCOUNTER — Telehealth: Payer: Self-pay | Admitting: *Deleted

## 2016-04-26 ENCOUNTER — Encounter: Payer: Self-pay | Admitting: Family Medicine

## 2016-04-26 NOTE — Telephone Encounter (Signed)
Received signed Physician Orders from Dr. Carollee Herter.  Faxed orders to [facility] @ [phone]).  Confirmation received.//AB/CMA

## 2016-04-26 NOTE — Telephone Encounter (Signed)
Received signed Physician Orders from Dr. Carollee Herter.  Faxed orders to [facility] @ [phone]).  Confirmation received.//AB/CMA

## 2016-04-27 ENCOUNTER — Telehealth: Payer: Self-pay | Admitting: Family Medicine

## 2016-04-27 MED ORDER — CIPROFLOXACIN HCL 500 MG PO TABS: 500.0000 mg | ORAL_TABLET | Freq: Two times a day (BID) | ORAL | 0 refills | Status: DC

## 2016-04-27 NOTE — Telephone Encounter (Signed)
cipro 500 mg bid x 3 days --- per urine culture received today Then repeat urine

## 2016-04-27 NOTE — Telephone Encounter (Signed)
Spoke to the wife.  The patient told the RN this morning that he had a new bag, but truth was he did not.  Once the urine was taken they realized the mistake and the urine was the same urine previous urine was taken and had the infection. He now has a new bag and would like a urine test to be ordered again with the new bag.  Since PCP already approved will call [facility] to order. Called [facility] left message with Felicity Coyer to order test--[phone]

## 2016-04-27 NOTE — Telephone Encounter (Signed)
Called [facility] at [phone] to get the order for urine test. .

## 2016-04-27 NOTE — Telephone Encounter (Signed)
Caller name: Relationship to patient: Self Can be reached: [phone] Pharmacy:  [facility] [place], [address] AT [facility] [address] [place] [phone] (Phone) [phone] (Fax)     Reason for call: Patient request a call back about his UTI. Request another UA because he is still hurting. Need to call [facility] to have them come out to get the sample.

## 2016-05-01 ENCOUNTER — Other Ambulatory Visit: Payer: Self-pay | Admitting: Family Medicine

## 2016-05-01 MED ORDER — TRAMADOL HCL 50 MG PO TABS: 50.0000 mg | ORAL_TABLET | Freq: Three times a day (TID) | ORAL | 0 refills | Status: DC | PRN

## 2016-05-01 NOTE — Telephone Encounter (Signed)
Received Rx request for Tramadol 50mg  (take 1 tab po q8h prn)  Last Rf: 03/13/2016 Last Ov: 08/13/2015 No upcoming office visit scheduled at this time. UDS: None  Forwarded to the Provider for review, approval or denial.

## 2016-05-02 MED ORDER — TRAMADOL HCL 50 MG PO TABS: 50.0000 mg | ORAL_TABLET | Freq: Three times a day (TID) | ORAL | 0 refills | Status: DC | PRN

## 2016-05-02 NOTE — Telephone Encounter (Signed)
Faxed hard copy of Rx request to requested Pharmacy ([facility])

## 2016-05-02 NOTE — Addendum Note (Signed)
Addended by: Sharen Counter D on: 05/02/2016 08:59 AM   Modules accepted: Orders

## 2016-05-06 ENCOUNTER — Other Ambulatory Visit: Payer: Self-pay | Admitting: Family Medicine

## 2016-05-11 ENCOUNTER — Encounter: Payer: Self-pay | Admitting: *Deleted

## 2016-05-24 ENCOUNTER — Telehealth (HOSPITAL_COMMUNITY): Payer: Self-pay | Admitting: *Deleted

## 2016-05-24 NOTE — Telephone Encounter (Signed)
Patient called in asking if we can prescribe him something stronger for pain control with his next urinary catheter exchange.  He stated the 5 mg of oxycodone didn't help at all.   I explained to him that we do not typically prescribe pain medication as we are the heart failure clinic.  I advised him to contact his PCP or his urologist regarding pain medication during his catheter exchanges. He understands and no further questions at this time.

## 2016-06-01 ENCOUNTER — Other Ambulatory Visit: Payer: Self-pay | Admitting: Family Medicine

## 2016-06-22 ENCOUNTER — Other Ambulatory Visit (HOSPITAL_COMMUNITY): Payer: Self-pay | Admitting: Cardiology

## 2016-06-22 ENCOUNTER — Telehealth: Payer: Self-pay | Admitting: General Practice

## 2016-06-22 NOTE — Telephone Encounter (Signed)
Pt no longer sees Dr. Etter Sjogren for PCP

## 2016-06-25 ENCOUNTER — Other Ambulatory Visit: Payer: Self-pay | Admitting: Family Medicine

## 2016-06-30 ENCOUNTER — Other Ambulatory Visit: Payer: Self-pay | Admitting: Family Medicine

## 2016-06-30 NOTE — Telephone Encounter (Signed)
Faxed hardcopy for Tramadol to [facility] in [place]

## 2016-06-30 NOTE — Telephone Encounter (Signed)
Requesting:    Tramadol Contract     none UDS     none Last OV      08/13/15 Last Refill      #90 no refills on 3.6.2018  Please Advise

## 2016-07-03 ENCOUNTER — Other Ambulatory Visit: Payer: Self-pay | Admitting: Family Medicine

## 2016-07-05 ENCOUNTER — Other Ambulatory Visit: Payer: Self-pay | Admitting: Family Medicine

## 2016-07-06 ENCOUNTER — Other Ambulatory Visit: Payer: Self-pay | Admitting: Family Medicine

## 2016-07-20 ENCOUNTER — Other Ambulatory Visit: Payer: Self-pay | Admitting: Family Medicine

## 2016-07-20 NOTE — Telephone Encounter (Signed)
Last office visit 08/13/15

## 2016-07-27 ENCOUNTER — Other Ambulatory Visit (HOSPITAL_COMMUNITY): Payer: Self-pay | Admitting: Cardiology

## 2016-07-27 MED ORDER — TORSEMIDE 20 MG PO TABS: ORAL_TABLET | ORAL | 3 refills | Status: DC

## 2016-07-31 ENCOUNTER — Other Ambulatory Visit: Payer: Self-pay | Admitting: Family Medicine

## 2016-08-07 ENCOUNTER — Other Ambulatory Visit: Payer: Self-pay | Admitting: Family Medicine

## 2016-08-08 NOTE — Telephone Encounter (Signed)
Faxed hardcopy for tramadol to [facility] In [place]

## 2016-08-08 NOTE — Telephone Encounter (Signed)
Requesting:   tramadol Contract    none UDS    none Last OV    08/03/15 Last Refill    #90 no refills on 06/30/16  Please Advise

## 2016-08-13 ENCOUNTER — Other Ambulatory Visit: Payer: Self-pay | Admitting: Family Medicine

## 2016-08-18 ENCOUNTER — Other Ambulatory Visit (HOSPITAL_COMMUNITY): Payer: Self-pay | Admitting: *Deleted

## 2016-08-18 MED ORDER — CARVEDILOL 6.25 MG PO TABS: 6.2500 mg | ORAL_TABLET | Freq: Two times a day (BID) | ORAL | 3 refills | Status: DC

## 2016-09-04 ENCOUNTER — Other Ambulatory Visit: Payer: Self-pay | Admitting: Family Medicine

## 2016-09-26 ENCOUNTER — Other Ambulatory Visit: Payer: Self-pay | Admitting: Family Medicine

## 2016-09-28 ENCOUNTER — Other Ambulatory Visit: Payer: Self-pay | Admitting: Family Medicine

## 2016-10-05 ENCOUNTER — Telehealth (HOSPITAL_COMMUNITY): Payer: Self-pay

## 2016-10-05 MED ORDER — SPIRONOLACTONE 25 MG PO TABS: 12.5000 mg | ORAL_TABLET | Freq: Every day | ORAL | 1 refills | Status: DC

## 2016-10-05 NOTE — Telephone Encounter (Signed)
Patient called yesterday during epic downtime to have sp[facility]olactone refilled [facility]. In reviewing chart patient does not have spironolactone as an active medication on his chart. Called patient to verify whether he is taking this medication or not. Phone note from 10/27/15 stated patient had symptoms he thought to be contributing from spiro and Dr. Martyn Malay advised him to stop it at that time. Patient symptoms subsided and he kept taking it and has had it refilled up until this point. Advised per Dr. Aundra Dubin to continue doing his current regimen and will schedule him for follow up apt.  Renee Pain, RN

## 2016-10-16 ENCOUNTER — Other Ambulatory Visit: Payer: Self-pay | Admitting: Family Medicine

## 2016-10-20 ENCOUNTER — Telehealth (HOSPITAL_COMMUNITY): Payer: Self-pay | Admitting: Surgery

## 2016-10-20 NOTE — Telephone Encounter (Signed)
I received a call requesting Gary [name] be enrolled into the [facility] program from his wife.  He has had some recent misunderstanding regarding his medications according to notes and his wife.  I will send appropriate paperwork via secure email for referral to [facility].

## 2016-10-20 NOTE — Telephone Encounter (Signed)
Denied with note that pt needs appt/thx dmf

## 2016-10-23 ENCOUNTER — Ambulatory Visit (HOSPITAL_COMMUNITY)
Admission: RE | Admit: 2016-10-23 | Discharge: 2016-10-23 | Disposition: A | Discharge status: Home / Self Care | Payer: Medicare Other | Source: Ambulatory Visit | Attending: Cardiology | Admitting: Cardiology

## 2016-10-23 ENCOUNTER — Encounter (HOSPITAL_COMMUNITY): Payer: Self-pay | Admitting: Cardiology

## 2016-10-23 VITALS — BP 118/62 | HR 69 | Wt 196.5 lb

## 2016-10-23 DIAGNOSIS — I130 Hypertensive heart and chronic kidney disease with heart failure and stage 1 through stage 4 chronic kidney disease, or unspecified chronic kidney disease: Secondary | ICD-10-CM | POA: Insufficient documentation

## 2016-10-23 DIAGNOSIS — I5043 Acute on chronic combined systolic (congestive) and diastolic (congestive) heart failure: Secondary | ICD-10-CM

## 2016-10-23 DIAGNOSIS — Z951 Presence of aortocoronary bypass graft: Secondary | ICD-10-CM | POA: Diagnosis not present

## 2016-10-23 DIAGNOSIS — I482 Chronic atrial fibrillation: Secondary | ICD-10-CM | POA: Diagnosis not present

## 2016-10-23 DIAGNOSIS — Z79891 Long term (current) use of opiate analgesic: Secondary | ICD-10-CM | POA: Insufficient documentation

## 2016-10-23 DIAGNOSIS — I4821 Permanent atrial fibrillation: Secondary | ICD-10-CM

## 2016-10-23 DIAGNOSIS — I361 Nonrheumatic tricuspid (valve) insufficiency: Secondary | ICD-10-CM | POA: Insufficient documentation

## 2016-10-23 DIAGNOSIS — Z7901 Long term (current) use of anticoagulants: Secondary | ICD-10-CM | POA: Diagnosis not present

## 2016-10-23 DIAGNOSIS — I4901 Ventricular fibrillation: Secondary | ICD-10-CM

## 2016-10-23 DIAGNOSIS — R188 Other ascites: Secondary | ICD-10-CM | POA: Insufficient documentation

## 2016-10-23 DIAGNOSIS — Z79899 Other long term (current) drug therapy: Secondary | ICD-10-CM | POA: Insufficient documentation

## 2016-10-23 DIAGNOSIS — I5022 Chronic systolic (congestive) heart failure: Secondary | ICD-10-CM | POA: Insufficient documentation

## 2016-10-23 DIAGNOSIS — I5042 Chronic combined systolic (congestive) and diastolic (congestive) heart failure: Secondary | ICD-10-CM | POA: Diagnosis not present

## 2016-10-23 DIAGNOSIS — N183 Chronic kidney disease, stage 3 unspecified: Secondary | ICD-10-CM

## 2016-10-23 DIAGNOSIS — I2510 Atherosclerotic heart disease of native coronary artery without angina pectoris: Secondary | ICD-10-CM | POA: Insufficient documentation

## 2016-10-23 LAB — BASIC METABOLIC PANEL
Anion gap: 9 (ref 5–15)
BUN: 48 mg/dL — ABNORMAL HIGH (ref 6–20)
CALCIUM: 9.6 mg/dL (ref 8.9–10.3)
CO2: 32 mmol/L (ref 22–32)
CREATININE: 1.91 mg/dL — AB (ref 0.61–1.24)
Chloride: 94 mmol/L — ABNORMAL LOW (ref 101–111)
GFR calc non Af Amer: 30 mL/min — ABNORMAL LOW (ref >60)
GFR, EST AFRICAN AMERICAN: 35 mL/min — AB (ref >60)
Glucose, Bld: 114 mg/dL — ABNORMAL HIGH (ref 65–99)
Potassium: 5.1 mmol/L (ref 3.5–5.1)
SODIUM: 135 mmol/L (ref 135–145)

## 2016-10-23 MED ORDER — TRAMADOL HCL 50 MG PO TABS: 50.0000 mg | ORAL_TABLET | Freq: Three times a day (TID) | ORAL | 0 refills | Status: DC | PRN

## 2016-10-23 MED ORDER — LISINOPRIL 2.5 MG PO TABS: 2.5000 mg | ORAL_TABLET | Freq: Every day | ORAL | 3 refills | Status: DC

## 2016-10-23 NOTE — Patient Instructions (Addendum)
Labs drawn today  Start Lisinipril 2.5 mg (1 tab) daily  Your physician recommends that you return for lab work in: 10 day with [facility]  Refill of Tramadol was given 1 time please get refills from PCP  Your physician recommends that you schedule a follow-up appointment in: 4 months

## 2016-10-24 ENCOUNTER — Telehealth (HOSPITAL_COMMUNITY): Payer: Self-pay | Admitting: *Deleted

## 2016-10-24 NOTE — Telephone Encounter (Signed)
Basic Metabolic Panel (BMET)  Order: [id number]  Status:  Final result Visible to patient:  Yes (MyChart) Dx:  Acute on chronic systolic and diastol...  Notes recorded by Darron Doom, RN on 10/24/2016 at 2:51 PM EDT Patient is with [facility], not [facility]. I will fax prescription to them today to draw BMET in 10 days and fax [place] results. ------  Notes recorded by Darron Doom, RN on 10/24/2016 at 2:45 PM EDT Patient called back and verified that he is not taking any supplemental K. I'm mailing him educational handout for low K diet and [facility] will draw bmet in 10 days. ------  Notes recorded by Harvie Junior, CMA on 10/24/2016 at 12:15 PM EDT Spoke with patients wife she will have patient call back for lab results ------  Notes recorded by Larey Dresser, MD on 10/23/2016 at 11:01 PM EDT Low K diet, make sure no supplemental K. Make sure he gets BMET done again in 10 days.
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
## 2016-10-25 ENCOUNTER — Other Ambulatory Visit (HOSPITAL_COMMUNITY): Payer: Self-pay | Admitting: *Deleted

## 2016-10-25 MED ORDER — LISINOPRIL 2.5 MG PO TABS: 2.5000 mg | ORAL_TABLET | Freq: Every day | ORAL | 3 refills | Status: DC

## 2016-10-26 ENCOUNTER — Telehealth (HOSPITAL_COMMUNITY): Payer: Self-pay | Admitting: Surgery

## 2016-10-26 ENCOUNTER — Other Ambulatory Visit (HOSPITAL_COMMUNITY): Payer: Self-pay
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
## 2016-10-26 NOTE — Telephone Encounter (Signed)
Gary [name] with [facility] contacted clinic to let us know that patient is taking Saw Palmetto and Lexapro in addition to listed medications in chart.  He will call back with updated dosages and at that time I will add medications to his med list in EPIC.

## 2016-10-31 ENCOUNTER — Telehealth (HOSPITAL_COMMUNITY): Payer: Self-pay | Admitting: Surgery

## 2016-10-31 NOTE — Telephone Encounter (Signed)
Per Yevonne Aline --[facility] patient is taking Lexapro 10 mg daily and Saw Palmetto 450 mg 4 times per day.   I have added these updated medications to his med list.

## 2016-11-03 ENCOUNTER — Telehealth (HOSPITAL_COMMUNITY): Payer: Self-pay

## 2016-11-03 NOTE — Telephone Encounter (Signed)
Patient's HHRN called to report unable to obtain routine lab orders today. Will retry Monday morning.  Renee Pain, RN

## 2016-11-09 ENCOUNTER — Other Ambulatory Visit: Payer: Self-pay | Admitting: Family Medicine

## 2016-11-09 ENCOUNTER — Other Ambulatory Visit (HOSPITAL_COMMUNITY): Payer: Self-pay
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
## 2016-11-09 NOTE — Telephone Encounter (Signed)
Patient needs appt/has not been seen in over a year/thx dmf

## 2016-11-10 ENCOUNTER — Other Ambulatory Visit (HOSPITAL_COMMUNITY): Payer: Self-pay | Admitting: Cardiology

## 2016-11-22 ENCOUNTER — Other Ambulatory Visit (HOSPITAL_COMMUNITY): Payer: Self-pay

## 2016-11-22 NOTE — Progress Notes (Signed)
Paramedicine Encounter    Patient ID: Gary Lope Sr., male    DOB: 06-Aug-1929, 83 y.o.   MRN: [id number]   Patient Care Team: Rexene Agent, MD as Attending Physician (Nephrology)  Patient Active Problem List   Diagnosis Date Noted  . Anemia-transfused 07/15/14 07/18/2014  . CKD (chronic kidney disease), stage IV ([place]) 07/15/2014  . Acute on chronic systolic and diastolic heart failure, NYHA class 4 ([place]) 07/14/2014  . Long-term (current) use of anticoagulants 07/08/2014  . Right heart failure ([place]) 06/16/2013  . Valvular heart disease - moderate Gary, mild AI, severe TR 06/16/2013  . Pancreatic lesion 06/16/2013  . Finding of prostate - prominent by CT 05/2013 06/16/2013  . CKD (chronic kidney disease), stage III 06/16/2013  . Chronic combined systolic and diastolic heart failure ([place]) 01/15/2013  . CAD (coronary artery disease) 01/15/2013  . Other malaise and fatigue 07/01/2012  . Recurrent UTI 04/18/2011  . Hematuria 04/18/2011  . Cellulitis 07/13/2010  . LBBB (left bundle branch block) 06/16/2009  . CARDIOMYOPATHY, ISCHEMIC 06/17/2007  . Renal failure 03/18/2007  . Adjustment disorder with mixed anxiety and depressed mood 12/12/2006  . Permanent atrial fibrillation ([place]) 10/04/2006  . Hyperlipidemia 09/10/2006  . ANEMIA NOS 09/10/2006  . Essential hypertension-now hypotensive 09/10/2006  . GERD 09/10/2006  . URINARY INCONTINENCE 09/10/2006    Current Outpatient Prescriptions:  .  apixaban (ELIQUIS) 2.5 MG TABS tablet, Take 1 tablet (2.5 mg total) by mouth 2 (two) times daily., Disp: 180 tablet, Rfl: 3 .  atorvastatin (LIPITOR) 20 MG tablet, Take 1 tablet (20 mg total) by mouth daily., Disp: 90 tablet, Rfl: 3 .  carvedilol (COREG) 6.25 MG tablet, Take 1 tablet (6.25 mg total) by mouth 2 (two) times daily with a meal., Disp: 180 tablet, Rfl: 3 .  eplerenone (INSPRA) 25 MG tablet, Take 1 tablet (25 mg total) by mouth daily., Disp: 90 tablet, Rfl: 3 .  escitalopram  (LEXAPRO) 10 MG tablet, Take 10 mg by mouth daily., Disp: , Rfl:  .  ferrous sulfate 325 (65 FE) MG tablet, Take 325 mg by mouth daily with breakfast. , Disp: , Rfl:  .  folic acid (FOLVITE) 1 MG tablet, Take 1 mg by mouth daily.  , Disp: , Rfl:  .  Glucos-Chondroit-Hyaluron-MSM (GLUCOSAMINE CHONDROITIN JOINT PO), Take 1 tablet by mouth 2 (two) times daily., Disp: , Rfl:  .  lidocaine (XYLOCAINE) 2 % jelly, Place 1 application into the urethra daily as needed (pain)., Disp: 30 mL, Rfl: 1 .  lisinopril (PRINIVIL,ZESTRIL) 2.5 MG tablet, Take 1 tablet (2.5 mg total) by mouth daily., Disp: 30 tablet, Rfl: 3 .  Meth-Hyo-M Bl-Na Phos-Ph Sal (URIBEL) 118 MG CAPS, TAKE 1-4 CAPSULES BY MOUTH EVERY DAY AS DIRECTED, Disp: 360 capsule, Rfl: 0 .  mirtazapine (REMERON) 45 MG tablet, TAKE 1 TABLET BY MOUTH EVERY NIGHT AT BEDTIME, Disp: 30 tablet, Rfl: 0 .  Multiple Vitamin (MULTIVITAMIN) capsule, Take 1 capsule by mouth daily.  , Disp: , Rfl:  .  naftifine (NAFTIN) 1 % cream, APPLY EXTERNALLY TO THE AFFECTED AREA DAILY, Disp: 60 g, Rfl: 0 .  neomycin-bacitracin-polymyxin (NEOSPORIN) ointment, Apply 1 application topically daily as needed for wound care. , Disp: , Rfl:  .  nystatin-triamcinolone ointment (MYCOLOG), Apply topically 2 (two) times daily., Disp: 60 g, Rfl: 0 .  ondansetron (ZOFRAN) 8 MG tablet, TAKE 1 TABLET BY MOUTH EVERY 8 HOURS AS NEEDED FOR NAUSEA OR VOMITING, Disp: 90 tablet, Rfl: 0 .  OxyCODONE HCl, Abuse  Deter, (OXAYDO) 5 MG TABA, Take 5 mg by mouth every 30 (thirty) days. When your catheter is changed (Patient not taking: Reported on 11/09/2016), Disp: 6 tablet, Rfl: 0 .  promethazine (PHENERGAN) 25 MG tablet, Take 1 tablet (25 mg total) by mouth 4 (four) times daily as needed for nausea or vomiting. (Patient not taking: Reported on 10/26/2016), Disp: 30 tablet, Rfl: 0 .  Saw Palmetto 450 MG CAPS, Take by mouth QID., Disp: , Rfl:  .  torsemide (DEMADEX) 20 MG tablet, Take 2 tabs in AM and 4 tabs  in PM, Disp: 180 tablet, Rfl: 3 .  traMADol (ULTRAM) 50 MG tablet, Take 1 tablet (50 mg total) by mouth every 8 (eight) hours as needed., Disp: 60 tablet, Rfl: 0 .  triamcinolone cream (KENALOG) 0.1 %, AAA TWICE DAILY AS NEEDED. DO NOT USE ON FACE, GROIN OR UNDERARMS, Disp: 454 g, Rfl: 0 Allergies  Allergen Reactions  . Amoxicillin     REACTION: ha/nausea  . Diltiazem Hcl     REACTION: low bp  . Hydrocodone-Acetaminophen     REACTION: ha  . Niacin     REACTION: ha/nausea  . Niacin-Lovastatin Er     REACTION: ha/nausea  . Paroxetine     REACTION: ha/disoriented  . Propoxyphene N-Acetaminophen     REACTION: headache  . Pseudoephedrine     REACTION: stopped urine flow  . Spironolactone Itching  . Sulfonamide Derivatives     REACTION: ha/nausea  . Tolterodine Tartrate     REACTION: bladder stopped     Social History   Social History  . Marital status: Married    Spouse name: N/A  . Number of children: N/A  . Years of education: N/A   Occupational History  . Retired Retired    1994   Social History Main Topics  . Smoking status: Former Research scientist (life sciences)  . Smokeless tobacco: Never Used  . Alcohol use No  . Drug use: No  . Sexual activity: Not on file   Other Topics Concern  . Not on file   Social History Narrative  . No narrative on file    Physical Exam  Constitutional: He is oriented to person, place, and time.  Cardiovascular: Normal rate.   Pulmonary/Chest: Effort normal and breath sounds normal. No respiratory distress. He has no wheezes. He has no rales.  Abdominal: Soft.  Musculoskeletal: Normal range of motion. He exhibits no edema.  Neurological: He is alert and oriented to person, place, and time.  Skin: Skin is warm and dry.  Psychiatric: He has a normal mood and affect.        Future Appointments Date Time Provider [place]  02/14/2017 3:20 PM Larey Dresser, MD [facility] None   BP 110/62 (BP Location: Right Arm, Patient Position:  Sitting, Cuff Size: Large)   Pulse 73   Resp 16   Wt 190 lb (86.2 kg)   SpO2 96%   BMI 25.77 kg/m  Weight yesterday- 190 lbs  Gary [name] was seen at home today and reports feeling generally well. He denied SOB, dizziness and headache. He reports being compliant with his medications and fills his own pillbox. We discussed discharge from the paramedicine program and he stated that he feels comfortable with that. I will contact Daphne to have her make a note in his chart.  Time spent with patient: 60 minutes   Jacquiline Doe, EMT 11/22/16  ACTION: Home visit completed

## 2016-11-24 ENCOUNTER — Telehealth (HOSPITAL_COMMUNITY): Payer: Self-pay | Admitting: Surgery

## 2016-11-24 NOTE — Telephone Encounter (Signed)
Mr Hankerson will be discharged from the [facility].  He has met criteria for success independently.  He has been encouraged to contact the [facility] with any concerns or questions related to his HF.

## 2016-12-18 ENCOUNTER — Other Ambulatory Visit (HOSPITAL_COMMUNITY): Payer: Self-pay

## 2016-12-18 MED ORDER — APIXABAN 2.5 MG PO TABS: 2.5000 mg | ORAL_TABLET | Freq: Two times a day (BID) | ORAL | 3 refills | Status: DC

## 2017-01-22 ENCOUNTER — Other Ambulatory Visit (HOSPITAL_COMMUNITY): Payer: Self-pay | Admitting: *Deleted

## 2017-01-22 MED ORDER — ATORVASTATIN CALCIUM 20 MG PO TABS: 20.0000 mg | ORAL_TABLET | Freq: Every day | ORAL | 3 refills | Status: DC

## 2017-02-14 ENCOUNTER — Ambulatory Visit (HOSPITAL_COMMUNITY)
Admission: RE | Admit: 2017-02-14 | Discharge: 2017-02-14 | Disposition: A | Discharge status: Home / Self Care | Payer: Medicare Other | Source: Ambulatory Visit | Attending: Cardiology | Admitting: Cardiology

## 2017-02-14 VITALS — BP 110/58 | HR 68 | Wt 202.0 lb

## 2017-02-14 DIAGNOSIS — R188 Other ascites: Secondary | ICD-10-CM | POA: Diagnosis not present

## 2017-02-14 DIAGNOSIS — I252 Old myocardial infarction: Secondary | ICD-10-CM | POA: Diagnosis not present

## 2017-02-14 DIAGNOSIS — I255 Ischemic cardiomyopathy: Secondary | ICD-10-CM | POA: Insufficient documentation

## 2017-02-14 DIAGNOSIS — I2510 Atherosclerotic heart disease of native coronary artery without angina pectoris: Secondary | ICD-10-CM | POA: Diagnosis not present

## 2017-02-14 DIAGNOSIS — E785 Hyperlipidemia, unspecified: Secondary | ICD-10-CM | POA: Diagnosis not present

## 2017-02-14 DIAGNOSIS — Z8249 Family history of ischemic heart disease and other diseases of the circulatory system: Secondary | ICD-10-CM | POA: Insufficient documentation

## 2017-02-14 DIAGNOSIS — I482 Chronic atrial fibrillation: Secondary | ICD-10-CM | POA: Diagnosis not present

## 2017-02-14 DIAGNOSIS — N189 Chronic kidney disease, unspecified: Secondary | ICD-10-CM | POA: Diagnosis not present

## 2017-02-14 DIAGNOSIS — I5042 Chronic combined systolic (congestive) and diastolic (congestive) heart failure: Secondary | ICD-10-CM | POA: Diagnosis present

## 2017-02-14 DIAGNOSIS — I447 Left bundle-branch block, unspecified: Secondary | ICD-10-CM | POA: Diagnosis not present

## 2017-02-14 DIAGNOSIS — Z7901 Long term (current) use of anticoagulants: Secondary | ICD-10-CM | POA: Insufficient documentation

## 2017-02-14 DIAGNOSIS — Z79899 Other long term (current) drug therapy: Secondary | ICD-10-CM | POA: Diagnosis not present

## 2017-02-14 DIAGNOSIS — Z951 Presence of aortocoronary bypass graft: Secondary | ICD-10-CM | POA: Diagnosis not present

## 2017-02-14 DIAGNOSIS — I4821 Permanent atrial fibrillation: Secondary | ICD-10-CM

## 2017-02-14 DIAGNOSIS — I5022 Chronic systolic (congestive) heart failure: Secondary | ICD-10-CM | POA: Insufficient documentation

## 2017-02-14 DIAGNOSIS — I130 Hypertensive heart and chronic kidney disease with heart failure and stage 1 through stage 4 chronic kidney disease, or unspecified chronic kidney disease: Secondary | ICD-10-CM | POA: Diagnosis not present

## 2017-02-14 LAB — CBC
HCT: 38 % — ABNORMAL LOW (ref 39.0–52.0)
Hemoglobin: 12.2 g/dL — ABNORMAL LOW (ref 13.0–17.0)
MCH: 29.8 pg (ref 26.0–34.0)
MCHC: 32.1 g/dL (ref 30.0–36.0)
MCV: 92.9 fL (ref 78.0–100.0)
Platelets: 160 K/uL (ref 150–400)
RBC: 4.09 MIL/uL — ABNORMAL LOW (ref 4.22–5.81)
RDW: 13.9 % (ref 11.5–15.5)
WBC: 5.2 K/uL (ref 4.0–10.5)

## 2017-02-14 LAB — BASIC METABOLIC PANEL WITH GFR
Anion gap: 7 (ref 5–15)
BUN: 64 mg/dL — ABNORMAL HIGH (ref 6–20)
CO2: 28 mmol/L (ref 22–32)
Calcium: 9.2 mg/dL (ref 8.9–10.3)
Chloride: 96 mmol/L — ABNORMAL LOW (ref 101–111)
Creatinine, Ser: 2.47 mg/dL — ABNORMAL HIGH (ref 0.61–1.24)
GFR calc Af Amer: 26 mL/min — ABNORMAL LOW
GFR calc non Af Amer: 22 mL/min — ABNORMAL LOW
Glucose, Bld: 98 mg/dL (ref 65–99)
Potassium: 4.5 mmol/L (ref 3.5–5.1)
Sodium: 131 mmol/L — ABNORMAL LOW (ref 135–145)

## 2017-02-14 LAB — LIPID PANEL
Cholesterol: 87 mg/dL (ref 0–200)
HDL: 36 mg/dL — ABNORMAL LOW
LDL Cholesterol: 41 mg/dL (ref 0–99)
Total CHOL/HDL Ratio: 2.4 ratio
Triglycerides: 50 mg/dL
VLDL: 10 mg/dL (ref 0–40)

## 2017-02-14 NOTE — Patient Instructions (Signed)
Labs drawn today (if we do not call you, then your lab work was stable)   Your physician has requested that you have an echocardiogram. Echocardiography is a painless test that uses sound waves to create images of your heart. It provides your doctor with information about the size and shape of your heart and how well your heart's chambers and valves are working. This procedure takes approximately one hour. There are no restrictions for this procedure.  Your physician recommends that you schedule a follow-up appointment in: 3 months with Dr. Aundra Dubin  an a echocardiogram

## 2017-02-15 ENCOUNTER — Encounter (HOSPITAL_COMMUNITY): Payer: Self-pay

## 2017-02-15 ENCOUNTER — Telehealth (HOSPITAL_COMMUNITY): Payer: Self-pay

## 2017-02-15 DIAGNOSIS — I5022 Chronic systolic (congestive) heart failure: Secondary | ICD-10-CM

## 2017-02-15 MED ORDER — TORSEMIDE 20 MG PO TABS: ORAL_TABLET | ORAL | 3 refills | Status: DC

## 2017-02-15 NOTE — Telephone Encounter (Signed)
   Notes recorded by Shirley Muscat, RN on 02/15/2017 at 3:16 PM EST Pt aware of results, agreeable to med changes, faxed lab order to [facility] [phone]. Lipid Letter mailed to patient  ------  Notes recorded by Larey Dresser, MD on 02/15/2017 at 2:18 PM EST Creatinine higher, try decreasing torsemide to 40 qam/60 qpm with BMET 10 days.

## 2017-02-15 NOTE — Progress Notes (Addendum)
Patient ID: Gary [name]., male   DOB: 1929/04/11, 83 y.o.   MRN: [id number] PCP: [facility] Cardiology: Dr. Aundra Dubin  83 yo with history of CAD s/p CABG in 1995, chronic atrial fibrillation, CKD, and chronic diastolic CHF with prominent RV failure presents for CHF clinic evaluation.  Patient has struggled with right-sided failure. He was admitted in 5/16 with abdominal distention, volume overload, and elevated creatinine as high as 6.  Echo showed normal LV EF but RV was moderately dilated with moderate-severe TR, ?Ebsteins anomaly anatomy.  He had a paracentesis for ascites.  V/Q scan was negative for PE. He refused RHC while in the hospital.  He was diuresed and creatinine improved.  Repeat echo was done 6/17.  This showed EF down to 20-25% with regional wall motion abnormalities, moderately dilated RV, only mild TR with PASP 44 mmHg.  Lexiscan Cardiolite in 8/17 showed EF 39% with fixed defects, no ischemia.   Gary [name] returns for followup of CHF.  He is symptomatically stable.  Main complaint is low back and hip pain.  No significant exertional dyspnea though he is not very active.  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  No orthopnea/PND.  No chest pain.  Good BP today but SBP dips into 90s at times.  He denies lightheadedness.  He brings home weights, these are stable.   Labs (5/16): K 3.6, creatinine 6.15 => 2.47, LFTs normal, LDL 23, HCT 25.2 Labs (07/28/14): K 4.6, Creatinine 2.62, BNP 463 Labs (6/16): creatinine 2.23 => 2.01, K 4.4, BNP 227 Labs (8/16): LFTs normal Labs (12/16): K 4, creatinine 2.11, HCT 40.8 Labs (6/17): K 4.5, creatinine 1.6, LDL 37, HDL 30 Labs (8/17): K 4.2, creatinine 1.77, BNP 176 Labs (1/18): K 4.4, creatinine 1.75 Labs (9/18): K 5, creatinine 1.7  PMH:  1. CAD: MI 1983, CABG 1995.   - Lexiscan Cardiolite (8/17) with EF 39%, fixed inferior/inferoseptal defect and fixed apical septal/apical defect.  2. Chronic systolic failure with prominent RV failure:  ?Ischemic cardiomyopathy.  - Echo (5/16) with EF 55-60%, moderate LV hypertrophy, moderately dilated RV, massive biatrial enlargement, moderate-severe TR with some question of Ebstein's anomaly, PA systolic pressure 37 mmHg.  Patient has had ascites likely related to RV failure with h/o paracentesis.  - Echo (6/17) with EF 20-25%, regional wall motion abnormalities, mild Gary, moderately dilated RV with normal systolic function, mild TR (improved from prior), PASP 44 mmHg.  - Unable to tolerate spironolactone.  3. CKD 4. Permanent atrial fibrillation 5. LBBB 6. Hyperlipidemia 7. HTN 8. Complex pancreatic cyst: Followed by GI.  9. Neurogenic bladder: Chronic foley.  10. Anemia of renal disease.  11. Cholecystectomy 12. Appendectomy 13. Low back pain.   SH: Married, nonsmoker, 4 kids, lives with son.   FH: CAD  ROS: All systems reviewed and negative except as per HPI.   Current Outpatient Medications  Medication Sig Dispense Refill  . apixaban (ELIQUIS) 2.5 MG TABS tablet Take 1 tablet (2.5 mg total) by mouth 2 (two) times daily. 180 tablet 3  . atorvastatin (LIPITOR) 20 MG tablet Take 1 tablet (20 mg total) by mouth daily. 90 tablet 3  . carvedilol (COREG) 6.25 MG tablet Take 1 tablet (6.25 mg total) by mouth 2 (two) times daily with a meal. 180 tablet 3  . eplerenone (INSPRA) 25 MG tablet Take 1 tablet (25 mg total) by mouth daily. 90 tablet 3  . escitalopram (LEXAPRO) 10 MG tablet Take 10 mg by mouth daily.    Marland Kitchen  ferrous sulfate 325 (65 FE) MG tablet Take 325 mg by mouth daily with breakfast.     . folic acid (FOLVITE) 1 MG tablet Take 1 mg by mouth daily.      . Glucos-Chondroit-Hyaluron-MSM (GLUCOSAMINE CHONDROITIN JOINT PO) Take 1 tablet by mouth 2 (two) times daily.    Marland Kitchen lidocaine (XYLOCAINE) 2 % jelly Place 1 application into the urethra daily as needed (pain). 30 mL 1  . lisinopril (PRINIVIL,ZESTRIL) 2.5 MG tablet Take 1 tablet (2.5 mg total) by mouth daily. 30 tablet 3  .  Meth-Hyo-M Bl-Na Phos-Ph Sal (URIBEL) 118 MG CAPS TAKE 1-4 CAPSULES BY MOUTH EVERY DAY AS DIRECTED 360 capsule 0  . mirtazapine (REMERON) 45 MG tablet TAKE 1 TABLET BY MOUTH EVERY NIGHT AT BEDTIME 30 tablet 0  . Multiple Vitamin (MULTIVITAMIN) capsule Take 1 capsule by mouth daily.      . naftifine (NAFTIN) 1 % cream APPLY EXTERNALLY TO THE AFFECTED AREA DAILY 60 g 0  . neomycin-bacitracin-polymyxin (NEOSPORIN) ointment Apply 1 application topically daily as needed for wound care.     . nystatin-triamcinolone ointment (MYCOLOG) Apply topically 2 (two) times daily. 60 g 0  . ondansetron (ZOFRAN) 8 MG tablet TAKE 1 TABLET BY MOUTH EVERY 8 HOURS AS NEEDED FOR NAUSEA OR VOMITING 90 tablet 0  . OxyCODONE HCl, Abuse Deter, (OXAYDO) 5 MG TABA Take 5 mg by mouth every 30 (thirty) days. When your catheter is changed 6 tablet 0  . promethazine (PHENERGAN) 25 MG tablet Take 1 tablet (25 mg total) by mouth 4 (four) times daily as needed for nausea or vomiting. 30 tablet 0  . Saw Palmetto 450 MG CAPS Take by mouth QID.    Marland Kitchen torsemide (DEMADEX) 20 MG tablet Take 2 tabs in AM and 4 tabs in PM 180 tablet 3  . traMADol (ULTRAM) 50 MG tablet Take 1 tablet (50 mg total) by mouth every 8 (eight) hours as needed. 60 tablet 0  . triamcinolone cream (KENALOG) 0.1 % AAA TWICE DAILY AS NEEDED. DO NOT USE ON FACE, GROIN OR UNDERARMS 454 g 0   No current facility-administered medications for this encounter.    BP (!) 110/58   Pulse 68   Wt 202 lb (91.6 kg)   SpO2 98%   BMI 27.40 kg/m  General: NAD Neck: No JVD, no thyromegaly or thyroid nodule.  Lungs: Clear to auscultation bilaterally with normal respiratory effort. CV: Nondisplaced PMI.  Heart irregular S1/S2, no S3/S4, no murmur.  No peripheral edema.  No carotid bruit.  Normal pedal pulses.  Abdomen: Soft, nontender, no hepatosplenomegaly, no distention.  Skin: Intact without lesions or rashes.  Neurologic: Alert and oriented x 3.  Psych: Normal  affect. Extremities: No clubbing or cyanosis.  HEENT: Normal.   Assessment/Plan: 1. Atrial fibrillation: Permanent.  Markedly dilated atria on echo, unlikely to stay in NSR if DCCV were to be attempted. He is on warfarin.  HR is controlled.   2. Chronic systolic CHF with prominent RV failure:  In the past, echo had shown RV dysfunction but normal LV systolic function.  Most recent echo in 6/17, however, showed EF 20-25% with wall motion abnormalities and moderately dilated RV.  Concern for ischemic cardiomyopathy with worsening of coronary disease, but he has had no event consistent with MI (chest, arm or neck pain) and his symptoms have been very stable.  Lexiscan Cardiolite in 8/17 showed only fixed defects, no ischemia.  No evidence that he has tachycardia-mediated cardiomyopathy (rate has been  controlled).  NYHA class II symptoms, more limited by orthopedic issues.  Weight stable, he looks euvolemic. With SBP into the 90s at times, I will not increase his meds.  - Continue torsemide 40 qam/80 qpm. BMET today.    - Continue Coreg 6.25 mg bid. - Continue eplerenone (did not tolerate spironolactone).  - Continue lisinopril 2.5 daily.  - He is due for repeat echo, will arrange.  3. Tricuspid regurgitation: Moderate to severe TR on 2016.  There was question of possible Ebstein's anomaly on echo.  However, most recent echo in 6/17 showed only mild TR. He is getting repeat echo as above.   4. Ascites: Suspect this has been due hepatic congestion from RV failure.  Now resolved.  5. CKD: Creatinine stable recently.  Suspect cardiorenal component.  He is seeing Dr Joelyn Oms for nephrology.  HD would be difficult with RV failure. BMET today.  6. CAD: s/p CABG.  No chest pain.  Continue statin.  No ASA with use of warfarin and stable CAD.  Lexiscan Cardiolite in 8/17 showed areas of infarction with no ischemia, given CKD would hold off on Cardiac cath in absence of ischemia. - Check lipids today.    Followup  in 3 months   [name] 02/15/2017

## 2017-02-16 ENCOUNTER — Other Ambulatory Visit: Payer: Self-pay | Admitting: Family Medicine

## 2017-02-23 ENCOUNTER — Other Ambulatory Visit (HOSPITAL_COMMUNITY): Payer: Self-pay

## 2017-03-09 ENCOUNTER — Encounter: Payer: Self-pay | Admitting: Family Medicine

## 2017-03-09 ENCOUNTER — Ambulatory Visit: Payer: Medicare Other | Admitting: Family Medicine

## 2017-03-09 VITALS — BP 138/80 | HR 68 | Ht 72.0 in | Wt 201.0 lb

## 2017-03-09 DIAGNOSIS — F32A Depression, unspecified: Secondary | ICD-10-CM

## 2017-03-09 DIAGNOSIS — M1990 Unspecified osteoarthritis, unspecified site: Secondary | ICD-10-CM

## 2017-03-09 DIAGNOSIS — M130 Polyarthritis, unspecified: Secondary | ICD-10-CM | POA: Insufficient documentation

## 2017-03-09 DIAGNOSIS — F329 Major depressive disorder, single episode, unspecified: Secondary | ICD-10-CM | POA: Diagnosis not present

## 2017-03-09 DIAGNOSIS — F419 Anxiety disorder, unspecified: Secondary | ICD-10-CM | POA: Diagnosis not present

## 2017-03-09 DIAGNOSIS — N319 Neuromuscular dysfunction of bladder, unspecified: Secondary | ICD-10-CM | POA: Diagnosis not present

## 2017-03-09 HISTORY — DX: Polyarthritis, unspecified: M13.0

## 2017-03-09 HISTORY — DX: Depression, unspecified: F32.A

## 2017-03-09 HISTORY — DX: Neuromuscular dysfunction of bladder, unspecified: N31.9

## 2017-03-09 MED ORDER — TRAMADOL HCL 50 MG PO TABS: 50.0000 mg | ORAL_TABLET | Freq: Three times a day (TID) | ORAL | 2 refills | Status: DC | PRN

## 2017-03-09 MED ORDER — LIDOCAINE HCL 2 % EX GEL: 1.0000 "application " | Freq: Every day | CUTANEOUS | 12 refills | Status: AC | PRN

## 2017-03-09 NOTE — Progress Notes (Signed)
Subjective:  Patient ID: Gary Rolls., male    DOB: 1929/08/26  Age: 84 y.o. MRN: [id number]  CC: Establish Care   HPI Gary [name]. presents for establishment of care.  He is seeing cardiology for ischemic cardiomyopathy, chronic systolic and diastolic heart failure class IV, and atrial fibrillation.  At this time he denies shortness of breath swelling in his legs or any irregular heartbeat.  He also has a permanent indwelling catheter for neurogenic bladder that is changed every 30 days.  He has visiting home health check on him for this problem on a weekly basis.  He is currently doing his own flushes.  He has arthritis in his hips and hands and uses brand for this on a as needed basis.  He also takes 2 Ultram before each catheter change.  He is taking Remeron for anxiety and depression.  He lives with his wife who I am seeing with him today.They both live with their son.   Outpatient Medications Prior to Visit  Medication Sig Dispense Refill  . apixaban (ELIQUIS) 2.5 MG TABS tablet Take 1 tablet (2.5 mg total) by mouth 2 (two) times daily. 180 tablet 3  . atorvastatin (LIPITOR) 20 MG tablet Take 1 tablet (20 mg total) by mouth daily. 90 tablet 3  . carvedilol (COREG) 6.25 MG tablet Take 6.25 mg by mouth 2 (two) times daily with a meal.    . eplerenone (INSPRA) 25 MG tablet Take 25 mg by mouth daily.    . ferrous sulfate 325 (65 FE) MG tablet Take 325 mg by mouth daily with breakfast.     . folic acid (FOLVITE) 1 MG tablet Take 1 mg by mouth daily.      . Glucos-Chondroit-Hyaluron-MSM (GLUCOSAMINE CHONDROITIN JOINT PO) Take 1 tablet by mouth 2 (two) times daily.    . Meth-Hyo-M Bl-Na Phos-Ph Sal (URIBEL) 118 MG CAPS TAKE 1-4 CAPSULES BY MOUTH EVERY DAY AS DIRECTED 360 capsule 0  . mirtazapine (REMERON) 45 MG tablet TAKE 1 TABLET BY MOUTH EVERY NIGHT AT BEDTIME 30 tablet 0  . Multiple Vitamin (MULTIVITAMIN) capsule Take 1 capsule by mouth daily.      . naftifine (NAFTIN) 1 %  cream APPLY EXTERNALLY TO THE AFFECTED AREA DAILY 60 g 0  . neomycin-bacitracin-polymyxin (NEOSPORIN) ointment Apply 1 application topically daily as needed for wound care.     . nystatin-triamcinolone ointment (MYCOLOG) Apply topically 2 (two) times daily. 60 g 0  . ondansetron (ZOFRAN) 8 MG tablet TAKE 1 TABLET BY MOUTH EVERY 8 HOURS AS NEEDED FOR NAUSEA OR VOMITING 90 tablet 0  . OxyCODONE HCl, Abuse Deter, (OXAYDO) 5 MG TABA Take 5 mg by mouth every 30 (thirty) days. When your catheter is changed 6 tablet 0  . promethazine (PHENERGAN) 25 MG tablet Take 1 tablet (25 mg total) by mouth 4 (four) times daily as needed for nausea or vomiting. 30 tablet 0  . Saw Palmetto 450 MG CAPS Take by mouth QID.    Marland Kitchen torsemide (DEMADEX) 20 MG tablet Take 2 tabs in AM and 3 tabs in PM 180 tablet 3  . triamcinolone cream (KENALOG) 0.1 % AAA TWICE DAILY AS NEEDED. DO NOT USE ON FACE, GROIN OR UNDERARMS 454 g 0  . escitalopram (LEXAPRO) 10 MG tablet Take 10 mg by mouth daily.    Marland Kitchen lidocaine (XYLOCAINE) 2 % jelly Place 1 application into the urethra daily as needed (pain). 30 mL 1  . traMADol (ULTRAM) 50 MG tablet  Take 1 tablet (50 mg total) by mouth every 8 (eight) hours as needed. 60 tablet 0  . lisinopril (PRINIVIL,ZESTRIL) 2.5 MG tablet Take 1 tablet (2.5 mg total) by mouth daily. 30 tablet 3  . carvedilol (COREG) 6.25 MG tablet Take 1 tablet (6.25 mg total) by mouth 2 (two) times daily with a meal. 180 tablet 3  . eplerenone (INSPRA) 25 MG tablet Take 1 tablet (25 mg total) by mouth daily. 90 tablet 3   No facility-administered medications prior to visit.     ROS Review of Systems  Constitutional: Negative.   HENT: Negative.   Eyes: Negative.   Respiratory: Negative for shortness of breath.   Cardiovascular: Negative for chest pain, palpitations and leg swelling.  Gastrointestinal: Negative.   Musculoskeletal: Positive for arthralgias.  Neurological: Negative.   Hematological: Negative.     Psychiatric/Behavioral: Negative for dysphoric mood. The patient is not nervous/anxious.     Objective:  BP 138/80 (BP Location: Left Arm, Patient Position: Sitting, Cuff Size: Normal)   Pulse 68   Ht 6' (1.829 m)   Wt 201 lb (91.2 kg)   SpO2 94%   BMI 27.26 kg/m   BP Readings from Last 3 Encounters:  03/09/17 138/80  02/14/17 (!) 110/58  11/22/16 110/62    Wt Readings from Last 3 Encounters:  03/09/17 201 lb (91.2 kg)  02/14/17 202 lb (91.6 kg)  11/22/16 190 lb (86.2 kg)    Physical Exam  Constitutional: He is oriented to person, place, and time. He appears well-developed and well-nourished. No distress.  HENT:  Head: Normocephalic and atraumatic.  Right Ear: External ear normal.  Left Ear: External ear normal.  Mouth/Throat: Oropharynx is clear and moist. No oropharyngeal exudate.  Eyes: Conjunctivae are normal. Pupils are equal, round, and reactive to light. Right eye exhibits no discharge. Left eye exhibits no discharge. No scleral icterus.  Neck: Neck supple. No JVD present. No tracheal deviation present. No thyromegaly present.  Cardiovascular: Normal rate, regular rhythm and normal heart sounds.  Pulmonary/Chest: Effort normal and breath sounds normal. No stridor.  Lymphadenopathy:    He has no cervical adenopathy.  Neurological: He is alert and oriented to person, place, and time.  Skin: Skin is warm and dry. He is not diaphoretic.  Psychiatric: He has a normal mood and affect. His behavior is normal.    Lab Results  Component Value Date   WBC 5.2 02/14/2017   HGB 12.2 (L) 02/14/2017   HCT 38.0 (L) 02/14/2017   PLT 160 02/14/2017   GLUCOSE 98 02/14/2017   CHOL 87 02/14/2017   TRIG 50 02/14/2017   HDL 36 (L) 02/14/2017   LDLCALC 41 02/14/2017   ALT 20 08/13/2015   AST 21 08/13/2015   NA 131 (L) 02/14/2017   K 4.5 02/14/2017   CL 96 (L) 02/14/2017   CREATININE 2.47 (H) 02/14/2017   BUN 64 (H) 02/14/2017   CO2 28 02/14/2017   TSH 2.428 07/14/2014    PSA 13.12 (H) 10/20/2011   INR 2.1 12/24/2014    No results found.  Assessment & Plan:   Gary [name] was seen today for establish care.  Diagnoses and all orders for this visit:  Arthritis -     traMADol (ULTRAM) 50 MG tablet; Take 1 tablet (50 mg total) by mouth every 8 (eight) hours as needed.  Neurogenic bladder disorder -     lidocaine (XYLOCAINE) 2 % jelly; Place 1 application into the urethra daily as needed (pain). -  traMADol (ULTRAM) 50 MG tablet; Take 1 tablet (50 mg total) by mouth every 8 (eight) hours as needed.  Anxiety  Depression, unspecified depression type   I have discontinued [name]'s escitalopram. I am also having him maintain his multivitamin, folic acid, ferrous sulfate, Glucos-Chondroit-Hyaluron-MSM (GLUCOSAMINE CHONDROITIN JOINT PO), neomycin-bacitracin-polymyxin, OxyCODONE HCl (Abuse Deter), promethazine, nystatin-triamcinolone ointment, URIBEL, triamcinolone cream, mirtazapine, ondansetron, naftifine, lisinopril, Saw Palmetto, apixaban, atorvastatin, torsemide, carvedilol, eplerenone, lidocaine, and traMADol.  Meds ordered this encounter  Medications  . lidocaine (XYLOCAINE) 2 % jelly    Sig: Place 1 application into the urethra daily as needed (pain).    Dispense:  30 mL    Refill:  12  . traMADol (ULTRAM) 50 MG tablet    Sig: Take 1 tablet (50 mg total) by mouth every 8 (eight) hours as needed.    Dispense:  60 tablet    Refill:  2     Follow-up: Return in about 2 months (around 05/07/2017).  Libby Maw, MD

## 2017-03-14 ENCOUNTER — Telehealth: Payer: Self-pay | Admitting: Family Medicine

## 2017-03-14 NOTE — Telephone Encounter (Signed)
Patient requesting refill of Naftifine, last filled on 09/26/16, last OV 03/09/17.

## 2017-03-14 NOTE — Telephone Encounter (Signed)
Copied from [place] [id number]. Topic: Quick Communication - See Telephone Encounter >> Mar 14, 2017 10:54 AM Hewitt Shorts wrote: CRM for notification. See Telephone encounter for: pt is needing a refill on naftitine   Best number [phone]  03/14/17  Pharmacy is [facility] [address] .[phone]

## 2017-03-14 NOTE — Telephone Encounter (Signed)
Patient received Rx for tinea corporis from Dr. Etter Sjogren at his [facility] on 02/12/15. Okay to send refill in?

## 2017-03-15 MED ORDER — NAFTIFINE HCL 1 % EX CREA: TOPICAL_CREAM | CUTANEOUS | 1 refills | Status: DC

## 2017-03-15 NOTE — Telephone Encounter (Signed)
Rx sent 

## 2017-03-18 IMAGING — NM NM PULMONARY VENT & PERF
16 series · 16 of 16 positions shown · non-contrast
Comparison: None; correlation chest radiograph 07/15/2014

CLINICAL DATA: Chronic persistent atrial fibrillation on Coumadin,
coronary disease post CABG, LEFT ventricular dysfunction, now with
RIGHT-side failure/ventricular dysfunction and dilatation, question
chronic thromboembolic disease

EXAM:
NUCLEAR MEDICINE VENTILATION - PERFUSION LUNG SCAN
TECHNIQUE: Ventilation images were obtained in multiple projections using
inhaled aerosol Nc-VVm DTPA. Perfusion images were obtained in
multiple projections after intravenous injection of Nc-VVm MAA.
RADIOPHARMACEUTICALS:  40 mCi 5echnetium-EEm DTPA aerosol inhalation
and 6 mCi 5echnetium-EEm MAA IV

[Series 1: wbr_bone_60 ant/post vent · 4.14mm/px · 1 of 1 slices shown (1 of 2)]
[im 1/1]
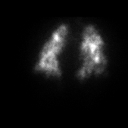

[Series 1: wbr_bone_60 ant/post vent · 4.14mm/px · 1 of 1 slices shown (2 of 2)]
[im 1/1]
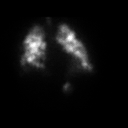

[Series 2: wbr_bone_60 lao/rpo vent · 4.14mm/px · 1 of 1 slices shown (1 of 2)]
[im 1/1]
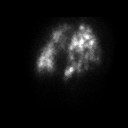

[Series 2: wbr_bone_60 lao/rpo vent · 4.14mm/px · 1 of 1 slices shown (2 of 2)]
[im 1/1]
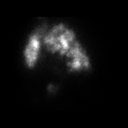

[Series 3: wbr_bone_60 lpo/rao vent · 4.14mm/px · 1 of 1 slices shown (1 of 2)]
[im 1/1]
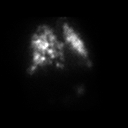

[Series 3: wbr_bone_60 lpo/rao vent · 4.14mm/px · 1 of 1 slices shown (2 of 2)]
[im 1/1]
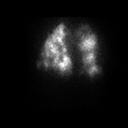

[Series 4: wbr_bone_60 lt lat/rt lat vent · 4.14mm/px · 1 of 1 slices shown (1 of 2)]
[im 1/1]
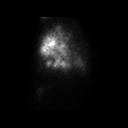

[Series 4: wbr_bone_60 lt lat/rt lat vent · 4.14mm/px · 1 of 1 slices shown (2 of 2)]
[im 1/1]
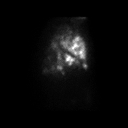

[Series 5: wbr_bone_60 lt lat/rt lat perf · 4.14mm/px · 1 of 1 slices shown (1 of 2)]
[im 1/1]
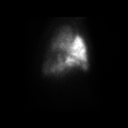

[Series 5: wbr_bone_60 lt lat/rt lat perf · 4.14mm/px · 1 of 1 slices shown (2 of 2)]
[im 1/1]
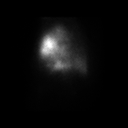

[Series 6: wbr_bone_60 lpo/rao perf · 4.14mm/px · 1 of 1 slices shown (1 of 2)]
[im 1/1]
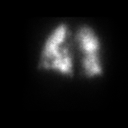

[Series 6: wbr_bone_60 lpo/rao perf · 4.14mm/px · 1 of 1 slices shown (2 of 2)]
[im 1/1]
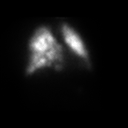

[Series 7: wbr_bone_60 ant/post perf · 4.14mm/px · 1 of 1 slices shown (1 of 2)]
[im 1/1]
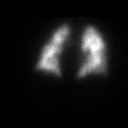

[Series 7: wbr_bone_60 ant/post perf · 4.14mm/px · 1 of 1 slices shown (2 of 2)]
[im 1/1]
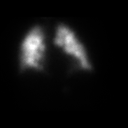

[Series 8: wbr_bone_60 lao/rpo perf · 4.14mm/px · 1 of 1 slices shown (1 of 2)]
[im 1/1]
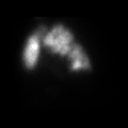

[Series 8: wbr_bone_60 lao/rpo perf · 4.14mm/px · 1 of 1 slices shown (2 of 2)]
[im 1/1]
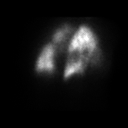

[16 of 16 positions shown; findings below may reference images not displayed]

FINDINGS: Ventilation: Abnormal appearance with numerous subsegmental
ventilation defects throughout both lungs greatest at lateral aspect
of mid to lower RIGHT lung in both the RIGHT middle and RIGHT lower
lobes. Mild elevation of RIGHT diaphragm and enlargement of cardiac
silhouette.

Perfusion: Diffuse irregularity of perfusion throughout the
periphery of both lungs with scattered tiny subsegmental perfusion
defects, few RN less severe than the defects noted on the
ventilation exam. Largest perfusion defect is seen at the RIGHT
lower lobe matching the ventilatory finding. Reversal of MAA
gradient greater anteriorly than posteriorly suggesting pulmonary
arterial hypertension.

Chest radiograph: Enlargement of cardiac silhouette post CABG.
Elevation of RIGHT diaphragm with underlying emphysematous changes
but no acute infiltrate.
IMPRESSION: Markedly abnormal ventilation and to a lesser degree perfusion lung
scans with multiple small peripheral subsegmental ventilatory and
fewer perfusion defects throughout both lungs, greatest at lateral
lower RIGHT lung, likely the result of parenchymal disease/COPD.

No perfusion defects for which ventilation appears normal.

Findings represent a low probability for pulmonary embolism.

Reversal of perfusion gradient suggestive of pulmonary arterial
hypertension.

## 2017-03-18 IMAGING — US US ABDOMEN COMPLETE
1 series · 13 of 25 positions shown · non-contrast
Comparison: 06/13/2013.  CT, 06/12/2013

CLINICAL DATA: Right heart failure, assess the degree of ascites.
Known pancreatic cystic lesion. History of a cholecystectomy and
hypertension.

EXAM:
ULTRASOUND ABDOMEN COMPLETE

[Series 1: us abdomen complete · 0.24mm/px · 13 of 77 slices shown]
[im 1/77]
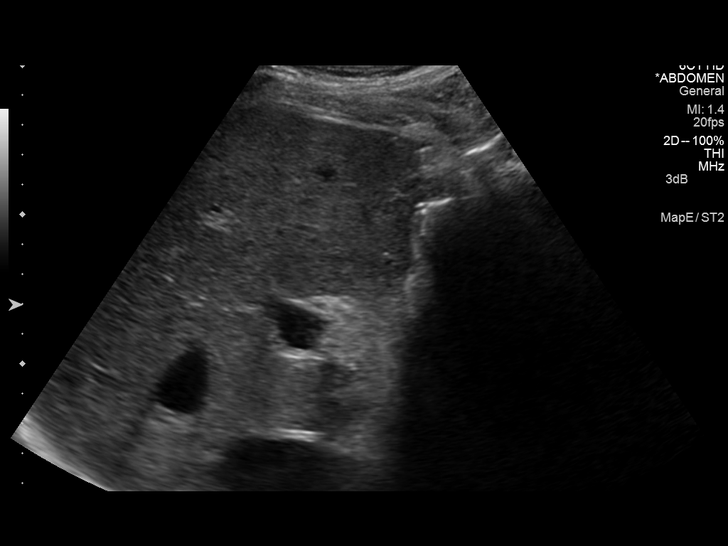
[im 7/77]
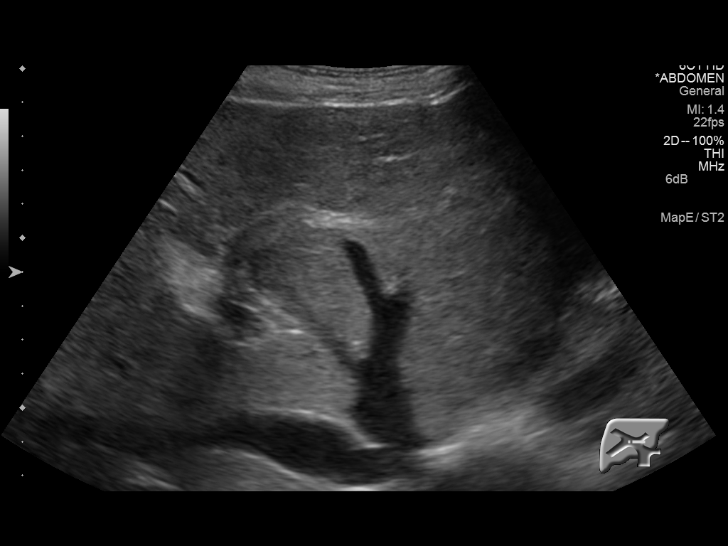
[im 13/77]
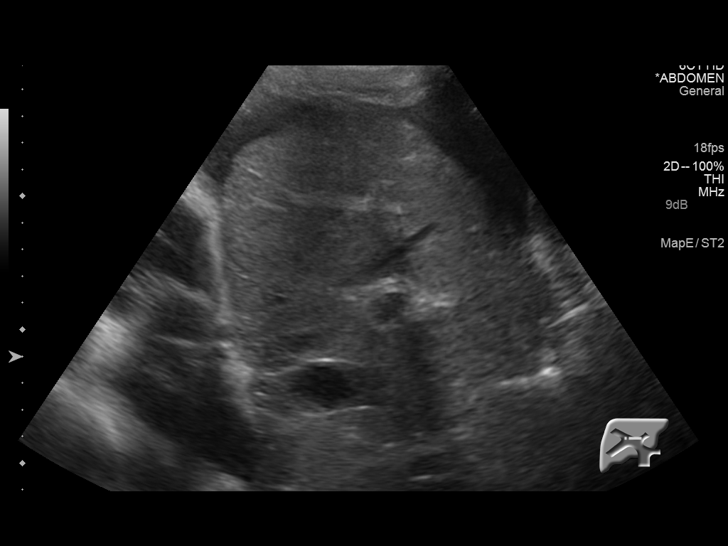
[im 20/77]
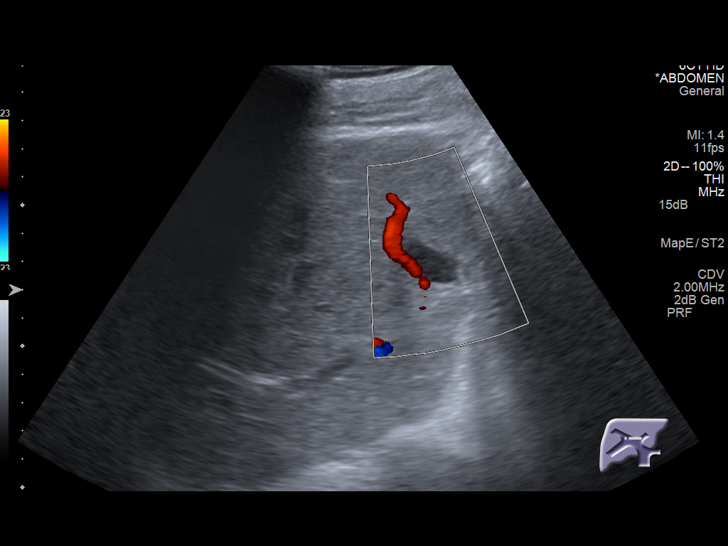
[im 26/77]
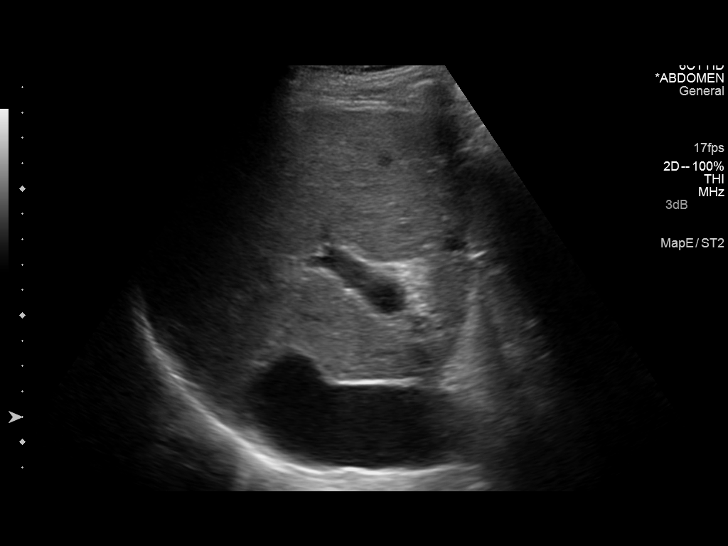
[im 32/77]
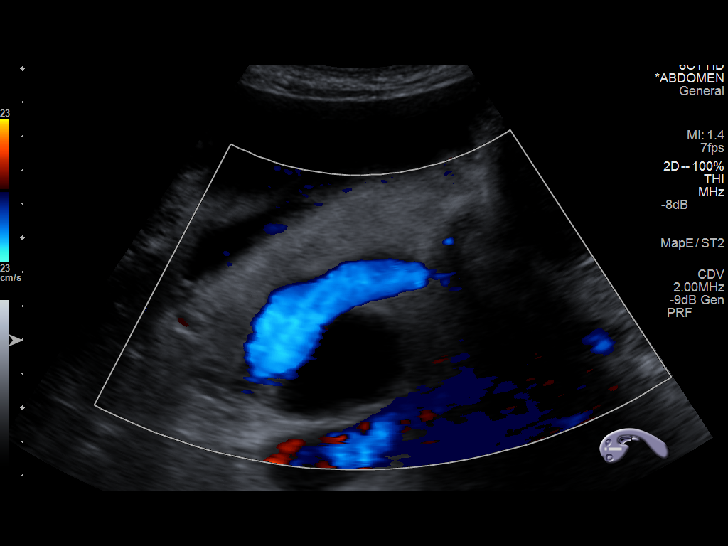
[im 39/77]
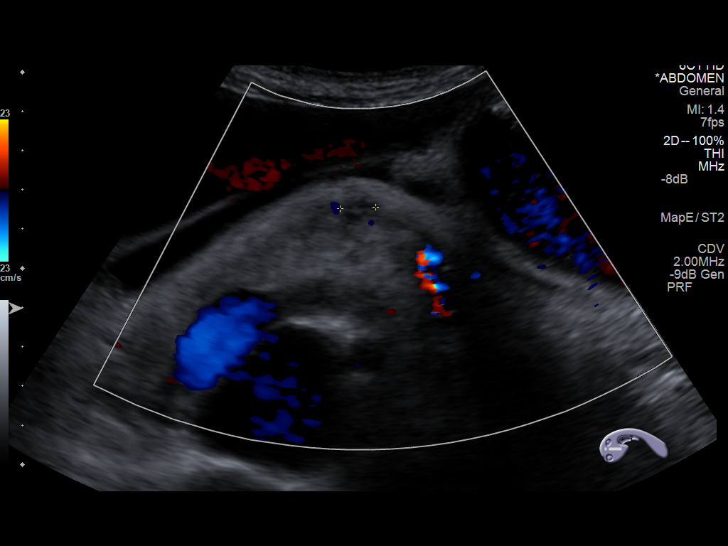
[im 45/77]
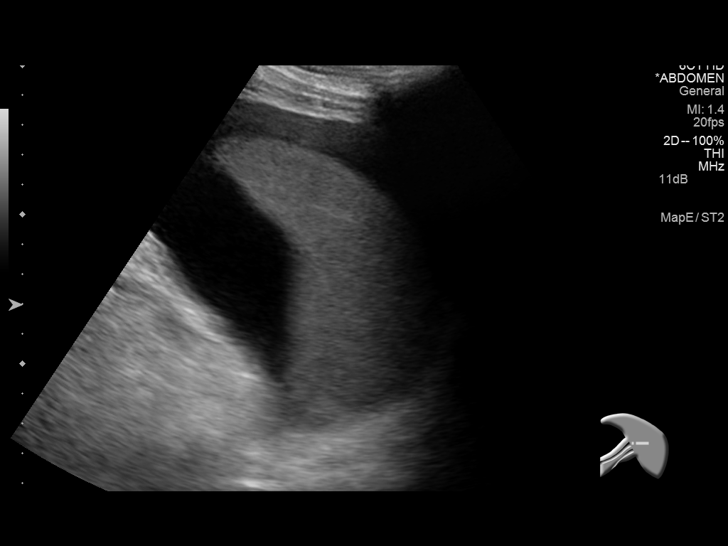
[im 51/77]
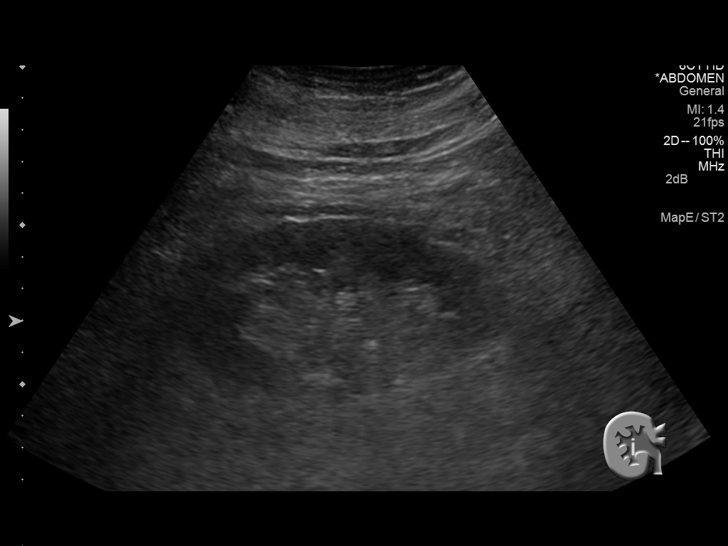
[im 58/77]
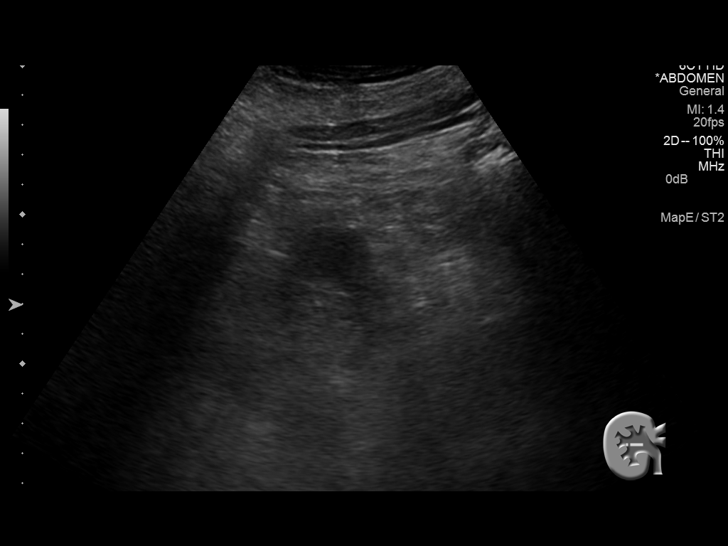
[im 64/77]
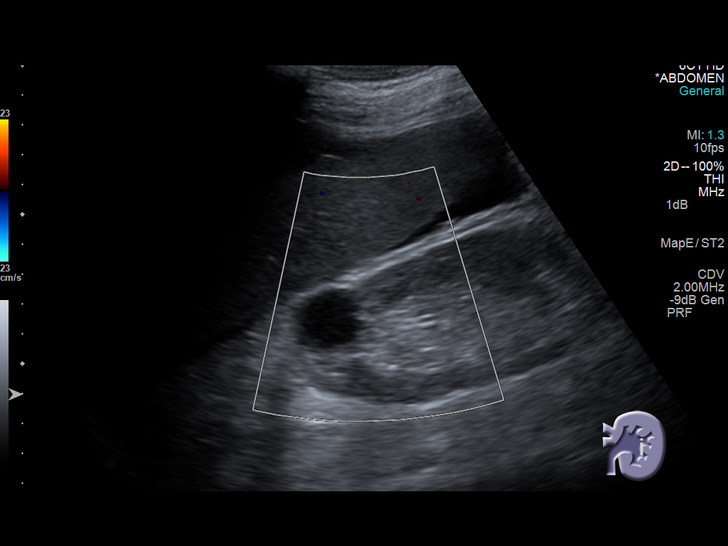
[im 70/77]
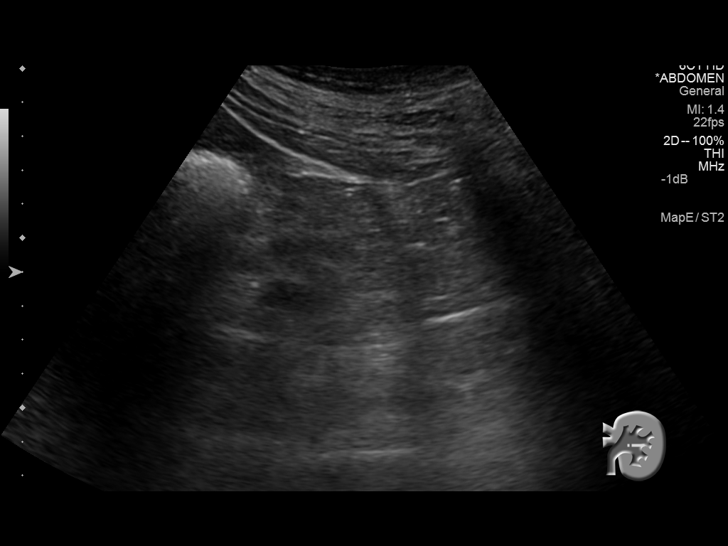
[im 77/77]
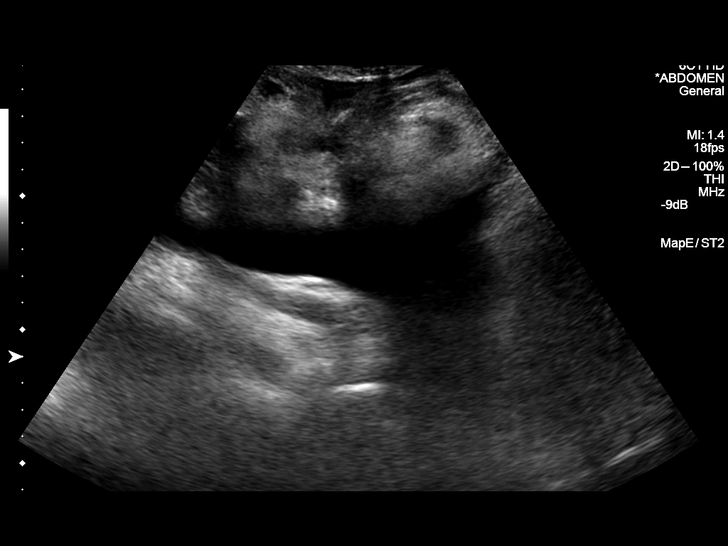

[13 of 25 positions shown; findings below may reference images not displayed]

FINDINGS: Gallbladder: Surgically absent

Common bile duct: Diameter: 3.6 mm

Liver: Fluid collection in the right lobe measuring 23 mm x 18 mm.
No other liver lesion. Normal liver parenchymal echogenicity.
Hepatopetal flow demonstrated in the portal vein.

IVC: Distended but widely patent.

Pancreas: Cyst or focal fluid collection adjacent to the uncinate
process or arising from it measuring 4.3 cm x 3.7 cm x 4.3 cm. Small
hypoechoic or cystic lesion in the body measuring 9 mm. No other
pancreatic lesions.

Spleen: Size and appearance within normal limits.

Right Kidney: Length: 11.0 cm. Echogenicity within normal limits. No
mass or hydronephrosis visualized.

Left Kidney: Length: 11.4 cm. 2.6 cm upper pole simple cyst. 16 mm
lower pole cyst. No other masses or lesions. Normal parenchymal
echogenicity. No hydronephrosis.

Abdominal aorta: No aneurysm visualized.

Other findings: Small amount of ascites, significantly decreased
from the previous day.
IMPRESSION: 1. Significant decrease in ascites, now small amount.
2. 2 cystic areas either within or adjacent to the pancreas. One
small area appears to lie within the pancreatic body measuring 9 mm.
A more simple appearing cyst or focal fluid collection hilar lies
adjacent to or arises from the onset crosses the pancreas.
3. Small liver cyst. Small renal cysts on the left. Status post
cholecystectomy. No other abnormalities.

## 2017-03-19 ENCOUNTER — Ambulatory Visit: Payer: Medicare Other | Admitting: Family Medicine

## 2017-03-19 ENCOUNTER — Encounter: Payer: Self-pay | Admitting: Family Medicine

## 2017-03-19 VITALS — BP 128/80 | Ht 72.0 in | Wt 201.0 lb

## 2017-03-19 DIAGNOSIS — M130 Polyarthritis, unspecified: Secondary | ICD-10-CM | POA: Diagnosis not present

## 2017-03-19 NOTE — Progress Notes (Signed)
Subjective:  Patient ID: Gary Rolls., male    DOB: 1929-05-20  Age: 84 y.o. MRN: [id number]  CC: Follow-up   HPI Gary WINSETT Sr. presents for follow-up of his arthritis involving his neck shoulder and lower left extremity.  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   he tells me that his pain is not always relieved with the tramadol.  He is taking the Tylenol sporadically.  Significant past medical history to include hypertension, ischemic cardiomyopathy, chronic atrial fibrillation, congestive heart failure, chronic kidney disease, GERD, anticoagulant use and neurogenic bladder.  He requests a prescription for Percocet.  He is here with his wife did today who is receiving a higher dose of Percocet and a referral to pain management.  Outpatient Medications Prior to Visit  Medication Sig Dispense Refill  . apixaban (ELIQUIS) 2.5 MG TABS tablet Take 1 tablet (2.5 mg total) by mouth 2 (two) times daily. 180 tablet 3  . atorvastatin (LIPITOR) 20 MG tablet Take 1 tablet (20 mg total) by mouth daily. 90 tablet 3  . carvedilol (COREG) 6.25 MG tablet Take 6.25 mg by mouth 2 (two) times daily with a meal.    . eplerenone (INSPRA) 25 MG tablet Take 25 mg by mouth daily.    . ferrous sulfate 325 (65 FE) MG tablet Take 325 mg by mouth daily with breakfast.     . folic acid (FOLVITE) 1 MG tablet Take 1 mg by mouth daily.      . Glucos-Chondroit-Hyaluron-MSM (GLUCOSAMINE CHONDROITIN JOINT PO) Take 1 tablet by mouth 2 (two) times daily.    Marland Kitchen lidocaine (XYLOCAINE) 2 % jelly Place 1 application into the urethra daily as needed (pain). 30 mL 12  . Meth-Hyo-M Bl-Na Phos-Ph Sal (URIBEL) 118 MG CAPS TAKE 1-4 CAPSULES BY MOUTH EVERY DAY AS DIRECTED 360 capsule 0  . mirtazapine (REMERON) 45 MG tablet TAKE 1 TABLET BY MOUTH EVERY NIGHT AT BEDTIME 30 tablet 0  . Multiple Vitamin (MULTIVITAMIN) capsule Take 1 capsule by mouth daily.      . naftifine (NAFTIN) 1 % cream  APPLY EXTERNALLY TO THE AFFECTED AREA DAILY 60 g 1  . neomycin-bacitracin-polymyxin (NEOSPORIN) ointment Apply 1 application topically daily as needed for wound care.     . nystatin-triamcinolone ointment (MYCOLOG) Apply topically 2 (two) times daily. 60 g 0  . ondansetron (ZOFRAN) 8 MG tablet TAKE 1 TABLET BY MOUTH EVERY 8 HOURS AS NEEDED FOR NAUSEA OR VOMITING 90 tablet 0  . OxyCODONE HCl, Abuse Deter, (OXAYDO) 5 MG TABA Take 5 mg by mouth every 30 (thirty) days. When your catheter is changed 6 tablet 0  . promethazine (PHENERGAN) 25 MG tablet Take 1 tablet (25 mg total) by mouth 4 (four) times daily as needed for nausea or vomiting. 30 tablet 0  . Saw Palmetto 450 MG CAPS Take by mouth QID.    Marland Kitchen torsemide (DEMADEX) 20 MG tablet Take 2 tabs in AM and 3 tabs in PM 180 tablet 3  . traMADol (ULTRAM) 50 MG tablet Take 1 tablet (50 mg total) by mouth every 8 (eight) hours as needed. 60 tablet 2  . triamcinolone cream (KENALOG) 0.1 % AAA TWICE DAILY AS NEEDED. DO NOT USE ON FACE, GROIN OR UNDERARMS 454 g 0  . lisinopril (PRINIVIL,ZESTRIL) 2.5 MG tablet Take 1 tablet (2.5 mg total) by mouth daily. 30 tablet 3   No facility-administered medications prior to visit.     ROS  Review of Systems  Constitutional: Negative.   Respiratory: Negative for chest tightness and shortness of breath.   Cardiovascular: Negative for chest pain and palpitations.  Gastrointestinal: Negative.   Musculoskeletal: Positive for arthralgias, neck pain and neck stiffness.  Hematological: Negative.   Psychiatric/Behavioral: Negative.     Objective:  BP 128/80 (BP Location: Left Arm, Patient Position: Sitting, Cuff Size: Normal)   Ht 6' (1.829 m)   Wt 201 lb (91.2 kg)   BMI 27.26 kg/m   BP Readings from Last 3 Encounters:  03/19/17 128/80  03/09/17 138/80  02/14/17 (!) 110/58    Wt Readings from Last 3 Encounters:  03/19/17 201 lb (91.2 kg)  03/09/17 201 lb (91.2 kg)  02/14/17 202 lb (91.6 kg)    Physical  Exam  Constitutional: He is oriented to person, place, and time. He appears well-developed and well-nourished. No distress.  HENT:  Head: Normocephalic and atraumatic.  Right Ear: External ear normal.  Left Ear: External ear normal.  Eyes: Right eye exhibits no discharge. Left eye exhibits no discharge. No scleral icterus.  Neck: No JVD present. No tracheal deviation present.  Pulmonary/Chest: Effort normal. No stridor.  Neurological: He is alert and oriented to person, place, and time.  Skin: Skin is warm and dry. He is not diaphoretic.  Psychiatric: He has a normal mood and affect. His behavior is normal.    Lab Results  Component Value Date   WBC 5.2 02/14/2017   HGB 12.2 (L) 02/14/2017   HCT 38.0 (L) 02/14/2017   PLT 160 02/14/2017   GLUCOSE 98 02/14/2017   CHOL 87 02/14/2017   TRIG 50 02/14/2017   HDL 36 (L) 02/14/2017   LDLCALC 41 02/14/2017   ALT 20 08/13/2015   AST 21 08/13/2015   NA 131 (L) 02/14/2017   K 4.5 02/14/2017   CL 96 (L) 02/14/2017   CREATININE 2.47 (H) 02/14/2017   BUN 64 (H) 02/14/2017   CO2 28 02/14/2017   TSH 2.428 07/14/2014   PSA 13.12 (H) 10/20/2011   INR 2.1 12/24/2014    No results found.  Assessment & Plan:   Gary [name] was seen today for follow-up.  Diagnoses and all orders for this visit:  Arthritis of multiple sites -     Ambulatory referral to Sports Medicine   I am having Gary Lope Sr. maintain his multivitamin, folic acid, ferrous sulfate, Glucos-Chondroit-Hyaluron-MSM (GLUCOSAMINE CHONDROITIN JOINT PO), neomycin-bacitracin-polymyxin, OxyCODONE HCl (Abuse Deter), promethazine, nystatin-triamcinolone ointment, URIBEL, triamcinolone cream, mirtazapine, ondansetron, lisinopril, Saw Palmetto, apixaban, atorvastatin, torsemide, carvedilol, eplerenone, lidocaine, traMADol, and naftifine.  No orders of the defined types were placed in this encounter.  Suggested that the patient take Tylenol with his tramadol.  We will see what  sports medicine might have to offer this patient.  Follow-up: No Follow-up on file.  Libby Maw, MD

## 2017-03-30 ENCOUNTER — Telehealth: Payer: Self-pay

## 2017-03-30 NOTE — Telephone Encounter (Signed)
Please advise on if there is anything patient can take for the depression.

## 2017-03-30 NOTE — Telephone Encounter (Signed)
Dr. Ethelene Hal - [facility] is requesting verbal orders for foley catheter changes every 60 days.  Patient is also requesting a different medication for his depression due to Mrs. Luiz being placed in Hospice. He declined an appointment because he wants to be by her side.  Copied from [place] [id number]. Topic: Quick Communication - See Telephone Encounter >> Mar 30, 2017  1:44 PM Oneta Rack wrote: CRM for notification. See Telephone encounter for:   03/30/17.  Caller name: Relation to pt: RN from [facility]  Call back number: [phone]  :  Reason for call:  Requesting verbal orders for foley catheter changes every 60 day.   Patient wife has been placed in hospice, patient depressed requesting Rx to cope with depression stating the current medication is not working, declined appointment due to wife being hospice and wanting to be with his wife, please advise  >> Mar 30, 2017  2:06 PM Oneta Rack wrote: CRM for notification. See Telephone encounter for:   03/30/17.  Caller name: Relation to pt: RN from [facility]  Call back number: [phone]  :  Reason for call:  Requesting verbal orders for foley catheter changes every 60 day.   Patient wife has been placed in hospice, patient depressed requesting Rx to cope with depression stating the current medication is not working, declined appointment due to wife being hospice and wanting to be with his wife, please advise

## 2017-03-30 NOTE — Telephone Encounter (Signed)
Chart review shows that catheter should be changed every 1-2 mos. Catheter changes every 2 mos for now.

## 2017-04-02 NOTE — Telephone Encounter (Signed)
I called and spoke with the RN from [facility]. Verbal orders given and she will make patient aware that he would have to come in before any medications can be given for depression.

## 2017-04-02 NOTE — Telephone Encounter (Signed)
Needs to come in to discuss that.

## 2017-04-06 ENCOUNTER — Encounter (HOSPITAL_BASED_OUTPATIENT_CLINIC_OR_DEPARTMENT_OTHER): Payer: Self-pay | Admitting: *Deleted

## 2017-04-06 ENCOUNTER — Emergency Department (HOSPITAL_BASED_OUTPATIENT_CLINIC_OR_DEPARTMENT_OTHER)
Admission: EM | Admit: 2017-04-06 | Discharge: 2017-04-06 | Disposition: A | Discharge status: Home / Self Care | Payer: Medicare Other | Attending: Emergency Medicine | Admitting: Emergency Medicine

## 2017-04-06 ENCOUNTER — Other Ambulatory Visit: Payer: Self-pay

## 2017-04-06 ENCOUNTER — Emergency Department (HOSPITAL_BASED_OUTPATIENT_CLINIC_OR_DEPARTMENT_OTHER): Payer: Medicare Other

## 2017-04-06 DIAGNOSIS — Z7901 Long term (current) use of anticoagulants: Secondary | ICD-10-CM | POA: Insufficient documentation

## 2017-04-06 DIAGNOSIS — Z79899 Other long term (current) drug therapy: Secondary | ICD-10-CM | POA: Diagnosis not present

## 2017-04-06 DIAGNOSIS — I130 Hypertensive heart and chronic kidney disease with heart failure and stage 1 through stage 4 chronic kidney disease, or unspecified chronic kidney disease: Secondary | ICD-10-CM | POA: Diagnosis not present

## 2017-04-06 DIAGNOSIS — E860 Dehydration: Secondary | ICD-10-CM | POA: Diagnosis not present

## 2017-04-06 DIAGNOSIS — N184 Chronic kidney disease, stage 4 (severe): Secondary | ICD-10-CM | POA: Diagnosis not present

## 2017-04-06 DIAGNOSIS — R531 Weakness: Secondary | ICD-10-CM | POA: Diagnosis not present

## 2017-04-06 DIAGNOSIS — Z951 Presence of aortocoronary bypass graft: Secondary | ICD-10-CM | POA: Insufficient documentation

## 2017-04-06 DIAGNOSIS — I5042 Chronic combined systolic (congestive) and diastolic (congestive) heart failure: Secondary | ICD-10-CM | POA: Diagnosis not present

## 2017-04-06 DIAGNOSIS — Z87891 Personal history of nicotine dependence: Secondary | ICD-10-CM | POA: Insufficient documentation

## 2017-04-06 DIAGNOSIS — I2510 Atherosclerotic heart disease of native coronary artery without angina pectoris: Secondary | ICD-10-CM | POA: Insufficient documentation

## 2017-04-06 LAB — CBC WITH DIFFERENTIAL/PLATELET
Basophils Absolute: 0 10*3/uL (ref 0.0–0.1)
Basophils Relative: 0 %
EOS ABS: 0.1 10*3/uL (ref 0.0–0.7)
EOS PCT: 2 %
HCT: 35.6 % — ABNORMAL LOW (ref 39.0–52.0)
Hemoglobin: 11.5 g/dL — ABNORMAL LOW (ref 13.0–17.0)
LYMPHS ABS: 0.8 10*3/uL (ref 0.7–4.0)
LYMPHS PCT: 11 %
MCH: 30.2 pg (ref 26.0–34.0)
MCHC: 32.3 g/dL (ref 30.0–36.0)
MCV: 93.4 fL (ref 78.0–100.0)
MONO ABS: 0.7 10*3/uL (ref 0.1–1.0)
MONOS PCT: 9 %
Neutro Abs: 6.1 10*3/uL (ref 1.7–7.7)
Neutrophils Relative %: 78 %
PLATELETS: 163 10*3/uL (ref 150–400)
RBC: 3.81 MIL/uL — ABNORMAL LOW (ref 4.22–5.81)
RDW: 13.5 % (ref 11.5–15.5)
WBC: 7.7 10*3/uL (ref 4.0–10.5)

## 2017-04-06 LAB — URINALYSIS, ROUTINE W REFLEX MICROSCOPIC
BILIRUBIN URINE: NEGATIVE
GLUCOSE, UA: NEGATIVE mg/dL
KETONES UR: NEGATIVE mg/dL
NITRITE: NEGATIVE
Protein, ur: NEGATIVE mg/dL
SPECIFIC GRAVITY, URINE: 1.01 (ref 1.005–1.030)
pH: 8 (ref 5.0–8.0)

## 2017-04-06 LAB — INFLUENZA PANEL BY PCR (TYPE A & B)
Influenza A By PCR: NEGATIVE
Influenza B By PCR: NEGATIVE

## 2017-04-06 LAB — COMPREHENSIVE METABOLIC PANEL
ALBUMIN: 3.9 g/dL (ref 3.5–5.0)
ALK PHOS: 59 U/L (ref 38–126)
ALT: 13 U/L — AB (ref 17–63)
AST: 19 U/L (ref 15–41)
Anion gap: 9 (ref 5–15)
BUN: 84 mg/dL — AB (ref 6–20)
CALCIUM: 9.4 mg/dL (ref 8.9–10.3)
CHLORIDE: 92 mmol/L — AB (ref 101–111)
CO2: 31 mmol/L (ref 22–32)
CREATININE: 2.72 mg/dL — AB (ref 0.61–1.24)
GFR calc Af Amer: 23 mL/min — ABNORMAL LOW (ref >60)
GFR calc non Af Amer: 20 mL/min — ABNORMAL LOW (ref >60)
GLUCOSE: 109 mg/dL — AB (ref 65–99)
Potassium: 4.7 mmol/L (ref 3.5–5.1)
SODIUM: 132 mmol/L — AB (ref 135–145)
Total Bilirubin: 1.1 mg/dL (ref 0.3–1.2)
Total Protein: 7.4 g/dL (ref 6.5–8.1)

## 2017-04-06 LAB — URINALYSIS, MICROSCOPIC (REFLEX)

## 2017-04-06 LAB — TROPONIN I: Troponin I: <0.03 ng/mL (ref <0.03)

## 2017-04-06 LAB — I-STAT CG4 LACTIC ACID, ED: Lactic Acid, Venous: 0.56 mmol/L (ref 0.5–1.9)

## 2017-04-06 LAB — PROTIME-INR
INR: 1.26
PROTHROMBIN TIME: 15.7 s — AB (ref 11.4–15.2)

## 2017-04-06 MED ORDER — SODIUM CHLORIDE 0.9 % IV BOLUS (SEPSIS)
500.0000 mL | Freq: Once | INTRAVENOUS | Status: AC
  Administered 2017-04-06: 500 mL via INTRAVENOUS

## 2017-04-06 MED ORDER — ONDANSETRON HCL 4 MG/2ML IJ SOLN
4.0000 mg | Freq: Once | INTRAMUSCULAR | Status: AC
  Administered 2017-04-06: 4 mg via INTRAVENOUS
  Filled 2017-04-06: qty 2

## 2017-04-06 NOTE — ED Notes (Signed)
Pt remains on cardiac monitor and auto VS 

## 2017-04-06 NOTE — ED Notes (Signed)
Pt on cardiac monitor and auto VS 

## 2017-04-06 NOTE — ED Triage Notes (Signed)
Weak, confused, pale, not drinking fluids per son. He has an indwelling foley.

## 2017-04-06 NOTE — ED Notes (Signed)
Pt ambulated in hall. Noted to have steady, even gate.

## 2017-04-06 NOTE — ED Provider Notes (Signed)
[facility] EMERGENCY DEPARTMENT Provider Note   CSN: [id number] Arrival date & time: 04/06/17  1238     History   Chief Complaint Chief Complaint  Patient presents with  . Weakness    HPI Gary [name]. is a 84 y.o. male with history of CHF, CAD, hypertension, neurogenic bladder with indwelling Foley catheter, atrial fibrillation anticoagulated on Eliquis who presents with a 2-3-day history of weakness, generalized fatigue, decreased appetite and fluid intake.  Patient's sons noticed he seemed confused today.  He has been mentally and physically exhausted from taking care of his wife at home.  His son reports he has been sleep deprived.  Patient admits that he has not been drinking very much water at home.  He reports some nausea and dry heaving, but no abdominal pain or diarrhea.  He denies any pain.  He denies any chest pain, shortness of breath.  His Foley catheter is changed at least quarterly, per his sons.  HPI  Past Medical History:  Diagnosis Date  . Anemia   . Anxiety   . Arrhythmia    afib  . CHF (congestive heart failure) ([place])   . Chronic systolic heart failure (HCC)    a. prior EF 20-30%;  b. Echo 7/09: EF 35-40%, basal inferoposterior HK, mild AI, mild to moderate MR, moderate to marked LAE, mild to moderate TR, mildly increased PASP, moderate RAE;   c. Echo 01/2012:   mild LVH, EF 50%, Tr AI, mild MR, severe LAE, mod to severe RAE, mod to severe RVE, mod reduced RVSF, mod to severe TR, PASP 46  . Coronary artery disease   . GERD (gastroesophageal reflux disease)   . Heart attack ([place])    1983  . Hyperlipidemia   . Hypertension   . Neurogenic bladder    has had bladder catheter changes at the urology office every 12 weeks  . Renal failure   . Warfarin anticoagulation     Patient Active Problem List   Diagnosis Date Noted  . Arthritis of multiple sites 03/09/2017  . Neurogenic bladder disorder 03/09/2017  . Depression 03/09/2017  . Anxiety  03/09/2017  . Anemia-transfused 07/15/14 07/18/2014  . CKD (chronic kidney disease), stage IV ([place]) 07/15/2014  . Acute on chronic systolic and diastolic heart failure, NYHA class 4 ([place]) 07/14/2014  . Long-term (current) use of anticoagulants 07/08/2014  . Right heart failure ([place]) 06/16/2013  . Valvular heart disease - moderate MR, mild AI, severe TR 06/16/2013  . Pancreatic lesion 06/16/2013  . Finding of prostate - prominent by CT 05/2013 06/16/2013  . CKD (chronic kidney disease), stage III ([place]) 06/16/2013  . Chronic combined systolic and diastolic heart failure ([place]) 01/15/2013  . CAD (coronary artery disease) 01/15/2013  . Other malaise and fatigue 07/01/2012  . Recurrent UTI 04/18/2011  . Hematuria 04/18/2011  . Cellulitis 07/13/2010  . LBBB (left bundle branch block) 06/16/2009  . CARDIOMYOPATHY, ISCHEMIC 06/17/2007  . Renal failure 03/18/2007  . Adjustment disorder with mixed anxiety and depressed mood 12/12/2006  . Permanent atrial fibrillation ([place]) 10/04/2006  . Hyperlipidemia 09/10/2006  . ANEMIA NOS 09/10/2006  . Essential hypertension-now hypotensive 09/10/2006  . GERD 09/10/2006  . URINARY INCONTINENCE 09/10/2006    Past Surgical History:  Procedure Laterality Date  . APPENDECTOMY    . CHOLECYSTECTOMY     1971  . CORONARY ARTERY BYPASS GRAFT     1995       Home Medications    Prior to Admission medications  Medication Sig Start Date End Date Taking? Authorizing Provider  apixaban (ELIQUIS) 2.5 MG TABS tablet Take 1 tablet (2.5 mg total) by mouth 2 (two) times daily. 12/18/16   Larey Dresser, MD  atorvastatin (LIPITOR) 20 MG tablet Take 1 tablet (20 mg total) by mouth daily. 01/22/17   Larey Dresser, MD  carvedilol (COREG) 6.25 MG tablet Take 6.25 mg by mouth 2 (two) times daily with a meal.    [provider]  eplerenone (INSPRA) 25 MG tablet Take 25 mg by mouth daily.    [provider]  ferrous sulfate 325 (65 FE) MG tablet  Take 325 mg by mouth daily with breakfast.     [provider]  folic acid (FOLVITE) 1 MG tablet Take 1 mg by mouth daily.      [provider]  Glucos-Chondroit-Hyaluron-MSM (GLUCOSAMINE CHONDROITIN JOINT PO) Take 1 tablet by mouth 2 (two) times daily.    [provider]  lidocaine (XYLOCAINE) 2 % jelly Place 1 application into the urethra daily as needed (pain). 03/09/17   Libby Maw, MD  lisinopril (PRINIVIL,ZESTRIL) 2.5 MG tablet Take 1 tablet (2.5 mg total) by mouth daily. 10/25/16 02/14/17  Larey Dresser, MD  Meth-Hyo-M Bl-Na Phos-Ph Sal (URIBEL) 118 MG CAPS TAKE 1-4 CAPSULES BY MOUTH EVERY DAY AS DIRECTED 07/06/16   Carollee Herter, Alferd Apa, DO  mirtazapine (REMERON) 45 MG tablet TAKE 1 TABLET BY MOUTH EVERY NIGHT AT BEDTIME 07/20/16   Ann Held, DO  Multiple Vitamin (MULTIVITAMIN) capsule Take 1 capsule by mouth daily.      [provider]  naftifine (NAFTIN) 1 % cream APPLY EXTERNALLY TO THE AFFECTED AREA DAILY 03/15/17   Libby Maw, MD  neomycin-bacitracin-polymyxin (NEOSPORIN) ointment Apply 1 application topically daily as needed for wound care.     [provider]  nystatin-triamcinolone ointment (MYCOLOG) Apply topically 2 (two) times daily. 04/11/16   Carollee Herter, Yvonne R, DO  ondansetron (ZOFRAN) 8 MG tablet TAKE 1 TABLET BY MOUTH EVERY 8 HOURS AS NEEDED FOR NAUSEA OR VOMITING 07/31/16   Carollee Herter, Alferd Apa, DO  OxyCODONE HCl, Abuse Deter, (OXAYDO) 5 MG TABA Take 5 mg by mouth every 30 (thirty) days. When your catheter is changed 03/14/16   Larey Dresser, MD  promethazine (PHENERGAN) 25 MG tablet Take 1 tablet (25 mg total) by mouth 4 (four) times daily as needed for nausea or vomiting. 04/11/16   Carollee Herter, Alferd Apa, DO  Saw Palmetto 450 MG CAPS Take by mouth QID.    [provider]  torsemide (DEMADEX) 20 MG tablet Take 2 tabs in AM and 3 tabs in PM 02/15/17   Larey Dresser, MD  traMADol  (ULTRAM) 50 MG tablet Take 1 tablet (50 mg total) by mouth every 8 (eight) hours as needed. 03/09/17   Libby Maw, MD  triamcinolone cream (KENALOG) 0.1 % AAA TWICE DAILY AS NEEDED. DO NOT USE ON FACE, GROIN OR UNDERARMS 07/06/16   Ann Held, DO    Family History Family History  Problem Relation Age of Onset  . Cancer Mother   . Heart attack Father        deceased at 22, smoking, ETOH abuse  . Breast cancer Sister     Social History Social History   Tobacco Use  . Smoking status: Former Research scientist (life sciences)  . Smokeless tobacco: Never Used  Substance Use Topics  . Alcohol use: No  . Drug use: No  Allergies   Amoxicillin; Diltiazem hcl; Hydrocodone-acetaminophen; Niacin; Niacin-lovastatin er; Paroxetine; Propoxyphene n-acetaminophen; Pseudoephedrine; Spironolactone; Sulfonamide derivatives; and Tolterodine tartrate   Review of Systems Review of Systems  Constitutional: Negative for chills and fever.  HENT: Negative for facial swelling and sore throat.   Respiratory: Negative for shortness of breath.   Cardiovascular: Negative for chest pain.  Gastrointestinal: Positive for nausea and vomiting (dry heaving). Negative for abdominal pain.  Genitourinary: Positive for difficulty urinating (indwelling foley).  Musculoskeletal: Negative for back pain.  Skin: Negative for rash and wound.  Neurological: Positive for weakness. Negative for dizziness, light-headedness and headaches.  Psychiatric/Behavioral: Positive for confusion. The patient is not nervous/anxious.      Physical Exam Updated Vital Signs BP (!) 101/56 (BP Location: Right Arm)   Pulse 60   Temp 98 F (36.7 C) (Oral)   Resp 16   Ht 6\' 2"  (1.88 m)   Wt 90.7 kg (200 lb)   SpO2 98%   BMI 25.68 kg/m   Physical Exam  Constitutional: He is oriented to person, place, and time. He appears well-developed and well-nourished. No distress.  HENT:  Head: Normocephalic and atraumatic.  Mouth/Throat:  Oropharynx is clear and moist. No oropharyngeal exudate.  Eyes: Conjunctivae are normal. Pupils are equal, round, and reactive to light. Right eye exhibits no discharge. Left eye exhibits no discharge. No scleral icterus.  Neck: Normal range of motion. Neck supple. No thyromegaly present.  Cardiovascular: Normal rate, regular rhythm, normal heart sounds and intact distal pulses. Exam reveals no gallop and no friction rub.  No murmur heard. Pulmonary/Chest: Effort normal and breath sounds normal. No stridor. No respiratory distress. He has no wheezes. He has no rales.  Abdominal: Soft. Bowel sounds are normal. He exhibits no distension. There is no tenderness. There is no rebound and no guarding.  Musculoskeletal: He exhibits no edema.  Lymphadenopathy:    He has no cervical adenopathy.  Neurological: He is alert and oriented to person, place, and time. Coordination normal.  Skin: Skin is warm and dry. No rash noted. He is not diaphoretic. There is pallor.  Psychiatric: He has a normal mood and affect.  Nursing note and vitals reviewed.    ED Treatments / Results  Labs (all labs ordered are listed, but only abnormal results are displayed) Labs Reviewed  COMPREHENSIVE METABOLIC PANEL - Abnormal; Notable for the following components:      Result Value   Sodium 132 (*)    Chloride 92 (*)    Glucose, Bld 109 (*)    BUN 84 (*)    Creatinine, Ser 2.72 (*)    ALT 13 (*)    GFR calc non Af Amer 20 (*)    GFR calc Af Amer 23 (*)    All other components within normal limits  CBC WITH DIFFERENTIAL/PLATELET - Abnormal; Notable for the following components:   RBC 3.81 (*)    Hemoglobin 11.5 (*)    HCT 35.6 (*)    All other components within normal limits  PROTIME-INR - Abnormal; Notable for the following components:   Prothrombin Time 15.7 (*)    All other components within normal limits  URINALYSIS, ROUTINE W REFLEX MICROSCOPIC - Abnormal; Notable for the following components:   Hgb urine  dipstick MODERATE (*)    Leukocytes, UA LARGE (*)    All other components within normal limits  URINALYSIS, MICROSCOPIC (REFLEX) - Abnormal; Notable for the following components:   Bacteria, UA FEW (*)    Squamous Epithelial /  LPF 0-5 (*)    All other components within normal limits  CULTURE, BLOOD (ROUTINE X 2)  CULTURE, BLOOD (ROUTINE X 2)  URINE CULTURE  TROPONIN I  INFLUENZA PANEL BY PCR (TYPE A & B)  I-STAT CG4 LACTIC ACID, ED    EKG  EKG Interpretation  Date/Time:  Friday April 06 2017 14:36:58 EST Ventricular Rate:  64 PR Interval:    QRS Duration: 189 QT Interval:  508 QTC Calculation: 525 R Axis:   -78 Text Interpretation:  Atrial fibrillation Left bundle branch block Confirmed by Julianne Rice [phone]) on 04/06/2017 2:40:24 PM       Radiology Dg Chest 2 View  Result Date: 04/06/2017 CLINICAL DATA:  Altered mental status and hypotension. EXAM: CHEST  2 VIEW COMPARISON:  Chest radiograph 07/15/2014 FINDINGS: Remote median sternotomy and CABG. Unchanged cardiomegaly. The lungs are clear. No pleural effusion or pneumothorax. IMPRESSION: Unchanged cardiomegaly without pulmonary edema. Electronically Signed   By: Ulyses Jarred M.D.   On: 04/06/2017 14:03    Procedures Procedures (including critical care time)  Medications Ordered in ED Medications  sodium chloride 0.9 % bolus 500 mL (0 mLs Intravenous Stopped 04/06/17 1523)  ondansetron (ZOFRAN) injection 4 mg (4 mg Intravenous Given 04/06/17 1513)     Initial Impression / Assessment and Plan / ED Course  I have reviewed the triage vital signs and the nursing notes.  Pertinent labs & imaging results that were available during my care of the patient were reviewed by me and considered in my medical decision making (see chart for details).     Patient with generalized weakness and confusion, per patient's sons.  Patient reports he feels fine.  Patient has been sleep deprived and not eating or drinking because he  has been taking care of his wife over the past several weeks.  He denies drinking fluids regularly over the past 2 or 3 days.  Patient's labs reflect dehydration.  Lactic is negative, however.  Blood cultures sent and pending.  Hemoglobin 11.5, which is about the same as one month ago.  Creatinine and BUN are increased from 1 month ago, however most likely related to dehydration.  UA shows moderate hematuria, large leukocytes, 6-30 WBCs, but few bacteria.  Will send for culture.  Considering catheterized specimen, not glaring UTI at this time, but would treat if culture positive.  Patient given 500 mL bolus considering CHF history.  He is eating and drinking well.  He is ambulating.  His sons state that his mental status has much improved.  Patient states he feels well and would like to go home.  Patient advised strict return precautions and close follow-up to PCP for recheck of kidney function in 2-3 days.  Patient encouraged to eat and drink regularly.  He is tolerating oral fluids and food in the ED prior to discharge.  Return precautions discussed.  Patient understands and agrees with plan.  Patient vitals stable and discharged in satisfactory condition.  Patient also evaluated by Dr. Lita Mains who guided the patient's management and agrees with plan.  Final Clinical Impressions(s) / ED Diagnoses   Final diagnoses:  Generalized weakness  Dehydration    ED Discharge Orders    None       Frederica Kuster, PA-C 04/06/17 1700    Julianne Rice, MD 04/09/17 [phone]

## 2017-04-06 NOTE — Discharge Instructions (Signed)
Make sure to drink fluids and eat regularly.  Please follow-up with your doctor in 3-4 days for recheck of your kidney function and of your symptoms.  Please return to the emergency department immediately if you develop any new or worsening symptoms.

## 2017-04-06 NOTE — ED Notes (Signed)
Pt drinking water and given crackers. PA at bedside.

## 2017-04-07 LAB — URINE CULTURE

## 2017-04-11 LAB — CULTURE, BLOOD (ROUTINE X 2)
Culture: NO GROWTH
Culture: NO GROWTH
Special Requests: ADEQUATE
Special Requests: ADEQUATE

## 2017-04-27 NOTE — Telephone Encounter (Addendum)
Caller name: April Manson / Locastro,Deep Jr Relation to pt: son  Call back number:  [phone] Pharmacy: [facility] [place], [address] AT [place] [phone] (Phone) [phone] (Fax)    Reason for call:  Son wanted to inform PCP, patient wife has only a few days to live and patient is taking this extremely hard. Son is aware of PCP instructions mentioned below but wanted to know if PCP can prescribe just 4 pills for the time being due to patient spouse possible not making it thru the weekend, please advise

## 2017-05-01 ENCOUNTER — Other Ambulatory Visit: Payer: Self-pay

## 2017-05-01 ENCOUNTER — Ambulatory Visit: Payer: Self-pay | Admitting: *Deleted

## 2017-05-01 MED ORDER — ESCITALOPRAM OXALATE 20 MG PO TABS: 20.0000 mg | ORAL_TABLET | Freq: Every day | ORAL | 1 refills | Status: DC

## 2017-05-01 MED ORDER — BUSPIRONE HCL 5 MG PO TABS: ORAL_TABLET | ORAL | 0 refills | Status: DC

## 2017-05-01 NOTE — Telephone Encounter (Signed)
Pt called with complaints of "shakiness" ; he says he is very anxious and shaky and he did not sleep at all last night; he states that he has an old prescription for escitalopram 20 mg and he wants to know if he can take this?; the pt states that he is house bound because his family had to go back to work ; nurse triage initiated and recommendations made per protocol to include seeing a physician within 3 days; offered pt's daughter in law Anderson Malta an appointment today with Dr Ethelene Hal at [facility] but she is not sure if she can have him there at that time; spoke with Cornell Barman, and she will forward this information to the provider and will call the pt back; the pt and his daughter in law Anderson Malta can be contacted at [phone]   Reason for Disposition . Recent traumatic event (e.g., death of a loved one, job loss, victim/witness of crime)  Answer Assessment - Initial Assessment Questions 1. CONCERN: "What happened that made you call today?"     Anxiety, did not sleep last night 2. ANXIETY SYMPTOM SCREENING: "Can you describe how you have been feeling?"  (e.g., tense, restless, panicky, anxious, keyed up, trouble sleeping, trouble concentrating)     Anxious, wife passed away 4 days ago 3. ONSET: "How long have you been feeling this way?"     04/28/17 4. RECURRENT: "Have you felt this way before?"  If yes: "What happened that time?" "What helped these feelings go away in the past?"      no 5. RISK OF HARM - SUICIDAL IDEATION:  "Do you ever have thoughts of hurting or killing yourself?"  (e.g., yes, no, no but preoccupation with thoughts about death)   - INTENT:  "Do you have thoughts of hurting or killing yourself right NOW?" (e.g., yes, no, N/A)   - PLAN: "Do you have a specific plan for how you would do this?" (e.g., gun, knife, overdose, no plan, N/A)     no 6. RISK OF HARM - HOMICIDAL IDEATION:  "Do you ever have thoughts of hurting or killing someone else?"  (e.g., yes, no, no but preoccupation with  thoughts about death)   - INTENT:  "Do you have thoughts of hurting or killing someone right NOW?" (e.g., yes, no, N/A)   - PLAN: "Do you have a specific plan for how you would do this?" (e.g., gun, knife, no plan, N/A)      no 7. FUNCTIONAL IMPAIRMENT: "How have things been going for you overall in your life? Have you had any more difficulties than usual doing your normal daily activities?"  (e.g., better, same, worse; self-care, school, work, interactions)     Crying a lot 8. SUPPORT: "Who is with you now?" "Who do you live with?" "Do you have family or friends nearby who you can talk to?"      family 62. THERAPIST: "Do you have a counselor or therapist? Name?"     no 10. STRESSORS: "Has there been any new stress or recent changes in your life?"       Wife passed away 4 days ago 2022/06/05. CAFFEINE ABUSE: "Do you drink caffeinated beverages, and how much each day?" (e.g., coffee, tea, colas)       n/a 12. SUBSTANCE ABUSE: "Do you use any illegal drugs or alcohol?"       n/a 13. OTHER SYMPTOMS: "Do you have any other physical symptoms right now?" (e.g., chest pain, palpitations, difficulty breathing, fever)       \  no 14. PREGNANCY: "Is there any chance you are pregnant?" "When was your last menstrual period?"       n/a  Protocols used: ANXIETY AND PANIC ATTACK-A-AH

## 2017-05-01 NOTE — Telephone Encounter (Signed)
Rx sent in, patient is aware.

## 2017-05-01 NOTE — Telephone Encounter (Signed)
Buspirone 5mg   1 1/2  Tid  #45 nr.

## 2017-05-01 NOTE — Telephone Encounter (Signed)
I called and spoke with patient's son. I advised him that we were going to send in the Lexapro. They were wanting to know if there is anything short term that patient can take to help him calm down and relax? Patient's son said that patient did not sleep at all last night. He has been taking melatonin, but that is not helping at this time. They know that the Lexapro will take a little bit to get into his system and wanted to see if we could send him something that would help him sooner than that. They have also started the process for grievance through [facility].

## 2017-05-01 NOTE — Telephone Encounter (Signed)
Go ahead and send Lexapro 20mg  1 po q d #30  1RF

## 2017-05-01 NOTE — Telephone Encounter (Signed)
Dr. Ethelene Hal - please advise.  Patient's daughter in law is unsure of when they will be able to get the patient in for an appointment due to everything going on. Patient was prescribed the Lexapro 20 mg tablet back in 10/18 when he was at [facility].

## 2017-05-16 ENCOUNTER — Other Ambulatory Visit: Payer: Self-pay | Admitting: Family Medicine

## 2017-05-17 ENCOUNTER — Ambulatory Visit (HOSPITAL_BASED_OUTPATIENT_CLINIC_OR_DEPARTMENT_OTHER)
Admission: RE | Admit: 2017-05-17 | Discharge: 2017-05-17 | Disposition: A | Discharge status: Home / Self Care | Payer: Medicare Other | Source: Ambulatory Visit | Attending: Cardiology | Admitting: Cardiology

## 2017-05-17 ENCOUNTER — Encounter (HOSPITAL_COMMUNITY): Payer: Self-pay | Admitting: Cardiology

## 2017-05-17 ENCOUNTER — Ambulatory Visit (HOSPITAL_COMMUNITY)
Admission: RE | Admit: 2017-05-17 | Discharge: 2017-05-17 | Disposition: A | Discharge status: Home / Self Care | Payer: Medicare Other | Source: Ambulatory Visit | Attending: Cardiology | Admitting: Cardiology

## 2017-05-17 VITALS — BP 136/74 | HR 81 | Ht 72.0 in | Wt 206.0 lb

## 2017-05-17 DIAGNOSIS — Z9089 Acquired absence of other organs: Secondary | ICD-10-CM | POA: Diagnosis not present

## 2017-05-17 DIAGNOSIS — I5042 Chronic combined systolic (congestive) and diastolic (congestive) heart failure: Secondary | ICD-10-CM | POA: Diagnosis not present

## 2017-05-17 DIAGNOSIS — E785 Hyperlipidemia, unspecified: Secondary | ICD-10-CM | POA: Diagnosis not present

## 2017-05-17 DIAGNOSIS — Z9049 Acquired absence of other specified parts of digestive tract: Secondary | ICD-10-CM | POA: Insufficient documentation

## 2017-05-17 DIAGNOSIS — I447 Left bundle-branch block, unspecified: Secondary | ICD-10-CM | POA: Insufficient documentation

## 2017-05-17 DIAGNOSIS — I482 Chronic atrial fibrillation: Secondary | ICD-10-CM

## 2017-05-17 DIAGNOSIS — I252 Old myocardial infarction: Secondary | ICD-10-CM | POA: Insufficient documentation

## 2017-05-17 DIAGNOSIS — I728 Aneurysm of other specified arteries: Secondary | ICD-10-CM | POA: Diagnosis not present

## 2017-05-17 DIAGNOSIS — Z951 Presence of aortocoronary bypass graft: Secondary | ICD-10-CM | POA: Insufficient documentation

## 2017-05-17 DIAGNOSIS — Z8249 Family history of ischemic heart disease and other diseases of the circulatory system: Secondary | ICD-10-CM | POA: Insufficient documentation

## 2017-05-17 DIAGNOSIS — R188 Other ascites: Secondary | ICD-10-CM | POA: Diagnosis not present

## 2017-05-17 DIAGNOSIS — D631 Anemia in chronic kidney disease: Secondary | ICD-10-CM | POA: Diagnosis not present

## 2017-05-17 DIAGNOSIS — I130 Hypertensive heart and chronic kidney disease with heart failure and stage 1 through stage 4 chronic kidney disease, or unspecified chronic kidney disease: Secondary | ICD-10-CM | POA: Diagnosis not present

## 2017-05-17 DIAGNOSIS — M545 Low back pain: Secondary | ICD-10-CM | POA: Diagnosis not present

## 2017-05-17 DIAGNOSIS — I272 Pulmonary hypertension, unspecified: Secondary | ICD-10-CM | POA: Insufficient documentation

## 2017-05-17 DIAGNOSIS — Z79899 Other long term (current) drug therapy: Secondary | ICD-10-CM | POA: Insufficient documentation

## 2017-05-17 DIAGNOSIS — N183 Chronic kidney disease, stage 3 unspecified: Secondary | ICD-10-CM

## 2017-05-17 DIAGNOSIS — I2510 Atherosclerotic heart disease of native coronary artery without angina pectoris: Secondary | ICD-10-CM | POA: Diagnosis not present

## 2017-05-17 DIAGNOSIS — I081 Rheumatic disorders of both mitral and tricuspid valves: Secondary | ICD-10-CM | POA: Insufficient documentation

## 2017-05-17 DIAGNOSIS — Z7901 Long term (current) use of anticoagulants: Secondary | ICD-10-CM | POA: Diagnosis not present

## 2017-05-17 DIAGNOSIS — I4821 Permanent atrial fibrillation: Secondary | ICD-10-CM

## 2017-05-17 DIAGNOSIS — N189 Chronic kidney disease, unspecified: Secondary | ICD-10-CM | POA: Diagnosis not present

## 2017-05-17 LAB — BASIC METABOLIC PANEL
ANION GAP: 10 (ref 5–15)
BUN: 39 mg/dL — ABNORMAL HIGH (ref 6–20)
CALCIUM: 9.5 mg/dL (ref 8.9–10.3)
CO2: 30 mmol/L (ref 22–32)
Chloride: 96 mmol/L — ABNORMAL LOW (ref 101–111)
Creatinine, Ser: 1.91 mg/dL — ABNORMAL HIGH (ref 0.61–1.24)
GFR, EST AFRICAN AMERICAN: 35 mL/min — AB (ref >60)
GFR, EST NON AFRICAN AMERICAN: 30 mL/min — AB (ref >60)
Glucose, Bld: 98 mg/dL (ref 65–99)
Potassium: 4.6 mmol/L (ref 3.5–5.1)
Sodium: 136 mmol/L (ref 135–145)

## 2017-05-17 MED ORDER — TORSEMIDE 20 MG PO TABS: ORAL_TABLET | ORAL | 3 refills | Status: DC

## 2017-05-17 NOTE — Progress Notes (Signed)
  Echocardiogram 2D Echocardiogram has been performed.  Gary [name] 05/17/2017, 3:14 PM

## 2017-05-17 NOTE — Patient Instructions (Addendum)
Stop lisinopril  Increase Torsemide 40 mg (2 tabs)  AM, 80 mg (4 tabs) in PM  Labs drawn today (if we do not call you, then your lab work was stable)   [facility] will draw labs in 1 week  Your physician recommends that you schedule a follow-up appointment in: 1 month with Dr. Aundra Dubin

## 2017-05-19 NOTE — Progress Notes (Signed)
Patient ID: Gary Lope Sr., male   DOB: 1929-07-10, 84 y.o.   MRN: [id number] PCP: [facility] Cardiology: Dr. Aundra Dubin  84 y.o. with history of CAD s/p CABG in 1995, chronic atrial fibrillation, CKD, and chronic diastolic CHF with prominent RV failure presents for CHF clinic evaluation.  Patient has struggled with right-sided failure. He was admitted in 5/16 with abdominal distention, volume overload, and elevated creatinine as high as 6.  Echo showed normal LV EF but RV was moderately dilated with moderate-severe TR, ?Ebsteins anomaly anatomy.  He had a paracentesis for ascites.  V/Q scan was negative for PE. He refused RHC while in the hospital.  He was diuresed and creatinine improved.  Repeat echo was done 6/17.  This showed EF down to 20-25% with regional wall motion abnormalities, moderately dilated RV, only mild TR with PASP 44 mmHg.  Lexiscan Cardiolite in 8/17 showed EF 39% with fixed defects, no ischemia.   Gary [name] returns for followup of CHF.  Weight is up 4 lbs. His wife recently passed away under hospice care.  Since her death, he has been eating a lot of high sodium food (prepared by friends/neighbors) and eating out.  He is short of breath walking up inclines, stairs. No dyspnea on flat ground.  No chest pain.  No orthopnea/PND.  Creatinine has run higher recently.   Echo was done today, EF 30-35% with D-shaped septum and moderately dilated/moderately dysfunctional RV, moderate-severe TR.    Labs (5/16): K 3.6, creatinine 6.15 => 2.47, LFTs normal, LDL 23, HCT 25.2 Labs (07/28/14): K 4.6, Creatinine 2.62, BNP 463 Labs (6/16): creatinine 2.23 => 2.01, K 4.4, BNP 227 Labs (8/16): LFTs normal Labs (12/16): K 4, creatinine 2.11, HCT 40.8 Labs (6/17): K 4.5, creatinine 1.6, LDL 37, HDL 30 Labs (8/17): K 4.2, creatinine 1.77, BNP 176 Labs (1/18): K 4.4, creatinine 1.75 Labs (9/18): K 5, creatinine 1.7 Labs (12/18): LDL 41, HDL 36 Labs (2/19): K 4.7, creatinine 2.72, hgb 11.5  PMH:   1. CAD: MI 1983, CABG 1995.   - Lexiscan Cardiolite (8/17) with EF 39%, fixed inferior/inferoseptal defect and fixed apical septal/apical defect.  2. Chronic systolic failure with prominent RV failure: ?Ischemic cardiomyopathy.  - Echo (5/16) with EF 55-60%, moderate LV hypertrophy, moderately dilated RV, massive biatrial enlargement, moderate-severe TR with some question of Ebstein's anomaly, PA systolic pressure 37 mmHg.  Patient has had ascites likely related to RV failure with h/o paracentesis.  - Echo (6/17) with EF 20-25%, regional wall motion abnormalities, mild Gary, moderately dilated RV with normal systolic function, mild TR (improved from prior), PASP 44 mmHg.  - Unable to tolerate spironolactone.  - Echo (3/19) with EF 30-35% with D-shaped septum and moderately dilated/moderately dysfunctional RV, moderate-severe TR, mild to moderate Gary.  PASP 41 mmHg, dilated IVC.  3. CKD 4. Permanent atrial fibrillation 5. LBBB 6. Hyperlipidemia 7. HTN 8. Complex pancreatic cyst: Followed by GI.  9. Neurogenic bladder: Chronic foley.  10. Anemia of renal disease.  11. Cholecystectomy 12. Appendectomy 13. Low back pain.   SH: widowed in 2019, nonsmoker, 4 kids, lives with son.   FH: CAD  ROS: All systems reviewed and negative except as per HPI.   Current Outpatient Medications  Medication Sig Dispense Refill  . apixaban (ELIQUIS) 2.5 MG TABS tablet Take 1 tablet (2.5 mg total) by mouth 2 (two) times daily. 180 tablet 3  . atorvastatin (LIPITOR) 20 MG tablet Take 1 tablet (20 mg total) by mouth daily. [place]  tablet 3  . busPIRone (BUSPAR) 5 MG tablet Take 1 and 1/2 tablets by mouth three times daily. 45 tablet 0  . carvedilol (COREG) 6.25 MG tablet Take 6.25 mg by mouth 2 (two) times daily with a meal.    . eplerenone (INSPRA) 25 MG tablet Take 25 mg by mouth daily.    Marland Kitchen escitalopram (LEXAPRO) 20 MG tablet Take 1 tablet (20 mg total) by mouth daily. 30 tablet 1  . ferrous sulfate 325 (65 FE)  MG tablet Take 325 mg by mouth daily with breakfast.     . folic acid (FOLVITE) 1 MG tablet Take 1 mg by mouth daily.      . Glucos-Chondroit-Hyaluron-MSM (GLUCOSAMINE CHONDROITIN JOINT PO) Take 1 tablet by mouth 2 (two) times daily.    Marland Kitchen lidocaine (XYLOCAINE) 2 % jelly Place 1 application into the urethra daily as needed (pain). 30 mL 12  . Meth-Hyo-M Bl-Na Phos-Ph Sal (URIBEL) 118 MG CAPS TAKE 1-4 CAPSULES BY MOUTH EVERY DAY AS DIRECTED 360 capsule 0  . mirtazapine (REMERON) 45 MG tablet TAKE 1 TABLET BY MOUTH EVERY NIGHT AT BEDTIME 30 tablet 0  . Multiple Vitamin (MULTIVITAMIN) capsule Take 1 capsule by mouth daily.      . naftifine (NAFTIN) 1 % cream APPLY EXTERNALLY TO THE AFFECTED AREA DAILY 60 g 1  . neomycin-bacitracin-polymyxin (NEOSPORIN) ointment Apply 1 application topically daily as needed for wound care.     . nystatin-triamcinolone ointment (MYCOLOG) Apply topically 2 (two) times daily. 60 g 0  . ondansetron (ZOFRAN) 8 MG tablet TAKE 1 TABLET BY MOUTH EVERY 8 HOURS AS NEEDED FOR NAUSEA OR VOMITING 90 tablet 0  . OxyCODONE HCl, Abuse Deter, (OXAYDO) 5 MG TABA Take 5 mg by mouth every 30 (thirty) days. When your catheter is changed 6 tablet 0  . promethazine (PHENERGAN) 25 MG tablet Take 1 tablet (25 mg total) by mouth 4 (four) times daily as needed for nausea or vomiting. 30 tablet 0  . Saw Palmetto 450 MG CAPS Take by mouth QID.    Marland Kitchen torsemide (DEMADEX) 20 MG tablet Take 2 tablets (40 mg total) by mouth every evening AND 4 tablets (80 mg total) every evening. 180 tablet 3  . traMADol (ULTRAM) 50 MG tablet Take 1 tablet (50 mg total) by mouth every 8 (eight) hours as needed. 60 tablet 2  . triamcinolone cream (KENALOG) 0.1 % AAA TWICE DAILY AS NEEDED. DO NOT USE ON FACE, GROIN OR UNDERARMS 454 g 0   No current facility-administered medications for this encounter.    BP 136/74   Pulse 81   Ht 6' (1.829 m)   Wt 206 lb (93.4 kg)   SpO2 97%   BMI 27.94 kg/m  General: NAD Neck:  JVP 8-9 cm, no thyromegaly or thyroid nodule.  Lungs: Clear to auscultation bilaterally with normal respiratory effort. CV: Nondisplaced PMI.  Heart irregular S1/S2, no S3/S4, 3/6 HSM LLSB.  1+ ankle edema.  No carotid bruit.  Normal pedal pulses.  Abdomen: Soft, nontender, no hepatosplenomegaly, no distention.  Skin: Intact without lesions or rashes.  Neurologic: Alert and oriented x 3.  Psych: Normal affect. Extremities: No clubbing or cyanosis.  HEENT: Normal.   Assessment/Plan: 1. Atrial fibrillation: Permanent.  Dilated atria on echo, unlikely to stay in NSR if DCCV were to be attempted. He is on apixaban 2.5 bid.Marland Kitchen  HR is controlled.   2. Chronic systolic CHF with prominent RV failure:  In the past, echo had shown RV dysfunction  but normal LV systolic function.  Echo in 6/17, however, showed EF 20-25% with wall motion abnormalities and moderately dilated RV.  Concern for ischemic cardiomyopathy with worsening of coronary disease, but he has had no event consistent with MI (chest, arm or neck pain) and his symptoms have been very stable.  Lexiscan Cardiolite in 8/17 showed only fixed defects, no ischemia.  No evidence that he has tachycardia-mediated cardiomyopathy (rate has been controlled).  Echo today showed EF 30-35%, D-shaped interventricular septum, dysfunctional RV, and moderate-severe TR.  NYHA class III symptoms.  He is volume overloaded with weight up.  This may be in large part dietary given very high sodium diet recently.  - Increase torsemide to 40 qam/80 qpm.  BMET today and again in 1 week.     - Continue Coreg 6.25 mg bid. - Continue eplerenone (did not tolerate spironolactone).  - With rise in creatinine, stop lisinopril.   3. Tricuspid regurgitation: Moderate to severe TR on most recent echo with RV failure. 4. Ascites: Suspect this has been due hepatic congestion from RV failure.  Now resolved.  5. CKD: Creatinine higher recently.  Suspect cardiorenal component.  He is  seeing Dr Joelyn Oms for nephrology.  HD would be difficult with RV failure. BMET today.  6. CAD: s/p CABG.  No chest pain.  Continue statin.  No ASA with use of warfarin and stable CAD.  Lexiscan Cardiolite in 8/17 showed areas of infarction with no ischemia, given CKD would hold off on Cardiac cath in absence of ischemia. - Good lipids in 12/18.   Followup in 1 month.   [name] 05/19/2017

## 2017-05-29 ENCOUNTER — Other Ambulatory Visit (HOSPITAL_COMMUNITY): Payer: Self-pay | Admitting: *Deleted

## 2017-05-29 MED ORDER — EPLERENONE 25 MG PO TABS: 25.0000 mg | ORAL_TABLET | Freq: Every day | ORAL | 3 refills | Status: DC

## 2017-06-04 ENCOUNTER — Telehealth (HOSPITAL_COMMUNITY): Payer: Self-pay

## 2017-06-04 NOTE — Telephone Encounter (Signed)
Pt needed a Rx for [facility] he lost the RX he gotten at [place]. Faxed new Rx to [phone] for home RN to draw BMET.

## 2017-06-29 ENCOUNTER — Ambulatory Visit (HOSPITAL_COMMUNITY)
Admission: RE | Admit: 2017-06-29 | Discharge: 2017-06-29 | Disposition: A | Discharge status: Home / Self Care | Payer: Medicare Other | Source: Ambulatory Visit | Attending: Cardiology | Admitting: Cardiology

## 2017-06-29 ENCOUNTER — Other Ambulatory Visit: Payer: Self-pay

## 2017-06-29 ENCOUNTER — Encounter (HOSPITAL_COMMUNITY): Payer: Self-pay | Admitting: Cardiology

## 2017-06-29 VITALS — BP 131/68 | HR 79 | Wt 195.0 lb

## 2017-06-29 DIAGNOSIS — Z951 Presence of aortocoronary bypass graft: Secondary | ICD-10-CM | POA: Diagnosis not present

## 2017-06-29 DIAGNOSIS — I5042 Chronic combined systolic (congestive) and diastolic (congestive) heart failure: Secondary | ICD-10-CM

## 2017-06-29 DIAGNOSIS — N183 Chronic kidney disease, stage 3 unspecified: Secondary | ICD-10-CM

## 2017-06-29 DIAGNOSIS — I071 Rheumatic tricuspid insufficiency: Secondary | ICD-10-CM | POA: Insufficient documentation

## 2017-06-29 DIAGNOSIS — Z8249 Family history of ischemic heart disease and other diseases of the circulatory system: Secondary | ICD-10-CM | POA: Insufficient documentation

## 2017-06-29 DIAGNOSIS — I252 Old myocardial infarction: Secondary | ICD-10-CM | POA: Diagnosis not present

## 2017-06-29 DIAGNOSIS — E785 Hyperlipidemia, unspecified: Secondary | ICD-10-CM | POA: Insufficient documentation

## 2017-06-29 DIAGNOSIS — Z9049 Acquired absence of other specified parts of digestive tract: Secondary | ICD-10-CM | POA: Diagnosis not present

## 2017-06-29 DIAGNOSIS — I2510 Atherosclerotic heart disease of native coronary artery without angina pectoris: Secondary | ICD-10-CM | POA: Diagnosis not present

## 2017-06-29 DIAGNOSIS — I5022 Chronic systolic (congestive) heart failure: Secondary | ICD-10-CM | POA: Diagnosis not present

## 2017-06-29 DIAGNOSIS — Z79899 Other long term (current) drug therapy: Secondary | ICD-10-CM | POA: Diagnosis not present

## 2017-06-29 DIAGNOSIS — N319 Neuromuscular dysfunction of bladder, unspecified: Secondary | ICD-10-CM | POA: Diagnosis not present

## 2017-06-29 DIAGNOSIS — Z7901 Long term (current) use of anticoagulants: Secondary | ICD-10-CM | POA: Diagnosis not present

## 2017-06-29 DIAGNOSIS — I130 Hypertensive heart and chronic kidney disease with heart failure and stage 1 through stage 4 chronic kidney disease, or unspecified chronic kidney disease: Secondary | ICD-10-CM | POA: Diagnosis not present

## 2017-06-29 DIAGNOSIS — I482 Chronic atrial fibrillation: Secondary | ICD-10-CM | POA: Diagnosis not present

## 2017-06-29 DIAGNOSIS — I5032 Chronic diastolic (congestive) heart failure: Secondary | ICD-10-CM | POA: Diagnosis present

## 2017-06-29 DIAGNOSIS — I4821 Permanent atrial fibrillation: Secondary | ICD-10-CM

## 2017-06-29 LAB — BASIC METABOLIC PANEL
Anion gap: 8 (ref 5–15)
BUN: 47 mg/dL — AB (ref 6–20)
CALCIUM: 9.8 mg/dL (ref 8.9–10.3)
CO2: 34 mmol/L — ABNORMAL HIGH (ref 22–32)
CREATININE: 1.89 mg/dL — AB (ref 0.61–1.24)
Chloride: 93 mmol/L — ABNORMAL LOW (ref 101–111)
GFR calc Af Amer: 35 mL/min — ABNORMAL LOW (ref >60)
GFR, EST NON AFRICAN AMERICAN: 31 mL/min — AB (ref >60)
Glucose, Bld: 109 mg/dL — ABNORMAL HIGH (ref 65–99)
POTASSIUM: 4.2 mmol/L (ref 3.5–5.1)
SODIUM: 135 mmol/L (ref 135–145)

## 2017-06-29 MED ORDER — CARVEDILOL 6.25 MG PO TABS: 9.3750 mg | ORAL_TABLET | Freq: Two times a day (BID) | ORAL | 3 refills | Status: DC

## 2017-06-29 NOTE — Patient Instructions (Signed)
Increase Carvedilol 9.375 (1.5 tabs), twice a day  Labs drawn today (if we do not call you, then your lab work was stable)   Your physician recommends that you schedule a follow-up appointment in: 3 months with Dr. Aundra Dubin

## 2017-07-01 NOTE — Progress Notes (Signed)
Patient ID: Gary Lope Sr., male   DOB: 01-Oct-1929, 84 y.o.   MRN: [id number] PCP: [facility] Cardiology: Dr. Aundra Dubin  84 y.o. with history of CAD s/p CABG in 1995, chronic atrial fibrillation, CKD, and chronic diastolic CHF with prominent RV failure presents for CHF clinic evaluation.  Patient has struggled with right-sided failure. He was admitted in 5/16 with abdominal distention, volume overload, and elevated creatinine as high as 6.  Echo showed normal LV EF but RV was moderately dilated with moderate-severe TR, ?Ebsteins anomaly anatomy.  He had a paracentesis for ascites.  V/Q scan was negative for PE. He refused RHC while in the hospital.  He was diuresed and creatinine improved.  Repeat echo was done 6/17.  This showed EF down to 20-25% with regional wall motion abnormalities, moderately dilated RV, only mild TR with PASP 44 mmHg.  Lexiscan Cardiolite in 8/17 showed EF 39% with fixed defects, no ischemia.   Gary [name] returns for followup of CHF.  He is off lisinopril with elevated creatinine.  At last appointment, torsemide was increased due to volume overload.  Weight is down 11 lbs.  He does not get short of breath now with ADLs. No chest pain.  He has chronically slept in a recliner.  Continues to do woodworking as a hobby.   Labs (5/16): K 3.6, creatinine 6.15 => 2.47, LFTs normal, LDL 23, HCT 25.2 Labs (07/28/14): K 4.6, Creatinine 2.62, BNP 463 Labs (6/16): creatinine 2.23 => 2.01, K 4.4, BNP 227 Labs (8/16): LFTs normal Labs (12/16): K 4, creatinine 2.11, HCT 40.8 Labs (6/17): K 4.5, creatinine 1.6, LDL 37, HDL 30 Labs (8/17): K 4.2, creatinine 1.77, BNP 176 Labs (1/18): K 4.4, creatinine 1.75 Labs (9/18): K 5, creatinine 1.7 Labs (12/18): LDL 41, HDL 36 Labs (2/19): K 4.7, creatinine 2.72, hgb 11.5 Labs (3/19): K 4.6, creatinine 1.9  PMH:  1. CAD: MI 1983, CABG 1995.   - Lexiscan Cardiolite (8/17) with EF 39%, fixed inferior/inferoseptal defect and fixed apical septal/apical  defect.  2. Chronic systolic failure with prominent RV failure: ?Ischemic cardiomyopathy.  - Echo (5/16) with EF 55-60%, moderate LV hypertrophy, moderately dilated RV, massive biatrial enlargement, moderate-severe TR with some question of Ebstein's anomaly, PA systolic pressure 37 mmHg.  Patient has had ascites likely related to RV failure with h/o paracentesis.  - Echo (6/17) with EF 20-25%, regional wall motion abnormalities, mild Gary, moderately dilated RV with normal systolic function, mild TR (improved from prior), PASP 44 mmHg.  - Unable to tolerate spironolactone.  - Echo (3/19) with EF 30-35% with D-shaped septum and moderately dilated/moderately dysfunctional RV, moderate-severe TR, mild to moderate Gary.  PASP 41 mmHg, dilated IVC.  3. CKD stage 3 4. Permanent atrial fibrillation 5. LBBB 6. Hyperlipidemia 7. HTN 8. Complex pancreatic cyst: Followed by GI.  9. Neurogenic bladder: Chronic foley.  10. Anemia of renal disease.  11. Cholecystectomy 12. Appendectomy 13. Low back pain.   SH: widowed in 2019, nonsmoker, 4 kids, lives with son.   FH: CAD  ROS: All systems reviewed and negative except as per HPI.   Current Outpatient Medications  Medication Sig Dispense Refill  . apixaban (ELIQUIS) 2.5 MG TABS tablet Take 1 tablet (2.5 mg total) by mouth 2 (two) times daily. 180 tablet 3  . atorvastatin (LIPITOR) 20 MG tablet Take 1 tablet (20 mg total) by mouth daily. 90 tablet 3  . busPIRone (BUSPAR) 5 MG tablet Take 1 and 1/2 tablets by mouth three times  daily. 45 tablet 0  . carvedilol (COREG) 6.25 MG tablet Take 1.5 tablets (9.375 mg total) by mouth 2 (two) times daily with a meal. 90 tablet 3  . eplerenone (INSPRA) 25 MG tablet Take 1 tablet (25 mg total) by mouth daily. 90 tablet 3  . escitalopram (LEXAPRO) 20 MG tablet Take 1 tablet (20 mg total) by mouth daily. 30 tablet 1  . ferrous sulfate 325 (65 FE) MG tablet Take 325 mg by mouth daily with breakfast.     . folic acid  (FOLVITE) 1 MG tablet Take 1 mg by mouth daily.      Marland Kitchen lidocaine (XYLOCAINE) 2 % jelly Place 1 application into the urethra daily as needed (pain). 30 mL 12  . Meth-Hyo-M Bl-Na Phos-Ph Sal (URIBEL) 118 MG CAPS TAKE 1-4 CAPSULES BY MOUTH EVERY DAY AS DIRECTED 360 capsule 0  . mirtazapine (REMERON) 45 MG tablet TAKE 1 TABLET BY MOUTH EVERY NIGHT AT BEDTIME 30 tablet 0  . Multiple Vitamin (MULTIVITAMIN) capsule Take 1 capsule by mouth daily.      . naftifine (NAFTIN) 1 % cream APPLY EXTERNALLY TO THE AFFECTED AREA DAILY 60 g 1  . neomycin-bacitracin-polymyxin (NEOSPORIN) ointment Apply 1 application topically daily as needed for wound care.     . nystatin-triamcinolone ointment (MYCOLOG) Apply topically 2 (two) times daily. 60 g 0  . ondansetron (ZOFRAN) 8 MG tablet TAKE 1 TABLET BY MOUTH EVERY 8 HOURS AS NEEDED FOR NAUSEA OR VOMITING 90 tablet 0  . OxyCODONE HCl, Abuse Deter, (OXAYDO) 5 MG TABA Take 5 mg by mouth every 30 (thirty) days. When your catheter is changed 6 tablet 0  . promethazine (PHENERGAN) 25 MG tablet Take 1 tablet (25 mg total) by mouth 4 (four) times daily as needed for nausea or vomiting. 30 tablet 0  . Saw Palmetto 450 MG CAPS Take by mouth QID.    Marland Kitchen torsemide (DEMADEX) 20 MG tablet Take 40mg  every morning and 80mg  every evening.    . traMADol (ULTRAM) 50 MG tablet Take 1 tablet (50 mg total) by mouth every 8 (eight) hours as needed. 60 tablet 2  . triamcinolone cream (KENALOG) 0.1 % AAA TWICE DAILY AS NEEDED. DO NOT USE ON FACE, GROIN OR UNDERARMS 454 g 0  . Glucos-Chondroit-Hyaluron-MSM (GLUCOSAMINE CHONDROITIN JOINT PO) Take 1 tablet by mouth 2 (two) times daily.     No current facility-administered medications for this encounter.    BP 131/68   Pulse 79   Wt 195 lb (88.5 kg)   SpO2 98%   BMI 26.45 kg/m  General: NAD Neck: JVP 8 cm, no thyromegaly or thyroid nodule.  Lungs: Clear to auscultation bilaterally with normal respiratory effort. CV: Nondisplaced PMI.  Heart  irregular S1/S2, no S3/S4, no murmur.  Trace ankle edema.  No carotid bruit.  Normal pedal pulses.  Abdomen: Soft, nontender, no hepatosplenomegaly, no distention.  Skin: Intact without lesions or rashes.  Neurologic: Alert and oriented x 3.  Psych: Normal affect. Extremities: No clubbing or cyanosis.  HEENT: Normal.   Assessment/Plan: 1. Atrial fibrillation: Permanent.  Dilated atria on echo, unlikely to stay in NSR if DCCV were to be attempted. He is on apixaban 2.5 bid.  HR is controlled.   2. Chronic systolic CHF with prominent RV failure:  In the past, echo had shown RV dysfunction but normal LV systolic function.  Echo in 6/17, however, showed EF 20-25% with wall motion abnormalities and moderately dilated RV.  Concern for ischemic cardiomyopathy with worsening  of coronary disease, but he has had no event consistent with MI (chest, arm or neck pain) and his symptoms have been very stable.  Lexiscan Cardiolite in 8/17 showed only fixed defects, no ischemia.  No evidence that he has tachycardia-mediated cardiomyopathy (rate has been controlled).  Echo in 3/19 showed EF 30-35%, D-shaped interventricular septum, dysfunctional RV, and moderate-severe TR.  NYHA class II symptoms currently, volume status improved and weight down.  - Continue torsemide 40 qam/80 qpm.  BMET today.     - Increase Coreg to 9.375 mg bid. - Continue eplerenone (did not tolerate spironolactone).  - Off lisinopril with rise in creatinine in the past to 2.7.  3. Tricuspid regurgitation: Moderate to severe TR on most recent echo with RV failure. 4. Ascites: Suspect this has been due hepatic congestion from RV failure.  Now resolved.  5. CKD: Stage 3.  HD would be difficult with RV failure. Most recent creatinine improved.   - BMET today.  6. CAD: s/p CABG.  No chest pain.  Continue statin.  No ASA with use of warfarin and stable CAD.  Lexiscan Cardiolite in 8/17 showed areas of infarction with no ischemia, given CKD would  hold off on cardiac cath in absence of ischemia. - Good lipids in 12/18.   Followup in 3 months.   [name] 07/01/2017

## 2017-07-06 ENCOUNTER — Encounter (HOSPITAL_BASED_OUTPATIENT_CLINIC_OR_DEPARTMENT_OTHER): Payer: Self-pay | Admitting: *Deleted

## 2017-07-06 ENCOUNTER — Emergency Department (HOSPITAL_BASED_OUTPATIENT_CLINIC_OR_DEPARTMENT_OTHER)
Admission: EM | Admit: 2017-07-06 | Discharge: 2017-07-06 | Disposition: A | Discharge status: Home / Self Care | Payer: Medicare Other | Attending: Emergency Medicine | Admitting: Emergency Medicine

## 2017-07-06 ENCOUNTER — Other Ambulatory Visit: Payer: Self-pay

## 2017-07-06 DIAGNOSIS — I5042 Chronic combined systolic (congestive) and diastolic (congestive) heart failure: Secondary | ICD-10-CM | POA: Insufficient documentation

## 2017-07-06 DIAGNOSIS — I130 Hypertensive heart and chronic kidney disease with heart failure and stage 1 through stage 4 chronic kidney disease, or unspecified chronic kidney disease: Secondary | ICD-10-CM | POA: Insufficient documentation

## 2017-07-06 DIAGNOSIS — Z87891 Personal history of nicotine dependence: Secondary | ICD-10-CM | POA: Insufficient documentation

## 2017-07-06 DIAGNOSIS — Z7901 Long term (current) use of anticoagulants: Secondary | ICD-10-CM | POA: Insufficient documentation

## 2017-07-06 DIAGNOSIS — L03116 Cellulitis of left lower limb: Secondary | ICD-10-CM | POA: Insufficient documentation

## 2017-07-06 DIAGNOSIS — Z79899 Other long term (current) drug therapy: Secondary | ICD-10-CM | POA: Diagnosis not present

## 2017-07-06 DIAGNOSIS — I2510 Atherosclerotic heart disease of native coronary artery without angina pectoris: Secondary | ICD-10-CM | POA: Insufficient documentation

## 2017-07-06 DIAGNOSIS — N184 Chronic kidney disease, stage 4 (severe): Secondary | ICD-10-CM | POA: Diagnosis not present

## 2017-07-06 DIAGNOSIS — R2242 Localized swelling, mass and lump, left lower limb: Secondary | ICD-10-CM | POA: Diagnosis present

## 2017-07-06 MED ORDER — DOXYCYCLINE HYCLATE 100 MG PO TABS
100.0000 mg | ORAL_TABLET | Freq: Once | ORAL | Status: AC
  Administered 2017-07-06: 100 mg via ORAL
  Filled 2017-07-06: qty 1

## 2017-07-06 MED ORDER — DOXYCYCLINE HYCLATE 100 MG PO CAPS: 100.0000 mg | ORAL_CAPSULE | Freq: Two times a day (BID) | ORAL | 0 refills | Status: DC

## 2017-07-06 NOTE — ED Triage Notes (Signed)
Pt c/o left leg injury x 3 days ago with redness and swelling.

## 2017-07-06 NOTE — ED Notes (Signed)
Pt and family understood dc material. NAD noted. Script given at dc 

## 2017-07-06 NOTE — ED Provider Notes (Signed)
[facility] EMERGENCY DEPARTMENT Provider Note   CSN: [id number] Arrival date & time: 07/06/17  1858     History   Chief Complaint Chief Complaint  Patient presents with  . Leg Injury    HPI Gary [name]. is a 84 y.o. male.  89 y oM with a chief complaint of left lower extremity pain.  He dropped a board on it when he was working at his house.  He does crafts as a hobby.  He had a cut to his knee which she has been spraying antibacterial spray and a wound to the inside of the left upper arm.  The wound to the left upper arm is healed well however he is concerned about his leg that is now become red and more swollen.  He denies fevers or chills.  Denies nausea or vomiting.  The history is provided by the patient and a friend.  Illness  This is a new problem. The current episode started more than 2 days ago. The problem occurs constantly. The problem has been gradually worsening. Pertinent negatives include no chest pain, no abdominal pain, no headaches and no shortness of breath. Nothing aggravates the symptoms. Nothing relieves the symptoms. He has tried nothing for the symptoms. The treatment provided no relief.    Past Medical History:  Diagnosis Date  . Anemia   . Anxiety   . Arrhythmia    afib  . CHF (congestive heart failure) ([place])   . Chronic systolic heart failure (HCC)    a. prior EF 20-30%;  b. Echo 7/09: EF 35-40%, basal inferoposterior HK, mild AI, mild to moderate MR, moderate to marked LAE, mild to moderate TR, mildly increased PASP, moderate RAE;   c. Echo 01/2012:   mild LVH, EF 50%, Tr AI, mild MR, severe LAE, mod to severe RAE, mod to severe RVE, mod reduced RVSF, mod to severe TR, PASP 46  . Coronary artery disease   . GERD (gastroesophageal reflux disease)   . Heart attack ([place])    1983  . Hyperlipidemia   . Hypertension   . Neurogenic bladder    has had bladder catheter changes at the urology office every 12 weeks  . Renal failure   .  Warfarin anticoagulation     Patient Active Problem List   Diagnosis Date Noted  . Arthritis of multiple sites 03/09/2017  . Neurogenic bladder disorder 03/09/2017  . Depression 03/09/2017  . Anxiety 03/09/2017  . Anemia-transfused 07/15/14 07/18/2014  . CKD (chronic kidney disease), stage IV ([place]) 07/15/2014  . Acute on chronic systolic and diastolic heart failure, NYHA class 4 ([place]) 07/14/2014  . Long-term (current) use of anticoagulants 07/08/2014  . Right heart failure ([place]) 06/16/2013  . Valvular heart disease - moderate MR, mild AI, severe TR 06/16/2013  . Pancreatic lesion 06/16/2013  . Finding of prostate - prominent by CT 05/2013 06/16/2013  . CKD (chronic kidney disease), stage III ([place]) 06/16/2013  . Chronic combined systolic and diastolic heart failure ([place]) 01/15/2013  . CAD (coronary artery disease) 01/15/2013  . Other malaise and fatigue 07/01/2012  . Recurrent UTI 04/18/2011  . Hematuria 04/18/2011  . Cellulitis 07/13/2010  . LBBB (left bundle branch block) 06/16/2009  . CARDIOMYOPATHY, ISCHEMIC 06/17/2007  . Renal failure 03/18/2007  . Adjustment disorder with mixed anxiety and depressed mood 12/12/2006  . Permanent atrial fibrillation ([place]) 10/04/2006  . Hyperlipidemia 09/10/2006  . ANEMIA NOS 09/10/2006  . Essential hypertension-now hypotensive 09/10/2006  . GERD 09/10/2006  .  URINARY INCONTINENCE 09/10/2006    Past Surgical History:  Procedure Laterality Date  . APPENDECTOMY    . CHOLECYSTECTOMY     1971  . CORONARY ARTERY BYPASS GRAFT     1995        Home Medications    Prior to Admission medications   Medication Sig Start Date End Date Taking? Authorizing Provider  apixaban (ELIQUIS) 2.5 MG TABS tablet Take 1 tablet (2.5 mg total) by mouth 2 (two) times daily. 12/18/16   Larey Dresser, MD  atorvastatin (LIPITOR) 20 MG tablet Take 1 tablet (20 mg total) by mouth daily. 01/22/17   Larey Dresser, MD  busPIRone (BUSPAR) 5 MG tablet Take 1  and 1/2 tablets by mouth three times daily. 05/01/17   Libby Maw, MD  carvedilol (COREG) 6.25 MG tablet Take 1.5 tablets (9.375 mg total) by mouth 2 (two) times daily with a meal. 06/29/17   Larey Dresser, MD  doxycycline (VIBRAMYCIN) 100 MG capsule Take 1 capsule (100 mg total) by mouth 2 (two) times daily. One po bid x 7 days 07/06/17   Deno Etienne, DO  eplerenone (INSPRA) 25 MG tablet Take 1 tablet (25 mg total) by mouth daily. 05/29/17   Larey Dresser, MD  escitalopram (LEXAPRO) 20 MG tablet Take 1 tablet (20 mg total) by mouth daily. 05/01/17   Libby Maw, MD  ferrous sulfate 325 (65 FE) MG tablet Take 325 mg by mouth daily with breakfast.     [provider]  folic acid (FOLVITE) 1 MG tablet Take 1 mg by mouth daily.      [provider]  Glucos-Chondroit-Hyaluron-MSM (GLUCOSAMINE CHONDROITIN JOINT PO) Take 1 tablet by mouth 2 (two) times daily.    [provider]  lidocaine (XYLOCAINE) 2 % jelly Place 1 application into the urethra daily as needed (pain). 03/09/17   Libby Maw, MD  Meth-Hyo-M Bl-Na Phos-Ph Sal (URIBEL) 118 MG CAPS TAKE 1-4 CAPSULES BY MOUTH EVERY DAY AS DIRECTED 07/06/16   Carollee Herter, Alferd Apa, DO  mirtazapine (REMERON) 45 MG tablet TAKE 1 TABLET BY MOUTH EVERY NIGHT AT BEDTIME 07/20/16   Ann Held, DO  Multiple Vitamin (MULTIVITAMIN) capsule Take 1 capsule by mouth daily.      [provider]  naftifine (NAFTIN) 1 % cream APPLY EXTERNALLY TO THE AFFECTED AREA DAILY 03/15/17   Libby Maw, MD  neomycin-bacitracin-polymyxin (NEOSPORIN) ointment Apply 1 application topically daily as needed for wound care.     [provider]  nystatin-triamcinolone ointment (MYCOLOG) Apply topically 2 (two) times daily. 04/11/16   Carollee Herter, Yvonne R, DO  ondansetron (ZOFRAN) 8 MG tablet TAKE 1 TABLET BY MOUTH EVERY 8 HOURS AS NEEDED FOR NAUSEA OR VOMITING 07/31/16   Carollee Herter, Alferd Apa, DO    OxyCODONE HCl, Abuse Deter, (OXAYDO) 5 MG TABA Take 5 mg by mouth every 30 (thirty) days. When your catheter is changed 03/14/16   Larey Dresser, MD  promethazine (PHENERGAN) 25 MG tablet Take 1 tablet (25 mg total) by mouth 4 (four) times daily as needed for nausea or vomiting. 04/11/16   Carollee Herter, Alferd Apa, DO  Saw Palmetto 450 MG CAPS Take by mouth QID.    [provider]  torsemide (DEMADEX) 20 MG tablet Take 40mg  every morning and 80mg  every evening.    [provider]  traMADol (ULTRAM) 50 MG tablet Take 1 tablet (50 mg total) by mouth every 8 (eight) hours as  needed. 03/09/17   Libby Maw, MD  triamcinolone cream (KENALOG) 0.1 % AAA TWICE DAILY AS NEEDED. DO NOT USE ON FACE, GROIN OR UNDERARMS 07/06/16   Ann Held, DO    Family History Family History  Problem Relation Age of Onset  . Cancer Mother   . Heart attack Father        deceased at 49, smoking, ETOH abuse  . Breast cancer Sister     Social History Social History   Tobacco Use  . Smoking status: Former Research scientist (life sciences)  . Smokeless tobacco: Never Used  Substance Use Topics  . Alcohol use: No  . Drug use: No     Allergies   Amoxicillin; Diltiazem hcl; Hydrocodone-acetaminophen; Niacin; Niacin-lovastatin er; Paroxetine; Propoxyphene n-acetaminophen; Pseudoephedrine; Spironolactone; Sulfonamide derivatives; and Tolterodine tartrate   Review of Systems Review of Systems  Constitutional: Negative for chills and fever.  HENT: Negative for congestion and facial swelling.   Eyes: Negative for discharge and visual disturbance.  Respiratory: Negative for shortness of breath.   Cardiovascular: Negative for chest pain and palpitations.  Gastrointestinal: Negative for abdominal pain, diarrhea and vomiting.  Musculoskeletal: Positive for myalgias. Negative for arthralgias.  Skin: Negative for color change and rash.  Neurological: Negative for tremors, syncope and headaches.   Psychiatric/Behavioral: Negative for confusion and dysphoric mood.     Physical Exam Updated Vital Signs BP 126/76   Pulse 78   Temp 97.9 F (36.6 C) (Oral)   Resp 18   Ht 6' (1.829 m)   Wt 88.5 kg (195 lb)   SpO2 100%   BMI 26.45 kg/m   Physical Exam  Constitutional: He is oriented to person, place, and time. He appears well-developed and well-nourished.  HENT:  Head: Normocephalic and atraumatic.  Eyes: Pupils are equal, round, and reactive to light. EOM are normal.  Neck: Normal range of motion. Neck supple. No JVD present.  Cardiovascular: Normal rate and regular rhythm. Exam reveals no gallop and no friction rub.  No murmur heard. Pulmonary/Chest: No respiratory distress. He has no wheezes.  Abdominal: He exhibits no distension. There is no rebound and no guarding.  Musculoskeletal: Normal range of motion. He exhibits edema and tenderness.  Left lower extremity with erythema and warmth.  There is a wound across the mid tibia with granulation tissue.  No noted drainage.  Intact pulse motor and sensation.  Neurological: He is alert and oriented to person, place, and time.  Skin: No rash noted. No pallor.  Psychiatric: He has a normal mood and affect. His behavior is normal.  Nursing note and vitals reviewed.    ED Treatments / Results  Labs (all labs ordered are listed, but only abnormal results are displayed) Labs Reviewed - No data to display  EKG None  Radiology No results found.  Procedures Procedures (including critical care time)  Medications Ordered in ED Medications  doxycycline (VIBRA-TABS) tablet 100 mg (100 mg Oral Given 07/06/17 1945)     Initial Impression / Assessment and Plan / ED Course  I have reviewed the triage vital signs and the nursing notes.  Pertinent labs & imaging results that were available during my care of the patient were reviewed by me and considered in my medical decision making (see chart for details).     84 yo M  with a chief complaint of cellulitis.  Well appearing, non toxic, afebrile.  Discussed inpatient vs outpatient therapy. Will start antibiotics.  PCP follow-up.  Final Clinical Impressions(s) / ED Diagnoses  Final diagnoses:  Cellulitis of left lower extremity    ED Discharge Orders        Ordered    doxycycline (VIBRAMYCIN) 100 MG capsule  2 times daily     07/06/17 1941       Jamisen Duerson, DO 07/06/17 2042

## 2017-07-06 NOTE — Discharge Instructions (Signed)
Return for rapid spreading redness(above the knee) or fever.

## 2017-07-18 ENCOUNTER — Other Ambulatory Visit (HOSPITAL_COMMUNITY): Payer: Self-pay | Admitting: *Deleted

## 2017-07-18 MED ORDER — CARVEDILOL 6.25 MG PO TABS: 9.3750 mg | ORAL_TABLET | Freq: Two times a day (BID) | ORAL | 3 refills | Status: DC

## 2017-09-05 ENCOUNTER — Other Ambulatory Visit: Payer: Self-pay | Admitting: Family Medicine

## 2017-09-05 DIAGNOSIS — M1990 Unspecified osteoarthritis, unspecified site: Secondary | ICD-10-CM

## 2017-09-05 DIAGNOSIS — N319 Neuromuscular dysfunction of bladder, unspecified: Secondary | ICD-10-CM

## 2017-09-05 NOTE — Telephone Encounter (Signed)
Okay to refill or does patient need an appointment?

## 2017-09-18 ENCOUNTER — Other Ambulatory Visit (HOSPITAL_COMMUNITY): Payer: Self-pay

## 2017-09-18 MED ORDER — TORSEMIDE 20 MG PO TABS: 20.0000 mg | ORAL_TABLET | Freq: Every day | ORAL | 3 refills | Status: DC

## 2017-10-05 ENCOUNTER — Encounter (HOSPITAL_COMMUNITY): Payer: Medicare Other | Admitting: Cardiology

## 2017-10-23 ENCOUNTER — Other Ambulatory Visit (HOSPITAL_COMMUNITY): Payer: Self-pay

## 2017-10-23 MED ORDER — APIXABAN 2.5 MG PO TABS: 2.5000 mg | ORAL_TABLET | Freq: Two times a day (BID) | ORAL | 1 refills | Status: DC

## 2017-10-23 MED ORDER — ATORVASTATIN CALCIUM 20 MG PO TABS: 20.0000 mg | ORAL_TABLET | Freq: Every day | ORAL | 1 refills | Status: DC

## 2017-11-29 ENCOUNTER — Telehealth: Payer: Self-pay

## 2017-11-29 NOTE — Telephone Encounter (Signed)
Called the patient to inform he would need to sign a release of records/He will do so when he is at his new patient appt with new PCP

## 2017-11-29 NOTE — Telephone Encounter (Signed)
Copied from [place] [id number]. Topic: Medical Record Request - Other >> Nov 23, 2017 12:52 PM Burchel, Abbi R wrote: Pt is transferring care to Dr Nani Ravens, and would like to have his records requested from Dr Clovia Cuff.  Pt provided fax: [phone] and phone [phone].

## 2017-11-29 NOTE — Telephone Encounter (Signed)
Copied from [place] [id number]. Topic: Medical Record Request - Other >> Nov 23, 2017 12:52 PM Burchel, Abbi R wrote: Pt is transferring care to Dr Nani Ravens, and would like to have his records requested from Dr Clovia Cuff.  Pt provided fax: [phone] and phone [phone].

## 2017-12-21 ENCOUNTER — Encounter (HOSPITAL_COMMUNITY): Payer: Self-pay | Admitting: Cardiology

## 2017-12-21 ENCOUNTER — Ambulatory Visit: Payer: Medicare Other | Admitting: Family Medicine

## 2017-12-21 ENCOUNTER — Ambulatory Visit (HOSPITAL_COMMUNITY)
Admission: RE | Admit: 2017-12-21 | Discharge: 2017-12-21 | Disposition: A | Discharge status: Home / Self Care | Payer: Medicare Other | Source: Ambulatory Visit | Attending: Cardiology | Admitting: Cardiology

## 2017-12-21 VITALS — BP 126/74 | HR 102 | Wt 198.4 lb

## 2017-12-21 DIAGNOSIS — K862 Cyst of pancreas: Secondary | ICD-10-CM | POA: Diagnosis not present

## 2017-12-21 DIAGNOSIS — Z951 Presence of aortocoronary bypass graft: Secondary | ICD-10-CM | POA: Insufficient documentation

## 2017-12-21 DIAGNOSIS — D631 Anemia in chronic kidney disease: Secondary | ICD-10-CM | POA: Insufficient documentation

## 2017-12-21 DIAGNOSIS — N319 Neuromuscular dysfunction of bladder, unspecified: Secondary | ICD-10-CM | POA: Insufficient documentation

## 2017-12-21 DIAGNOSIS — I071 Rheumatic tricuspid insufficiency: Secondary | ICD-10-CM | POA: Insufficient documentation

## 2017-12-21 DIAGNOSIS — I2510 Atherosclerotic heart disease of native coronary artery without angina pectoris: Secondary | ICD-10-CM | POA: Insufficient documentation

## 2017-12-21 DIAGNOSIS — I130 Hypertensive heart and chronic kidney disease with heart failure and stage 1 through stage 4 chronic kidney disease, or unspecified chronic kidney disease: Secondary | ICD-10-CM | POA: Insufficient documentation

## 2017-12-21 DIAGNOSIS — Z8249 Family history of ischemic heart disease and other diseases of the circulatory system: Secondary | ICD-10-CM | POA: Diagnosis not present

## 2017-12-21 DIAGNOSIS — Z7901 Long term (current) use of anticoagulants: Secondary | ICD-10-CM | POA: Diagnosis not present

## 2017-12-21 DIAGNOSIS — N183 Chronic kidney disease, stage 3 unspecified: Secondary | ICD-10-CM

## 2017-12-21 DIAGNOSIS — I252 Old myocardial infarction: Secondary | ICD-10-CM | POA: Insufficient documentation

## 2017-12-21 DIAGNOSIS — I50812 Chronic right heart failure: Secondary | ICD-10-CM | POA: Diagnosis not present

## 2017-12-21 DIAGNOSIS — Z79899 Other long term (current) drug therapy: Secondary | ICD-10-CM | POA: Insufficient documentation

## 2017-12-21 DIAGNOSIS — I5042 Chronic combined systolic (congestive) and diastolic (congestive) heart failure: Secondary | ICD-10-CM | POA: Diagnosis present

## 2017-12-21 DIAGNOSIS — I5022 Chronic systolic (congestive) heart failure: Secondary | ICD-10-CM | POA: Diagnosis not present

## 2017-12-21 DIAGNOSIS — I4821 Permanent atrial fibrillation: Secondary | ICD-10-CM

## 2017-12-21 DIAGNOSIS — E785 Hyperlipidemia, unspecified: Secondary | ICD-10-CM | POA: Diagnosis not present

## 2017-12-21 DIAGNOSIS — Z9049 Acquired absence of other specified parts of digestive tract: Secondary | ICD-10-CM | POA: Insufficient documentation

## 2017-12-21 LAB — BASIC METABOLIC PANEL
ANION GAP: 9 (ref 5–15)
BUN: 34 mg/dL — ABNORMAL HIGH (ref 8–23)
CO2: 30 mmol/L (ref 22–32)
Calcium: 9.6 mg/dL (ref 8.9–10.3)
Chloride: 94 mmol/L — ABNORMAL LOW (ref 98–111)
Creatinine, Ser: 1.85 mg/dL — ABNORMAL HIGH (ref 0.61–1.24)
GFR calc non Af Amer: 31 mL/min — ABNORMAL LOW (ref >60)
GFR, EST AFRICAN AMERICAN: 36 mL/min — AB (ref >60)
Glucose, Bld: 108 mg/dL — ABNORMAL HIGH (ref 70–99)
POTASSIUM: 4.7 mmol/L (ref 3.5–5.1)
SODIUM: 133 mmol/L — AB (ref 135–145)

## 2017-12-21 LAB — CBC
HEMATOCRIT: 44.4 % (ref 39.0–52.0)
HEMOGLOBIN: 13.2 g/dL (ref 13.0–17.0)
MCH: 27.1 pg (ref 26.0–34.0)
MCHC: 29.7 g/dL — ABNORMAL LOW (ref 30.0–36.0)
MCV: 91.2 fL (ref 80.0–100.0)
Platelets: 186 10*3/uL (ref 150–400)
RBC: 4.87 MIL/uL (ref 4.22–5.81)
RDW: 14 % (ref 11.5–15.5)
WBC: 5.3 10*3/uL (ref 4.0–10.5)
nRBC: 0 % (ref 0.0–0.2)

## 2017-12-21 LAB — LIPID PANEL
CHOL/HDL RATIO: 2 ratio
Cholesterol: 83 mg/dL (ref 0–200)
HDL: 41 mg/dL (ref >40)
LDL Cholesterol: 34 mg/dL (ref 0–99)
TRIGLYCERIDES: 41 mg/dL (ref <150)
VLDL: 8 mg/dL (ref 0–40)

## 2017-12-21 NOTE — Patient Instructions (Signed)
Increase Carvedilol to 12.5 mg Twice daily   Labs done today  Labs in 3 months  We will contact you in 6 months to schedule your next appointment.

## 2017-12-23 NOTE — Progress Notes (Signed)
Patient ID: Gary Lope Sr., male   DOB: [birth year], 84 y.o.   MRN: [id number] PCP: [facility] Liter Cardiology: Dr. Aundra Dubin  84 y.o. with history of CAD s/p CABG in 1995, chronic atrial fibrillation, CKD, and chronic diastolic CHF with prominent RV failure presents for CHF clinic evaluation.  Patient has struggled with right-sided failure. He was admitted in 5/16 with abdominal distention, volume overload, and elevated creatinine as high as 6.  Echo showed normal LV EF but RV was moderately dilated with moderate-severe TR, ?Ebsteins anomaly anatomy.  He had a paracentesis for ascites.  V/Q scan was negative for PE. He refused RHC while in the hospital.  He was diuresed and creatinine improved.  Repeat echo was done 6/17.  This showed EF down to 20-25% with regional wall motion abnormalities, moderately dilated RV, only mild TR with PASP 44 mmHg.  Lexiscan Cardiolite in 8/17 showed EF 39% with fixed defects, no ischemia.   Mr Gary [name] returns for followup of CHF.  Weight is fairly stable. He has shortness of breath after walking about 1 block.  No problems doing his woodworking.  No lightheadedness. No chest pain.  No BRBPR/melena.  No palpitations.   Labs (5/16): K 3.6, creatinine 6.15 => 2.47, LFTs normal, LDL 23, HCT 25.2 Labs (07/28/14): K 4.6, Creatinine 2.62, BNP 463 Labs (6/16): creatinine 2.23 => 2.01, K 4.4, BNP 227 Labs (8/16): LFTs normal Labs (12/16): K 4, creatinine 2.11, HCT 40.8 Labs (6/17): K 4.5, creatinine 1.6, LDL 37, HDL 30 Labs (8/17): K 4.2, creatinine 1.77, BNP 176 Labs (1/18): K 4.4, creatinine 1.75 Labs (9/18): K 5, creatinine 1.7 Labs (12/18): LDL 41, HDL 36 Labs (2/19): K 4.7, creatinine 2.72, hgb 11.5 Labs (3/19): K 4.6, creatinine 1.9 Labs (5/19): K 4.2, creatinine 1.89  PMH:  1. CAD: MI 1983, CABG 1995.   - Lexiscan Cardiolite (8/17) with EF 39%, fixed inferior/inferoseptal defect and fixed apical septal/apical defect.  2. Chronic systolic failure with prominent RV  failure: ?Ischemic cardiomyopathy.  - Echo (5/16) with EF 55-60%, moderate LV hypertrophy, moderately dilated RV, massive biatrial enlargement, moderate-severe TR with some question of Ebstein's anomaly, PA systolic pressure 37 mmHg.  Patient has had ascites likely related to RV failure with h/o paracentesis.  - Echo (6/17) with EF 20-25%, regional wall motion abnormalities, mild MR, moderately dilated RV with normal systolic function, mild TR (improved from prior), PASP 44 mmHg.  - Unable to tolerate spironolactone.  - Echo (3/19) with EF 30-35% with D-shaped septum and moderately dilated/moderately dysfunctional RV, moderate-severe TR, mild to moderate MR.  PASP 41 mmHg, dilated IVC.  3. CKD stage 3 4. Permanent atrial fibrillation 5. LBBB 6. Hyperlipidemia 7. HTN 8. Complex pancreatic cyst: Followed by GI.  9. Neurogenic bladder: Chronic foley.  10. Anemia of renal disease.  11. Cholecystectomy 12. Appendectomy 13. Low back pain.   SH: widowed in 2019, nonsmoker, 4 kids, lives with son.   FH: CAD  ROS: All systems reviewed and negative except as per HPI.   Current Outpatient Medications  Medication Sig Dispense Refill  . apixaban (ELIQUIS) 2.5 MG TABS tablet Take 1 tablet (2.5 mg total) by mouth 2 (two) times daily. 180 tablet 1  . atorvastatin (LIPITOR) 20 MG tablet Take 1 tablet (20 mg total) by mouth daily. 90 tablet 1  . carvedilol (COREG) 6.25 MG tablet Take 1.5 tablets (9.375 mg total) by mouth 2 (two) times daily with a meal. 90 tablet 3  . eplerenone (INSPRA) 25 MG tablet  Take 1 tablet (25 mg total) by mouth daily. 90 tablet 3  . escitalopram (LEXAPRO) 20 MG tablet Take 1 tablet (20 mg total) by mouth daily. 30 tablet 1  . ferrous sulfate 325 (65 FE) MG tablet Take 325 mg by mouth daily with breakfast.     . folic acid (FOLVITE) 1 MG tablet Take 1 mg by mouth daily.      . Glucos-Chondroit-Hyaluron-MSM (GLUCOSAMINE CHONDROITIN JOINT PO) Take 1 tablet by mouth 2 (two) times  daily.    Marland Kitchen lidocaine (XYLOCAINE) 2 % jelly Place 1 application into the urethra daily as needed (pain). 30 mL 12  . Meth-Hyo-M Bl-Na Phos-Ph Sal (URIBEL) 118 MG CAPS TAKE 1-4 CAPSULES BY MOUTH EVERY DAY AS DIRECTED 360 capsule 0  . mirtazapine (REMERON) 45 MG tablet TAKE 1 TABLET BY MOUTH EVERY NIGHT AT BEDTIME 30 tablet 0  . Multiple Vitamin (MULTIVITAMIN) capsule Take 1 capsule by mouth daily.      . naftifine (NAFTIN) 1 % cream APPLY EXTERNALLY TO THE AFFECTED AREA DAILY 60 g 1  . neomycin-bacitracin-polymyxin (NEOSPORIN) ointment Apply 1 application topically daily as needed for wound care.     . nystatin-triamcinolone ointment (MYCOLOG) Apply topically 2 (two) times daily. 60 g 0  . ondansetron (ZOFRAN) 8 MG tablet TAKE 1 TABLET BY MOUTH EVERY 8 HOURS AS NEEDED FOR NAUSEA OR VOMITING 90 tablet 0  . OxyCODONE HCl, Abuse Deter, (OXAYDO) 5 MG TABA Take 5 mg by mouth every 30 (thirty) days. When your catheter is changed 6 tablet 0  . Saw Palmetto 450 MG CAPS Take by mouth QID.    Marland Kitchen torsemide (DEMADEX) 20 MG tablet Take 1 tablet (20 mg total) by mouth daily. Take 40mg  every morning and 80mg  every evening. 180 tablet 3  . traMADol (ULTRAM) 50 MG tablet TAKE 1 TABLET BY MOUTH EVERY 8 HOURS AS NEEDED 60 tablet 0  . triamcinolone cream (KENALOG) 0.1 % AAA TWICE DAILY AS NEEDED. DO NOT USE ON FACE, GROIN OR UNDERARMS 454 g 0  . busPIRone (BUSPAR) 5 MG tablet Take 1 and 1/2 tablets by mouth three times daily. 45 tablet 0   No current facility-administered medications for this encounter.    BP 126/74   Pulse (!) 102   Wt 90 kg (198 lb 6.4 oz)   SpO2 95%   BMI 26.91 kg/m  General: NAD Neck: No JVD, no thyromegaly or thyroid nodule.  Lungs: Clear to auscultation bilaterally with normal respiratory effort. CV: Nondisplaced PMI.  Heart regular S1/S2, no S3/S4, no murmur.  No peripheral edema.  No carotid bruit.  Normal pedal pulses.  Abdomen: Soft, nontender, no hepatosplenomegaly, no distention.   Skin: Intact without lesions or rashes.  Neurologic: Alert and oriented x 3.  Psych: Normal affect. Extremities: No clubbing or cyanosis.  HEENT: Normal.   Assessment/Plan: 1. Atrial fibrillation: Permanent.  Dilated atria on echo, unlikely to stay in NSR if DCCV were to be attempted. He is on apixaban 2.5 bid.  HR is controlled.   - CBC today.  2. Chronic systolic CHF with prominent RV failure:  In the past, echo had shown RV dysfunction but normal LV systolic function.  Echo in 6/17, however, showed EF 20-25% with wall motion abnormalities and moderately dilated RV.  Concern for ischemic cardiomyopathy with worsening of coronary disease, but he has had no event consistent with MI (chest, arm or neck pain) and his symptoms have been very stable.  Lexiscan Cardiolite in 8/17 showed only fixed  defects, no ischemia.  No evidence that he has tachycardia-mediated cardiomyopathy (rate has been controlled).  Echo in 3/19 showed EF 30-35%, D-shaped interventricular septum, dysfunctional RV, and moderate-severe TR.  NYHA class II symptoms currently, He does not appear volume overloaded on exam.  - Continue torsemide 40 qam/80 qpm.  BMET today and repeat in 3 months.     - Increase Coreg to 12.5 mg bid. - Continue eplerenone (did not tolerate spironolactone).  - Off lisinopril with rise in creatinine in the past to 2.7.  3. Tricuspid regurgitation: Moderate to severe TR on most recent echo with RV failure. 4. Ascites: Suspect this has been due hepatic congestion from RV failure.  Now resolved.  5. CKD: Stage 3.  HD would be difficult with RV failure. Most recent creatinine improved.   - BMET today.  6. CAD: s/p CABG.  No chest pain.  Continue statin.  No ASA with use of warfarin and stable CAD.  Lexiscan Cardiolite in 8/17 showed areas of infarction with no ischemia, given CKD would hold off on cardiac cath in absence of ischemia. - Check lipids today.    Followup in 6 months.   [name] 12/23/2017

## 2017-12-26 ENCOUNTER — Other Ambulatory Visit (HOSPITAL_COMMUNITY): Payer: Self-pay

## 2017-12-26 MED ORDER — CARVEDILOL 12.5 MG PO TABS: 12.5000 mg | ORAL_TABLET | Freq: Two times a day (BID) | ORAL | 5 refills | Status: DC

## 2017-12-28 ENCOUNTER — Ambulatory Visit: Payer: Medicare Other | Admitting: Family Medicine

## 2018-01-02 ENCOUNTER — Emergency Department (HOSPITAL_BASED_OUTPATIENT_CLINIC_OR_DEPARTMENT_OTHER)
Admission: EM | Admit: 2018-01-02 | Discharge: 2018-01-02 | Disposition: A | Discharge status: Home / Self Care | Payer: Medicare Other | Attending: Emergency Medicine | Admitting: Emergency Medicine

## 2018-01-02 ENCOUNTER — Encounter (HOSPITAL_BASED_OUTPATIENT_CLINIC_OR_DEPARTMENT_OTHER): Payer: Self-pay | Admitting: Emergency Medicine

## 2018-01-02 ENCOUNTER — Other Ambulatory Visit: Payer: Self-pay

## 2018-01-02 DIAGNOSIS — I130 Hypertensive heart and chronic kidney disease with heart failure and stage 1 through stage 4 chronic kidney disease, or unspecified chronic kidney disease: Secondary | ICD-10-CM | POA: Insufficient documentation

## 2018-01-02 DIAGNOSIS — I5042 Chronic combined systolic (congestive) and diastolic (congestive) heart failure: Secondary | ICD-10-CM | POA: Diagnosis not present

## 2018-01-02 DIAGNOSIS — I259 Chronic ischemic heart disease, unspecified: Secondary | ICD-10-CM | POA: Diagnosis not present

## 2018-01-02 DIAGNOSIS — N390 Urinary tract infection, site not specified: Secondary | ICD-10-CM | POA: Insufficient documentation

## 2018-01-02 DIAGNOSIS — Z7901 Long term (current) use of anticoagulants: Secondary | ICD-10-CM | POA: Insufficient documentation

## 2018-01-02 DIAGNOSIS — N184 Chronic kidney disease, stage 4 (severe): Secondary | ICD-10-CM | POA: Insufficient documentation

## 2018-01-02 DIAGNOSIS — Z79899 Other long term (current) drug therapy: Secondary | ICD-10-CM | POA: Diagnosis not present

## 2018-01-02 DIAGNOSIS — N319 Neuromuscular dysfunction of bladder, unspecified: Secondary | ICD-10-CM | POA: Insufficient documentation

## 2018-01-02 DIAGNOSIS — F419 Anxiety disorder, unspecified: Secondary | ICD-10-CM | POA: Insufficient documentation

## 2018-01-02 DIAGNOSIS — Z87891 Personal history of nicotine dependence: Secondary | ICD-10-CM | POA: Insufficient documentation

## 2018-01-02 DIAGNOSIS — Z436 Encounter for attention to other artificial openings of urinary tract: Secondary | ICD-10-CM | POA: Diagnosis present

## 2018-01-02 LAB — URINALYSIS, ROUTINE W REFLEX MICROSCOPIC
Bilirubin Urine: NEGATIVE
Glucose, UA: NEGATIVE mg/dL
Ketones, ur: NEGATIVE mg/dL
NITRITE: NEGATIVE
PH: 7 (ref 5.0–8.0)
Protein, ur: NEGATIVE mg/dL
Specific Gravity, Urine: 1.01 (ref 1.005–1.030)

## 2018-01-02 LAB — URINALYSIS, MICROSCOPIC (REFLEX): RBC / HPF: >50 RBC/hpf (ref 0–5)

## 2018-01-02 MED ORDER — DIAZEPAM 2 MG PO TABS
2.0000 mg | ORAL_TABLET | Freq: Once | ORAL | Status: AC
  Administered 2018-01-02: 2 mg via ORAL
  Filled 2018-01-02: qty 1

## 2018-01-02 MED ORDER — CEPHALEXIN 250 MG PO CAPS: 250.0000 mg | ORAL_CAPSULE | Freq: Three times a day (TID) | ORAL | 0 refills | Status: AC

## 2018-01-02 MED ORDER — LIDOCAINE HCL URETHRAL/MUCOSAL 2 % EX GEL
1.0000 "application " | Freq: Once | CUTANEOUS | Status: AC
  Administered 2018-01-02: 1 via URETHRAL
  Filled 2018-01-02: qty 20

## 2018-01-02 MED ORDER — CEPHALEXIN 250 MG PO CAPS
250.0000 mg | ORAL_CAPSULE | Freq: Once | ORAL | Status: AC
  Administered 2018-01-02: 250 mg via ORAL
  Filled 2018-01-02: qty 1

## 2018-01-02 MED ORDER — AEROCHAMBER PLUS FLO-VU LARGE MISC
1.0000 | Freq: Once | Status: AC
Start: 2018-01-02 — End: 2018-01-02
  Administered 2018-01-02: 1
  Filled 2018-01-02: qty 1

## 2018-01-02 NOTE — Discharge Instructions (Addendum)
Take Keflex as prescribed and complete the full course. Follow up with your doctor to discuss medication side effect concerns. Consider stopping the Tramadol. Return to ER for worsening or concerning symptoms.

## 2018-01-02 NOTE — ED Notes (Signed)
Removed old urinary catheter intact. Small amount of blood from penis noted. Pt tolerated well.

## 2018-01-02 NOTE — ED Provider Notes (Signed)
[facility] EMERGENCY DEPARTMENT Provider Note   CSN: [id number] Arrival date & time: 01/02/18  1609     History   Chief Complaint Chief Complaint  Patient presents with  . Catheter change  . Medication Problem    HPI Gary [name]. is a 84 y.o. male.  84 year old male brought in by daughter-in-law for Foley change and concerns regarding vivid dreams.  Patient states that he has had a Foley catheter for the past 18 years, manages at home with irrigating his Foley as needed and states that he has his Foley catheter changed every 2 months by home health.  Patient canceled his home health visit this week and is nervous that if he waits too long to have his catheter changed he will develop an infection or have bleeding.  Patient denies fevers, chills, abdominal pain, changes in the appearance or odor of his urine.  Patient lives with his daughter-in-law and son, family was concerned that patient has been reporting vivid dreams.  Patient reports high level of anxiety around visits to his doctor's due to experiences of the child, patient had visits to several doctors within the past 2 weeks and states he has been very emotionally worked up in anticipation of the appointments had hasn't seemed himself since. No changes in medications recently. Patient takes Tramadol for pain once daily. No other complaints or concerns.      Past Medical History:  Diagnosis Date  . Anemia   . Anxiety   . Arrhythmia    afib  . CHF (congestive heart failure) ([place])   . Chronic systolic heart failure (HCC)    a. prior EF 20-30%;  b. Echo 7/09: EF 35-40%, basal inferoposterior HK, mild AI, mild to moderate MR, moderate to marked LAE, mild to moderate TR, mildly increased PASP, moderate RAE;   c. Echo 01/2012:   mild LVH, EF 50%, Tr AI, mild MR, severe LAE, mod to severe RAE, mod to severe RVE, mod reduced RVSF, mod to severe TR, PASP 46  . Coronary artery disease   . GERD (gastroesophageal reflux  disease)   . Heart attack ([place])    1983  . Hyperlipidemia   . Hypertension   . Neurogenic bladder    has had bladder catheter changes at the urology office every 12 weeks  . Renal failure   . Warfarin anticoagulation     Patient Active Problem List   Diagnosis Date Noted  . Arthritis of multiple sites 03/09/2017  . Neurogenic bladder disorder 03/09/2017  . Depression 03/09/2017  . Anxiety 03/09/2017  . Anemia-transfused 07/15/14 07/18/2014  . CKD (chronic kidney disease), stage IV ([place]) 07/15/2014  . Acute on chronic systolic and diastolic heart failure, NYHA class 4 ([place]) 07/14/2014  . Long-term (current) use of anticoagulants 07/08/2014  . Right heart failure ([place]) 06/16/2013  . Valvular heart disease - moderate MR, mild AI, severe TR 06/16/2013  . Pancreatic lesion 06/16/2013  . Finding of prostate - prominent by CT 05/2013 06/16/2013  . CKD (chronic kidney disease), stage III ([place]) 06/16/2013  . Chronic combined systolic and diastolic heart failure ([place]) 01/15/2013  . CAD (coronary artery disease) 01/15/2013  . Other malaise and fatigue 07/01/2012  . Recurrent UTI 04/18/2011  . Hematuria 04/18/2011  . Cellulitis 07/13/2010  . LBBB (left bundle branch block) 06/16/2009  . CARDIOMYOPATHY, ISCHEMIC 06/17/2007  . Renal failure 03/18/2007  . Adjustment disorder with mixed anxiety and depressed mood 12/12/2006  . Permanent atrial fibrillation 10/04/2006  .  Hyperlipidemia 09/10/2006  . ANEMIA NOS 09/10/2006  . Essential hypertension-now hypotensive 09/10/2006  . GERD 09/10/2006  . URINARY INCONTINENCE 09/10/2006    Past Surgical History:  Procedure Laterality Date  . APPENDECTOMY    . CHOLECYSTECTOMY     1971  . CORONARY ARTERY BYPASS GRAFT     1995        Home Medications    Prior to Admission medications   Medication Sig Start Date End Date Taking? Authorizing Provider  apixaban (ELIQUIS) 2.5 MG TABS tablet Take 1 tablet (2.5 mg total) by mouth 2 (two) times  daily. 10/23/17   Larey Dresser, MD  atorvastatin (LIPITOR) 20 MG tablet Take 1 tablet (20 mg total) by mouth daily. 10/23/17   Larey Dresser, MD  busPIRone (BUSPAR) 5 MG tablet Take 1 and 1/2 tablets by mouth three times daily. 05/01/17   Libby Maw, MD  carvedilol (COREG) 12.5 MG tablet Take 1 tablet (12.5 mg total) by mouth 2 (two) times daily with a meal. 12/26/17   Larey Dresser, MD  cephALEXin (KEFLEX) 250 MG capsule Take 1 capsule (250 mg total) by mouth 3 (three) times daily for 10 days. 01/02/18 01/12/18  Tacy Learn, PA-C  eplerenone (INSPRA) 25 MG tablet Take 1 tablet (25 mg total) by mouth daily. 05/29/17   Larey Dresser, MD  escitalopram (LEXAPRO) 20 MG tablet Take 1 tablet (20 mg total) by mouth daily. 05/01/17   Libby Maw, MD  ferrous sulfate 325 (65 FE) MG tablet Take 325 mg by mouth daily with breakfast.     [provider]  fluticasone (CUTIVATE) 0.05 % cream  12/27/17   [provider]  folic acid (FOLVITE) 1 MG tablet Take 1 mg by mouth daily.      [provider]  Glucos-Chondroit-Hyaluron-MSM (GLUCOSAMINE CHONDROITIN JOINT PO) Take 1 tablet by mouth 2 (two) times daily.    [provider]  hydrOXYzine (ATARAX/VISTARIL) 25 MG tablet TK 1 T PO QD PRN 10/10/17   [provider]  lidocaine (XYLOCAINE) 2 % jelly Place 1 application into the urethra daily as needed (pain). 03/09/17   Libby Maw, MD  Meth-Hyo-M Bl-Na Phos-Ph Sal (URIBEL) 118 MG CAPS TAKE 1-4 CAPSULES BY MOUTH EVERY DAY AS DIRECTED 07/06/16   Carollee Herter, Alferd Apa, DO  mirtazapine (REMERON) 45 MG tablet TAKE 1 TABLET BY MOUTH EVERY NIGHT AT BEDTIME 07/20/16   Ann Held, DO  Multiple Vitamin (MULTIVITAMIN) capsule Take 1 capsule by mouth daily.      [provider]  naftifine (NAFTIN) 1 % cream APPLY EXTERNALLY TO THE AFFECTED AREA DAILY 03/15/17   Libby Maw, MD  neomycin-bacitracin-polymyxin (NEOSPORIN)  ointment Apply 1 application topically daily as needed for wound care.     [provider]  nystatin-triamcinolone ointment (MYCOLOG) Apply topically 2 (two) times daily. 04/11/16   Carollee Herter, Yvonne R, DO  ondansetron (ZOFRAN) 8 MG tablet TAKE 1 TABLET BY MOUTH EVERY 8 HOURS AS NEEDED FOR NAUSEA OR VOMITING 07/31/16   Carollee Herter, Yvonne R, DO  Saw Palmetto 450 MG CAPS Take by mouth QID.    [provider]  torsemide (DEMADEX) 20 MG tablet Take 1 tablet (20 mg total) by mouth daily. Take 40mg  every morning and 80mg  every evening. 09/18/17   Larey Dresser, MD  traMADol Veatrice Bourbon) 50 MG tablet TAKE 1 TABLET BY MOUTH EVERY 8 HOURS AS NEEDED 09/06/17   Libby Maw, MD  Family History Family History  Problem Relation Age of Onset  . Cancer Mother   . Heart attack Father        deceased at 62, smoking, ETOH abuse  . Breast cancer Sister     Social History Social History   Tobacco Use  . Smoking status: Former Research scientist (life sciences)  . Smokeless tobacco: Never Used  Substance Use Topics  . Alcohol use: No  . Drug use: No     Allergies   Amoxicillin; Diltiazem hcl; Hydrocodone-acetaminophen; Niacin; Niacin-lovastatin er; Paroxetine; Propoxyphene n-acetaminophen; Pseudoephedrine; Spironolactone; Sulfonamide derivatives; and Tolterodine tartrate   Review of Systems Review of Systems   Physical Exam Updated Vital Signs BP 132/90   Pulse 88   Temp 98.4 F (36.9 C) (Oral)   Resp 16   Ht 6' (1.829 m)   Wt 85.7 kg   SpO2 100%   BMI 25.63 kg/m   Physical Exam   ED Treatments / Results  Labs (all labs ordered are listed, but only abnormal results are displayed) Labs Reviewed  URINALYSIS, ROUTINE W REFLEX MICROSCOPIC - Abnormal; Notable for the following components:      Result Value   APPearance HAZY (*)    Hgb urine dipstick LARGE (*)    Leukocytes, UA MODERATE (*)    All other components within normal limits  URINALYSIS, MICROSCOPIC (REFLEX) - Abnormal;  Notable for the following components:   Bacteria, UA FEW (*)    All other components within normal limits  URINE CULTURE    EKG None  Radiology No results found.  Procedures Procedures (including critical care time)  Medications Ordered in ED Medications  diazepam (VALIUM) tablet 2 mg (2 mg Oral Given 01/02/18 1710)  AEROCHAMBER PLUS FLO-VU LARGE MISC 1 each (1 each Other Given 01/02/18 1834)  lidocaine (XYLOCAINE) 2 % jelly 1 application (1 application Urethral Given 01/02/18 1710)  cephALEXin (KEFLEX) capsule 250 mg (250 mg Oral Given 01/02/18 1835)     Initial Impression / Assessment and Plan / ED Course  I have reviewed the triage vital signs and the nursing notes.  Pertinent labs & imaging results that were available during my care of the patient were reviewed by me and considered in my medical decision making (see chart for details).  Clinical Course as of Jan 03 1920  Wed Jan 02, 4174  [id number] 84 year old male brought in by family for Foley catheter change.  Foley catheter was changed, sample obtained from new Foley with urinary tract infection evident.  Patient treated with Keflex, renally dosed.  Sent for culture.  Patient to follow-up with his PCP regarding concerns for vivid dreams with his medications.  Return to ER for any worsening or concerning symptoms.   [LM]    Clinical Course User Index [LM] Tacy Learn, PA-C   Final Clinical Impressions(s) / ED Diagnoses   Final diagnoses:  Complicated UTI (urinary tract infection)    ED Discharge Orders         Ordered    cephALEXin (KEFLEX) 250 MG capsule  3 times daily     01/02/18 1821           Tacy Learn, PA-C 01/02/18 1921    Lennice Sites, DO 01/02/18 2310

## 2018-01-02 NOTE — ED Notes (Signed)
ED Provider at bedside. 

## 2018-01-02 NOTE — ED Notes (Signed)
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

## 2018-01-02 NOTE — ED Triage Notes (Signed)
Pt states he is here for catheter change and possible UTI.  Pt also is having some issues with memory which may be related to his medications.  Pt is having dreams and wakes up believing that what happened had occurred.  Possible confusion.  Pt was sent by home health for evaluation.

## 2018-01-04 ENCOUNTER — Encounter: Payer: Self-pay | Admitting: Family Medicine

## 2018-01-04 ENCOUNTER — Ambulatory Visit: Payer: Medicare Other | Admitting: Family Medicine

## 2018-01-04 VITALS — BP 126/80 | HR 79 | Temp 97.2°F | Ht 72.0 in | Wt 190.2 lb

## 2018-01-04 DIAGNOSIS — Z960 Presence of urogenital implants: Secondary | ICD-10-CM | POA: Diagnosis not present

## 2018-01-04 DIAGNOSIS — R143 Flatulence: Secondary | ICD-10-CM | POA: Diagnosis not present

## 2018-01-04 DIAGNOSIS — Z978 Presence of other specified devices: Secondary | ICD-10-CM

## 2018-01-04 DIAGNOSIS — R110 Nausea: Secondary | ICD-10-CM

## 2018-01-04 HISTORY — DX: Nausea: R11.0

## 2018-01-04 HISTORY — DX: Presence of other specified devices: Z97.8

## 2018-01-04 LAB — URINE CULTURE: Culture: 80000 — AB

## 2018-01-04 MED ORDER — DIAZEPAM 5 MG PO TABS: ORAL_TABLET | ORAL | 0 refills | Status: DC

## 2018-01-04 MED ORDER — ATORVASTATIN CALCIUM 20 MG PO TABS: 20.0000 mg | ORAL_TABLET | Freq: Every day | ORAL | 1 refills | Status: DC

## 2018-01-04 MED ORDER — OMEPRAZOLE 20 MG PO CPDR: 20.0000 mg | DELAYED_RELEASE_CAPSULE | Freq: Two times a day (BID) | ORAL | 1 refills | Status: DC

## 2018-01-04 NOTE — Progress Notes (Signed)
Chief Complaint  Patient presents with  . Hospitalization Follow-up  . New Patient (Initial Visit)       New Patient Visit SUBJECTIVE: HPI: Gary [name]. is an 84 y.o.male who is being seen for establishing care.  The patient was previously seen by home health. Here w daughter.   Patient has a neurogenic bladder with an indwelling Foley catheter.  He changes every 60 days.  Home health comes by and helps him with this.  He has great pain when they insert the Foley catheter despite lidocaine jelly.  It gives him quite a deal of anxiety.  Tramadol was prescribed to help with this, however it did not help with the pain and gave him confusion.  He was prescribed Valium in the past that was helpful.  He was recently diagnosed with a urinary tract infection in the emergency department and is currently taking antibiotic for this.  Patient has a history of nausea over the past several years that he takes Zofran intermittently for.  He also has a history of reflux but is not taking any medication.  He will sometimes get flatulence and with "explosive bowel movements" in the morning.  He tried Gas-X without relief.  Denies any dietary triggers, however has not been keeping a close tab on this.  Allergies  Allergen Reactions  . Amoxicillin     REACTION: ha/nausea  . Diltiazem Hcl     REACTION: low bp  . Hydrocodone-Acetaminophen     REACTION: ha  . Niacin     REACTION: ha/nausea  . Niacin-Lovastatin Er     REACTION: ha/nausea  . Paroxetine     REACTION: ha/disoriented  . Propoxyphene N-Acetaminophen     REACTION: headache  . Pseudoephedrine     REACTION: stopped urine flow  . Spironolactone Itching  . Sulfonamide Derivatives     REACTION: ha/nausea  . Tolterodine Tartrate     REACTION: bladder stopped    Past Medical History:  Diagnosis Date  . Anemia   . Anxiety   . Arrhythmia    afib  . Chronic systolic heart failure (HCC)    a. prior EF 20-30%;  b. Echo 7/09: EF 35-40%,  basal inferoposterior HK, mild AI, mild to moderate MR, moderate to marked LAE, mild to moderate TR, mildly increased PASP, moderate RAE;   c. Echo 01/2012:   mild LVH, EF 50%, Tr AI, mild MR, severe LAE, mod to severe RAE, mod to severe RVE, mod reduced RVSF, mod to severe TR, PASP 46  . Coronary artery disease   . GERD (gastroesophageal reflux disease)   . Heart attack ([place])    1983  . Hyperlipidemia   . Hypertension   . Neurogenic bladder    has had bladder catheter changes at the urology office every 60 d  . Renal failure   . Warfarin anticoagulation    Past Surgical History:  Procedure Laterality Date  . APPENDECTOMY    . CHOLECYSTECTOMY     1971  . CORONARY ARTERY BYPASS GRAFT     1995   Family History  Problem Relation Age of Onset  . Cancer Mother   . Heart attack Father        deceased at 70, smoking, ETOH abuse  . Breast cancer Sister      Current Outpatient Medications:  .  apixaban (ELIQUIS) 2.5 MG TABS tablet, Take 1 tablet (2.5 mg total) by mouth 2 (two) times daily., Disp: 180 tablet, Rfl: 1 .  atorvastatin (  LIPITOR) 20 MG tablet, Take 1 tablet (20 mg total) by mouth daily., Disp: 90 tablet, Rfl: 1 .  carvedilol (COREG) 12.5 MG tablet, Take 1 tablet (12.5 mg total) by mouth 2 (two) times daily with a meal., Disp: 60 tablet, Rfl: 5 .  cephALEXin (KEFLEX) 250 MG capsule, Take 1 capsule (250 mg total) by mouth 3 (three) times daily for 10 days., Disp: 30 capsule, Rfl: 0 .  eplerenone (INSPRA) 25 MG tablet, Take 1 tablet (25 mg total) by mouth daily., Disp: 90 tablet, Rfl: 3 .  escitalopram (LEXAPRO) 20 MG tablet, Take 1 tablet (20 mg total) by mouth daily., Disp: 30 tablet, Rfl: 1 .  ferrous sulfate 325 (65 FE) MG tablet, Take 325 mg by mouth daily with breakfast. , Disp: , Rfl:  .  fluticasone (CUTIVATE) 0.05 % cream, , Disp: , Rfl:  .  folic acid (FOLVITE) 1 MG tablet, Take 1 mg by mouth daily.  , Disp: , Rfl:  .  Glucos-Chondroit-Hyaluron-MSM (GLUCOSAMINE  CHONDROITIN JOINT PO), Take 1 tablet by mouth 2 (two) times daily., Disp: , Rfl:  .  hydrOXYzine (ATARAX/VISTARIL) 25 MG tablet, TK 1 T PO QD PRN, Disp: , Rfl: 0 .  lidocaine (XYLOCAINE) 2 % jelly, Place 1 application into the urethra daily as needed (pain)., Disp: 30 mL, Rfl: 12 .  Meth-Hyo-M Bl-Na Phos-Ph Sal (URIBEL) 118 MG CAPS, TAKE 1-4 CAPSULES BY MOUTH EVERY DAY AS DIRECTED, Disp: 360 capsule, Rfl: 0 .  Multiple Vitamin (MULTIVITAMIN) capsule, Take 1 capsule by mouth daily.  , Disp: , Rfl:  .  ondansetron (ZOFRAN) 8 MG tablet, TAKE 1 TABLET BY MOUTH EVERY 8 HOURS AS NEEDED FOR NAUSEA OR VOMITING, Disp: 90 tablet, Rfl: 0 .  Saw Palmetto 450 MG CAPS, Take by mouth QID., Disp: , Rfl:  .  torsemide (DEMADEX) 20 MG tablet, Take 1 tablet (20 mg total) by mouth daily. Take 40mg  every morning and 80mg  every evening., Disp: 180 tablet, Rfl: 3 .  diazepam (VALIUM) 5 MG tablet, Take 1/2 to a full time 15 minutes prior to catheter change., Disp: 20 tablet, Rfl: 0 .  omeprazole (PRILOSEC) 20 MG capsule, Take 1 capsule (20 mg total) by mouth 2 (two) times daily before a meal., Disp: 60 capsule, Rfl: 1  ROS GI: +intermittent nausea  GU: Denies current dysuria   OBJECTIVE: BP 126/80 (Patient Position: Sitting, Cuff Size: Normal)   Pulse 79   Temp (!) 97.2 F (36.2 C) (Oral)   Ht 6' (1.829 m)   Wt 190 lb 4 oz (86.3 kg)   SpO2 94%   BMI 25.80 kg/m   Constitutional: -  VS reviewed -  Well developed, well nourished, appears stated age -  No apparent distress  Psychiatric: -  Oriented to person, place, and time -  Memory intact -  Affect and mood normal -  Fluent conversation, good eye contact -  Judgment and insight age appropriate  Eye: -  Conjunctivae clear, no discharge -  Pupils symmetric, round, reactive to light  ENMT: -  MMM    Pharynx moist, no exudate, no erythema  Neck: -  No gross swelling, no palpable masses -  Thyroid midline, not enlarged, mobile, no palpable masses   Cardiovascular: -  RRR -  No LE edema  Respiratory: -  Normal respiratory effort, no accessory muscle use, no retraction -  Breath sounds equal, no wheezes, no ronchi, no crackles  Gastrointestinal: -  Bowel sounds normal -  No tenderness,  no distention, no guarding, no masses  Skin: -  No significant lesion on inspection -  Warm and dry to palpation   ASSESSMENT/PLAN: Nausea - Plan: omeprazole (PRILOSEC) 20 MG capsule  Chronic indwelling Foley catheter - Plan: diazepam (VALIUM) 5 MG tablet  Flatulence  I do not know the cause of his nausea, but suspect it is related to reflux.  Start twice daily PPI for the next 2 months.  Follow-up in 2 months to see how he is doing.  Consider ginger for nausea. Will prescribe Valium 15 minutes prior to Foley change.  Discussed the risks and benefits of this. Recommended keeping a food journal to track symptoms associated with types of foods for flatulence and with his nausea.  Recommended Beano for gas relief instead of simethicone. Patient should return in 2 mo. The patient and his daughter voiced understanding and agreement to the plan.   [place], DO 01/04/18  4:11 PM

## 2018-01-04 NOTE — Patient Instructions (Addendum)
Let's try Prilosec to see if that helps with your nausea. Our goal is to come off of the Zofran.   Consider ginger for nausea.  The only lifestyle changes that have data behind them are weight loss for the overweight/obese and elevating the head of the bed. Finding out which foods/positions are triggers is important.  Consider Bean-O for gas. Try not to eat a lot of gas producing foods.   Let us know if you need anything.

## 2018-01-04 NOTE — Progress Notes (Signed)
Pre visit review using our clinic review tool, if applicable. No additional management support is needed unless otherwise documented below in the visit note. 

## 2018-01-07 ENCOUNTER — Ambulatory Visit: Payer: Self-pay

## 2018-01-07 NOTE — Telephone Encounter (Signed)
Pt. States he is still having nausea. Thinks his antibiotic is causing this, but also reports he had nausea prior to the antibiotic. Asked pt. If he started Prilosec that his provider prescribed. He states he is not sure because his children fix his medications. States he has stopped taking Tramadol and is having withdrawal from that as well. Will ask his family if he is taking the Prilosec. Instructed to call back if he does not have it.  Answer Assessment - Initial Assessment Questions 1. NAUSEA SEVERITY: "How bad is the nausea?" (e.g., mild, moderate, severe; dehydration, weight loss)   - MILD: loss of appetite without change in eating habits   - MODERATE: decreased oral intake without significant weight loss, dehydration, or malnutrition   - SEVERE: inadequate caloric or fluid intake, significant weight loss, symptoms of dehydration     Moderate 2. ONSET: "When did the nausea begin?"     Started 2 weeks ago 3. VOMITING: "Any vomiting?" If so, ask: "How many times today?"     No 4. RECURRENT SYMPTOM: "Have you had nausea before?" If so, ask: "When was the last time?" "What happened that time?"     Yes 5. CAUSE: "What do you think is causing the nausea?"     Unsure 6. PREGNANCY: "Is there any chance you are pregnant?" (e.g., unprotected intercourse, missed birth control pill, broken condom)     No  Protocols used: NAUSEA-A-AH

## 2018-01-15 ENCOUNTER — Other Ambulatory Visit (HOSPITAL_COMMUNITY): Payer: Self-pay

## 2018-01-15 DIAGNOSIS — I5042 Chronic combined systolic (congestive) and diastolic (congestive) heart failure: Secondary | ICD-10-CM

## 2018-01-15 MED ORDER — TORSEMIDE 20 MG PO TABS: 20.0000 mg | ORAL_TABLET | Freq: Every day | ORAL | 3 refills | Status: DC

## 2018-01-16 ENCOUNTER — Ambulatory Visit: Payer: Medicare Other | Admitting: Family Medicine

## 2018-01-17 ENCOUNTER — Telehealth: Payer: Self-pay | Admitting: Family Medicine

## 2018-01-17 NOTE — Telephone Encounter (Signed)
Copied from [place] [id number]. Topic: Quick Communication - See Telephone Encounter >> Jan 17, 2018  3:57 PM Ivar Drape wrote: CRM for notification. See Telephone encounter for: 01/17/18. Mendel Ryder w/[facility] [phone] is requesting a urine sample and a lab for the following tests:  1) Renal Function 2) Hemoglobin 3) Intact PTH

## 2018-01-17 NOTE — Telephone Encounter (Signed)
Why?  

## 2018-01-17 NOTE — Telephone Encounter (Signed)
Gary [name] with [facility] left message to call back to clarify?

## 2018-01-18 NOTE — Telephone Encounter (Signed)
Called left [facility] detailed message of PCP request.

## 2018-01-18 NOTE — Telephone Encounter (Signed)
Why did they order those specific tests?

## 2018-01-18 NOTE — Telephone Encounter (Signed)
HHRN with [facility] states previous MD had a standing order with them to get from this patient Renal function Intact PTH Hemoglobin They are requesting the same from new PCP= Just need a verbal to give a standing order?

## 2018-01-29 ENCOUNTER — Other Ambulatory Visit: Payer: Self-pay | Admitting: Family Medicine

## 2018-01-31 ENCOUNTER — Other Ambulatory Visit (HOSPITAL_COMMUNITY): Payer: Self-pay

## 2018-01-31 ENCOUNTER — Other Ambulatory Visit: Payer: Self-pay | Admitting: Family Medicine

## 2018-01-31 DIAGNOSIS — I5042 Chronic combined systolic (congestive) and diastolic (congestive) heart failure: Secondary | ICD-10-CM

## 2018-01-31 DIAGNOSIS — R110 Nausea: Secondary | ICD-10-CM

## 2018-01-31 MED ORDER — EPLERENONE 25 MG PO TABS: 25.0000 mg | ORAL_TABLET | Freq: Every day | ORAL | 3 refills | Status: DC

## 2018-01-31 MED ORDER — CARVEDILOL 12.5 MG PO TABS: 12.5000 mg | ORAL_TABLET | Freq: Two times a day (BID) | ORAL | 3 refills | Status: DC

## 2018-01-31 MED ORDER — TORSEMIDE 20 MG PO TABS: 20.0000 mg | ORAL_TABLET | Freq: Every day | ORAL | 3 refills | Status: DC

## 2018-01-31 MED ORDER — TORSEMIDE 20 MG PO TABS: ORAL_TABLET | ORAL | 3 refills | Status: DC

## 2018-01-31 NOTE — Telephone Encounter (Signed)
[facility] SW please help, this pt est with Dr. Nani Ravens on 12/2017.

## 2018-01-31 NOTE — Telephone Encounter (Signed)
Requested medication (s) are due for refill today: Yes  Requested medication (s) are on the active medication list: Yes  Last refill:  Uribell-07/06/16; Omeprazole 01/04/18; Zofran 07/31/16; Lexapro 01/29/18  Future visit scheduled: Not with Dr. Ethelene Hal  Notes to clinic:  Please send to Dr. Ethelene Hal for refilling.     Requested Prescriptions  Pending Prescriptions Disp Refills   omeprazole (PRILOSEC) 20 MG capsule 60 capsule 1    Sig: Take 1 capsule (20 mg total) by mouth 2 (two) times daily before a meal.     Gastroenterology: Proton Pump Inhibitors Passed - 01/31/2018 12:52 PM      Passed - Valid encounter within last 12 months    Recent Outpatient Visits          3 weeks ago Nausea   [facility] at [place], [place], [place]   10 months ago Arthritis of multiple sites   [facility] [place], Mortimer Fries, MD   10 months ago Arthritis   [facility] [place], Mortimer Fries, MD   2 years ago Chronic pain syndrome   [facility] at [place], [place]   2 years ago Tinea corporis   [facility]ivist at [place], DO      Future Appointments            In 1 month [place], Crosby Oyster, [place] at [facility]          escitalopram (LEXAPRO) 20 MG tablet 30 tablet 0     Psychiatry:  Antidepressants - SSRI Failed - 01/31/2018 12:52 PM      Failed - Completed PHQ-2 or PHQ-9 in the last 360 days.      Passed - Valid encounter within last 6 months    Recent Outpatient Visits          3 weeks ago Nausea   [facility] at [place], [place], [place]   10 months ago Arthritis of multiple sites   [facility] [place], Mortimer Fries, MD   10 months ago Arthritis   [facility] [place], Mortimer Fries, MD   2 years ago Chronic pain syndrome   Archivist at [place], [place]   2 years ago Tinea corporis   Archivist at [place], DO      Future Appointments            In 1 month [place], Crosby Oyster, [place] at [facility], [facility]          ondansetron (ZOFRAN) 8 MG tablet 90 tablet 0     Not Delegated - Gastroenterology: Antiemetics Failed - 01/31/2018 12:52 PM      Failed - This refill cannot be delegated      Passed - Valid encounter within last 6 months    Recent Outpatient Visits          3 weeks ago Nausea   [facility] at [place], [place], [place]   10 months ago Arthritis of multiple sites   [facility] [place], Mortimer Fries, MD   10 months ago Arthritis   [facility] [place], Mortimer Fries, MD   2 years  ago Chronic pain syndrome   Archivist at [place], [place]   2 years ago Tinea corporis   Archivist at [place], DO      Future Appointments            In 1 month [place], Crosby Oyster, [place] at [facility], [place]          Meth-Hyo-M Bl-Na Phos-Ph Sal (URIBEL) 118 MG CAPS 360 capsule 0     Urology:  Bladder Analgesics Passed - 01/31/2018 12:52 PM      Passed - Valid encounter within last 6 months    Recent Outpatient Visits          3 weeks ago Nausea   [facility] at [place], [place], [place]   10 months ago Arthritis of multiple sites   [facility] [place], Mortimer Fries, MD   10 months ago Arthritis   [facility] [place], Mortimer Fries, MD   2 years ago Chronic pain syndrome   Archivist at  [place], [place]   2 years ago Tinea corporis   Archivist at [place], DO      Future Appointments            In 1 month [place], Crosby Oyster, [place] at [facility], [place]

## 2018-01-31 NOTE — Telephone Encounter (Signed)
Copied from [place] [phone]. Topic: Quick Communication - Rx Refill/Question >> Jan 31, 2018 12:33 PM Rutherford Nail, NT wrote: Abran Richard with [facility] pharmacy calling and is requesting new prescriptions for the following medications. Advised that he has some refills at [facility] for a few of the medications and she states that they have requested those to be transferred, but have not heard anything back from the pharmacy. Send to Dr Ethelene Hal at [facility] per [facility], Raquel Sarna, due to patient not keeping his New Patient appointment with Dr Nani Ravens.**  Medication: omeprazole (PRILOSEC) 20 MG capsule escitalopram (LEXAPRO) 20 MG tablet ondansetron (ZOFRAN) 8 MG tablet Meth-Hyo-M Bl-Na Phos-Ph Sal (URIBEL) 118 MG CAPS   Has the patient contacted their pharmacy? Yes.   (Agent: If no, request that the patient contact the pharmacy for the refill.) (Agent: If yes, when and what did the pharmacy advise?)  Preferred Pharmacy (with phone number or street name): [place], [place]  CB#: [phone]  Agent: Please be advised that RX refills may take up to 3 business days. We ask that you follow-up with your pharmacy.

## 2018-02-05 ENCOUNTER — Other Ambulatory Visit (HOSPITAL_COMMUNITY): Payer: Self-pay

## 2018-02-05 MED ORDER — APIXABAN 2.5 MG PO TABS: 2.5000 mg | ORAL_TABLET | Freq: Two times a day (BID) | ORAL | 1 refills | Status: DC

## 2018-02-05 MED ORDER — ATORVASTATIN CALCIUM 20 MG PO TABS: 20.0000 mg | ORAL_TABLET | Freq: Every day | ORAL | 1 refills | Status: DC

## 2018-02-11 ENCOUNTER — Telehealth: Payer: Self-pay | Admitting: Family Medicine

## 2018-02-11 NOTE — Telephone Encounter (Signed)
Copied from [place] [phone]. Topic: Quick Communication - Rx Refill/Question >> Feb 11, 2018  9:27 AM Virl Axe D wrote: Medication: omeprazole (PRILOSEC) 20 MG capsule / Cora stated they sent a fax to see if this medication could be chaned to Pepcid or Xantac because of risk factors of long term use of omeprazole. Please advise.  Has the patient contacted their pharmacy? Yes (Agent: If no, request that the patient contact the pharmacy for the refill.) (Agent: If yes, when and what did the pharmacy advise?)  Preferred Pharmacy (with phone number or street name): [place], [place] [phone] (Phone) [phone] (Fax)    Agent: Please be advised that RX refills may take up to 3 business days. We ask that you follow-up with your pharmacy.

## 2018-02-11 NOTE — Telephone Encounter (Signed)
This patient is not Dr. Irene Limbo patient any longer. Will let the pharmacy know

## 2018-02-11 NOTE — Telephone Encounter (Signed)
Pharmacy is requesting a change in medication due to long term use risk factors- please have PCP review for change

## 2018-02-13 ENCOUNTER — Telehealth: Payer: Self-pay | Admitting: *Deleted

## 2018-02-13 ENCOUNTER — Encounter

## 2018-02-13 ENCOUNTER — Ambulatory Visit: Payer: Medicare Other | Admitting: Family Medicine

## 2018-02-13 NOTE — Telephone Encounter (Signed)
Received Physician Orders from [facility]; forwarded to provider/SLS 12/18

## 2018-02-14 ENCOUNTER — Telehealth: Payer: Self-pay | Admitting: *Deleted

## 2018-02-14 NOTE — Telephone Encounter (Signed)
Received Home Health Certification and Plan of Care; forwarded to provider/SLS 12/19  

## 2018-02-21 ENCOUNTER — Other Ambulatory Visit: Payer: Self-pay | Admitting: Family Medicine

## 2018-02-21 DIAGNOSIS — R110 Nausea: Secondary | ICD-10-CM

## 2018-02-22 NOTE — ED Provider Notes (Signed)
Addendum to previous chart, January 02, 2018. Physical exam: Well-appearing elderly male, normocephalic atraumatic. Mucous membranes moist. Lungs clear heart regular rate and rhythm.  Abdomen is soft and nontender, Foley catheter in place without drainage or signs of infection. Patient alert and oriented x3. No rashes evident on skin.    Tacy Learn, PA-C 02/22/18 1938    Lennice Sites, DO 02/23/18 [id number]

## 2018-02-25 ENCOUNTER — Telehealth: Payer: Self-pay | Admitting: *Deleted

## 2018-02-25 NOTE — Telephone Encounter (Signed)
Received Missed Visit Note for review from [facility] [GBO]; forwarded to provider/SLS 12/30

## 2018-03-06 ENCOUNTER — Telehealth: Payer: Self-pay | Admitting: *Deleted

## 2018-03-06 NOTE — Telephone Encounter (Signed)
Received Physician Orders from [facility]; forwarded to provider/SLS 01/08

## 2018-03-08 ENCOUNTER — Encounter: Payer: Self-pay | Admitting: Family Medicine

## 2018-03-08 ENCOUNTER — Ambulatory Visit: Payer: Medicare Other | Admitting: Family Medicine

## 2018-03-08 VITALS — BP 108/62 | HR 75 | Temp 98.6°F | Ht 72.0 in | Wt 192.0 lb

## 2018-03-08 DIAGNOSIS — R310 Gross hematuria: Secondary | ICD-10-CM

## 2018-03-08 DIAGNOSIS — K219 Gastro-esophageal reflux disease without esophagitis: Secondary | ICD-10-CM

## 2018-03-08 DIAGNOSIS — Z960 Presence of urogenital implants: Secondary | ICD-10-CM | POA: Diagnosis not present

## 2018-03-08 DIAGNOSIS — R110 Nausea: Secondary | ICD-10-CM

## 2018-03-08 DIAGNOSIS — R142 Eructation: Secondary | ICD-10-CM | POA: Diagnosis not present

## 2018-03-08 DIAGNOSIS — Z978 Presence of other specified devices: Secondary | ICD-10-CM

## 2018-03-08 NOTE — Progress Notes (Signed)
Pre visit review using our clinic review tool, if applicable. No additional management support is needed unless otherwise documented below in the visit note. 

## 2018-03-08 NOTE — Patient Instructions (Addendum)
Take omeprazole at night before bed.   If you do not hear anything about your referral in the next 1-2 weeks, call our office and ask for an update.  Stop chewing gum, drinking carbonated beverages, gulping liquids, and drinking alcohol to help with belching.  These foods may cause you to belch more: Wheat, barley, rye, onion, leek, white part of spring onion, garlic, shallots, artichokes, beetroot, fennel, peas, chicory, pistachio, cashews, legumes, lentils, and chickpeas; Milk, custard, ice cream, and yogurt; Apples, pears, mangoes, cherries, watermelon, asparagus, sugar snap peas, honey, high-fructose corn syrup; Apricots, nectarines, peaches, plums, mushrooms, cauliflower, artificially sweetened chewing gum and confectionery.  Let us know if you need anything.

## 2018-03-08 NOTE — Progress Notes (Signed)
Chief Complaint  Patient presents with  . Follow-up    Subjective: Patient is a 85 y.o. male here for f/u nausea. Gary [name], Gary [name], helps provide history.   Omeprazole 20 mg daily was helpful. Still having some belching and epigastric fullness in AM. Is not taking daily. No AE's.  Reports diet is fair overall and he gets most of Gary nutrition from fruits and vegetables.  Gary [name] seems to disagree with Gary claim.  Patient has Gary indwelling Foley catheter changed routinely by home health nursing.  A new home health aide recently came to change Gary catheter.  There is apparently an issue with this and he had extreme pain.  The catheter was removed and some blood came out along with some clots.  He is on Eliquis twice daily.  He does not follow with the urologist routinely.  ROS: GI: +belching Const: No fevers  Past Medical History:  Diagnosis Date  . Anemia   . Anxiety   . Arrhythmia    afib  . Chronic systolic heart failure (HCC)    a. prior EF 20-30%;  b. Echo 7/09: EF 35-40%, basal inferoposterior HK, mild AI, mild to moderate MR, moderate to marked LAE, mild to moderate TR, mildly increased PASP, moderate RAE;   c. Echo 01/2012:   mild LVH, EF 50%, Tr AI, mild MR, severe LAE, mod to severe RAE, mod to severe RVE, mod reduced RVSF, mod to severe TR, PASP 46  . Coronary artery disease   . GERD (gastroesophageal reflux disease)   . Heart attack ([place])    1983  . Hyperlipidemia   . Hypertension   . Neurogenic bladder    has had bladder catheter changes at the urology office every 60 d  . Renal failure   . Warfarin anticoagulation     Objective: BP 108/62 (BP Location: Left Arm, Patient Position: Sitting, Cuff Size: Normal)   Pulse 75   Temp 98.6 F (37 C) (Oral)   Ht 6' (1.829 m)   Wt 192 lb (87.1 kg)   SpO2 96%   BMI 26.04 kg/m  General: Awake, appears stated age HEENT: MMM, EOMi Heart: RRR Lungs: CTAB, no rales, wheezes or rhonchi. No accessory muscle use  Abdomen:  Bowel sounds present, moderately distended, nontender, no masses or organomegaly Psych: Normal affect and mood  Assessment and Plan: Nausea  Chronic indwelling Foley catheter - Plan: Ambulatory referral to Urology  Belching  Gastroesophageal reflux disease, esophagitis presence not specified  Gross hematuria - Plan: Ambulatory referral to Urology  Continue omeprazole as needed for GI issue.  I recommended that he take it daily if he is having daily symptoms, as they are mildly in the morning, he should take it at night. I would like him to see a urologist.  He may need to have catheter changes in their office if he is having issues with a home health team. The patient and Gary [name] voiced understanding and agreement to the plan.  [place], DO 03/08/18  3:04 PM

## 2018-03-11 ENCOUNTER — Telehealth: Payer: Self-pay | Admitting: *Deleted

## 2018-03-11 NOTE — Telephone Encounter (Signed)
Received Physician Orders from [facility]; forwarded to provider/SLS 01/13

## 2018-03-12 ENCOUNTER — Telehealth: Payer: Self-pay | Admitting: *Deleted

## 2018-03-12 NOTE — Telephone Encounter (Signed)
Spoke with pt.  He states he has had no energy and no appetite x 1 week (ever since he saw PCP last). He states he currently has a catheter and there hasn't seemed to be any change in urinary output / flow. Reports that he has felt like this we past UTI.  Pt is requesting urine culture be collected via home health and then results called to you for review / treatment. Please advise?

## 2018-03-12 NOTE — Telephone Encounter (Signed)
His home health is requesting? Why? Are they going to manage this? I just saw this pt.

## 2018-03-12 NOTE — Telephone Encounter (Signed)
Copied from [place] [phone]. Topic: General - Other >> Mar 12, 2018 11:38 AM Judyann Munson wrote: Reason for CRM: patient is calling to request a UTI culture by [facility]. Please advise

## 2018-03-13 NOTE — Telephone Encounter (Signed)
OK 

## 2018-03-13 NOTE — Telephone Encounter (Signed)
Called [facility] [phone], asked for [name]) informed ok per PCP for urine check. They will put in the order and take care of.

## 2018-03-14 ENCOUNTER — Telehealth: Payer: Self-pay | Admitting: *Deleted

## 2018-03-14 ENCOUNTER — Encounter: Payer: Self-pay | Admitting: Family Medicine

## 2018-03-14 NOTE — Telephone Encounter (Signed)
Received Physician Orders from [facility]; forwarded to provider/SLS 01/16

## 2018-03-18 ENCOUNTER — Telehealth: Payer: Self-pay | Admitting: Family Medicine

## 2018-03-18 ENCOUNTER — Other Ambulatory Visit: Payer: Self-pay | Admitting: Family Medicine

## 2018-03-18 DIAGNOSIS — R110 Nausea: Secondary | ICD-10-CM

## 2018-03-18 MED ORDER — URIBEL 118 MG PO CAPS: ORAL_CAPSULE | ORAL | 0 refills | Status: DC

## 2018-03-18 MED ORDER — OMEPRAZOLE 20 MG PO CPDR: DELAYED_RELEASE_CAPSULE | ORAL | 5 refills | Status: DC

## 2018-03-18 MED ORDER — ESCITALOPRAM OXALATE 20 MG PO TABS: ORAL_TABLET | ORAL | 5 refills | Status: DC

## 2018-03-18 NOTE — Telephone Encounter (Signed)
Gary [name].

## 2018-03-18 NOTE — Telephone Encounter (Signed)
Copied from [place] [phone]. Topic: Quick Communication - Rx Refill/Question >> Mar 18, 2018  3:20 PM Blase Mess A wrote: Medication: escitalopram (LEXAPRO) 20 MG tablet [[id number]] , omeprazole (PRILOSEC) 20 MG capsule [[id number]]   Has the patient contacted their pharmacy? Yes  (Agent: If no, request that the patient contact the pharmacy for the refill.) (Agent: If yes, when and what did the pharmacy advise?)  Preferred Pharmacy (with phone number or street name): [facility], [place] [phone] (Phone) [phone] (Fax)    Agent: Please be advised that RX refills may take up to 3 business days. We ask that you follow-up with your pharmacy.

## 2018-03-18 NOTE — Telephone Encounter (Signed)
Sent in

## 2018-03-18 NOTE — Addendum Note (Signed)
Addended by: Sharon Seller B on: 03/18/2018 04:36 PM   Modules accepted: Orders

## 2018-03-18 NOTE — Telephone Encounter (Signed)
Copied from [place] [id number]. Topic: Quick Communication - See Telephone Encounter >> Mar 18, 2018  3:25 PM Blase Mess A wrote: CRM for notification. See Telephone encounter for: 03/18/18.  Patient's son is calling regarding URELLE (URELLE/URISED) 118 MG tablet 118mg    [[id number]] the Patient is taking 2 per day. Instead of one per day. Dosage is 118mg  not 81mg   Requesting a new script. Please advise.  [place], [place] [phone] (Phone) [phone] (Fax)

## 2018-03-18 NOTE — Telephone Encounter (Signed)
Ok to fill 

## 2018-03-19 ENCOUNTER — Telehealth: Payer: Self-pay | Admitting: *Deleted

## 2018-03-19 NOTE — Telephone Encounter (Signed)
Pt called stating that he is checking on labs from [facility] done on Friday 03/15/2018. Pt states that Cipro does not work for him and that the RX sent the last time he had a UTI (he doesn't remember name, there were #20 pills) made him very nauseous. Please call to advise on results and if antibiotics will be sent in.

## 2018-03-19 NOTE — Telephone Encounter (Signed)
Received Lab Report results from [facility]; forwarded to provider/SLS 01/21

## 2018-03-20 ENCOUNTER — Telehealth: Payer: Self-pay | Admitting: Family Medicine

## 2018-03-20 MED ORDER — FOSFOMYCIN TROMETHAMINE 3 G PO PACK: 3.0000 g | PACK | Freq: Once | ORAL | 0 refills | Status: AC

## 2018-03-20 MED ORDER — CEPHALEXIN 500 MG PO CAPS: 500.0000 mg | ORAL_CAPSULE | Freq: Two times a day (BID) | ORAL | 0 refills | Status: DC

## 2018-03-20 NOTE — Telephone Encounter (Signed)
Copied from [place] [id number]. Topic: General - Other >> Mar 19, 2018  5:42 PM Yvette Rack wrote: Reason for CRM: Pt stated he can not come in for labs. Pt stated that he normally has his labs drawn at home by [facility]. Pt requests that the lab order be sent to [facility] so he can have labs drawn at home. Cb# [phone]

## 2018-03-20 NOTE — Telephone Encounter (Signed)
Spoke to the patient and he needs an order for his urine to be checked with [facility] Called [facility] (Patti--[phone]) to inform and she stated the test was done last week. Results received at our office 03/19/2018 in PCP's folder.

## 2018-03-20 NOTE — Telephone Encounter (Signed)
OK 

## 2018-03-20 NOTE — Telephone Encounter (Signed)
Pt appears to have an infection. I want to see pt in person next time. This took quite a while to get back to me and I don't think it's safe to do because treatment could be delayed. I called in an antibiotic in the meantime. TY.

## 2018-03-20 NOTE — Addendum Note (Signed)
Addended by: Ames Coupe on: 03/20/2018 03:51 PM   Modules accepted: Orders

## 2018-03-20 NOTE — Telephone Encounter (Signed)
patient

## 2018-03-20 NOTE — Telephone Encounter (Signed)
I have informed the patient and he thinks this antibiotic has made him sick in the past. He does well on the monurol

## 2018-03-20 NOTE — Telephone Encounter (Signed)
Called the patient informed PCP agreed to have [facility] to draw his labs. To let them know to send over the order and he will sign off on.

## 2018-03-20 NOTE — Addendum Note (Signed)
Addended by: Ames Coupe on: 03/20/2018 03:18 PM   Modules accepted: Orders

## 2018-03-21 ENCOUNTER — Telehealth: Payer: Self-pay | Admitting: Family Medicine

## 2018-03-21 NOTE — Telephone Encounter (Signed)
Copied from [place] [phone]. Topic: Quick Communication - See Telephone Encounter >> Mar 21, 2018 12:59 PM Vernona Rieger wrote: CRM for notification. See Telephone encounter for: 03/21/18.  Dorian Pod, RN with [facility] called to let Dr Nani Ravens know that they did a re-cert today on him.   She will be faxing that to the office.

## 2018-03-21 NOTE — Telephone Encounter (Signed)
FYI

## 2018-03-22 ENCOUNTER — Other Ambulatory Visit (HOSPITAL_COMMUNITY): Payer: Medicare Other

## 2018-03-25 ENCOUNTER — Telehealth (HOSPITAL_COMMUNITY): Payer: Self-pay | Admitting: Cardiology

## 2018-03-25 MED ORDER — EPLERENONE 25 MG PO TABS: 12.5000 mg | ORAL_TABLET | Freq: Every day | ORAL | 3 refills | Status: DC

## 2018-03-25 NOTE — Telephone Encounter (Signed)
Abnormal lab results received. Labs drawn 03/21/18 Cr 1.82 BUN 21 K 5.3 Na 136    Per Darrick Grinder, NP Decrease inspra to 12.5 mg, daily Repeat labs in one week    Pt aware and voiced understanding, order for med change and repeat labs sent to [facility]

## 2018-03-26 ENCOUNTER — Telehealth: Payer: Self-pay | Admitting: *Deleted

## 2018-03-26 NOTE — Telephone Encounter (Signed)
Received Lab Report results from [facility]; forwarded to provider/SLS 01/28

## 2018-03-26 NOTE — Telephone Encounter (Signed)
Received Home Health Certification and Plan of Care; forwarded to provider/SLS 01/28   

## 2018-03-27 ENCOUNTER — Telehealth: Payer: Self-pay | Admitting: Family Medicine

## 2018-03-27 NOTE — Telephone Encounter (Signed)
Copied from [place] [phone]. Topic: General - Other >> Mar 27, 2018  8:40 AM Lennox Solders wrote: Reason for CRM: pt saw dr Nani Ravens on 03/08/2018 and on 03/20/2018 dr wendling prescribed cephalexin 500 mg for uti. Pt is still having back pain and nausea and would like another refill send to [address] in [place]. Pt has an appt with urologist next month. Pt is homebound

## 2018-03-27 NOTE — Telephone Encounter (Signed)
Called the patient informed of PCP instructions. The patient verbalized understanding.

## 2018-03-27 NOTE — Telephone Encounter (Signed)
He needs appt or needs to see his urologist sooner. TY.

## 2018-03-28 ENCOUNTER — Other Ambulatory Visit (HOSPITAL_COMMUNITY): Payer: Self-pay | Admitting: Cardiology

## 2018-04-02 ENCOUNTER — Telehealth: Payer: Self-pay | Admitting: Behavioral Health

## 2018-04-02 NOTE — Telephone Encounter (Signed)
Patient would like to transfer his care to Dr. Zigmund Daniel here at [facility]. Please advise. Thanks.

## 2018-04-03 NOTE — Telephone Encounter (Signed)
Ok with me 

## 2018-04-03 NOTE — Telephone Encounter (Signed)
OK w me.  

## 2018-04-10 ENCOUNTER — Telehealth: Payer: Self-pay | Admitting: *Deleted

## 2018-04-10 NOTE — Telephone Encounter (Signed)
Received Physician Orders from [facility]; forwarded to provider/SLS 02/11

## 2018-04-11 ENCOUNTER — Encounter: Payer: Self-pay | Admitting: Family Medicine

## 2018-04-11 ENCOUNTER — Ambulatory Visit: Payer: Medicare Other | Admitting: Family Medicine

## 2018-04-11 VITALS — BP 110/58 | HR 85 | Temp 98.5°F | Ht 72.0 in | Wt 192.0 lb

## 2018-04-11 DIAGNOSIS — R32 Unspecified urinary incontinence: Secondary | ICD-10-CM

## 2018-04-11 DIAGNOSIS — Z978 Presence of other specified devices: Secondary | ICD-10-CM

## 2018-04-11 DIAGNOSIS — Z960 Presence of urogenital implants: Secondary | ICD-10-CM | POA: Diagnosis not present

## 2018-04-11 LAB — POCT URINALYSIS DIPSTICK
Bilirubin, UA: NEGATIVE
Glucose, UA: NEGATIVE
Ketones, UA: NEGATIVE
NITRITE UA: POSITIVE
PROTEIN UA: POSITIVE — AB
SPEC GRAV UA: 1.01 (ref 1.010–1.025)
Urobilinogen, UA: 0.2 E.U./dL
pH, UA: 8 (ref 5.0–8.0)

## 2018-04-11 MED ORDER — HYDROCODONE-ACETAMINOPHEN 5-325 MG PO TABS: 0.5000 | ORAL_TABLET | Freq: Every day | ORAL | 0 refills | Status: DC | PRN

## 2018-04-11 NOTE — Progress Notes (Signed)
Gary RAFANAN Sr. - 85 y.o. male MRN [id number]  Date of birth: [birth year]  Subjective Chief Complaint  Patient presents with  . Insomnia    ongoing for more than a month  . Urinary Incontinence    has been taking Uribel-not helping    HPI Gary [name]. is a 85 y.o. male with history of A. Fib, CAD, history of CHF, CKD and neurogenic bladder here today to establish care with new PCP.  He would like to discuss ongoing incontinence as well as pain control with catheter changes.   He has had neurogenic bladder for several years, followed by urology and has f/u in about 2 weeks.  He is currently taking uribel for bladder spasm/incontinence.  He doesn't feel like this is working as well as it has previously.  He has had some issues with recurrent UTI but hasn't really had pain, fever or chills at this time.  He also would like to discuss pain control with catheter changes.  Reports that catheter changes are very painful.  Changes catheter q60-70 days.  Has used tramadol in the past but reports having very strange and disturbing dreams while taking this so he "threw them out".  Hydrocodone on allergy list as causing headache but he doesn't recall this medication bothering him too much.     ROS:  A comprehensive ROS was completed and negative except as noted per HPI  Allergies  Allergen Reactions  . Amoxicillin     REACTION: ha/nausea  . Diltiazem Hcl     REACTION: low bp  . Hydrocodone-Acetaminophen     REACTION: ha  . Niacin     REACTION: ha/nausea  . Niacin-Lovastatin Er     REACTION: ha/nausea  . Paroxetine     REACTION: ha/disoriented  . Propoxyphene N-Acetaminophen     REACTION: headache  . Pseudoephedrine     REACTION: stopped urine flow  . Spironolactone Itching  . Sulfonamide Derivatives     REACTION: ha/nausea  . Tolterodine Tartrate     REACTION: bladder stopped    Past Medical History:  Diagnosis Date  . Anemia   . Anxiety   . Arrhythmia    afib  . Chronic  systolic heart failure (HCC)    a. prior EF 20-30%;  b. Echo 7/09: EF 35-40%, basal inferoposterior HK, mild AI, mild to moderate MR, moderate to marked LAE, mild to moderate TR, mildly increased PASP, moderate RAE;   c. Echo 01/2012:   mild LVH, EF 50%, Tr AI, mild MR, severe LAE, mod to severe RAE, mod to severe RVE, mod reduced RVSF, mod to severe TR, PASP 46  . Coronary artery disease   . GERD (gastroesophageal reflux disease)   . Heart attack ([place])    1983  . Hyperlipidemia   . Hypertension   . Neurogenic bladder    has had bladder catheter changes at the urology office every 60 d  . Renal failure   . Warfarin anticoagulation     Past Surgical History:  Procedure Laterality Date  . APPENDECTOMY    . CHOLECYSTECTOMY     1971  . CORONARY ARTERY BYPASS GRAFT     1995    Social History   Socioeconomic History  . Marital status: Married    Spouse name: Not on file  . Number of children: Not on file  . Years of education: Not on file  . Highest education level: Not on file  Occupational History  . Occupation: Retired  Employer: RETIRED    Comment: 1994  Social Needs  . Financial resource strain: Not on file  . Food insecurity:    Worry: Not on file    Inability: Not on file  . Transportation needs:    Medical: Not on file    Non-medical: Not on file  Tobacco Use  . Smoking status: Former Research scientist (life sciences)  . Smokeless tobacco: Never Used  Substance and Sexual Activity  . Alcohol use: No  . Drug use: No  . Sexual activity: Not on file  Lifestyle  . Physical activity:    Days per week: Not on file    Minutes per session: Not on file  . Stress: Not on file  Relationships  . Social connections:    Talks on phone: Not on file    Gets together: Not on file    Attends religious service: Not on file    Active member of club or organization: Not on file    Attends meetings of clubs or organizations: Not on file    Relationship status: Not on file  Other Topics Concern  .  Not on file  Social History Narrative  . Not on file    Family History  Problem Relation Age of Onset  . Cancer Mother   . Heart attack Father        deceased at 32, smoking, ETOH abuse  . Breast cancer Sister     Health Maintenance  Topic Date Due  . Samul Dada  03/16/1948  . COLONOSCOPY  06/08/2008  . INFLUENZA VACCINE  Completed  . PNA vac Low Risk Adult  Completed    ----------------------------------------------------------------------------------------------------------------------------------------------------------------------------------------------------------------- Physical Exam BP (!) 110/58   Pulse 85   Temp 98.5 F (36.9 C) (Oral)   Ht 6' (1.829 m)   Wt 192 lb (87.1 kg)   SpO2 96%   BMI 26.04 kg/m   Physical Exam Constitutional:      Appearance: Normal appearance.  HENT:     Head: Normocephalic and atraumatic.     Mouth/Throat:     Mouth: Mucous membranes are moist.  Eyes:     General: No scleral icterus. Neck:     Musculoskeletal: Neck supple.  Cardiovascular:     Rate and Rhythm: Normal rate. Rhythm irregular.  Pulmonary:     Effort: Pulmonary effort is normal.     Breath sounds: Normal breath sounds.  Skin:    General: Skin is warm and dry.     Findings: No rash.  Neurological:     General: No focal deficit present.     Mental Status: He is alert.  Psychiatric:        Mood and Affect: Mood normal.        Behavior: Behavior normal.     ------------------------------------------------------------------------------------------------------------------------------------------------------------------------------------------------------------------- Assessment and Plan  URINARY INCONTINENCE -Check UA -Continue uribel and keep appt with urology.   Chronic indwelling Foley catheter -Catheter changes are painful, will prescribe norco 2.5-5mg  45 min prior to catheter change.

## 2018-04-11 NOTE — Assessment & Plan Note (Signed)
-  Check UA -Continue uribel and keep appt with urology.

## 2018-04-11 NOTE — Patient Instructions (Signed)
-  Great to meet you! -We'll call you with urine results -Keep appt with urology

## 2018-04-11 NOTE — Assessment & Plan Note (Signed)
-  Catheter changes are painful, will prescribe norco 2.5-5mg  45 min prior to catheter change.

## 2018-04-13 LAB — URINE CULTURE
MICRO NUMBER:: 191709
SPECIMEN QUALITY:: ADEQUATE

## 2018-04-15 ENCOUNTER — Telehealth: Payer: Self-pay | Admitting: Family Medicine

## 2018-04-15 ENCOUNTER — Other Ambulatory Visit: Payer: Self-pay | Admitting: Family Medicine

## 2018-04-15 ENCOUNTER — Encounter: Payer: Self-pay | Admitting: Family Medicine

## 2018-04-15 MED ORDER — CEPHALEXIN 500 MG PO CAPS: 500.0000 mg | ORAL_CAPSULE | Freq: Two times a day (BID) | ORAL | 0 refills | Status: AC

## 2018-04-15 NOTE — Telephone Encounter (Signed)
Patient notified, verbalized understanding

## 2018-04-15 NOTE — Telephone Encounter (Signed)
Lets have him take cephalexin in place of monurol at this time.

## 2018-04-15 NOTE — Telephone Encounter (Signed)
Pt wants to know should he continue taking the Monurol. Please call patient.

## 2018-04-23 ENCOUNTER — Other Ambulatory Visit (HOSPITAL_COMMUNITY): Payer: Self-pay

## 2018-04-29 ENCOUNTER — Telehealth (HOSPITAL_COMMUNITY): Payer: Self-pay

## 2018-04-29 NOTE — Telephone Encounter (Signed)
Order console reports faxed to [facility] with DM sig, confirmation received

## 2018-05-10 ENCOUNTER — Other Ambulatory Visit: Payer: Self-pay | Admitting: Family Medicine

## 2018-05-10 MED ORDER — ONDANSETRON HCL 8 MG PO TABS: ORAL_TABLET | ORAL | 0 refills | Status: DC

## 2018-05-13 ENCOUNTER — Telehealth: Payer: Self-pay | Admitting: Family Medicine

## 2018-05-13 DIAGNOSIS — R32 Unspecified urinary incontinence: Secondary | ICD-10-CM

## 2018-05-13 NOTE — Telephone Encounter (Signed)
Copied from [place] [phone]. Topic: Quick Communication - Rx Refill/Question >> May 13, 2018  1:50 PM Rayann Heman wrote: Medication:nystatin-triamcinolone ointment (MYCOLOG) clotrimazole (LOTRIMIN) 1 % cream   Has the patient contacted their pharmacy?  Preferred Pharmacy (with phone number or street name):[facility], [place] [phone] (Phone) [phone] (Fax)   Agent: Please be advised that RX refills may take up to 3 business days. We ask that you follow-up with your pharmacy.

## 2018-05-14 NOTE — Telephone Encounter (Signed)
Rx request from [facility] for Rx Nystatin-Triamcinolone 1% Cream. Please advise

## 2018-05-15 ENCOUNTER — Other Ambulatory Visit: Payer: Self-pay | Admitting: Family Medicine

## 2018-06-04 NOTE — Telephone Encounter (Signed)
Pt is calling about this refill, never was sent to pharm.  Pt needs the large tube 60 gram

## 2018-06-05 MED ORDER — NYSTATIN-TRIAMCINOLONE 100000-0.1 UNIT/GM-% EX CREA: TOPICAL_CREAM | Freq: Two times a day (BID) | CUTANEOUS | Status: DC

## 2018-06-05 NOTE — Telephone Encounter (Signed)
Rx sent. Task completed.

## 2018-06-05 NOTE — Telephone Encounter (Signed)
Ok to send

## 2018-06-05 NOTE — Telephone Encounter (Signed)
Per Dr. Zigmund Daniel , [facility] to send Nystatin cream 60g

## 2018-06-13 ENCOUNTER — Other Ambulatory Visit: Payer: Self-pay | Admitting: Family Medicine

## 2018-06-13 DIAGNOSIS — R21 Rash and other nonspecific skin eruption: Secondary | ICD-10-CM

## 2018-06-13 NOTE — Telephone Encounter (Signed)
Please advise on refill.

## 2018-06-14 NOTE — Telephone Encounter (Signed)
Pt requested Rx refills on discontinued medication. Sent to Dr. Zigmund Daniel for review.

## 2018-06-18 ENCOUNTER — Other Ambulatory Visit: Payer: Self-pay

## 2018-06-18 ENCOUNTER — Telehealth: Payer: Self-pay | Admitting: Family Medicine

## 2018-06-18 DIAGNOSIS — Z960 Presence of urogenital implants: Principal | ICD-10-CM

## 2018-06-18 DIAGNOSIS — N390 Urinary tract infection, site not specified: Secondary | ICD-10-CM

## 2018-06-18 DIAGNOSIS — Z978 Presence of other specified devices: Secondary | ICD-10-CM

## 2018-06-18 MED ORDER — CLOTRIMAZOLE-BETAMETHASONE 1-0.05 % EX CREA: TOPICAL_CREAM | Freq: Two times a day (BID) | CUTANEOUS | 0 refills | Status: DC

## 2018-06-18 MED ORDER — BACITRACIN-NEOMYCIN-POLYMYXIN 400-5-5000 EX OINT: 1.0000 "application " | TOPICAL_OINTMENT | Freq: Every day | CUTANEOUS | 0 refills | Status: AC | PRN

## 2018-06-18 NOTE — Telephone Encounter (Signed)
Correction made. Order was placed in error as "in-house " Rx. That order has been cancelled . New order has been sent to pharmacy. Task completed.

## 2018-06-18 NOTE — Progress Notes (Signed)
Pt states that he didn't receive Rx nystatin-triamcinolone . It was place as in-house order, error . Will correct .  Resent to pharmacy

## 2018-06-18 NOTE — Telephone Encounter (Signed)
Pharmacy called and stated that they still have not gotten nystatin ointment. Per chart it was sent as clinic administered and not as a medication. Please resend to [facility]

## 2018-06-21 ENCOUNTER — Other Ambulatory Visit (HOSPITAL_COMMUNITY): Payer: Self-pay

## 2018-06-21 MED ORDER — EPLERENONE 25 MG PO TABS: 12.5000 mg | ORAL_TABLET | Freq: Every day | ORAL | 3 refills | Status: DC

## 2018-06-25 ENCOUNTER — Other Ambulatory Visit: Payer: Self-pay | Admitting: Family Medicine

## 2018-06-25 DIAGNOSIS — R21 Rash and other nonspecific skin eruption: Secondary | ICD-10-CM

## 2018-06-27 ENCOUNTER — Telehealth: Payer: Self-pay | Admitting: Family Medicine

## 2018-06-27 NOTE — Telephone Encounter (Signed)
Already sent in

## 2018-06-27 NOTE — Telephone Encounter (Unsigned)
Copied from [place] [phone]. Topic: Quick Communication - Rx Refill/Question >> Jun 27, 2018 11:31 AM Celene Kras A wrote: Medication: nystatin-triamcinolone (MYCOLOG II) cream   Has the patient contacted their pharmacy? Yes.  Pharmacy called and said they still have not received request for this cream please advise. (Agent: If no, request that the patient contact the pharmacy for the refill.) (Agent: If yes, when and what did the pharmacy advise?)  Preferred Pharmacy (with phone number or street name): [facility] [place], [place] [address] Phone: [phone] Fax: [phone] Not a 24 hour pharmacy; exact hours not known.    Agent: Please be advised that RX refills may take up to 3 business days. We ask that you follow-up with your pharmacy.

## 2018-07-03 NOTE — Telephone Encounter (Signed)
Patient called in stating he spoke with [facility] and they are stating they have not received the medication refills. Patient is asking if they can be sent one more time. Patient's call back is [phone].

## 2018-07-04 NOTE — Telephone Encounter (Signed)
Called Pharmacy , Spoke to [place], called in Rx cream . Epic interface issue . Rx was placed as "in-house" administration. Read Rx , 60 g, R5, Called Pt to notify , He is aware. Task completed.

## 2018-07-08 ENCOUNTER — Telehealth: Payer: Self-pay | Admitting: Family Medicine

## 2018-07-08 NOTE — Telephone Encounter (Signed)
Called [facility], Spoke Theatre stage manager , gave verbal orders for UA w/culture. She repeated back orders. Task completed.

## 2018-07-08 NOTE — Telephone Encounter (Signed)
Copied from [place] [id number]. Topic: Quick Communication - See Telephone Encounter >> Jul 08, 2018  8:50 AM Sheran Luz wrote: CRM for notification. See Telephone encounter for: 07/08/18.   Patient calling to request orders for RN with [facility], as he thinks he may have UTI. Patient denies any symptoms other than irritability, which he states happened the last time he had UTI.   [facility] # [phone]

## 2018-07-08 NOTE — Telephone Encounter (Signed)
Ok to give orders to check UA and send for culture.

## 2018-07-11 ENCOUNTER — Telehealth: Payer: Self-pay | Admitting: Family Medicine

## 2018-07-11 NOTE — Telephone Encounter (Signed)
Copied from [place]. Topic: Quick Communication - Home Health Verbal Orders >> Jul 11, 2018  9:12 AM Pauline Good wrote: Caller/Agency: Estill Bamberg Ked[facility]  Callback Number: [phone] Requesting OT/PT/Skilled Nursing/Social Work/Speech Therapy: Skilled Nursing Frequency: For starter care

## 2018-07-11 NOTE — Telephone Encounter (Signed)
Returned call back to [employer] LAM to return call back.

## 2018-07-11 NOTE — Telephone Encounter (Signed)
Ok to give VO? 

## 2018-07-12 NOTE — Telephone Encounter (Signed)
Called Alla Feeling w/ [facility]. Gave VO Approval. Task completed.

## 2018-07-16 ENCOUNTER — Telehealth: Payer: Self-pay

## 2018-07-16 NOTE — Telephone Encounter (Signed)
Copied from [place] [phone]. Topic: Quick Communication - Home Health Verbal Orders >> Jul 15, 2018  3:50 PM Virl Axe D wrote: Caller/Agency: Dorian Pod / [facility] Callback Number: [phone] / Secure VM Requesting OT/PT/Skilled Nursing/Social Work/Speech Therapy: Order for PT evaluation Frequency:  Pt is starting to have falls

## 2018-07-16 NOTE — Telephone Encounter (Signed)
Called Dorian Pod @ [facility]. Repeated VO approved by Dr. Zigmund Daniel. Task completed.

## 2018-07-16 NOTE — Telephone Encounter (Signed)
Ok for VO for PT

## 2018-07-31 ENCOUNTER — Encounter: Payer: Self-pay | Admitting: Family Medicine

## 2018-07-31 ENCOUNTER — Ambulatory Visit (INDEPENDENT_AMBULATORY_CARE_PROVIDER_SITE_OTHER): Payer: Medicare Other | Admitting: Family Medicine

## 2018-07-31 VITALS — BP 110/62 | Temp 97.0°F | Wt 194.0 lb

## 2018-07-31 DIAGNOSIS — J4520 Mild intermittent asthma, uncomplicated: Secondary | ICD-10-CM

## 2018-07-31 DIAGNOSIS — R110 Nausea: Secondary | ICD-10-CM

## 2018-07-31 HISTORY — DX: Mild intermittent asthma, uncomplicated: J45.20

## 2018-07-31 MED ORDER — ALBUTEROL SULFATE HFA 108 (90 BASE) MCG/ACT IN AERS: 2.0000 | INHALATION_SPRAY | Freq: Four times a day (QID) | RESPIRATORY_TRACT | 3 refills | Status: DC | PRN

## 2018-07-31 NOTE — Assessment & Plan Note (Signed)
-  Awaiting UA and culture results, has chronic indwelling foley. -PPI just increased as well. -Suggested trying probiotic.

## 2018-07-31 NOTE — Progress Notes (Signed)
Gary SCHANK Sr. - 85 y.o. male MRN [id number]  Date of birth: January 06, 1930   This visit type was conducted due to national recommendations for restrictions regarding the COVID-19 Pandemic (e.g. social distancing).  This format is felt to be most appropriate for this patient at this time.  All issues noted in this document were discussed and addressed.  No physical exam was performed (except for noted visual exam findings with Video Visits).  I discussed the limitations of evaluation and management by telemedicine and the availability of in person appointments. The patient expressed understanding and agreed to proceed.  I connected with@ on 07/31/18 at  2:30 PM EDT by a video enabled telemedicine application and verified that I am speaking with the correct person using two identifiers.   Patient Location: Home [address] [place] [address]   Provider location:   [facility]  Chief Complaint  Patient presents with  . Follow-up    possibel UTI, Rx refills    HPI  Gary [name]. is a 85 y.o. male who presents via audio/video conferencing for a telehealth visit today.  He is being seen today for possible UTI and requests refill of inhaler.  He reports using albuterol inhaler in the morning.  Has some increase cough and wheezing when first waking up.  He denies side effects including palpitations or tachycardia.  He does have history of CHF but denies any changes to weight, shortness of breath, or increased edema.  He gets good relief from albuterol.    He is also concerned about some mild nausea and decreased appetite.  Reports that he also has had some increased bloating and gas.  Has felt similar when he has had a UTI previously.  UA and Culture have already been sent, however results are pending.  He denies fever, chills, changes to bowels.  Has daily BM that is soft.      ROS:  A comprehensive ROS was completed and negative except as noted per HPI  Past Medical  History:  Diagnosis Date  . Anemia   . Anxiety   . Arrhythmia    afib  . Chronic systolic heart failure (HCC)    a. prior EF 20-30%;  b. Echo 7/09: EF 35-40%, basal inferoposterior HK, mild AI, mild to moderate MR, moderate to marked LAE, mild to moderate TR, mildly increased PASP, moderate RAE;   c. Echo 01/2012:   mild LVH, EF 50%, Tr AI, mild MR, severe LAE, mod to severe RAE, mod to severe RVE, mod reduced RVSF, mod to severe TR, PASP 46  . Coronary artery disease   . GERD (gastroesophageal reflux disease)   . Heart attack ([place])    1983  . Hyperlipidemia   . Hypertension   . Neurogenic bladder    has had bladder catheter changes at the urology office every 60 d  . Renal failure   . Warfarin anticoagulation     Past Surgical History:  Procedure Laterality Date  . APPENDECTOMY    . CHOLECYSTECTOMY     1971  . CORONARY ARTERY BYPASS GRAFT     1995    Family History  Problem Relation Age of Onset  . Cancer Mother   . Heart attack Father        deceased at 14, smoking, ETOH abuse  . Breast cancer Sister     Social History   Socioeconomic History  . Marital status: Married    Spouse name: Not on file  .  Number of children: Not on file  . Years of education: Not on file  . Highest education level: Not on file  Occupational History  . Occupation: Retired    Fish farm manager: RETIRED    Comment: [employer]  . Financial resource strain: Not on file  . Food insecurity:    Worry: Not on file    Inability: Not on file  . Transportation needs:    Medical: Not on file    Non-medical: Not on file  Tobacco Use  . Smoking status: Former Research scientist (life sciences)  . Smokeless tobacco: Never Used  Substance and Sexual Activity  . Alcohol use: No  . Drug use: No  . Sexual activity: Not on file  Lifestyle  . Physical activity:    Days per week: Not on file    Minutes per session: Not on file  . Stress: Not on file  Relationships  . Social connections:    Talks on phone: Not on file     Gets together: Not on file    Attends religious service: Not on file    Active member of club or organization: Not on file    Attends meetings of clubs or organizations: Not on file    Relationship status: Not on file  . Intimate partner violence:    Fear of current or ex partner: Not on file    Emotionally abused: Not on file    Physically abused: Not on file    Forced sexual activity: Not on file  Other Topics Concern  . Not on file  Social History Narrative  . Not on file     Current Outpatient Medications:  .  apixaban (ELIQUIS) 2.5 MG TABS tablet, Take 1 tablet (2.5 mg total) by mouth 2 (two) times daily., Disp: 180 tablet, Rfl: 1 .  atorvastatin (LIPITOR) 20 MG tablet, Take 1 tablet (20 mg total) by mouth daily., Disp: 90 tablet, Rfl: 1 .  carvedilol (COREG) 12.5 MG tablet, Take 1 tablet (12.5 mg total) by mouth 2 (two) times daily with a meal., Disp: 180 tablet, Rfl: 3 .  clotrimazole-betamethasone (LOTRISONE) cream, APPLY TOPICALLY TWICE DAILY, Disp: 30 g, Rfl: 0 .  eplerenone (INSPRA) 25 MG tablet, Take 0.5 tablets (12.5 mg total) by mouth daily., Disp: 45 tablet, Rfl: 3 .  escitalopram (LEXAPRO) 20 MG tablet, TAKE 1 TABLET(20 MG) BY MOUTH DAILY, Disp: 30 tablet, Rfl: 5 .  ferrous sulfate 325 (65 FE) MG tablet, Take 325 mg by mouth daily with breakfast. , Disp: , Rfl:  .  fluticasone (CUTIVATE) 0.05 % cream, , Disp: , Rfl:  .  folic acid (FOLVITE) 1 MG tablet, Take 1 mg by mouth daily.  , Disp: , Rfl:  .  Glucos-Chondroit-Hyaluron-MSM (GLUCOSAMINE CHONDROITIN JOINT PO), Take 1 tablet by mouth 2 (two) times daily., Disp: , Rfl:  .  HYDROcodone-acetaminophen (NORCO) 5-325 MG tablet, Take 0.5-1 tablets by mouth daily as needed for moderate pain (Take 45 minutes prior to catheter change.)., Disp: 15 tablet, Rfl: 0 .  hydrOXYzine (ATARAX/VISTARIL) 25 MG tablet, TK 1 T PO QD PRN, Disp: , Rfl: 0 .  lidocaine (XYLOCAINE) 2 % jelly, Place 1 application into the urethra daily as  needed (pain)., Disp: 30 mL, Rfl: 12 .  Meth-Hyo-M Bl-Na Phos-Ph Sal (URO-MP) 118 MG CAPS, TAKE 1-4 CAPSULES BY MOUTH EVERY DAY AS DIRECTED, Disp: 360 capsule, Rfl: 10 .  Multiple Vitamin (MULTIVITAMIN) capsule, Take 1 capsule by mouth daily.  , Disp: , Rfl:  .  neomycin-bacitracin-polymyxin (NEOSPORIN) ointment, Apply 1 application topically daily as needed for wound care., Disp: 15 g, Rfl: 0 .  nystatin-triamcinolone (MYCOLOG II) cream, , Disp: , Rfl:  .  omeprazole (PRILOSEC) 20 MG capsule, TAKE 1 CAPSULE(20 MG) BY MOUTH TWICE DAILY BEFORE A MEAL (Patient taking differently: Take 20 mg by mouth 2 (two) times daily. TAKE 1 CAPSULE(20 MG) BY MOUTH TWICE DAILY BEFORE A MEAL), Disp: 60 capsule, Rfl: 5 .  ondansetron (ZOFRAN) 8 MG tablet, TAKE 1 TABLET BY MOUTH EVERY 8 HOURS AS NEEDED FOR NAUSEA OR VOMITING, Disp: 90 tablet, Rfl: 10 .  Saw [place] 450 MG CAPS, Take by mouth QID., Disp: , Rfl:  .  torsemide (DEMADEX) 20 MG tablet, Take 40mg  every morning and 80mg  every evening., Disp: 180 tablet, Rfl: 3 .  albuterol (VENTOLIN HFA) 108 (90 Base) MCG/ACT inhaler, Inhale 2 puffs into the lungs every 6 (six) hours as needed for wheezing or shortness of breath., Disp: 1 Inhaler, Rfl: 3  EXAM:  VITALS per patient if applicable: BP 606/30 Comment: reported by [facility] nurse  Temp (!) 97 F (36.1 C) (Oral)   Wt 194 lb (88 kg) Comment: pt reports from hm wt  BMI 26.31 kg/m   GENERAL: alert, oriented, appears well and in no acute distress  HEENT: atraumatic, conjunttiva clear, no obvious abnormalities on inspection of external nose and ears  NECK: normal movements of the head and neck  LUNGS: on inspection no signs of respiratory distress, breathing rate appears normal, no obvious gross SOB, gasping or wheezing  CV: no obvious cyanosis  MS: moves all visible extremities without noticeable abnormality  PSYCH/NEURO: pleasant and cooperative, no obvious depression or anxiety, speech and thought  processing grossly intact  ASSESSMENT AND PLAN:  Discussed the following assessment and plan:  Nausea -Awaiting UA and culture results, has chronic indwelling foley. -PPI just increased as well. -Suggested trying probiotic.   RAD (reactive airway disease) with wheezing, mild intermittent, uncomplicated -Doing well with albuterol, renewed.  -No signs or other symptoms suggestive of heart failure exacerbation.  Discussed signs to look out for.        I discussed the assessment and treatment plan with the patient. The patient was provided an opportunity to ask questions and all were answered. The patient agreed with the plan and demonstrated an understanding of the instructions.   The patient was advised to call back or seek an in-person evaluation if the symptoms worsen or if the condition fails to improve as anticipated.     Luetta Nutting, DO

## 2018-07-31 NOTE — Assessment & Plan Note (Signed)
-  Doing well with albuterol, renewed.  -No signs or other symptoms suggestive of heart failure exacerbation.  Discussed signs to look out for.

## 2018-08-01 ENCOUNTER — Telehealth (HOSPITAL_COMMUNITY): Payer: Self-pay

## 2018-08-01 DIAGNOSIS — I5042 Chronic combined systolic (congestive) and diastolic (congestive) heart failure: Secondary | ICD-10-CM

## 2018-08-01 NOTE — Telephone Encounter (Signed)
Would increase torsemide to 80 mg bid x 3 days then decrease back to prior dose.  Cut back on sodium in diet.  Will need BMET next week. He is overdue for an office visit, please arrange followup soon.

## 2018-08-01 NOTE — Telephone Encounter (Signed)
Advised son Gary [name] of plan to increase Torsemide 80mg  BID x 3 days then will decrease to previous dose. Advised to also cut salt in diet.  Gary [name] states father does not adhere to diet but will try to encourge him.  BMET ordered and verbal given to [facility] at [facility].  Labs will be done next week.  Gary [name] they will get a call to schedule a f/u appt.  Verbalized understanding of all.

## 2018-08-01 NOTE — Telephone Encounter (Signed)
Pt son [name] noted that patient has swelling in ankles and wheezing that has been progressing over the course of couple weeks.  He has bloating, loss of appetite and nausea.  Son thinks he is taking all of his meds. Pt lives with other son that prepares his pill box.  Scott notes that patient reports his weight is steady but does not know how accurate that is as patient can hold back information at times.  Please advise.  Pertinent Meds   Torsemide -40qa/80qp Carvedilol 12.5 BID Eplerenone 12.5 daily  Routed to Dr Aundra Dubin

## 2018-08-02 ENCOUNTER — Telehealth: Payer: Self-pay | Admitting: Family Medicine

## 2018-08-02 NOTE — Telephone Encounter (Signed)
Copied from [place] [id number]. Topic: Quick Communication - See Telephone Encounter >> Aug 02, 2018  2:31 PM Margot Ables wrote: CRM for notification. See Telephone encounter for: 08/02/18.  Calling to see if urine culture results have been received from UA with [facility]. Please call back.

## 2018-08-05 NOTE — Telephone Encounter (Signed)
Called Son , Jaxyn "Gardena' . Stated what Dr. Zigmund Daniel is requesting for Pt to come to lab visit for UA. Son stated that Pt has an appt at his Urologist , will ask and let our office know(via MyChart or call) .

## 2018-08-05 NOTE — Telephone Encounter (Signed)
Called son, Dorrien Grunder. , to inform him test was not completed due to [facility] requirements of two different containers that weren't provided at the time of the urine collection. He will call [facility] to resume services after Pt procedure . Then urine sample will be collected in the appropriate containers. Task Completed.

## 2018-08-05 NOTE — Telephone Encounter (Signed)
Next week?  Any way he can come in to our office to leave urine?

## 2018-08-05 NOTE — Telephone Encounter (Signed)
Called Burnard Hawthorne RN  @ [facility] . [facility] Bamberg stated that [facility] requires two different testing containers, which wasn't provided at the time of collection of urine sample. Test results from [facility] had "test not preformed" . Burnard Hawthorne RN , stated that when Pt resumes services , will recollect sample in appropriate containers provided by [facility].

## 2018-08-06 ENCOUNTER — Ambulatory Visit (HOSPITAL_COMMUNITY)
Admission: RE | Admit: 2018-08-06 | Discharge: 2018-08-06 | Disposition: A | Discharge status: Home / Self Care | Payer: Medicare Other | Source: Ambulatory Visit | Attending: Cardiology | Admitting: Cardiology

## 2018-08-06 ENCOUNTER — Other Ambulatory Visit: Payer: Self-pay

## 2018-08-06 ENCOUNTER — Encounter (HOSPITAL_COMMUNITY): Payer: Self-pay

## 2018-08-06 DIAGNOSIS — I5042 Chronic combined systolic (congestive) and diastolic (congestive) heart failure: Secondary | ICD-10-CM | POA: Diagnosis not present

## 2018-08-06 MED ORDER — TORSEMIDE 20 MG PO TABS: 80.0000 mg | ORAL_TABLET | Freq: Two times a day (BID) | ORAL | 3 refills | Status: DC

## 2018-08-06 NOTE — Patient Instructions (Addendum)
1. Keep torsemide at 80 mg twice a day  2. Nurse will come to home for labs and reds vest measurement on June 10th, 2020.  You will receive a call for a time frame.   3. Your physician recommends that you schedule a follow-up appointment in: 2 weeks with Dr Aundra Dubin in office.

## 2018-08-06 NOTE — Progress Notes (Signed)
Heart Failure TeleHealth Note  Due to national recommendations of social distancing due to [place] 19, Audio/video telehealth visit is felt to be most appropriate for this [name] at this time.  See MyChart message from today for [name] consent regarding telehealth for [facility].  Date:  08/06/2018   ID:  Gary Lope Sr., DOB Jan 21, 1930, MRN [id number]  Location: Home  Provider location: [place] [facility] Type of Visit: Established [name]   PCP:  Gary Nutting, DO  Cardiologist:  Dr. Aundra Dubin  Chief Complaint: Shortness of breath   History of Present Illness: Gary COIA Sr. is a 85 y.o. male who presents via audio/video conferencing for a telehealth visit today.     he denies symptoms worrisome for COVID 19.   [name] has a history of CAD s/p CABG in 1995, chronic atrial fibrillation, CKD, and chronic diastolic CHF with prominent RV failure.  [name] has struggled with right-sided failure. He was admitted in 5/16 with abdominal distention, volume overload, and elevated creatinine as high as 6.  Echo showed normal LV EF but RV was moderately dilated with moderate-severe TR, ?Ebsteins anomaly anatomy.  He had a paracentesis for ascites.  V/Q scan was negative for PE. He refused RHC while in [facility] hospital.  He was diuresed and creatinine improved.  Repeat echo was done 6/17.  This showed EF down to 20-25% with regional wall motion abnormalities, moderately dilated RV, only mild TR with PASP 44 mmHg.  Lexiscan Cardiolite in 8/17 showed EF 39% with fixed defects, no ischemia.  Echo in 3/19 with EF 30-35% and prominent RV dysfunction.   He has been noted recently to develop increased lower extremity edema and dyspnea.  He was told to increase torsemide to 80 mg bid several days ago.  He has done so, and weight is down about 4 lbs.  He still has significant lower extremity edema.  Appetite is poor.  He feels congestion.  Has not been very active, does ok walking  around his house.  He has had orthopnea recently.  He gets heartburn pain occasionally when lying down in bed at night. No exertional chest pain.   Labs (5/16): K 3.6, creatinine 6.15 => 2.47, LFTs normal, LDL 23, HCT 25.2 Labs (07/28/14): K 4.6, Creatinine 2.62, BNP 463 Labs (6/16): creatinine 2.23 => 2.01, K 4.4, BNP 227 Labs (8/16): LFTs normal Labs (12/16): K 4, creatinine 2.11, HCT 40.8 Labs (6/17): K 4.5, creatinine 1.6, LDL 37, HDL 30 Labs (8/17): K 4.2, creatinine 1.77, BNP 176 Labs (1/18): K 4.4, creatinine 1.75 Labs (9/18): K 5, creatinine 1.7 Labs (12/18): LDL 41, HDL 36 Labs (2/19): K 4.7, creatinine 2.72, hgb 11.5 Labs (3/19): K 4.6, creatinine 1.9 Labs (5/19): K 4.2, creatinine 1.89  Labs (10/19): K 4.7, creatinine 1.85, LDL 34  PMH:  1. CAD: MI 1983, CABG 1995.   - Lexiscan Cardiolite (8/17) with EF 39%, fixed inferior/inferoseptal defect and fixed apical septal/apical defect.  2. Chronic systolic failure with prominent RV failure: ?Ischemic cardiomyopathy.  - Echo (5/16) with EF 55-60%, moderate LV hypertrophy, moderately dilated RV, massive biatrial enlargement, moderate-severe TR with some question of Ebstein's anomaly, PA systolic pressure 37 mmHg.  [name] has had ascites likely related to RV failure with h/o paracentesis.  - Echo (6/17) with EF 20-25%, regional wall motion abnormalities, mild MR, moderately dilated RV with normal systolic function, mild TR (improved from prior), PASP 44 mmHg.  - Unable to tolerate spironolactone.  - Echo (3/19)  with EF 30-35% with D-shaped septum and moderately dilated/moderately dysfunctional RV, moderate-severe TR, mild to moderate MR.  PASP 41 mmHg, dilated IVC.  3. CKD stage 3 4. Permanent atrial fibrillation 5. LBBB 6. Hyperlipidemia 7. HTN 8. Complex pancreatic cyst: Followed by GI.  9. Neurogenic bladder: Chronic foley.  10. Anemia of renal disease.  11. Cholecystectomy 12. Appendectomy 13. Low back pain.   Current  Outpatient Medications  Medication Sig Dispense Refill  . albuterol (VENTOLIN HFA) 108 (90 Base) MCG/ACT inhaler Inhale 2 puffs into Gary lungs every 6 (six) hours as needed for wheezing or shortness of breath. 1 Inhaler 3  . apixaban (ELIQUIS) 2.5 MG TABS tablet Take 1 tablet (2.5 mg total) by mouth 2 (two) times daily. 180 tablet 1  . atorvastatin (LIPITOR) 20 MG tablet Take 1 tablet (20 mg total) by mouth daily. 90 tablet 1  . carvedilol (COREG) 12.5 MG tablet Take 1 tablet (12.5 mg total) by mouth 2 (two) times daily with a meal. 180 tablet 3  . clotrimazole-betamethasone (LOTRISONE) cream APPLY TOPICALLY TWICE DAILY 30 g 0  . eplerenone (INSPRA) 25 MG tablet Take 0.5 tablets (12.5 mg total) by mouth daily. 45 tablet 3  . escitalopram (LEXAPRO) 20 MG tablet TAKE 1 TABLET(20 MG) BY MOUTH DAILY 30 tablet 5  . ferrous sulfate 325 (65 FE) MG tablet Take 325 mg by mouth daily with breakfast.     . fluticasone (CUTIVATE) 0.05 % cream     . folic acid (FOLVITE) 1 MG tablet Take 1 mg by mouth daily.      . Glucos-Chondroit-Hyaluron-MSM (GLUCOSAMINE CHONDROITIN JOINT PO) Take 1 tablet by mouth 2 (two) times daily.    Marland Kitchen HYDROcodone-acetaminophen (NORCO) 5-325 MG tablet Take 0.5-1 tablets by mouth daily as needed for moderate pain (Take 45 minutes prior to catheter change.). 15 tablet 0  . hydrOXYzine (ATARAX/VISTARIL) 25 MG tablet TK 1 T PO QD PRN  0  . lidocaine (XYLOCAINE) 2 % jelly Place 1 application into Gary urethra daily as needed (pain). 30 mL 12  . Meth-Hyo-M Bl-Na Phos-Ph Sal (URO-MP) 118 MG CAPS TAKE 1-4 CAPSULES BY MOUTH EVERY DAY AS DIRECTED 360 capsule 10  . Multiple Vitamin (MULTIVITAMIN) capsule Take 1 capsule by mouth daily.      Marland Kitchen neomycin-bacitracin-polymyxin (NEOSPORIN) ointment Apply 1 application topically daily as needed for wound care. 15 g 0  . nystatin-triamcinolone (MYCOLOG II) cream     . omeprazole (PRILOSEC) 20 MG capsule TAKE 1 CAPSULE(20 MG) BY MOUTH TWICE DAILY BEFORE A  MEAL ([name] taking differently: Take 20 mg by mouth 2 (two) times daily. TAKE 1 CAPSULE(20 MG) BY MOUTH TWICE DAILY BEFORE A MEAL) 60 capsule 5  . ondansetron (ZOFRAN) 8 MG tablet TAKE 1 TABLET BY MOUTH EVERY 8 HOURS AS NEEDED FOR NAUSEA OR VOMITING 90 tablet 10  . Saw Palmetto 450 MG CAPS Take by mouth QID.    Marland Kitchen torsemide (DEMADEX) 20 MG tablet Take 4 tablets (80 mg total) by mouth 2 (two) times daily. 360 tablet 3   No current facility-administered medications for this encounter.     Allergies:   Amoxicillin; Diltiazem hcl; Hydrocodone-acetaminophen; Niacin; Niacin-lovastatin er; Paroxetine; Propoxyphene n-acetaminophen; Pseudoephedrine; Spironolactone; Sulfonamide derivatives; and Tolterodine tartrate   Social History:  Gary [name]  reports that he has quit smoking. He has never used smokeless tobacco. He reports that he does not drink alcohol or use drugs.   Family History:  Gary [name]'s family history includes Breast cancer in his sister;  Cancer in his mother; Heart attack in his father.   ROS:  Please see Gary history of present illness.   All other systems are personally reviewed and negative.   Exam:  (Video/Tele Health Call; Exam is subjective and or/visual.) BP 160/60 per [name] (son thinks this is not right).  Neck: JVP difficult to assess.  General:  Speaks in full sentences. No resp difficulty. Lungs: Normal respiratory effort with conversation.  Abdomen: Non-distended per [name] report Extremities:1-2+ edema right lower leg > left lower leg Neuro: Alert & oriented x 3.   Recent Labs: 12/21/2017: BUN 34; Creatinine, Ser 1.85; Hemoglobin 13.2; Platelets 186; Potassium 4.7; Sodium 133  Personally reviewed   Wt Readings from Last 3 Encounters:  07/31/18 88 kg (194 lb)  04/11/18 87.1 kg (192 lb)  03/08/18 87.1 kg (192 lb)      ASSESSMENT AND PLAN:  1. Atrial fibrillation: Permanent.  Dilated atria on echo, unlikely to stay in NSR if DCCV were to be attempted. He is  on apixaban 2.5 bid.  HR is controlled.   2. Chronic systolic CHF with prominent RV failure:  In Gary past, echo had shown RV dysfunction but normal LV systolic function.  Echo in 6/17, however, showed EF 20-25% with wall motion abnormalities and moderately dilated RV.  Concern for ischemic cardiomyopathy with worsening of coronary disease, but he has had no event consistent with MI (chest, arm or neck pain) and his symptoms have been very stable.  Lexiscan Cardiolite in 8/17 showed only fixed defects, no ischemia.  No evidence that he has tachycardia-mediated cardiomyopathy (rate has been controlled).  Echo in 3/19 showed EF 30-35%, D-shaped interventricular septum, dysfunctional RV, and moderate-severe TR.  More volume overloaded recently with NYHA class III symptoms including orthopnea.  Torsemide just increased to 80 mg bid and weight has started to come down.  - I will send a nurse to his house to get a REDS vest measurement.  - Keep torsemide at 80 mg bid.  Will get BMET.  - Continue Coreg 12.5 mg bid. - Continue eplerenone (did not tolerate spironolactone).  - Off lisinopril with rise in creatinine in Gary past to 2.7.  3. Tricuspid regurgitation: Moderate to severe TR on most recent echo with RV failure. 4. Ascites: Suspect this has been due hepatic congestion from RV failure.  Now resolved.  5. CKD: Stage 3.  HD would be difficult with RV failure.   - BMET today.  6. CAD: s/p CABG.  No ischemic-type chest pain.  Continue statin.  No ASA with use of warfarin and stable CAD.  Lexiscan Cardiolite in 8/17 showed areas of infarction with no ischemia, given CKD would hold off on cardiac cath in absence of ischemia.  Good lipids in 10/19.   7. HTN: BP high today, son is not sure this was an accurate reading.  He will check BP daily and bring a log to our next office visit.    COVID screen Gary [name] does not have any symptoms that suggest any further testing/ screening at this time.  Social  distancing reinforced today.  [name] Risk: After full review of this patients clinical status, I feel that they are at moderate risk for cardiac decompensation at this time.  Relevant cardiac medications were reviewed at length with Gary [name] today. Gary [name] does not have concerns regarding their medications at this time.   Recommended follow-up:  2 wks in office  Today, I have spent 21 minutes with Gary [name] with  telehealth technology discussing Gary above issues .    Signed, [name], MD  08/06/2018  [place] [address] [facility] [place] [address] [phone] (office) [phone] (fax)

## 2018-08-06 NOTE — Progress Notes (Addendum)
Called and spoke with [name] (pts son). AVS discussed, questions answered. AVS sent via my chart. Verbalized understanding. Spoke with Vanita Ingles RN, she will go to patients home tomorrow or thursday for lab work and [employer].

## 2018-08-07 ENCOUNTER — Ambulatory Visit (HOSPITAL_COMMUNITY)
Admission: RE | Admit: 2018-08-07 | Discharge: 2018-08-07 | Disposition: A | Discharge status: Home / Self Care | Payer: Medicare Other | Source: Ambulatory Visit | Attending: Cardiology | Admitting: Cardiology

## 2018-08-07 ENCOUNTER — Encounter: Payer: Self-pay | Admitting: Family Medicine

## 2018-08-07 ENCOUNTER — Other Ambulatory Visit: Payer: Self-pay

## 2018-08-07 DIAGNOSIS — I5042 Chronic combined systolic (congestive) and diastolic (congestive) heart failure: Secondary | ICD-10-CM

## 2018-08-07 LAB — BASIC METABOLIC PANEL
Anion gap: 7 (ref 5–15)
BUN: 48 mg/dL — ABNORMAL HIGH (ref 8–23)
CO2: 36 mmol/L — ABNORMAL HIGH (ref 22–32)
Calcium: 9.6 mg/dL (ref 8.9–10.3)
Chloride: 94 mmol/L — ABNORMAL LOW (ref 98–111)
Creatinine, Ser: 1.69 mg/dL — ABNORMAL HIGH (ref 0.61–1.24)
GFR calc Af Amer: 41 mL/min — ABNORMAL LOW (ref >60)
GFR calc non Af Amer: 36 mL/min — ABNORMAL LOW (ref >60)
Glucose, Bld: 147 mg/dL — ABNORMAL HIGH (ref 70–99)
Potassium: 4.4 mmol/L (ref 3.5–5.1)
Sodium: 137 mmol/L (ref 135–145)

## 2018-08-07 LAB — CBC
HCT: 40.7 % (ref 39.0–52.0)
Hemoglobin: 12 g/dL — ABNORMAL LOW (ref 13.0–17.0)
MCH: 25.9 pg — ABNORMAL LOW (ref 26.0–34.0)
MCHC: 29.5 g/dL — ABNORMAL LOW (ref 30.0–36.0)
MCV: 87.9 fL (ref 80.0–100.0)
Platelets: 148 10*3/uL — ABNORMAL LOW (ref 150–400)
RBC: 4.63 MIL/uL (ref 4.22–5.81)
RDW: 15.1 % (ref 11.5–15.5)
WBC: 4.1 10*3/uL (ref 4.0–10.5)
nRBC: 0 % (ref 0.0–0.2)

## 2018-08-07 NOTE — Progress Notes (Signed)
[name]      DOB: 08/14/1929  Purpose of Visit: ReDS readin, CBC, BMET   HF provider:  Aundra Dubin  Medications: Is the patient taking all medications listed on MAR from Epic? Yes  List any medications that are not being taken correctly: none    List any medication refills needed: n/a  Is the patient able to pick up medications? No but daughter-in-law does.  Vitals: BP:  98/60  (close to what his BP has been with home checks)   HR: 74   Oxygen:  95% RA Weight:    194lb        Physical Exam:  Lung sounds: clear  Heart sounds: regular  Peripheral edema: yes.  RLE>LLE, but states they're better than they had been   Wounds: no  Location:  Any patient concerns? States he feels good overall, "not 100%, but on the way" No complaints other than needing to use his rescue inhaler for last 2 weeks due to "throat congestion/wheezing" which "took care of it" but hasn't needed it in the last 4-5 days.   He had his chronic, 20 yr catheter changed yesterday at [facility] and said it went better than it usually goes.  Normally he had bleeding from urethra x at least a day and none noted today.  Normally it's changed Q 60-90 days). States he's been wearing his compression stockings regularly, having his CNA that comes 3x/wk assist him in taking on/off.  States he normally is on torsemide 40mg  AM & 80mg  PM, but yesterday was put back on 80mg  BID by [facility]  ReDS Vest/Clip Reading: 27%  Rhythm Strip: n/a  Is Home Health recommended? No, he has a CNA 3x/week and currently has PT coming to the house.   If yes, state reason:   Vanita Ingles, RN 08/07/18

## 2018-08-12 ENCOUNTER — Other Ambulatory Visit: Payer: Self-pay | Admitting: Family Medicine

## 2018-08-12 ENCOUNTER — Telehealth: Payer: Self-pay | Admitting: Family Medicine

## 2018-08-12 DIAGNOSIS — R21 Rash and other nonspecific skin eruption: Secondary | ICD-10-CM

## 2018-08-12 DIAGNOSIS — R110 Nausea: Secondary | ICD-10-CM

## 2018-08-16 ENCOUNTER — Telehealth: Payer: Self-pay | Admitting: Family Medicine

## 2018-08-16 NOTE — Telephone Encounter (Signed)
Tried to call Gary [name] @ [facility]. LAM

## 2018-08-16 NOTE — Telephone Encounter (Signed)
Ok for VO to extend services

## 2018-08-16 NOTE — Telephone Encounter (Signed)
Almira PT with [facility] called stating she would like to extend patients PT, due to patient has missed appts. For : 1week 1  2week 2 1week 1  Almira call back # [phone]

## 2018-08-19 ENCOUNTER — Other Ambulatory Visit: Payer: Self-pay | Admitting: Family Medicine

## 2018-08-19 ENCOUNTER — Encounter: Payer: Self-pay | Admitting: Family Medicine

## 2018-08-19 MED ORDER — CIPROFLOXACIN HCL 500 MG PO TABS: 500.0000 mg | ORAL_TABLET | Freq: Two times a day (BID) | ORAL | 0 refills | Status: DC

## 2018-08-19 NOTE — Progress Notes (Signed)
Cipro sent to pharmacy for treatment of UTI.

## 2018-08-19 NOTE — Telephone Encounter (Signed)
Faxed orders over . Closing task

## 2018-08-19 NOTE — Telephone Encounter (Signed)
[place], [place] [phone] (Phone) [phone] (Fax)   Called to inquire about refill request made on 08/12/2018. Please advise

## 2018-08-20 ENCOUNTER — Encounter (HOSPITAL_COMMUNITY): Payer: Self-pay | Admitting: Cardiology

## 2018-08-20 ENCOUNTER — Other Ambulatory Visit: Payer: Self-pay

## 2018-08-20 ENCOUNTER — Ambulatory Visit (HOSPITAL_COMMUNITY)
Admission: RE | Admit: 2018-08-20 | Discharge: 2018-08-20 | Disposition: A | Discharge status: Home / Self Care | Payer: Medicare Other | Source: Ambulatory Visit | Attending: Cardiology | Admitting: Cardiology

## 2018-08-20 VITALS — BP 136/82 | HR 68 | Wt 192.4 lb

## 2018-08-20 DIAGNOSIS — Z882 Allergy status to sulfonamides status: Secondary | ICD-10-CM | POA: Insufficient documentation

## 2018-08-20 DIAGNOSIS — Z87891 Personal history of nicotine dependence: Secondary | ICD-10-CM | POA: Diagnosis not present

## 2018-08-20 DIAGNOSIS — I447 Left bundle-branch block, unspecified: Secondary | ICD-10-CM | POA: Insufficient documentation

## 2018-08-20 DIAGNOSIS — Z9049 Acquired absence of other specified parts of digestive tract: Secondary | ICD-10-CM | POA: Diagnosis not present

## 2018-08-20 DIAGNOSIS — Z951 Presence of aortocoronary bypass graft: Secondary | ICD-10-CM | POA: Diagnosis not present

## 2018-08-20 DIAGNOSIS — I5022 Chronic systolic (congestive) heart failure: Secondary | ICD-10-CM | POA: Diagnosis not present

## 2018-08-20 DIAGNOSIS — Z7951 Long term (current) use of inhaled steroids: Secondary | ICD-10-CM | POA: Insufficient documentation

## 2018-08-20 DIAGNOSIS — Z885 Allergy status to narcotic agent status: Secondary | ICD-10-CM | POA: Diagnosis not present

## 2018-08-20 DIAGNOSIS — Z803 Family history of malignant neoplasm of breast: Secondary | ICD-10-CM | POA: Diagnosis not present

## 2018-08-20 DIAGNOSIS — Z888 Allergy status to other drugs, medicaments and biological substances status: Secondary | ICD-10-CM | POA: Insufficient documentation

## 2018-08-20 DIAGNOSIS — N183 Chronic kidney disease, stage 3 unspecified: Secondary | ICD-10-CM

## 2018-08-20 DIAGNOSIS — I130 Hypertensive heart and chronic kidney disease with heart failure and stage 1 through stage 4 chronic kidney disease, or unspecified chronic kidney disease: Secondary | ICD-10-CM | POA: Insufficient documentation

## 2018-08-20 DIAGNOSIS — E785 Hyperlipidemia, unspecified: Secondary | ICD-10-CM | POA: Diagnosis not present

## 2018-08-20 DIAGNOSIS — Z809 Family history of malignant neoplasm, unspecified: Secondary | ICD-10-CM | POA: Diagnosis not present

## 2018-08-20 DIAGNOSIS — Z7901 Long term (current) use of anticoagulants: Secondary | ICD-10-CM | POA: Diagnosis not present

## 2018-08-20 DIAGNOSIS — D631 Anemia in chronic kidney disease: Secondary | ICD-10-CM | POA: Diagnosis not present

## 2018-08-20 DIAGNOSIS — I5042 Chronic combined systolic (congestive) and diastolic (congestive) heart failure: Secondary | ICD-10-CM | POA: Diagnosis not present

## 2018-08-20 DIAGNOSIS — Z880 Allergy status to penicillin: Secondary | ICD-10-CM | POA: Diagnosis not present

## 2018-08-20 DIAGNOSIS — R188 Other ascites: Secondary | ICD-10-CM | POA: Insufficient documentation

## 2018-08-20 DIAGNOSIS — I4821 Permanent atrial fibrillation: Secondary | ICD-10-CM | POA: Diagnosis not present

## 2018-08-20 DIAGNOSIS — I25810 Atherosclerosis of coronary artery bypass graft(s) without angina pectoris: Secondary | ICD-10-CM | POA: Insufficient documentation

## 2018-08-20 DIAGNOSIS — I252 Old myocardial infarction: Secondary | ICD-10-CM | POA: Diagnosis not present

## 2018-08-20 DIAGNOSIS — Z79899 Other long term (current) drug therapy: Secondary | ICD-10-CM | POA: Insufficient documentation

## 2018-08-20 LAB — BASIC METABOLIC PANEL
Anion gap: 7 (ref 5–15)
BUN: 51 mg/dL — ABNORMAL HIGH (ref 8–23)
CO2: 35 mmol/L — ABNORMAL HIGH (ref 22–32)
Calcium: 9.8 mg/dL (ref 8.9–10.3)
Chloride: 96 mmol/L — ABNORMAL LOW (ref 98–111)
Creatinine, Ser: 2.27 mg/dL — ABNORMAL HIGH (ref 0.61–1.24)
GFR calc Af Amer: 29 mL/min — ABNORMAL LOW (ref >60)
GFR calc non Af Amer: 25 mL/min — ABNORMAL LOW (ref >60)
Glucose, Bld: 126 mg/dL — ABNORMAL HIGH (ref 70–99)
Potassium: 4.2 mmol/L (ref 3.5–5.1)
Sodium: 138 mmol/L (ref 135–145)

## 2018-08-20 MED ORDER — EPLERENONE 25 MG PO TABS: 25.0000 mg | ORAL_TABLET | Freq: Every day | ORAL | 3 refills | Status: AC

## 2018-08-20 MED ORDER — POTASSIUM CHLORIDE CRYS ER 20 MEQ PO TBCR: 20.0000 meq | EXTENDED_RELEASE_TABLET | ORAL | 0 refills | Status: AC

## 2018-08-20 MED ORDER — METOLAZONE 2.5 MG PO TABS: 2.5000 mg | ORAL_TABLET | ORAL | 0 refills | Status: DC

## 2018-08-20 NOTE — Patient Instructions (Addendum)
Labs were done today. We will call you with any ABNORMAL results. No news is good news!  INCREASE Eplerenone to 25 mg (1 tab) daily.  BEGIN taking Metolazone 2.5 (1 tab) once a week on Wednesday mornings.  BEGIN taking Potassium 20 mEq (1 tab) once a week when taking Metolazone.   Please have your home health nurse draw a BMET in 2 weeks.   Your physician has requested that you have an echocardiogram. Echocardiography is a painless test that uses sound waves to create images of your heart. It provides your doctor with information about the size and shape of your heart and how well your heart's chambers and valves are working. This procedure takes approximately one hour. There are no restrictions for this procedure.  Your physician recommends that you schedule a follow-up appointment in: 1 month with Dr Aundra Dubin.  At the [facility], you and your health needs are our priority. As part of our continuing mission to provide you with exceptional heart care, we have created designated Provider Care Teams. These Care Teams include your primary Cardiologist (physician) and Advanced Practice Providers (APPs- Physician Assistants and Nurse Practitioners) who all work together to provide you with the care you need, when you need it.   You may see any of the following providers on your designated Care Team at your next follow up: Marland Kitchen Dr Glori Bickers . Dr [name] . Darrick Grinder, NP

## 2018-08-20 NOTE — Progress Notes (Signed)
Date:  08/20/2018   ID:  Gary Lope Sr., DOB [birth year], MRN [id number]  Location: Home  Provider location: [facility] Type of Visit: Established patient   PCP:  Luetta Nutting, DO  Cardiologist:  Dr. Aundra Dubin  History of Present Illness: Gary SESLER Sr. is a 85 y.o. male with a history of CAD s/p CABG in 1995, chronic atrial fibrillation, CKD, and chronic systolic CHF with prominent RV failure.  Patient has struggled with right-sided failure. He was admitted in 5/16 with abdominal distention, volume overload, and elevated creatinine as high as 6.  Echo showed normal LV EF but RV was moderately dilated with moderate-severe TR, ?Ebsteins anomaly anatomy.  He had a paracentesis for ascites.  V/Q scan was negative for PE. He refused RHC while in the hospital.  He was diuresed and creatinine improved.  Repeat echo was done 6/17.  This showed EF down to 20-25% with regional wall motion abnormalities, moderately dilated RV, only mild TR with PASP 44 mmHg.  Lexiscan Cardiolite in 8/17 showed EF 39% with fixed defects, no ischemia.  Echo in 3/19 with EF 30-35% and prominent RV dysfunction.   He returns for followup of CHF.  Torsemide has been increased to 80 mg bid with volume overload.  He has had a poor appetite and lethargy recently, UTI was diagnosed and he has started antibiotics.  Weight is down 6 lbs.  No dyspnea walking around his house.  Still does his woodworking. Doing PT.  Chronic orthopnea and sleeps in recliner.  More fatigued during the day and has to take a nap.  SBP controlled in 100s-120s on home readings.   Labs (5/16): K 3.6, creatinine 6.15 => 2.47, LFTs normal, LDL 23, HCT 25.2 Labs (07/28/14): K 4.6, Creatinine 2.62, BNP 463 Labs (6/16): creatinine 2.23 => 2.01, K 4.4, BNP 227 Labs (8/16): LFTs normal Labs (12/16): K 4, creatinine 2.11, HCT 40.8 Labs (6/17): K 4.5, creatinine 1.6, LDL 37, HDL 30 Labs (8/17): K 4.2, creatinine 1.77, BNP 176 Labs  (1/18): K 4.4, creatinine 1.75 Labs (9/18): K 5, creatinine 1.7 Labs (12/18): LDL 41, HDL 36 Labs (2/19): K 4.7, creatinine 2.72, hgb 11.5 Labs (3/19): K 4.6, creatinine 1.9 Labs (5/19): K 4.2, creatinine 1.89  Labs (10/19): K 4.7, creatinine 1.85, LDL 34 Labs (6/10): K 4.4, creatinine 1.69, hgb 12  PMH:  1. CAD: MI 1983, CABG 1995.   - Lexiscan Cardiolite (8/17) with EF 39%, fixed inferior/inferoseptal defect and fixed apical septal/apical defect.  2. Chronic systolic failure with prominent RV failure: ?Ischemic cardiomyopathy.  - Echo (5/16) with EF 55-60%, moderate LV hypertrophy, moderately dilated RV, massive biatrial enlargement, moderate-severe TR with some question of Ebstein's anomaly, PA systolic pressure 37 mmHg.  Patient has had ascites likely related to RV failure with h/o paracentesis.  - Echo (6/17) with EF 20-25%, regional wall motion abnormalities, mild MR, moderately dilated RV with normal systolic function, mild TR (improved from prior), PASP 44 mmHg.  - Unable to tolerate spironolactone.  - Echo (3/19) with EF 30-35% with D-shaped septum and moderately dilated/moderately dysfunctional RV, moderate-severe TR, mild to moderate MR.  PASP 41 mmHg, dilated IVC.  3. CKD stage 3 4. Permanent atrial fibrillation 5. LBBB 6. Hyperlipidemia 7. HTN 8. Complex pancreatic cyst: Followed by GI.  9. Neurogenic bladder: Chronic foley.  10. Anemia of renal disease.  11. Cholecystectomy 12. Appendectomy 13. Low back pain.   Current Outpatient Medications  Medication Sig Dispense Refill  .  albuterol (VENTOLIN HFA) 108 (90 Base) MCG/ACT inhaler Inhale 2 puffs into the lungs every 6 (six) hours as needed for wheezing or shortness of breath. 1 Inhaler 3  . apixaban (ELIQUIS) 2.5 MG TABS tablet Take 1 tablet (2.5 mg total) by mouth 2 (two) times daily. 180 tablet 1  . atorvastatin (LIPITOR) 20 MG tablet Take 1 tablet (20 mg total) by mouth daily. 90 tablet 1  . carvedilol (COREG) 12.5  MG tablet Take 1 tablet (12.5 mg total) by mouth 2 (two) times daily with a meal. 180 tablet 3  . ciprofloxacin (CIPRO) 500 MG tablet Take 1 tablet (500 mg total) by mouth 2 (two) times daily. 20 tablet 0  . clotrimazole-betamethasone (LOTRISONE) cream APPLY TOPICALLY TWICE DAILY 30 g 0  . eplerenone (INSPRA) 25 MG tablet Take 1 tablet (25 mg total) by mouth daily. 90 tablet 3  . escitalopram (LEXAPRO) 20 MG tablet TAKE 1 TABLET BY MOUTH DAILY 30 tablet 10  . ferrous sulfate 325 (65 FE) MG tablet Take 325 mg by mouth daily with breakfast.     . fluticasone (CUTIVATE) 0.05 % cream     . folic acid (FOLVITE) 1 MG tablet Take 1 mg by mouth daily.      . Glucos-Chondroit-Hyaluron-MSM (GLUCOSAMINE CHONDROITIN JOINT PO) Take 1 tablet by mouth 2 (two) times daily.    Marland Kitchen HYDROcodone-acetaminophen (NORCO) 5-325 MG tablet Take 0.5-1 tablets by mouth daily as needed for moderate pain (Take 45 minutes prior to catheter change.). 15 tablet 0  . hydrOXYzine (ATARAX/VISTARIL) 25 MG tablet TK 1 T PO QD PRN  0  . lidocaine (XYLOCAINE) 2 % jelly Place 1 application into the urethra daily as needed (pain). 30 mL 12  . Meth-Hyo-M Bl-Na Phos-Ph Sal (URO-MP) 118 MG CAPS TAKE 1-4 CAPSULES BY MOUTH EVERY DAY AS DIRECTED 360 capsule 10  . Multiple Vitamin (MULTIVITAMIN) capsule Take 1 capsule by mouth daily.      Marland Kitchen neomycin-bacitracin-polymyxin (NEOSPORIN) ointment Apply 1 application topically daily as needed for wound care. 15 g 0  . nystatin-triamcinolone (MYCOLOG II) cream     . omeprazole (PRILOSEC) 20 MG capsule TAKE 1 CAPSULE BY MOUTH TWICE DAILY BEFORE MEALS 60 capsule 10  . ondansetron (ZOFRAN) 8 MG tablet TAKE 1 TABLET BY MOUTH EVERY 8 HOURS AS NEEDED FOR NAUSEA OR VOMITING 90 tablet 10  . Saw Palmetto 450 MG CAPS Take by mouth QID.    Marland Kitchen torsemide (DEMADEX) 20 MG tablet Take 4 tablets (80 mg total) by mouth 2 (two) times daily. 360 tablet 3  . metolazone (ZAROXOLYN) 2.5 MG tablet Take 1 tablet (2.5 mg total) by  mouth once a week. ON Wednesday MORNING 52 tablet 0  . potassium chloride SA (K-DUR) 20 MEQ tablet Take 1 tablet (20 mEq total) by mouth once a week. TAKE WITH WEEKLY METOLAZONE 52 tablet 0   No current facility-administered medications for this encounter.     Allergies:   Amoxicillin, Diltiazem hcl, Hydrocodone-acetaminophen, Niacin, Niacin-lovastatin er, Paroxetine, Propoxyphene n-acetaminophen, Pseudoephedrine, Spironolactone, Sulfonamide derivatives, and Tolterodine tartrate   Social History:  The patient  reports that he has quit smoking. He has never used smokeless tobacco. He reports that he does not drink alcohol or use drugs.   Family History:  The patient's family history includes Breast cancer in his sister; Cancer in his mother; Heart attack in his father.   ROS:  Please see the history of present illness.   All other systems are personally reviewed and negative.  Exam:   BP 136/82   Pulse 68   Wt 87.3 kg (192 lb 6.4 oz)   SpO2 94%   BMI 26.09 kg/m  General: NAD Neck: JVP 10-12 cm, no thyromegaly or thyroid nodule.  Lungs: Crackles at bases bilaterally.  CV: Nondisplaced PMI.  Heart regular S1/S2, no S3/S4, 1/6 HSM LLSB.  1+ edema to knees.  No carotid bruit.  Normal pedal pulses.  Abdomen: Soft, nontender, no hepatosplenomegaly, no distention.  Skin: Intact without lesions or rashes.  Neurologic: Alert and oriented x 3.  Psych: Normal affect. Extremities: No clubbing or cyanosis.  HEENT: Normal.   Recent Labs: 08/07/2018: Hemoglobin 12.0; Platelets 148 08/20/2018: BUN 51; Creatinine, Ser 2.27; Potassium 4.2; Sodium 138  Personally reviewed   Wt Readings from Last 3 Encounters:  08/20/18 87.3 kg (192 lb 6.4 oz)  08/07/18 88 kg (194 lb)  07/31/18 88 kg (194 lb)      ASSESSMENT AND PLAN:  1. Atrial fibrillation: Permanent.  Dilated atria on echo, unlikely to stay in NSR if DCCV were to be attempted. He is on apixaban 2.5 bid.  HR is controlled.   2. Chronic  systolic CHF with prominent RV failure:  In the past, echo had shown RV dysfunction but normal LV systolic function.  Echo in 6/17, however, showed EF 20-25% with wall motion abnormalities and moderately dilated RV.  Concern for ischemic cardiomyopathy with worsening of coronary disease, but he has had no event consistent with MI (chest, arm or neck pain) and his symptoms have been very stable.  Lexiscan Cardiolite in 8/17 showed only fixed defects, no ischemia.  No evidence that he has tachycardia-mediated cardiomyopathy (rate has been controlled).  Echo in 3/19 showed EF 30-35%, D-shaped interventricular septum, dysfunctional RV, and moderate-severe TR.  More volume overloaded recently with NYHA class III symptoms including orthopnea.  Torsemide has been increased to 80 mg bid.  Weight is coming down but he still appears volume overloaded on exam.  - Keep torsemide at 80 mg bid but will add metolazone 2.5 mg once weekly on Wednesdays.  He will take KCl 20 mEq only on metolazone days.  Will get BMET today and again in 10 days.  - Continue Coreg 12.5 mg bid. - Increase eplerenone to 25 mg daily (did not tolerate spironolactone).  - Off lisinopril with rise in creatinine in the past to 2.7.  - Repeat echo at followup in 1 month.  3. Tricuspid regurgitation: Moderate to severe TR on most recent echo with RV failure. 4. Ascites: Suspect this has been due hepatic congestion from RV failure.  Now resolved.  5. CKD: Stage 3.  HD would be difficult with RV failure.   - BMET today.  6. CAD: s/p CABG.  No ischemic-type chest pain.  Continue statin.  No ASA with use of warfarin and stable CAD.  Lexiscan Cardiolite in 8/17 showed areas of infarction with no ischemia, given CKD would hold off on cardiac cath in absence of ischemia.  Good lipids in 10/19.   7. HTN: BP controlled.   Followup in 1 month with echo.    Signed, [name], MD  08/20/2018  [facility] [address]  Heart and [place] [address] [phone] (office) [phone] (fax)

## 2018-08-21 ENCOUNTER — Telehealth (HOSPITAL_COMMUNITY): Payer: Self-pay

## 2018-08-21 DIAGNOSIS — I5042 Chronic combined systolic (congestive) and diastolic (congestive) heart failure: Secondary | ICD-10-CM

## 2018-08-21 NOTE — Telephone Encounter (Signed)
-----   Message from Larey Dresser, MD sent at 08/20/2018  5:15 PM EDT ----- Creatinine up but volume overloaded.  Will not change from plan for now but needs BMET in 1 week.

## 2018-08-21 NOTE — Telephone Encounter (Signed)
[facility] orders faxed over

## 2018-08-21 NOTE — Telephone Encounter (Signed)
Spoke with patients son about lab results and need to repeat labs in 1 week. No other changes noted. Verbalized understanding. Lab appt made.

## 2018-08-21 NOTE — Telephone Encounter (Signed)
[place] , [place] . [place] [phone]. Spoke to pharmacist, reported Rx refill of  Clotrimazole-Betamethason 1-0.005 % cream was denied. Noted by pharmacist. Closing encounter.

## 2018-08-23 ENCOUNTER — Encounter (HOSPITAL_COMMUNITY): Payer: Self-pay

## 2018-08-28 ENCOUNTER — Other Ambulatory Visit: Payer: Self-pay

## 2018-08-28 ENCOUNTER — Ambulatory Visit (HOSPITAL_COMMUNITY)
Admission: RE | Admit: 2018-08-28 | Discharge: 2018-08-28 | Disposition: A | Discharge status: Home / Self Care | Payer: Medicare Other | Source: Ambulatory Visit | Attending: Cardiology | Admitting: Cardiology

## 2018-08-28 DIAGNOSIS — I5042 Chronic combined systolic (congestive) and diastolic (congestive) heart failure: Secondary | ICD-10-CM | POA: Insufficient documentation

## 2018-08-28 LAB — BASIC METABOLIC PANEL
Anion gap: 9 (ref 5–15)
BUN: 57 mg/dL — ABNORMAL HIGH (ref 8–23)
CO2: 34 mmol/L — ABNORMAL HIGH (ref 22–32)
Calcium: 9.7 mg/dL (ref 8.9–10.3)
Chloride: 95 mmol/L — ABNORMAL LOW (ref 98–111)
Creatinine, Ser: 1.97 mg/dL — ABNORMAL HIGH (ref 0.61–1.24)
GFR calc Af Amer: 34 mL/min — ABNORMAL LOW (ref >60)
GFR calc non Af Amer: 30 mL/min — ABNORMAL LOW (ref >60)
Glucose, Bld: 117 mg/dL — ABNORMAL HIGH (ref 70–99)
Potassium: 4.5 mmol/L (ref 3.5–5.1)
Sodium: 138 mmol/L (ref 135–145)

## 2018-09-03 ENCOUNTER — Other Ambulatory Visit (HOSPITAL_COMMUNITY): Payer: Medicare Other

## 2018-09-13 ENCOUNTER — Telehealth (HOSPITAL_COMMUNITY): Payer: Self-pay

## 2018-09-13 NOTE — Telephone Encounter (Signed)
Orders faxed to [facility]. Confirmation received.

## 2018-09-17 ENCOUNTER — Other Ambulatory Visit (HOSPITAL_COMMUNITY): Payer: Self-pay | Admitting: Cardiology

## 2018-09-18 ENCOUNTER — Other Ambulatory Visit (HOSPITAL_COMMUNITY): Payer: Self-pay | Admitting: Cardiology

## 2018-09-23 ENCOUNTER — Other Ambulatory Visit: Payer: Self-pay

## 2018-09-23 ENCOUNTER — Ambulatory Visit (HOSPITAL_BASED_OUTPATIENT_CLINIC_OR_DEPARTMENT_OTHER)
Admission: RE | Admit: 2018-09-23 | Discharge: 2018-09-23 | Disposition: A | Discharge status: Home / Self Care | Payer: Medicare Other | Source: Ambulatory Visit | Attending: Cardiology | Admitting: Cardiology

## 2018-09-23 ENCOUNTER — Ambulatory Visit (HOSPITAL_COMMUNITY)
Admission: RE | Admit: 2018-09-23 | Discharge: 2018-09-23 | Disposition: A | Discharge status: Home / Self Care | Payer: Medicare Other | Source: Ambulatory Visit | Attending: Cardiology | Admitting: Cardiology

## 2018-09-23 ENCOUNTER — Encounter (HOSPITAL_COMMUNITY): Payer: Self-pay | Admitting: Cardiology

## 2018-09-23 VITALS — BP 110/62 | HR 59 | Wt 177.2 lb

## 2018-09-23 DIAGNOSIS — R188 Other ascites: Secondary | ICD-10-CM | POA: Insufficient documentation

## 2018-09-23 DIAGNOSIS — I2510 Atherosclerotic heart disease of native coronary artery without angina pectoris: Secondary | ICD-10-CM | POA: Diagnosis not present

## 2018-09-23 DIAGNOSIS — E785 Hyperlipidemia, unspecified: Secondary | ICD-10-CM | POA: Insufficient documentation

## 2018-09-23 DIAGNOSIS — R0602 Shortness of breath: Secondary | ICD-10-CM | POA: Diagnosis present

## 2018-09-23 DIAGNOSIS — I5022 Chronic systolic (congestive) heart failure: Secondary | ICD-10-CM | POA: Insufficient documentation

## 2018-09-23 DIAGNOSIS — E1122 Type 2 diabetes mellitus with diabetic chronic kidney disease: Secondary | ICD-10-CM | POA: Diagnosis not present

## 2018-09-23 DIAGNOSIS — Z803 Family history of malignant neoplasm of breast: Secondary | ICD-10-CM | POA: Insufficient documentation

## 2018-09-23 DIAGNOSIS — I5042 Chronic combined systolic (congestive) and diastolic (congestive) heart failure: Secondary | ICD-10-CM

## 2018-09-23 DIAGNOSIS — Z79899 Other long term (current) drug therapy: Secondary | ICD-10-CM | POA: Insufficient documentation

## 2018-09-23 DIAGNOSIS — Z7901 Long term (current) use of anticoagulants: Secondary | ICD-10-CM | POA: Insufficient documentation

## 2018-09-23 DIAGNOSIS — N183 Chronic kidney disease, stage 3 (moderate): Secondary | ICD-10-CM | POA: Insufficient documentation

## 2018-09-23 DIAGNOSIS — I447 Left bundle-branch block, unspecified: Secondary | ICD-10-CM | POA: Insufficient documentation

## 2018-09-23 DIAGNOSIS — Z951 Presence of aortocoronary bypass graft: Secondary | ICD-10-CM | POA: Diagnosis not present

## 2018-09-23 DIAGNOSIS — Z882 Allergy status to sulfonamides status: Secondary | ICD-10-CM | POA: Diagnosis not present

## 2018-09-23 DIAGNOSIS — I071 Rheumatic tricuspid insufficiency: Secondary | ICD-10-CM | POA: Insufficient documentation

## 2018-09-23 DIAGNOSIS — I130 Hypertensive heart and chronic kidney disease with heart failure and stage 1 through stage 4 chronic kidney disease, or unspecified chronic kidney disease: Secondary | ICD-10-CM | POA: Insufficient documentation

## 2018-09-23 DIAGNOSIS — I4821 Permanent atrial fibrillation: Secondary | ICD-10-CM

## 2018-09-23 DIAGNOSIS — Z87891 Personal history of nicotine dependence: Secondary | ICD-10-CM | POA: Diagnosis not present

## 2018-09-23 DIAGNOSIS — I252 Old myocardial infarction: Secondary | ICD-10-CM | POA: Insufficient documentation

## 2018-09-23 DIAGNOSIS — Z885 Allergy status to narcotic agent status: Secondary | ICD-10-CM | POA: Diagnosis not present

## 2018-09-23 DIAGNOSIS — Z880 Allergy status to penicillin: Secondary | ICD-10-CM | POA: Insufficient documentation

## 2018-09-23 DIAGNOSIS — Z8249 Family history of ischemic heart disease and other diseases of the circulatory system: Secondary | ICD-10-CM | POA: Diagnosis not present

## 2018-09-23 DIAGNOSIS — Z888 Allergy status to other drugs, medicaments and biological substances status: Secondary | ICD-10-CM | POA: Diagnosis not present

## 2018-09-23 LAB — BASIC METABOLIC PANEL
Anion gap: 13 (ref 5–15)
BUN: 68 mg/dL — ABNORMAL HIGH (ref 8–23)
CO2: 34 mmol/L — ABNORMAL HIGH (ref 22–32)
Calcium: 10.3 mg/dL (ref 8.9–10.3)
Chloride: 91 mmol/L — ABNORMAL LOW (ref 98–111)
Creatinine, Ser: 1.87 mg/dL — ABNORMAL HIGH (ref 0.61–1.24)
GFR calc Af Amer: 37 mL/min — ABNORMAL LOW (ref >60)
GFR calc non Af Amer: 32 mL/min — ABNORMAL LOW (ref >60)
Glucose, Bld: 99 mg/dL (ref 70–99)
Potassium: 4.1 mmol/L (ref 3.5–5.1)
Sodium: 138 mmol/L (ref 135–145)

## 2018-09-23 NOTE — Patient Instructions (Signed)
No medication changes today  Labs today We will only contact you if something comes back abnormal or we need to make some changes. Otherwise no news is good news!  Your physician recommends that you schedule a follow-up appointment in: 6 weeks with Dr. Aundra Dubin  At the [facility], you and your health needs are our priority. As part of our continuing mission to provide you with exceptional heart care, we have created designated Provider Care Teams. These Care Teams include your primary Cardiologist (physician) and Advanced Practice Providers (APPs- Physician Assistants and Nurse Practitioners) who all work together to provide you with the care you need, when you need it.   You may see any of the following providers on your designated Care Team at your next follow up: Marland Kitchen Dr Glori Bickers . Dr [name] . Darrick Grinder, NP

## 2018-09-24 ENCOUNTER — Telehealth: Payer: Self-pay | Admitting: Family Medicine

## 2018-09-24 ENCOUNTER — Telehealth (HOSPITAL_COMMUNITY): Payer: Self-pay

## 2018-09-24 NOTE — Telephone Encounter (Signed)
Dr. Olen Pel

## 2018-09-24 NOTE — Telephone Encounter (Signed)
Erica from [facility] called to report the Pt's weight of 173lbs

## 2018-09-24 NOTE — Telephone Encounter (Signed)
Pts son aware of results and recommendation for repeat labs in 2 weeks. Called [facility] and gave verbal order for bmet.

## 2018-09-24 NOTE — Telephone Encounter (Signed)
-----   Message from Larey Dresser, MD sent at 09/23/2018 11:37 PM EDT ----- No changes for now but repeat BMET in 2 wks.

## 2018-09-24 NOTE — Progress Notes (Signed)
Date:  09/24/2018   ID:  Gary Lope Sr., DOB 1929-07-03, MRN [id number]   Provider location: [facility] Type of Visit: Established patient   PCP:  Gary Nutting, DO  Cardiologist:  Dr. Aundra Dubin  Chief Complaint: Shortness of breath   History of Present Illness: Gary Knightly. is a 85 y.o. male who has a history of CAD s/p CABG in 1995, chronic atrial fibrillation, CKD, and chronic diastolic CHF with prominent RV failure.  Patient has struggled with right-sided failure. He was admitted in 5/16 with abdominal distention, volume overload, and elevated creatinine as high as 6.  Echo showed normal LV EF but RV was moderately dilated with moderate-severe TR, ?Ebsteins anomaly anatomy.  He had a paracentesis for ascites.  V/Q scan was negative for PE. He refused RHC while in the hospital.  He was diuresed and creatinine improved.  Repeat echo was done 6/17.  This showed EF down to 20-25% with regional wall motion abnormalities, moderately dilated RV, only mild TR with PASP 44 mmHg.  Lexiscan Cardiolite in 8/17 showed EF 39% with fixed defects, no ischemia.  Echo in 3/19 with EF 30-35% and prominent RV dysfunction.   Echo was done today and reviewed, EF 20-25% with diffuse hypokinesis and prominent dyssynchrony, moderate RV dilation with severely decreased RV systolic function, mild-moderate MR, moderate TR, dilated IVC.   He returns today for followup of CHF and atrial fibrillation.  Worsening volume overload recently, diuretics have been increased.  Since last appointment, he has lost 15 lbs.  Breathing is improved, now with mild dyspnea after walking 100 feet.  Using walker for balance.  Still able to work in his woodshop.  He has slept in a recliner for years.  No chest pain.  No lightheadedness or falls. Edema is considerably decreased.   ECG (personally reviewed): Atrial fibrillation, rate 70, LBBB with QRS 184 msec  Labs (5/16): K 3.6, creatinine 6.15 =>  2.47, LFTs normal, LDL 23, HCT 25.2 Labs (07/28/14): K 4.6, Creatinine 2.62, BNP 463 Labs (6/16): creatinine 2.23 => 2.01, K 4.4, BNP 227 Labs (8/16): LFTs normal Labs (12/16): K 4, creatinine 2.11, HCT 40.8 Labs (6/17): K 4.5, creatinine 1.6, LDL 37, HDL 30 Labs (8/17): K 4.2, creatinine 1.77, BNP 176 Labs (1/18): K 4.4, creatinine 1.75 Labs (9/18): K 5, creatinine 1.7 Labs (12/18): LDL 41, HDL 36 Labs (2/19): K 4.7, creatinine 2.72, hgb 11.5 Labs (3/19): K 4.6, creatinine 1.9 Labs (5/19): K 4.2, creatinine 1.89  Labs (10/19): K 4.7, creatinine 1.85, LDL 34 Labs (6/20): hgb 12 Labs (7/20): K 4, creatinine 2.01  PMH:  1. CAD: MI 1983, CABG 1995.   - Lexiscan Cardiolite (8/17) with EF 39%, fixed inferior/inferoseptal defect and fixed apical septal/apical defect.  2. Chronic systolic failure with prominent RV failure: ?Ischemic cardiomyopathy.  - Echo (5/16) with EF 55-60%, moderate LV hypertrophy, moderately dilated RV, massive biatrial enlargement, moderate-severe TR with some question of Ebstein's anomaly, PA systolic pressure 37 mmHg.  Patient has had ascites likely related to RV failure with h/o paracentesis.  - Echo (6/17) with EF 20-25%, regional wall motion abnormalities, mild MR, moderately dilated RV with normal systolic function, mild TR (improved from prior), PASP 44 mmHg.  - Unable to tolerate spironolactone.  - Echo (3/19) with EF 30-35% with D-shaped septum and moderately dilated/moderately dysfunctional RV, moderate-severe TR, mild to moderate MR.  PASP 41 mmHg, dilated IVC.  - Echo (7/20): EF 20-25%, diffuse HK with  inferior AK and septal-lateral dyssynchrony, moderately dilated RV with severely decreased systolic function, mild-moderate MR, moderate TR, IVC dilated.  3. CKD stage 3 4. Permanent atrial fibrillation 5. LBBB 6. Hyperlipidemia 7. HTN 8. Complex pancreatic cyst: Followed by GI.  9. Neurogenic bladder: Chronic foley.  10. Anemia of renal disease.  11.  Cholecystectomy 12. Appendectomy 13. Low back pain.   Current Outpatient Medications  Medication Sig Dispense Refill  . albuterol (VENTOLIN HFA) 108 (90 Base) MCG/ACT inhaler Inhale 2 puffs into the lungs every 6 (six) hours as needed for wheezing or shortness of breath. 1 Inhaler 3  . apixaban (ELIQUIS) 2.5 MG TABS tablet Take 1 tablet (2.5 mg total) by mouth 2 (two) times daily. 180 tablet 1  . atorvastatin (LIPITOR) 20 MG tablet Take 1 tablet (20 mg total) by mouth daily. 90 tablet 1  . carvedilol (COREG) 12.5 MG tablet Take 1 tablet (12.5 mg total) by mouth 2 (two) times daily with a meal. 180 tablet 3  . clotrimazole-betamethasone (LOTRISONE) cream APPLY TOPICALLY TWICE DAILY 30 g 0  . eplerenone (INSPRA) 25 MG tablet Take 1 tablet (25 mg total) by mouth daily. 90 tablet 3  . escitalopram (LEXAPRO) 20 MG tablet TAKE 1 TABLET BY MOUTH DAILY 30 tablet 10  . ferrous sulfate 325 (65 FE) MG tablet Take 325 mg by mouth daily with breakfast.     . fluticasone (CUTIVATE) 0.05 % cream     . folic acid (FOLVITE) 1 MG tablet Take 1 mg by mouth daily.      . Glucos-Chondroit-Hyaluron-MSM (GLUCOSAMINE CHONDROITIN JOINT PO) Take 1 tablet by mouth 2 (two) times daily.    Marland Kitchen HYDROcodone-acetaminophen (NORCO) 5-325 MG tablet Take 0.5-1 tablets by mouth daily as needed for moderate pain (Take 45 minutes prior to catheter change.). 15 tablet 0  . hydrOXYzine (ATARAX/VISTARIL) 25 MG tablet TK 1 T PO QD PRN  0  . lidocaine (XYLOCAINE) 2 % jelly Place 1 application into the urethra daily as needed (pain). 30 mL 12  . Meth-Hyo-M Bl-Na Phos-Ph Sal (URO-MP) 118 MG CAPS TAKE 1-4 CAPSULES BY MOUTH EVERY DAY AS DIRECTED 360 capsule 10  . metolazone (ZAROXOLYN) 2.5 MG tablet Take 1 tablet (2.5 mg total) by mouth once a week. ON Wednesday MORNING 52 tablet 0  . Multiple Vitamin (MULTIVITAMIN) capsule Take 1 capsule by mouth daily.      Marland Kitchen neomycin-bacitracin-polymyxin (NEOSPORIN) ointment Apply 1 application topically  daily as needed for wound care. 15 g 0  . nystatin-triamcinolone (MYCOLOG II) cream     . omeprazole (PRILOSEC) 20 MG capsule TAKE 1 CAPSULE BY MOUTH TWICE DAILY BEFORE MEALS 60 capsule 10  . ondansetron (ZOFRAN) 8 MG tablet TAKE 1 TABLET BY MOUTH EVERY 8 HOURS AS NEEDED FOR NAUSEA OR VOMITING 90 tablet 10  . potassium chloride SA (K-DUR) 20 MEQ tablet Take 1 tablet (20 mEq total) by mouth once a week. TAKE WITH WEEKLY METOLAZONE 52 tablet 0  . Saw Palmetto 450 MG CAPS Take by mouth QID.    Marland Kitchen torsemide (DEMADEX) 20 MG tablet Take 80 mg by mouth 2 (two) times daily.     No current facility-administered medications for this encounter.     Allergies:   Amoxicillin, Diltiazem hcl, Hydrocodone-acetaminophen, Niacin, Niacin-lovastatin er, Paroxetine, Propoxyphene n-acetaminophen, Pseudoephedrine, Spironolactone, Sulfonamide derivatives, and Tolterodine tartrate   Social History:  The patient  reports that he has quit smoking. He has never used smokeless tobacco. He reports that he does not drink alcohol or  use drugs.   Family History:  The patient's family history includes Breast cancer in his sister; Cancer in his mother; Heart attack in his father.   ROS:  Please see the history of present illness.   All other systems are personally reviewed and negative.   Exam:   BP 110/62   Pulse (!) 59   Wt 80.4 kg (177 lb 3.2 oz)   SpO2 95%   BMI 24.03 kg/m  General: NAD Neck: No JVD, no thyromegaly or thyroid nodule.  Lungs: Clear to auscultation bilaterally with normal respiratory effort. CV: Nondisplaced PMI.  Heart irregular S1/S2, no S3/S4, 1/6 HSM LLSB.  Trace ankle edema.  No carotid bruit.  Normal pedal pulses.  Abdomen: Soft, nontender, no hepatosplenomegaly, no distention.  Skin: Intact without lesions or rashes.  Neurologic: Alert and oriented x 3.  Psych: Normal affect. Extremities: No clubbing or cyanosis.  HEENT: Normal.   Recent Labs: 08/07/2018: Hemoglobin 12.0; Platelets 148  09/23/2018: BUN 68; Creatinine, Ser 1.87; Potassium 4.1; Sodium 138  Personally reviewed   Wt Readings from Last 3 Encounters:  09/23/18 80.4 kg (177 lb 3.2 oz)  08/20/18 87.3 kg (192 lb 6.4 oz)  08/07/18 88 kg (194 lb)      ASSESSMENT AND PLAN:  1. Atrial fibrillation: Permanent.  Dilated atria on echo, unlikely to stay in NSR if DCCV were to be attempted. He is on apixaban 2.5 bid.  HR is controlled.   2. Chronic systolic CHF with prominent RV failure:  In the past, echo had shown RV dysfunction but normal LV systolic function.  Echo in 6/17, however, showed EF 20-25% with wall motion abnormalities and moderately dilated RV.  Concern for ischemic cardiomyopathy with worsening of coronary disease, but he has had no event consistent with MI (chest, arm or neck pain) and his symptoms have been very stable.  Lexiscan Cardiolite in 8/17 showed only fixed defects, no ischemia.  No evidence that he has tachycardia-mediated cardiomyopathy (rate has been controlled).  Echo in 3/19 showed EF 30-35%, D-shaped interventricular septum, dysfunctional RV, and moderate-severe TR.  Echo in 7/20 with EF 20-25%, dyssynchrony, moderated dilated RV with severe systolic dysfunction, moderate TR.  Diuretics increased recently with addition of weekly metolazone.  His weight is down considerably and volume status improved on exam.  He remains NYHA class III.  - Keep torsemide at 80 mg bid.  Will get BMET.  - Continue metolazone 2.5 mg once weekly with 20 mEq KCl on metolazone days.  - Continue Coreg 12.5 mg bid. - Continue eplerenone (did not tolerate spironolactone).  - Off lisinopril with rise in creatinine in the past to 2.7.  - He has a wide LBBB (>180 msec) with dyssynchrony on echo.  CRT could be very helpful for him.  He is of advanced age, but outside of his CHF he is quite functional.  We discussed CRT today and he wants to think about it.  If he is willing to consider, will send him to EP for evaluation.  3.  Tricuspid regurgitation: Moderate on today's echo with RV failure. 4. Ascites: Suspect this has been due hepatic congestion from RV failure.  Now resolved.  5. CKD: Stage 3.  HD would be difficult with RV failure.   - BMET today.  6. CAD: s/p CABG.  No ischemic-type chest pain.  Continue statin.  No ASA with use of warfarin and stable CAD.  Lexiscan Cardiolite in 8/17 showed areas of infarction with no ischemia, given CKD would  hold off on cardiac cath in absence of ischemia.  Good lipids in 10/19.   7. HTN: BP not elevated.   Followup in 6 wks.   Signed, [name], MD  09/24/2018  [place] [address] Heart and [place] [address] [phone] (office) [phone] (fax)

## 2018-10-01 NOTE — Telephone Encounter (Signed)
Weights appear stable, will forward to cardiologist as well.

## 2018-10-01 NOTE — Telephone Encounter (Signed)
Dr. Zigmund Daniel please advise pt reported weight 170

## 2018-10-01 NOTE — Telephone Encounter (Signed)
Erica from [facility] is calling to report  pt's weight 170 please advise what she need to do next,

## 2018-10-01 NOTE — Telephone Encounter (Signed)
Spoke with Danae Chen from [facility] gave Dr. Zigmund Daniel notes below and let her know he would forward to cardiologist.

## 2018-10-08 ENCOUNTER — Telehealth (HOSPITAL_COMMUNITY): Payer: Self-pay | Admitting: *Deleted

## 2018-10-08 LAB — BASIC METABOLIC PANEL
BUN: 80 — AB (ref 4–21)
Creatinine: 2 — AB (ref 0.6–1.3)
Glucose: 136
Potassium: 4.6 (ref 3.4–5.3)
Sodium: 139 (ref 137–147)

## 2018-10-08 NOTE — Telephone Encounter (Signed)
pts HHRN called stating pt is asymptomatic, his legs are "really skinny", and he doesn't appear to have fluid. Pt is concerned he is on too much diuretics and wants to know if his meds can be adjusted.   Routed to [facility] for advice

## 2018-10-08 NOTE — Telephone Encounter (Signed)
He needs to have BMET done.  Please arrange to get BMET in next day or two to see if his renal function is worse.

## 2018-10-09 ENCOUNTER — Encounter: Payer: Self-pay | Admitting: Family Medicine

## 2018-10-09 NOTE — Telephone Encounter (Signed)
Spoke with [facility] she stated she collected a BMET on patient yesterday and our office should receive results today.

## 2018-10-15 NOTE — Telephone Encounter (Signed)
Noted but they should be reporting to cardiology as well who is ordering physician.

## 2018-10-15 NOTE — Telephone Encounter (Signed)
[facility] calling Erica reporting weight of pt at 172, doing fine

## 2018-10-15 NOTE — Telephone Encounter (Signed)
FYI

## 2018-10-16 NOTE — Telephone Encounter (Signed)
Dr. Aundra Dubin, [place] on mutual pt weight.

## 2018-10-21 ENCOUNTER — Telehealth: Payer: Self-pay | Admitting: Family Medicine

## 2018-10-22 ENCOUNTER — Telehealth: Payer: Self-pay | Admitting: Family Medicine

## 2018-10-22 NOTE — Telephone Encounter (Signed)
FYI

## 2018-10-22 NOTE — Telephone Encounter (Signed)
Gary [name] with [facility] calling to report pt's weight today is 173.

## 2018-11-07 ENCOUNTER — Other Ambulatory Visit: Payer: Self-pay

## 2018-11-07 ENCOUNTER — Telehealth: Payer: Self-pay | Admitting: Family Medicine

## 2018-11-07 ENCOUNTER — Ambulatory Visit (INDEPENDENT_AMBULATORY_CARE_PROVIDER_SITE_OTHER): Payer: Medicare Other | Admitting: Family Medicine

## 2018-11-07 ENCOUNTER — Encounter: Payer: Self-pay | Admitting: Family Medicine

## 2018-11-07 DIAGNOSIS — Z20822 Contact with and (suspected) exposure to covid-19: Secondary | ICD-10-CM

## 2018-11-07 DIAGNOSIS — B349 Viral infection, unspecified: Secondary | ICD-10-CM | POA: Insufficient documentation

## 2018-11-07 NOTE — Assessment & Plan Note (Addendum)
-  Likely viral illness, symptoms seem to be improving some today but still with some loose stool and decreased appetite.  -COVID is lower on the differential however given his frail state and elevated risk of complications along with multiple people in and out of his home I think he should go ahead and be tested.  -Contacted [facility] that provides his home health and they are able to complete test at home.  Will await results.  Recommend continued supportive care and self isolation for now until results return.  -Discussed red flags that would prompt them to seek emergency care.

## 2018-11-07 NOTE — Telephone Encounter (Signed)
Dr. Zigmund Daniel FYI--nurse stated she will go see him Monday instead of today.   Copied from [place] [id number]. Topic: General - Other >> Nov 07, 2018 12:02 PM Rainey Pines A wrote: Dorian Pod from [facility] called and stated that they received order for patient to get covid test with their lab but patients daughter didn't trust their labs and has taken patient to covid testing drive thru. Dorian Pod can be reached at [phone]

## 2018-11-07 NOTE — Progress Notes (Signed)
Gary SCHORSCH Sr. - 85 y.o. male MRN [id number]  Date of birth: 1929/07/09   This visit type was conducted due to national recommendations for restrictions regarding the COVID-19 Pandemic (e.g. social distancing).  This format is felt to be most appropriate for this patient at this time.  All issues noted in this document were discussed and addressed.  No physical exam was performed (except for noted visual exam findings with Video Visits).  I discussed the limitations of evaluation and management by telemedicine and the availability of in person appointments. The patient expressed understanding and agreed to proceed.  I connected with@ on 11/07/18 at 10:40 AM EDT by a video enabled telemedicine application and verified that I am speaking with the correct person using two identifiers.   Patient Location: Home [place] [id number]   Provider location:   Home office  Chief Complaint  Patient presents with  . Sore Throat    pt is c/o of sore throat and diarrhea/started 4 days ago but today pt stated he is better.    HPI  Gary TEASE Sr. is a 85 y.o. male who presents via audio/video conferencing for a telehealth visit today.  He reports complaint of diarrhea and sore throat.  Symptoms started 4 days ago.  Family is concerned about possible COVID-19 as he has had numerous people in and out of the home for home health PT and respite care.  He reports that symptoms have improved some today and stool is a little more formed.  Reports appetite is decreased still. He is drinking a boost protein shake a couple of times per day.   He denies fever, chills or respiratory symptoms at this time.     ROS:  A comprehensive ROS was completed and negative except as noted per HPI  Past Medical History:  Diagnosis Date  . Anemia   . Anxiety   . Arrhythmia    afib  . Chronic systolic heart failure (HCC)    a. prior EF 20-30%;  b. Echo 7/09: EF 35-40%, basal inferoposterior HK,  mild AI, mild to moderate MR, moderate to marked LAE, mild to moderate TR, mildly increased PASP, moderate RAE;   c. Echo 01/2012:   mild LVH, EF 50%, Tr AI, mild MR, severe LAE, mod to severe RAE, mod to severe RVE, mod reduced RVSF, mod to severe TR, PASP 46  . Coronary artery disease   . GERD (gastroesophageal reflux disease)   . Heart attack ([place])    1983  . Hyperlipidemia   . Hypertension   . Neurogenic bladder    has had bladder catheter changes at the urology office every 60 d  . Renal failure   . Warfarin anticoagulation     Past Surgical History:  Procedure Laterality Date  . APPENDECTOMY    . CHOLECYSTECTOMY     1971  . CORONARY ARTERY BYPASS GRAFT     1995    Family History  Problem Relation Age of Onset  . Cancer Mother   . Heart attack Father        deceased at 35, smoking, ETOH abuse  . Breast cancer Sister     Social History   Socioeconomic History  . Marital status: Married    Spouse name: Not on file  . Number of children: Not on file  . Years of education: Not on file  . Highest education level: Not on file  Occupational History  . Occupation: Retired  Employer: RETIRED    Comment: 1994  Social Needs  . Financial resource strain: Not on file  . Food insecurity    Worry: Not on file    Inability: Not on file  . Transportation needs    Medical: Not on file    Non-medical: Not on file  Tobacco Use  . Smoking status: Former Research scientist (life sciences)  . Smokeless tobacco: Never Used  Substance and Sexual Activity  . Alcohol use: No  . Drug use: No  . Sexual activity: Not on file  Lifestyle  . Physical activity    Days per week: Not on file    Minutes per session: Not on file  . Stress: Not on file  Relationships  . Social Herbalist on phone: Not on file    Gets together: Not on file    Attends religious service: Not on file    Active member of club or organization: Not on file    Attends meetings of clubs or organizations: Not on file     Relationship status: Not on file  . Intimate partner violence    Fear of current or ex partner: Not on file    Emotionally abused: Not on file    Physically abused: Not on file    Forced sexual activity: Not on file  Other Topics Concern  . Not on file  Social History Narrative  . Not on file     Current Outpatient Medications:  .  albuterol (VENTOLIN HFA) 108 (90 Base) MCG/ACT inhaler, Inhale 2 puffs into the lungs every 6 (six) hours as needed for wheezing or shortness of breath., Disp: 1 Inhaler, Rfl: 3 .  apixaban (ELIQUIS) 2.5 MG TABS tablet, Take 1 tablet (2.5 mg total) by mouth 2 (two) times daily., Disp: 180 tablet, Rfl: 1 .  atorvastatin (LIPITOR) 20 MG tablet, Take 1 tablet (20 mg total) by mouth daily., Disp: 90 tablet, Rfl: 1 .  carvedilol (COREG) 12.5 MG tablet, Take 1 tablet (12.5 mg total) by mouth 2 (two) times daily with a meal., Disp: 180 tablet, Rfl: 3 .  clotrimazole-betamethasone (LOTRISONE) cream, APPLY TOPICALLY TWICE DAILY, Disp: 30 g, Rfl: 0 .  eplerenone (INSPRA) 25 MG tablet, Take 1 tablet (25 mg total) by mouth daily., Disp: 90 tablet, Rfl: 3 .  escitalopram (LEXAPRO) 20 MG tablet, TAKE 1 TABLET BY MOUTH DAILY, Disp: 30 tablet, Rfl: 10 .  ferrous sulfate 325 (65 FE) MG tablet, Take 325 mg by mouth daily with breakfast. , Disp: , Rfl:  .  fluticasone (CUTIVATE) 0.05 % cream, , Disp: , Rfl:  .  folic acid (FOLVITE) 1 MG tablet, Take 1 mg by mouth daily.  , Disp: , Rfl:  .  Glucos-Chondroit-Hyaluron-MSM (GLUCOSAMINE CHONDROITIN JOINT PO), Take 1 tablet by mouth 2 (two) times daily., Disp: , Rfl:  .  lidocaine (XYLOCAINE) 2 % jelly, Place 1 application into the urethra daily as needed (pain)., Disp: 30 mL, Rfl: 12 .  Meth-Hyo-M Bl-Na Phos-Ph Sal (URO-MP) 118 MG CAPS, TAKE 1-4 CAPSULES BY MOUTH EVERY DAY AS DIRECTED, Disp: 360 capsule, Rfl: 10 .  metolazone (ZAROXOLYN) 2.5 MG tablet, Take 1 tablet (2.5 mg total) by mouth once a week. ON Wednesday MORNING, Disp: 52  tablet, Rfl: 0 .  Multiple Vitamin (MULTIVITAMIN) capsule, Take 1 capsule by mouth daily.  , Disp: , Rfl:  .  neomycin-bacitracin-polymyxin (NEOSPORIN) ointment, Apply 1 application topically daily as needed for wound care., Disp: 15 g, Rfl: 0 .  nystatin-triamcinolone (MYCOLOG II) cream, , Disp: , Rfl:  .  omeprazole (PRILOSEC) 20 MG capsule, TAKE 1 CAPSULE BY MOUTH TWICE DAILY BEFORE MEALS, Disp: 60 capsule, Rfl: 10 .  ondansetron (ZOFRAN) 8 MG tablet, TAKE 1 TABLET BY MOUTH EVERY 8 HOURS AS NEEDED FOR NAUSEA OR VOMITING, Disp: 90 tablet, Rfl: 10 .  potassium chloride SA (K-DUR) 20 MEQ tablet, Take 1 tablet (20 mEq total) by mouth once a week. TAKE WITH WEEKLY METOLAZONE, Disp: 52 tablet, Rfl: 0 .  Saw Palmetto 450 MG CAPS, Take by mouth QID., Disp: , Rfl:  .  torsemide (DEMADEX) 20 MG tablet, Take 80 mg by mouth 2 (two) times daily., Disp: , Rfl:  .  HYDROcodone-acetaminophen (NORCO) 5-325 MG tablet, Take 0.5-1 tablets by mouth daily as needed for moderate pain (Take 45 minutes prior to catheter change.). (Patient not taking: Reported on 11/07/2018), Disp: 15 tablet, Rfl: 0 .  hydrOXYzine (ATARAX/VISTARIL) 25 MG tablet, TK 1 T PO QD PRN, Disp: , Rfl: 0  EXAM:  VITALS per patient if applicable: BP (!) 0000000 Comment: pt reprt  Pulse 73 Comment: pt reprt  Temp 97.6 F (36.4 C) (Oral) Comment: pt report  Ht 6' (1.829 m)   Wt 174 lb (78.9 kg) Comment: pt reprt  BMI 23.60 kg/m   GENERAL: alert, oriented, appears well and in no acute distress  HEENT: atraumatic, conjunttiva clear, no obvious abnormalities on inspection of external nose and ears  NECK: normal movements of the head and neck  LUNGS: on inspection no signs of respiratory distress, breathing rate appears normal, no obvious gross SOB, gasping or wheezing  CV: no obvious cyanosis  MS: moves all visible extremities without noticeable abnormality  PSYCH/NEURO: pleasant and cooperative, no obvious depression or anxiety, speech  and thought processing grossly intact  ASSESSMENT AND PLAN:  Discussed the following assessment and plan:  Viral syndrome -Likely viral illness, symptoms seem to be improving some today but still with some loose stool and decreased appetite.  -COVID is lower on the differential however given his frail state and elevated risk of complications along with multiple people in and out of his home I think he should go ahead and be tested.  -Contacted [facility] that provides his home health and they are able to complete test at home.  Will await results.  Recommend continued supportive care and self isolation for now until results return.  -Discussed red flags that would prompt them to seek emergency care.     >25 minutes spent with patient and family with 50% of time spent providing counseling and coordination of care as outlined above.     I discussed the assessment and treatment plan with the patient. The patient was provided an opportunity to ask questions and all were answered. The patient agreed with the plan and demonstrated an understanding of the instructions.   The patient was advised to call back or seek an in-person evaluation if the symptoms worsen or if the condition fails to improve as anticipated.    Luetta Nutting, DO

## 2018-11-08 LAB — NOVEL CORONAVIRUS, NAA: SARS-CoV-2, NAA: NOT DETECTED

## 2018-11-08 NOTE — Telephone Encounter (Signed)
Ok, thanks.

## 2018-11-08 NOTE — Telephone Encounter (Signed)
Are they going to Drive thru at [facility]?

## 2018-11-08 NOTE — Telephone Encounter (Signed)
Pt went to drive thru at [facility]. Pt's daughter stated that it might take 24-48 hrs for result to come back.

## 2018-11-11 ENCOUNTER — Telehealth: Payer: Self-pay | Admitting: Family Medicine

## 2018-11-11 NOTE — Telephone Encounter (Signed)
Copied from [place] [id number]. Topic: Quick Communication - Home Health Verbal Orders >> Nov 11, 2018  3:49 PM Yvette Rack wrote: Caller/Agency: Dorian Pod with [facility] Callback Number: [phone] Requesting OT/PT/Skilled Nursing/Social Work/Speech Therapy: skilled nursing Frequency: every other week for 8 weeks to monitor and access

## 2018-11-11 NOTE — Telephone Encounter (Signed)
Ok for VO. 

## 2018-11-11 NOTE — Telephone Encounter (Signed)
Okay to give verbal? 

## 2018-11-12 NOTE — Telephone Encounter (Signed)
Verbal order given to Dorian Pod from [facility]

## 2018-11-14 ENCOUNTER — Ambulatory Visit (HOSPITAL_COMMUNITY)
Admission: RE | Admit: 2018-11-14 | Discharge: 2018-11-14 | Disposition: A | Discharge status: Home / Self Care | Payer: Medicare Other | Source: Ambulatory Visit | Attending: Cardiology | Admitting: Cardiology

## 2018-11-14 ENCOUNTER — Encounter (HOSPITAL_COMMUNITY): Payer: Medicare Other | Admitting: Cardiology

## 2018-11-14 ENCOUNTER — Other Ambulatory Visit: Payer: Self-pay

## 2018-11-14 VITALS — BP 107/49 | HR 60 | Wt 174.0 lb

## 2018-11-14 DIAGNOSIS — K862 Cyst of pancreas: Secondary | ICD-10-CM | POA: Diagnosis not present

## 2018-11-14 DIAGNOSIS — I130 Hypertensive heart and chronic kidney disease with heart failure and stage 1 through stage 4 chronic kidney disease, or unspecified chronic kidney disease: Secondary | ICD-10-CM | POA: Diagnosis not present

## 2018-11-14 DIAGNOSIS — E785 Hyperlipidemia, unspecified: Secondary | ICD-10-CM | POA: Diagnosis not present

## 2018-11-14 DIAGNOSIS — Z880 Allergy status to penicillin: Secondary | ICD-10-CM | POA: Insufficient documentation

## 2018-11-14 DIAGNOSIS — I2510 Atherosclerotic heart disease of native coronary artery without angina pectoris: Secondary | ICD-10-CM | POA: Insufficient documentation

## 2018-11-14 DIAGNOSIS — I255 Ischemic cardiomyopathy: Secondary | ICD-10-CM | POA: Diagnosis not present

## 2018-11-14 DIAGNOSIS — I4821 Permanent atrial fibrillation: Secondary | ICD-10-CM | POA: Insufficient documentation

## 2018-11-14 DIAGNOSIS — R188 Other ascites: Secondary | ICD-10-CM | POA: Insufficient documentation

## 2018-11-14 DIAGNOSIS — I5022 Chronic systolic (congestive) heart failure: Secondary | ICD-10-CM

## 2018-11-14 DIAGNOSIS — I447 Left bundle-branch block, unspecified: Secondary | ICD-10-CM | POA: Insufficient documentation

## 2018-11-14 DIAGNOSIS — I252 Old myocardial infarction: Secondary | ICD-10-CM | POA: Diagnosis not present

## 2018-11-14 DIAGNOSIS — Z79899 Other long term (current) drug therapy: Secondary | ICD-10-CM | POA: Diagnosis not present

## 2018-11-14 DIAGNOSIS — Z7901 Long term (current) use of anticoagulants: Secondary | ICD-10-CM | POA: Insufficient documentation

## 2018-11-14 DIAGNOSIS — Z885 Allergy status to narcotic agent status: Secondary | ICD-10-CM | POA: Insufficient documentation

## 2018-11-14 DIAGNOSIS — I5042 Chronic combined systolic (congestive) and diastolic (congestive) heart failure: Secondary | ICD-10-CM

## 2018-11-14 DIAGNOSIS — M545 Low back pain: Secondary | ICD-10-CM | POA: Diagnosis not present

## 2018-11-14 DIAGNOSIS — Z882 Allergy status to sulfonamides status: Secondary | ICD-10-CM | POA: Insufficient documentation

## 2018-11-14 DIAGNOSIS — N183 Chronic kidney disease, stage 3 (moderate): Secondary | ICD-10-CM | POA: Diagnosis not present

## 2018-11-14 DIAGNOSIS — Z87891 Personal history of nicotine dependence: Secondary | ICD-10-CM | POA: Insufficient documentation

## 2018-11-14 DIAGNOSIS — Z8249 Family history of ischemic heart disease and other diseases of the circulatory system: Secondary | ICD-10-CM | POA: Insufficient documentation

## 2018-11-14 DIAGNOSIS — N319 Neuromuscular dysfunction of bladder, unspecified: Secondary | ICD-10-CM | POA: Diagnosis not present

## 2018-11-14 DIAGNOSIS — Z951 Presence of aortocoronary bypass graft: Secondary | ICD-10-CM | POA: Insufficient documentation

## 2018-11-14 LAB — BASIC METABOLIC PANEL
Anion gap: 11 (ref 5–15)
BUN: 64 mg/dL — ABNORMAL HIGH (ref 8–23)
CO2: 33 mmol/L — ABNORMAL HIGH (ref 22–32)
Calcium: 9.8 mg/dL (ref 8.9–10.3)
Chloride: 93 mmol/L — ABNORMAL LOW (ref 98–111)
Creatinine, Ser: 1.91 mg/dL — ABNORMAL HIGH (ref 0.61–1.24)
GFR calc Af Amer: 36 mL/min — ABNORMAL LOW (ref >60)
GFR calc non Af Amer: 31 mL/min — ABNORMAL LOW (ref >60)
Glucose, Bld: 96 mg/dL (ref 70–99)
Potassium: 4.3 mmol/L (ref 3.5–5.1)
Sodium: 137 mmol/L (ref 135–145)

## 2018-11-14 LAB — CBC
HCT: 42.7 % (ref 39.0–52.0)
Hemoglobin: 13 g/dL (ref 13.0–17.0)
MCH: 27.1 pg (ref 26.0–34.0)
MCHC: 30.4 g/dL (ref 30.0–36.0)
MCV: 89 fL (ref 80.0–100.0)
Platelets: 150 10*3/uL (ref 150–400)
RBC: 4.8 MIL/uL (ref 4.22–5.81)
RDW: 17 % — ABNORMAL HIGH (ref 11.5–15.5)
WBC: 4.3 10*3/uL (ref 4.0–10.5)
nRBC: 0 % (ref 0.0–0.2)

## 2018-11-14 NOTE — Patient Instructions (Signed)
Lab work done today. We will notify you of any abnormal lab work. No news is good news!  You have been referred to Cardiac Electrophysiology. Their office will contact you in order to schedule an appointment.  Please follow up with the [facility] in 3 months.  At the [facility], you and your health needs are our priority. As part of our continuing mission to provide you with exceptional heart care, we have created designated Provider Care Teams. These Care Teams include your primary Cardiologist (physician) and Advanced Practice Providers (APPs- Physician Assistants and Nurse Practitioners) who all work together to provide you with the care you need, when you need it.   You may see any of the following providers on your designated Care Team at your next follow up: Marland Kitchen Dr Glori Bickers . Dr [name] . Darrick Grinder, NP   Please be sure to bring in all your medications bottles to every appointment.

## 2018-11-14 NOTE — Progress Notes (Signed)
Date:  11/14/2018   ID:  Gary Lope Sr., DOB March 05, 1929, MRN [id number]   Provider location: [place] [facility] Type of Visit: Established patient   PCP:  Luetta Nutting, DO  Cardiologist:  Dr. Aundra Dubin   History of Present Illness: Gary IAQUINTA Sr. is a 85 y.o. male who has a history of CAD s/p CABG in 1995, chronic atrial fibrillation, CKD, and chronic diastolic CHF with prominent RV failure.  Patient has struggled with right-sided failure. He was admitted in 5/16 with abdominal distention, volume overload, and elevated creatinine as high as 6.  Echo showed normal LV EF but RV was moderately dilated with moderate-severe TR, ?Ebsteins anomaly anatomy.  He had a paracentesis for ascites.  V/Q scan was negative for PE. He refused RHC while in the hospital.  He was diuresed and creatinine improved.  Repeat echo was done 6/17.  This showed EF down to 20-25% with regional wall motion abnormalities, moderately dilated RV, only mild TR with PASP 44 mmHg.  Lexiscan Cardiolite in 8/17 showed EF 39% with fixed defects, no ischemia.  Echo in 3/19 with EF 30-35% and prominent RV dysfunction.   Echo in 7/20 showed EF 20-25% with diffuse hypokinesis and prominent dyssynchrony, moderate RV dilation with severely decreased RV systolic function, mild-moderate MR, moderate TR, dilated IVC.   He returns today for followup of CHF and atrial fibrillation.  He has been doing better on higher dose of torsemide.  Weight is down 3 lbs.  Still working daily in his woodshop, just made all his grandchildren's Christmas presents.  He is short of breath if he walks more than about 50 yards.  He is limited by arthritis: low back pain, neck pain, hip pain.  No chest pain. He has bendopnea but no orthopnea/PND.   Labs (5/16): K 3.6, creatinine 6.15 => 2.47, LFTs normal, LDL 23, HCT 25.2 Labs (07/28/14): K 4.6, Creatinine 2.62, BNP 463 Labs (6/16): creatinine 2.23 => 2.01, K 4.4, BNP 227 Labs (8/16):  LFTs normal Labs (12/16): K 4, creatinine 2.11, HCT 40.8 Labs (6/17): K 4.5, creatinine 1.6, LDL 37, HDL 30 Labs (8/17): K 4.2, creatinine 1.77, BNP 176 Labs (1/18): K 4.4, creatinine 1.75 Labs (9/18): K 5, creatinine 1.7 Labs (12/18): LDL 41, HDL 36 Labs (2/19): K 4.7, creatinine 2.72, hgb 11.5 Labs (3/19): K 4.6, creatinine 1.9 Labs (5/19): K 4.2, creatinine 1.89  Labs (10/19): K 4.7, creatinine 1.85, LDL 34 Labs (6/20): hgb 12 Labs (7/20): K 4, creatinine 2.01  Labs (8/20): K 4.6, creatinine 2.0  PMH:  1. CAD: MI 1983, CABG 1995.   - Lexiscan Cardiolite (8/17) with EF 39%, fixed inferior/inferoseptal defect and fixed apical septal/apical defect.  2. Chronic systolic failure with prominent RV failure: ?Ischemic cardiomyopathy.  - Echo (5/16) with EF 55-60%, moderate LV hypertrophy, moderately dilated RV, massive biatrial enlargement, moderate-severe TR with some question of Ebstein's anomaly, PA systolic pressure 37 mmHg.  Patient has had ascites likely related to RV failure with h/o paracentesis.  - Echo (6/17) with EF 20-25%, regional wall motion abnormalities, mild MR, moderately dilated RV with normal systolic function, mild TR (improved from prior), PASP 44 mmHg.  - Unable to tolerate spironolactone.  - Echo (3/19) with EF 30-35% with D-shaped septum and moderately dilated/moderately dysfunctional RV, moderate-severe TR, mild to moderate MR.  PASP 41 mmHg, dilated IVC.  - Echo (7/20): EF 20-25%, diffuse HK with inferior AK and septal-lateral dyssynchrony, moderately dilated RV with severely decreased  systolic function, mild-moderate MR, moderate TR, IVC dilated.  3. CKD stage 3 4. Permanent atrial fibrillation 5. LBBB 6. Hyperlipidemia 7. HTN 8. Complex pancreatic cyst: Followed by GI.  9. Neurogenic bladder: Chronic foley.  10. Anemia of renal disease.  11. Cholecystectomy 12. Appendectomy 13. Low back pain.   Current Outpatient Medications  Medication Sig Dispense  Refill  . apixaban (ELIQUIS) 2.5 MG TABS tablet Take 1 tablet (2.5 mg total) by mouth 2 (two) times daily. 180 tablet 1  . atorvastatin (LIPITOR) 20 MG tablet Take 1 tablet (20 mg total) by mouth daily. 90 tablet 1  . carvedilol (COREG) 6.25 MG tablet Take 6.25 mg by mouth 2 (two) times daily with a meal.    . eplerenone (INSPRA) 25 MG tablet Take 1 tablet (25 mg total) by mouth daily. 90 tablet 3  . escitalopram (LEXAPRO) 20 MG tablet TAKE 1 TABLET BY MOUTH DAILY 30 tablet 10  . ferrous sulfate 325 (65 FE) MG tablet Take 325 mg by mouth daily with breakfast.     . folic acid (FOLVITE) 1 MG tablet Take 1 mg by mouth daily.      . Glucos-Chondroit-Hyaluron-MSM (GLUCOSAMINE CHONDROITIN JOINT PO) Take 1 tablet by mouth 2 (two) times daily.    Marland Kitchen lidocaine (XYLOCAINE) 2 % jelly Place 1 application into the urethra daily as needed (pain). 30 mL 12  . Melatonin 10 MG CAPS Take by mouth.    . Meth-Hyo-M Bl-Na Phos-Ph Sal (URO-MP) 118 MG CAPS TAKE 1-4 CAPSULES BY MOUTH EVERY DAY AS DIRECTED 360 capsule 10  . metolazone (ZAROXOLYN) 2.5 MG tablet Take 1 tablet (2.5 mg total) by mouth once a week. ON Wednesday MORNING 52 tablet 0  . Multiple Vitamin (MULTIVITAMIN) capsule Take 1 capsule by mouth daily.      Marland Kitchen neomycin-bacitracin-polymyxin (NEOSPORIN) ointment Apply 1 application topically daily as needed for wound care. 15 g 0  . nystatin-triamcinolone (MYCOLOG II) cream     . omeprazole (PRILOSEC) 20 MG capsule TAKE 1 CAPSULE BY MOUTH TWICE DAILY BEFORE MEALS 60 capsule 10  . Saw Palmetto 450 MG CAPS Take by mouth QID.    Marland Kitchen torsemide (DEMADEX) 20 MG tablet Take 80 mg by mouth 2 (two) times daily.    Marland Kitchen albuterol (VENTOLIN HFA) 108 (90 Base) MCG/ACT inhaler Inhale 2 puffs into the lungs every 6 (six) hours as needed for wheezing or shortness of breath. 1 Inhaler 3  . clotrimazole-betamethasone (LOTRISONE) cream APPLY TOPICALLY TWICE DAILY 30 g 0  . fluticasone (CUTIVATE) 0.05 % cream     . hydrOXYzine  (ATARAX/VISTARIL) 25 MG tablet TK 1 T PO QD PRN  0  . potassium chloride SA (K-DUR) 20 MEQ tablet Take 1 tablet (20 mEq total) by mouth once a week. TAKE WITH WEEKLY METOLAZONE 52 tablet 0   No current facility-administered medications for this encounter.     Allergies:   Amoxicillin, Diltiazem hcl, Hydrocodone-acetaminophen, Niacin, Niacin-lovastatin er, Paroxetine, Propoxyphene n-acetaminophen, Pseudoephedrine, Spironolactone, Sulfonamide derivatives, and Tolterodine tartrate   Social History:  The patient  reports that he has quit smoking. He has never used smokeless tobacco. He reports that he does not drink alcohol or use drugs.   Family History:  The patient's family history includes Breast cancer in his sister; Cancer in his mother; Heart attack in his father.   ROS:  Please see the history of present illness.   All other systems are personally reviewed and negative.   Exam:   BP (!) 107/49  Pulse 60   Wt 78.9 kg (174 lb)   SpO2 97%   BMI 23.60 kg/m  General: NAD Neck: JVP not elevated, no thyromegaly or thyroid nodule.  Lungs: Clear to auscultation bilaterally with normal respiratory effort. CV: Nondisplaced PMI.  Heart irregular S1/S2, no S3/S4, 2/6 HSM LLSB.  Trace ankle edema.  No carotid bruit.  Normal pedal pulses.  Abdomen: Soft, nontender, no hepatosplenomegaly, no distention.  Skin: Intact without lesions or rashes.  Neurologic: Alert and oriented x 3.  Psych: Normal affect. Extremities: No clubbing or cyanosis.  HEENT: Normal.   Recent Labs: 11/14/2018: BUN 64; Creatinine, Ser 1.91; Hemoglobin 13.0; Platelets 150; Potassium 4.3; Sodium 137  Personally reviewed   Wt Readings from Last 3 Encounters:  11/14/18 78.9 kg (174 lb)  11/07/18 78.9 kg (174 lb)  09/23/18 80.4 kg (177 lb 3.2 oz)      ASSESSMENT AND PLAN:  1. Atrial fibrillation: Permanent.  Dilated atria on echo, unlikely to stay in NSR if DCCV were to be attempted. He is on apixaban 2.5 bid.  HR is  controlled.   2. Chronic systolic CHF with prominent RV failure:  In the past, echo had shown RV dysfunction but normal LV systolic function.  Echo in 6/17, however, showed EF 20-25% with wall motion abnormalities and moderately dilated RV.  Concern for ischemic cardiomyopathy with worsening of coronary disease, but he has had no event consistent with MI (chest, arm or neck pain) and his symptoms have been very stable.  Lexiscan Cardiolite in 8/17 showed only fixed defects, no ischemia.  No evidence that he has tachycardia-mediated cardiomyopathy (rate has been controlled).  Echo in 3/19 showed EF 30-35%, D-shaped interventricular septum, dysfunctional RV, and moderate-severe TR.  Echo in 7/20 with EF 20-25%, dyssynchrony, moderated dilated RV with severe systolic dysfunction, moderate TR.  CHF has been slowly worsening with a steadily increasing diuretic requirement.  I am limited in medication management due to low BP and elevated creatinine. Weight is down today and volume looks ok on current diuretic regimen.  - Keep torsemide at 80 mg bid.  Will get BMET.  - Continue metolazone 2.5 mg once weekly with 20 mEq KCl on metolazone days.  - Continue Coreg 12.5 mg bid. - Continue eplerenone (did not tolerate spironolactone).  - Off lisinopril with rise in creatinine in the past to 2.7.  - He has a wide LBBB (>180 msec) with dyssynchrony on echo.  CRT could be helpful for him.  He is of advanced age, but outside of his CHF he is quite functional.  As above, CHF has been slowly worsening and I am limited in pushing more aggressive medical management.  He would like to talk to EP about this.  The one major concern other than his age is his chronic indwelling foley for neurogenic bladder, but he has not had frequent UTIs.  3. Tricuspid regurgitation: Moderate last echo with RV failure. 4. Ascites: Suspect this has been due hepatic congestion from RV failure.  Now resolved.  5. CKD: Stage 3.  HD would be  difficult with RV failure.   - BMET today.  6. CAD: s/p CABG.  No ischemic-type chest pain.  Continue statin.  No ASA with use of warfarin and stable CAD.  Lexiscan Cardiolite in 8/17 showed areas of infarction with no ischemia, given CKD would hold off on cardiac cath in absence of ischemia.  Good lipids in 10/19.    Followup in 3 months.   Signed, [name],  MD  11/14/2018  [place] [address] [facility] and [place] [address] [phone] (office) [phone] (fax)

## 2018-11-18 ENCOUNTER — Telehealth: Payer: Medicare Other | Admitting: Internal Medicine

## 2018-11-28 ENCOUNTER — Encounter: Payer: Self-pay | Admitting: Family Medicine

## 2018-11-29 NOTE — Telephone Encounter (Signed)
Letter signed

## 2018-12-02 ENCOUNTER — Telehealth: Payer: Self-pay

## 2018-12-02 NOTE — Telephone Encounter (Signed)
Spoke with [name] and he confirmed that he wanted this faxed with attn to [place]. Letter was faxed to number provided. Letter was placed back up front.

## 2018-12-02 NOTE — Telephone Encounter (Signed)
Copied from [place] [phone]. Topic: General - Inquiry >> Dec 02, 2018  9:47 AM Virl Axe D wrote: Reason for CRM: [name] would like to request the letter of Competence that Dr. Zigmund Daniel signed to be faxed directly to lawyer's office. It is waiting at the front desk to be mailed. Please advise. [phone]

## 2018-12-05 ENCOUNTER — Encounter: Payer: Self-pay | Admitting: Internal Medicine

## 2018-12-25 ENCOUNTER — Telehealth: Payer: Self-pay | Admitting: Family Medicine

## 2018-12-25 NOTE — Telephone Encounter (Signed)
rx refill neomycin-bacitracin-polymyxin (NEOSPORIN) ointment PHARMACY [facility] - [address] [place] [phone] (Phone) [phone] (Fax

## 2018-12-30 ENCOUNTER — Other Ambulatory Visit: Payer: Self-pay | Admitting: Family Medicine

## 2018-12-30 DIAGNOSIS — R110 Nausea: Secondary | ICD-10-CM

## 2019-01-06 ENCOUNTER — Other Ambulatory Visit (HOSPITAL_COMMUNITY): Payer: Self-pay | Admitting: Cardiology

## 2019-01-08 ENCOUNTER — Telehealth: Payer: Self-pay

## 2019-01-08 NOTE — Telephone Encounter (Signed)
It would be helpful if we had the name of the cream. I have not prescribed anything recently for pain

## 2019-01-08 NOTE — Telephone Encounter (Signed)
Pt son said dad gets confused sometimes about his meds but he has a rash on his legs he think he should be seen for so he's going to call back to schedule appt.

## 2019-01-08 NOTE — Telephone Encounter (Signed)
Copied from [place] [phone]. Topic: General - Other >> Jan 07, 2019  5:31 PM Yvette Rack wrote: Reason for CRM: Dawson with [facility] called to report that pt informed them that the ointment for pain that was prescribed by Dr. Zigmund Daniel is not working. Jasmine stated she did not have the name of the medication it just says triple ointment.

## 2019-01-10 ENCOUNTER — Telehealth: Payer: Self-pay | Admitting: Family Medicine

## 2019-01-10 NOTE — Telephone Encounter (Signed)
Fancy calling from [facility] calling to report that the patient states that a pain relief creme was prescribed and it is not working.  Please Submit to [facility] if approved. [facility]- [phone]

## 2019-01-10 NOTE — Telephone Encounter (Signed)
I thought they are scheduling an appt with me regarding this.

## 2019-01-10 NOTE — Telephone Encounter (Signed)
Yess I see the appt is on the 23rd.

## 2019-01-13 ENCOUNTER — Other Ambulatory Visit (HOSPITAL_COMMUNITY): Payer: Self-pay | Admitting: *Deleted

## 2019-01-13 MED ORDER — METOLAZONE 2.5 MG PO TABS: 2.5000 mg | ORAL_TABLET | ORAL | 3 refills | Status: DC

## 2019-01-17 ENCOUNTER — Telehealth: Payer: Self-pay

## 2019-01-17 NOTE — Telephone Encounter (Signed)
Questions for Screening COVID-19    Travel or Contacts: no  During this illness, did/does the patient experience any of the following symptoms? Fever >100.4F []   Yes [x]   No []   Unknown Subjective fever (felt feverish) []   Yes [x]   No []   Unknown Chills []   Yes [x]   No []   Unknown Muscle aches (myalgia) []   Yes [x]   No []   Unknown Runny nose (rhinorrhea) []   Yes [x]   No []   Unknown Sore throat []   Yes [x]   No []   Unknown Cough (new onset or worsening of chronic cough) []   Yes [x]   No []   Unknown Shortness of breath (dyspnea) []   Yes [x]   No []   Unknown Nausea or vomiting []   Yes [x]   No []   Unknown Headache []   Yes [x]   No []   Unknown Abdominal pain  []   Yes [x]   No []   Unknown Diarrhea (?3 loose/looser than normal stools/24hr period) []   Yes [x]   No []   Unknown

## 2019-01-20 ENCOUNTER — Other Ambulatory Visit: Payer: Self-pay

## 2019-01-20 ENCOUNTER — Ambulatory Visit: Payer: Medicare Other | Admitting: Family Medicine

## 2019-01-20 ENCOUNTER — Encounter: Payer: Self-pay | Admitting: Family Medicine

## 2019-01-20 VITALS — BP 102/62 | HR 80 | Temp 96.6°F | Ht 72.0 in | Wt 174.0 lb

## 2019-01-20 DIAGNOSIS — N184 Chronic kidney disease, stage 4 (severe): Secondary | ICD-10-CM

## 2019-01-20 DIAGNOSIS — L299 Pruritus, unspecified: Secondary | ICD-10-CM | POA: Diagnosis not present

## 2019-01-20 DIAGNOSIS — G3184 Mild cognitive impairment, so stated: Secondary | ICD-10-CM | POA: Diagnosis not present

## 2019-01-20 DIAGNOSIS — F419 Anxiety disorder, unspecified: Secondary | ICD-10-CM | POA: Diagnosis not present

## 2019-01-20 LAB — COMPREHENSIVE METABOLIC PANEL
ALT: 10 U/L (ref 0–53)
AST: 18 U/L (ref 0–37)
Albumin: 3.9 g/dL (ref 3.5–5.2)
Alkaline Phosphatase: 60 U/L (ref 39–117)
BUN: 52 mg/dL — ABNORMAL HIGH (ref 6–23)
CO2: 40 mEq/L — ABNORMAL HIGH (ref 19–32)
Calcium: 10.6 mg/dL — ABNORMAL HIGH (ref 8.4–10.5)
Chloride: 92 mEq/L — ABNORMAL LOW (ref 96–112)
Creatinine, Ser: 1.71 mg/dL — ABNORMAL HIGH (ref 0.40–1.50)
GFR: 37.98 mL/min — ABNORMAL LOW (ref >60.00)
Glucose, Bld: 96 mg/dL (ref 70–99)
Potassium: 4.6 mEq/L (ref 3.5–5.1)
Sodium: 140 mEq/L (ref 135–145)
Total Bilirubin: 1.2 mg/dL (ref 0.2–1.2)
Total Protein: 7.5 g/dL (ref 6.0–8.3)

## 2019-01-20 LAB — TSH: TSH: 2.03 u[IU]/mL (ref 0.35–4.50)

## 2019-01-20 MED ORDER — TRIAMCINOLONE ACETONIDE 0.1 % EX CREA: TOPICAL_CREAM | CUTANEOUS | 1 refills | Status: DC

## 2019-01-20 MED ORDER — UNNA-FLEX ELASTIC UNNA BOOT EX MISC: CUTANEOUS | 0 refills | Status: DC

## 2019-01-20 MED ORDER — BUSPIRONE HCL 7.5 MG PO TABS: 7.5000 mg | ORAL_TABLET | Freq: Two times a day (BID) | ORAL | 0 refills | Status: AC

## 2019-01-20 NOTE — Assessment & Plan Note (Signed)
Skin is xerotic.  Discussed trying Sarna anti-itch lotion. Check TSH.  Has evidence of venous stasis and mild edema of lower extremities, has had relief with unna boot previously, will reorder this.  Instructed to apply triamcinolone prior to application of unna boot.

## 2019-01-20 NOTE — Assessment & Plan Note (Signed)
Significant anxiety worsened by catheter changes.  Continue lexapro, be sure to take each night.  Will add on buspar as well for anxiety.  Can consider augmentation of lexapro with bupropion as well at f/u if still having significant symptoms.

## 2019-01-20 NOTE — Progress Notes (Signed)
Gary CASTLES Sr. - 85 y.o. male MRN [id number]  Date of birth: 11/28/1929  Subjective Chief Complaint  Patient presents with   Follow-up    rash on right leg down to foot. severe itching over pt body.    HPI Gary KILLMER Sr. is a 85 y.o. male with history of HTN, cardiomyopathy with combined CHF, neurogenic bladder and depression with anxiety here today for follow up visit.  He is accompanied by his son, [name] today.  They have concern about depression and anxiety, chronic itching and   Depression and anxiety: Reports increased depressive and anxiety symptoms.  He states he is taking lexapro each night.  He is tolerating well.  A lot of anxiety revolves around catheter change.  States that he will start worrying about having this done about 2 weeks prior to having it done due to pain he experiences.  He has norco to take prior to change but doesn't feel like it is helpful.  He takes this about 20 min prior.  Has passive death wishes but denies plan for suicide.     Depression screen [facility] 2/9 01/20/2019 01/20/2019 08/13/2015  Decreased Interest 1 3 0  Down, Depressed, Hopeless 3 3 0  PHQ - 2 Score 4 6 0  Altered sleeping 3 - -  Tired, decreased energy 1 - -  Change in appetite 3 - -  Feeling bad or failure about yourself  3 - -  Trouble concentrating 1 - -  Moving slowly or fidgety/restless 1 - -  Suicidal thoughts 3 - -  PHQ-9 Score 19 - -  Some recent data might be hidden   GAD 7 : Generalized Anxiety Score 01/20/2019  Nervous, Anxious, on Edge 1  Control/stop worrying 3  Worry too much - different things 3  Trouble relaxing 3  Restless 3  Easily annoyed or irritable 2  Afraid - awful might happen 3  Total GAD 7 Score 18    Itching: Reports diffuse itching, worst on R foot and leg.  He has tried gold bond lotion with variable improvement.  He has used Haematologist on foot which has been beneficial in the past.  He denies bleeding or drainage from areas.     Memory:   History of mild memory loss.  Son would like a test for a baseline to see where he is at cognitively at this time.  Maybe some slight decline over the past few months but nothing drastic.  He does have good support from family members.  He does not drive and has help from family at home.  ROS:  A comprehensive ROS was completed and negative except as noted per HPI   Allergies  Allergen Reactions   Amoxicillin     REACTION: ha/nausea   Diltiazem Hcl     REACTION: low bp   Hydrocodone-Acetaminophen     REACTION: ha   Niacin     REACTION: ha/nausea   Niacin-Lovastatin Er     REACTION: ha/nausea   Paroxetine     REACTION: ha/disoriented   Propoxyphene N-Acetaminophen     REACTION: headache   Pseudoephedrine     REACTION: stopped urine flow   Spironolactone Itching   Sulfonamide Derivatives     REACTION: ha/nausea   Tolterodine Tartrate     REACTION: bladder stopped    Past Medical History:  Diagnosis Date   Acute on chronic systolic and diastolic heart failure, NYHA class 4 ([place]) 07/14/2014   Acute on chronic systolic  and diastolic heart failure, NYHA class 4 ([place]) 07/14/2014   Anemia    ANEMIA NOS 09/10/2006   Annotation: reportedly diagnosed with anemia in 2007, had a colonoscopy,  radiographic scan  for bleeding and a  esophageal evaluation elsewhere.   Workup negative according with the patient Qualifier: Diagnosis of  By: Larose Kells MD, [place]    Anxiety    Arrhythmia    afib   Arthritis of multiple sites 03/09/2017   CAD (coronary artery disease) 01/15/2013   CARDIOMYOPATHY, ISCHEMIC 06/17/2007   Qualifier: Diagnosis of  By: Larose Kells MD, [place]    Cellulitis 07/13/2010   Chronic combined systolic and diastolic heart failure ([place]) 01/15/2013   Chronic indwelling Foley catheter [id number]   Chronic systolic heart failure ([place])    a. prior EF 20-30%;  b. Echo 7/09: EF 35-40%, basal inferoposterior HK, mild AI, mild to moderate MR, moderate to marked LAE, mild to  moderate TR, mildly increased PASP, moderate RAE;   c. Echo 01/2012:   mild LVH, EF 50%, Tr AI, mild MR, severe LAE, mod to severe RAE, mod to severe RVE, mod reduced RVSF, mod to severe TR, PASP 46   CKD (chronic kidney disease), stage III 06/16/2013   CKD (chronic kidney disease), stage IV ([place]) 07/15/2014   Coronary artery disease    Depression 03/09/2017   Essential hypertension-now hypotensive 09/10/2006   Qualifier: Diagnosis of  By: Larose Kells MD, Alda Berthold.    Finding of prostate - prominent by CT 05/2013 06/16/2013   GERD (gastroesophageal reflux disease)    Heart attack ([place])    1983   Hyperlipidemia    Hypertension    LBBB (left bundle branch block) 06/16/2009   Qualifier: Diagnosis of  By: [place], LPN, [place]     Long-term (current) use of anticoagulants 07/08/2014   Nausea 01/04/2018   Neurogenic bladder    has had bladder catheter changes at the urology office every 60 d   Neurogenic bladder disorder 03/09/2017   Other malaise and fatigue 07/01/2012   Pancreatic lesion 06/16/2013   Permanent atrial fibrillation ([place]) 10/04/2006   Qualifier: Diagnosis of  By: Linna Darner MD, Gwyndolyn Saxon     RAD (reactive airway disease) with wheezing, mild intermittent, uncomplicated [id number]   Recurrent UTI 04/18/2011   Renal failure    Right heart failure ([place]) 06/16/2013   URINARY INCONTINENCE 09/10/2006   Annotation: following two episodes of urinary retention.  No history of  previous surgeries Qualifier: Diagnosis of  By: Larose Kells MD, Jose E.    Valvular heart disease - moderate MR, mild AI, severe TR 06/16/2013   Warfarin anticoagulation     Past Surgical History:  Procedure Laterality Date   APPENDECTOMY     CHOLECYSTECTOMY     1971   CORONARY ARTERY BYPASS GRAFT     1995    Social History   Socioeconomic History   Marital status: Married    Spouse name: Not on file   Number of children: Not on file   Years of education: Not on file   Highest education level: Not on file    Occupational History   Occupation: Retired    Fish farm manager: RETIRED    Comment: 1994  Social Designer, fashion/clothing strain: Not on file   Food insecurity    Worry: Not on file    Inability: Not on file   Transportation needs    Medical: Not on file    Non-medical: Not on file  Tobacco Use  Smoking status: Former Smoker   Smokeless tobacco: Never Used  Substance and Sexual Activity   Alcohol use: No   Drug use: No   Sexual activity: Not on file  Lifestyle   Physical activity    Days per week: Not on file    Minutes per session: Not on file   Stress: Not on file  Relationships   Social connections    Talks on phone: Not on file    Gets together: Not on file    Attends religious service: Not on file    Active member of club or organization: Not on file    Attends meetings of clubs or organizations: Not on file    Relationship status: Not on file  Other Topics Concern   Not on file  Social History Narrative   Not on file    Family History  Problem Relation Age of Onset   Cancer Mother    Heart attack Father        deceased at 60, smoking, ETOH abuse   Breast cancer Sister     Health Maintenance  Topic Date Due   TETANUS/TDAP  03/16/1950   COLONOSCOPY  06/08/2008   INFLUENZA VACCINE  Completed   PNA vac Low Risk Adult  Completed    ----------------------------------------------------------------------------------------------------------------------------------------------------------------------------------------------------------------- Physical Exam Pulse 80    Temp (!) 96.6 F (35.9 C) (Temporal)    Ht 6' (1.829 m)    Wt 174 lb (78.9 kg)    SpO2 97%    BMI 23.60 kg/m   Physical Exam Constitutional:      Appearance: Normal appearance.  HENT:     Head: Normocephalic.  Eyes:     General: No scleral icterus. Neck:     Musculoskeletal: Neck supple.  Cardiovascular:     Rate and Rhythm: Normal rate and regular rhythm.  Pulmonary:      Effort: Pulmonary effort is normal.     Breath sounds: Normal breath sounds.  Musculoskeletal:     Right lower leg: Edema (trace edema) present.     Left lower leg: No edema.  Skin:    General: Skin is warm.     Comments: Skin is xerotic appearing with some excoriated areas to bilateral flanks and legs.    Neurological:     General: No focal deficit present.     Mental Status: He is alert.  Psychiatric:        Mood and Affect: Mood normal.        Behavior: Behavior normal.    MoCA: 24/30 with points lost in Visuospatial Number to letter pattern, clock hands, and delayed recall.  ------------------------------------------------------------------------------------------------------------------------------------------------------------------------------------------------------------------- Assessment and Plan  Renal failure -Update renal function today.     MCI (mild cognitive impairment) MoCA with score of 24/30 today.  Stable at this time. Update TSH levels with itching and xerotic skin but I think his cognitive decline is age/vascular related.    Anxiety Significant anxiety worsened by catheter changes.  Continue lexapro, be sure to take each night.  Will add on buspar as well for anxiety.  Can consider augmentation of lexapro with bupropion as well at f/u if still having significant symptoms.    Pruritus Skin is xerotic.  Discussed trying Sarna anti-itch lotion. Check TSH.  Has evidence of venous stasis and mild edema of lower extremities, has had relief with unna boot previously, will reorder this.  Instructed to apply triamcinolone prior to application of unna boot.   This visit occurred during the SARS-CoV-2 public  health emergency.  Safety protocols were in place, including screening questions prior to the visit, additional usage of staff PPE, and extensive cleaning of exam room while observing appropriate contact time as indicated for disinfecting solutions.

## 2019-01-20 NOTE — Assessment & Plan Note (Signed)
Update renal function today. 

## 2019-01-20 NOTE — Assessment & Plan Note (Signed)
MoCA with score of 24/30 today.  Stable at this time. Update TSH levels with itching and xerotic skin but I think his cognitive decline is age/vascular related.

## 2019-01-20 NOTE — Patient Instructions (Signed)
Try unna boot once weekly as needed for itching/swelling You can apply triamcionolone cream as needed prior to [employer] application.  Try sarna anti-itch cream We'll be in touch with lab results.

## 2019-01-22 NOTE — Progress Notes (Signed)
Please let patient know that kidney function is stable.  His thyroid function is normal.

## 2019-01-28 ENCOUNTER — Telehealth (HOSPITAL_COMMUNITY): Payer: Self-pay

## 2019-01-28 NOTE — Telephone Encounter (Signed)
Received a Request for Medical records from [facility], Dr Franchot Heidelberg. Faxed to [phone].

## 2019-02-05 ENCOUNTER — Telehealth: Payer: Self-pay | Admitting: Family Medicine

## 2019-02-05 NOTE — Telephone Encounter (Signed)
Home Health Verbal Orders - Caller/Agency: Shantel with [facility]  Callback Number: [phone] Requesting OT/PT/Skilled Nursing/Social Work/Speech Therapy: they have faxed over a plan of care order over to the office for the dates of 11/09/18 - 01/09/19.  She would like to know if this was rec'd and could it be faxed back to [phone]

## 2019-02-06 NOTE — Telephone Encounter (Signed)
Contacted number below and receptionist took down message for Gary [name] to re-fax

## 2019-02-06 NOTE — Telephone Encounter (Signed)
Unless it came today, I have not seen it.  Can they re-fax? Thanks

## 2019-02-07 NOTE — Telephone Encounter (Signed)
I am helping remotely from another office, have you received this fax Dr Zigmund Daniel?

## 2019-02-07 NOTE — Telephone Encounter (Signed)
Yes, I have received and given to [facility] to fax back. Thanks!

## 2019-02-10 ENCOUNTER — Telehealth (HOSPITAL_COMMUNITY): Payer: Self-pay | Admitting: *Deleted

## 2019-02-10 NOTE — Telephone Encounter (Signed)
Patients pcp prescribed hydroxyzine for itching. pts son left VM asking if it was ok to take with his other medications and condition.  Sent to [place] for advice

## 2019-02-10 NOTE — Telephone Encounter (Signed)
Patients son advised and verbalized understanding

## 2019-02-10 NOTE — Telephone Encounter (Signed)
Ok to take

## 2019-02-10 NOTE — Telephone Encounter (Signed)
Confirmed w/ Cathe Mons at [facility] that fax was received. Thanks.

## 2019-02-13 ENCOUNTER — Other Ambulatory Visit: Payer: Self-pay

## 2019-02-13 ENCOUNTER — Ambulatory Visit (HOSPITAL_COMMUNITY)
Admission: RE | Admit: 2019-02-13 | Discharge: 2019-02-13 | Disposition: A | Discharge status: Home / Self Care | Payer: Medicare Other | Source: Ambulatory Visit | Attending: Cardiology | Admitting: Cardiology

## 2019-02-13 DIAGNOSIS — I5022 Chronic systolic (congestive) heart failure: Secondary | ICD-10-CM | POA: Diagnosis not present

## 2019-02-14 ENCOUNTER — Telehealth (HOSPITAL_COMMUNITY): Payer: Self-pay

## 2019-02-14 DIAGNOSIS — I5042 Chronic combined systolic (congestive) and diastolic (congestive) heart failure: Secondary | ICD-10-CM

## 2019-02-14 NOTE — Progress Notes (Signed)
Heart Failure TeleHealth Note  Due to national recommendations of social distancing due to [place] 19, Audio/video telehealth visit is felt to be most appropriate for this patient at this time.  See MyChart message from today for patient consent regarding telehealth for [facility]  Date:  02/14/2019   ID:  Gary Lope Sr., DOB 21-Dec-1929, MRN [id number]  Location: Home  Provider location: [place] [facility] Type of Visit: Established patient   PCP:  Luetta Nutting, DO  Cardiologist:  Dr. Aundra Dubin  Chief Complaint: Shortness of breath   History of Present Illness: Gary JOURDAN Sr. is a 85 y.o. male who presents via audio/video conferencing for a telehealth visit today.     he denies symptoms worrisome for COVID 19.   Pateint has a history of CAD s/p CABG in 1995, chronic atrial fibrillation, CKD, and chronic diastolic CHF with prominent RV failure.  Patient has struggled with right-sided failure. He was admitted in 5/16 with abdominal distention, volume overload, and elevated creatinine as high as 6.  Echo showed normal LV EF but RV was moderately dilated with moderate-severe TR, ?Ebsteins anomaly anatomy.  He had a paracentesis for ascites.  V/Q scan was negative for PE. He refused RHC while in the hospital.  He was diuresed and creatinine improved.  Repeat echo was done 6/17.  This showed EF down to 20-25% with regional wall motion abnormalities, moderately dilated RV, only mild TR with PASP 44 mmHg.  Lexiscan Cardiolite in 8/17 showed EF 39% with fixed defects, no ischemia.  Echo in 3/19 with EF 30-35% and prominent RV dysfunction.   Echo in 7/20 showed EF 20-25% with diffuse hypokinesis and prominent dyssynchrony, moderate RV dilation with severely decreased RV systolic function, mild-moderate MR, moderate TR, dilated IVC.   He is stable symptomatically.  Weight has been around 173 lbs.  Minimal edema. Dyspnea walking about 50 yards. No lightheadedness or  falls. No chest pain.  No orthopnea/PND. No melena or BRBPR.  Still doing some woodworking.  Labs (5/16): K 3.6, creatinine 6.15 => 2.47, LFTs normal, LDL 23, HCT 25.2 Labs (07/28/14): K 4.6, Creatinine 2.62, BNP 463 Labs (6/16): creatinine 2.23 => 2.01, K 4.4, BNP 227 Labs (8/16): LFTs normal Labs (12/16): K 4, creatinine 2.11, HCT 40.8 Labs (6/17): K 4.5, creatinine 1.6, LDL 37, HDL 30 Labs (8/17): K 4.2, creatinine 1.77, BNP 176 Labs (1/18): K 4.4, creatinine 1.75 Labs (9/18): K 5, creatinine 1.7 Labs (12/18): LDL 41, HDL 36 Labs (2/19): K 4.7, creatinine 2.72, hgb 11.5 Labs (3/19): K 4.6, creatinine 1.9 Labs (5/19): K 4.2, creatinine 1.89  Labs (10/19): K 4.7, creatinine 1.85, LDL 34 Labs (6/20): hgb 12 Labs (7/20): K 4, creatinine 2.01  Labs (8/20): K 4.6, creatinine 2.0 Labs (11/20): TSH normal, K 4.6, creatinine 1.71  PMH:  1. CAD: MI 1983, CABG 1995.   - Lexiscan Cardiolite (8/17) with EF 39%, fixed inferior/inferoseptal defect and fixed apical septal/apical defect.  2. Chronic systolic failure with prominent RV failure: ?Ischemic cardiomyopathy.  - Echo (5/16) with EF 55-60%, moderate LV hypertrophy, moderately dilated RV, massive biatrial enlargement, moderate-severe TR with some question of Ebstein's anomaly, PA systolic pressure 37 mmHg.  Patient has had ascites likely related to RV failure with h/o paracentesis.  - Echo (6/17) with EF 20-25%, regional wall motion abnormalities, mild MR, moderately dilated RV with normal systolic function, mild TR (improved from prior), PASP 44 mmHg.  - Unable to tolerate spironolactone.  - Echo (3/19)  with EF 30-35% with D-shaped septum and moderately dilated/moderately dysfunctional RV, moderate-severe TR, mild to moderate MR.  PASP 41 mmHg, dilated IVC.  - Echo (7/20): EF 20-25%, diffuse HK with inferior AK and septal-lateral dyssynchrony, moderately dilated RV with severely decreased systolic function, mild-moderate MR, moderate TR, IVC  dilated.  3. CKD stage 3 4. Permanent atrial fibrillation 5. LBBB 6. Hyperlipidemia 7. HTN 8. Complex pancreatic cyst: Followed by GI.  9. Neurogenic bladder: Chronic foley.  10. Anemia of renal disease.  11. Cholecystectomy 12. Appendectomy 13. Low back pain.   Current Outpatient Medications  Medication Sig Dispense Refill  . albuterol (VENTOLIN HFA) 108 (90 Base) MCG/ACT inhaler Inhale 2 puffs into the lungs every 6 (six) hours as needed for wheezing or shortness of breath. 1 Inhaler 3  . atorvastatin (LIPITOR) 20 MG tablet TAKE (1) TABLET BY MOUTH ONCE DAILY 30 tablet 10  . busPIRone (BUSPAR) 7.5 MG tablet Take 1 tablet (7.5 mg total) by mouth 2 (two) times daily. For anxiety 180 tablet 0  . carvedilol (COREG) 12.5 MG tablet TAKE ONE (1) TABLET BY MOUTH TWICE DAILY WITH A MEAL 60 tablet 10  . carvedilol (COREG) 6.25 MG tablet Take 6.25 mg by mouth 2 (two) times daily with a meal.    . clotrimazole-betamethasone (LOTRISONE) cream APPLY TOPICALLY TWICE DAILY 30 g 0  . ELIQUIS 2.5 MG TABS tablet TAKE ONE (1) TABLET BY MOUTH TWICE DAILY 60 tablet 10  . eplerenone (INSPRA) 25 MG tablet Take 1 tablet (25 mg total) by mouth daily. 90 tablet 3  . escitalopram (LEXAPRO) 20 MG tablet TAKE 1 TABLET BY MOUTH DAILY 30 tablet 10  . ferrous sulfate 325 (65 FE) MG tablet Take 325 mg by mouth daily with breakfast.     . fluticasone (CUTIVATE) 0.05 % cream     . folic acid (FOLVITE) 1 MG tablet Take 1 mg by mouth daily.      . Glucos-Chondroit-Hyaluron-MSM (GLUCOSAMINE CHONDROITIN JOINT PO) Take 1 tablet by mouth 2 (two) times daily.    . hydrOXYzine (ATARAX/VISTARIL) 25 MG tablet TK 1 T PO QD PRN  0  . lidocaine (XYLOCAINE) 2 % jelly Place 1 application into the urethra daily as needed (pain). 30 mL 12  . Melatonin 10 MG CAPS Take by mouth.    . Meth-Hyo-M Bl-Na Phos-Ph Sal (URO-MP) 118 MG CAPS TAKE 1-4 CAPSULES BY MOUTH EVERY DAY AS DIRECTED 360 capsule 10  . metolazone (ZAROXOLYN) 2.5 MG tablet  Take 1 tablet (2.5 mg total) by mouth once a week. ON Wednesday MORNING 15 tablet 3  . Multiple Vitamin (MULTIVITAMIN) capsule Take 1 capsule by mouth daily.      Marland Kitchen neomycin-bacitracin-polymyxin (NEOSPORIN) ointment Apply 1 application topically daily as needed for wound care. 15 g 0  . nystatin-triamcinolone (MYCOLOG II) cream     . omeprazole (PRILOSEC) 20 MG capsule TAKE ONE (1) CAPSULE BY MOUTH TWICE DAILY BEFORE MEALS 60 capsule 10  . potassium chloride SA (K-DUR) 20 MEQ tablet Take 1 tablet (20 mEq total) by mouth once a week. TAKE WITH WEEKLY METOLAZONE 52 tablet 0  . Saw Palmetto 450 MG CAPS Take by mouth QID.    Marland Kitchen torsemide (DEMADEX) 20 MG tablet TAKE FOUR (4) TABLETS BY MOUTH TWICE DAILY *CANCEL ALL PREVIOUS ORDERS FOR CURRENT MEDICATION. CHANGE IN DOSE OR PILL SIZE* 240 tablet 10  . triamcinolone cream (KENALOG) 0.1 % Apply to legs prior to unna boot 450 g 1  . Wound Dressings (UNNA-FLEX ELASTIC  UNNA BOOT) MISC Apply weekly as needed 12 each 0   No current facility-administered medications for this encounter.    Allergies:   Amoxicillin, Diltiazem hcl, Hydrocodone-acetaminophen, Niacin, Niacin-lovastatin er, Paroxetine, Propoxyphene n-acetaminophen, Pseudoephedrine, Spironolactone, Sulfonamide derivatives, and Tolterodine tartrate   Social History:  The patient  reports that he has quit smoking. He has never used smokeless tobacco. He reports that he does not drink alcohol or use drugs.   Family History:  The patient's family history includes Breast cancer in his sister; Cancer in his mother; Heart attack in his father.   ROS:  Please see the history of present illness.   All other systems are personally reviewed and negative.   Exam:   Exam:  (Video/Tele Health Call; Exam is subjective and or/visual.) General:  Speaks in full sentences. No resp difficulty. Lungs: Normal respiratory effort with conversation.  Abdomen: Non-distended per patient report Extremities: Pt denies  edema. Neuro: Alert & oriented x 3.   Recent Labs: 11/14/2018: Hemoglobin 13.0; Platelets 150 01/20/2019: ALT 10; BUN 52; Creatinine, Ser 1.71; Potassium 4.6; Sodium 140; TSH 2.03  Personally reviewed   Wt Readings from Last 3 Encounters:  01/20/19 78.9 kg (174 lb)  11/14/18 78.9 kg (174 lb)  11/07/18 78.9 kg (174 lb)      ASSESSMENT AND PLAN:  1. Atrial fibrillation: Permanent.  Dilated atria on echo, unlikely to stay in NSR if DCCV were to be attempted. He is on apixaban 2.5 bid.  HR is controlled.   2. Chronic systolic CHF with prominent RV failure:  In the past, echo had shown RV dysfunction but normal LV systolic function.  Echo in 6/17, however, showed EF 20-25% with wall motion abnormalities and moderately dilated RV.  Concern for ischemic cardiomyopathy with worsening of coronary disease, but he has had no event consistent with MI (chest, arm or neck pain) and his symptoms have been very stable.  Lexiscan Cardiolite in 8/17 showed only fixed defects, no ischemia.  No evidence that he has tachycardia-mediated cardiomyopathy (rate has been controlled).  Echo in 3/19 showed EF 30-35%, D-shaped interventricular septum, dysfunctional RV, and moderate-severe TR.  Echo in 7/20 with EF 20-25%, dyssynchrony, moderated dilated RV with severe systolic dysfunction, moderate TR.  CHF has been slowly worsening with a steadily increasing diuretic requirement.  I am limited in medication management due to low BP and elevated creatinine. Weight is stable today, NYHA class II.  - Keep torsemide at 80 mg bid.  Will get BMET.  - Continue metolazone 2.5 mg once weekly with 20 mEq KCl on metolazone days.  - Continue Coreg 12.5 mg bid. - Continue eplerenone (did not tolerate spironolactone).  - Off lisinopril with rise in creatinine in the past to 2.7.  - He has a wide LBBB (>180 msec) with dyssynchrony on echo.  CRT could be helpful for him.  He is of advanced age, but outside of his CHF he is quite  functional.  As above, CHF has been slowly worsening and I am limited in pushing more aggressive medical management.  He would like to talk to EP about this.  The one major concern other than his age is his chronic indwelling foley for neurogenic bladder, but he has not had frequent UTIs. When we last discussed this, he wanted to hold off on device.   3. Tricuspid regurgitation: Moderate last echo with RV failure. 4. Ascites: Suspect this has been due hepatic congestion from RV failure.  Now resolved.  5. CKD: Stage 3.  HD would be difficult with RV failure.   - Arrange for BMET.   6. CAD: s/p CABG.  No ischemic-type chest pain.  Continue statin.  No ASA with use of warfarin and stable CAD.  Lexiscan Cardiolite in 8/17 showed areas of infarction with no ischemia, given CKD would hold off on cardiac cath in absence of ischemia.  I will arrange for lipids.   COVID screen The patient does not have any symptoms that suggest any further testing/ screening at this time.  Social distancing reinforced today.  Patient Risk: After full review of this patients clinical status, I feel that they are at moderate risk for cardiac decompensation at this time.  Relevant cardiac medications were reviewed at length with the patient today. The patient does not have concerns regarding their medications at this time.   Recommended follow-up:  3 months.  Today, I have spent 16 minutes with the patient with telehealth technology discussing the above issues .    Signed, [name], MD  02/14/2019  [facility] [address] [facility] [place] [address] [phone] (office) [phone] (fax)

## 2019-02-14 NOTE — Telephone Encounter (Signed)
Orders Placed This Encounter  Procedures  . Ambulatory referral to Home Health    Referral Priority:   Routine    Referral Type:   Home Health Care    Referral Reason:   Specialty Services Required    Referred to Provider:   [facility]    Requested Specialty:   [place]    Number of Visits Requested:   1

## 2019-02-14 NOTE — Progress Notes (Signed)
Orders per Dr. [name]  -follow up in 6months (scheduled for 05/19/19) -labs to be drawn by [facility]: lipids,bmet   Spoke with [facility] and gave VO to have labs drawn. Will fax over orders along with notes when complete.   Spoke with patients son [name] and reviewed all directions with him. He was agreeable with plan.

## 2019-02-25 ENCOUNTER — Other Ambulatory Visit (HOSPITAL_COMMUNITY): Payer: Self-pay

## 2019-03-06 ENCOUNTER — Other Ambulatory Visit: Payer: Self-pay

## 2019-03-06 ENCOUNTER — Telehealth: Payer: Self-pay | Admitting: Family Medicine

## 2019-03-06 ENCOUNTER — Other Ambulatory Visit: Payer: Self-pay | Admitting: Family Medicine

## 2019-03-06 MED ORDER — UNNA-FLEX ELASTIC UNNA BOOT EX MISC: CUTANEOUS | 3 refills | Status: DC

## 2019-03-06 NOTE — Telephone Encounter (Signed)
Patient is calling and wanted to speak to someone regarding a prescription that was sent to in the pharmacy. CB is [phone]

## 2019-03-20 ENCOUNTER — Other Ambulatory Visit: Payer: Self-pay | Admitting: Family Medicine

## 2019-03-31 ENCOUNTER — Other Ambulatory Visit: Payer: Self-pay | Admitting: Family Medicine

## 2019-03-31 NOTE — Telephone Encounter (Signed)
Last fill 03/06/19  #[id number]/10

## 2019-04-03 ENCOUNTER — Encounter: Payer: Self-pay | Admitting: Family Medicine

## 2019-04-03 ENCOUNTER — Ambulatory Visit (INDEPENDENT_AMBULATORY_CARE_PROVIDER_SITE_OTHER): Payer: Medicare PPO | Admitting: Family Medicine

## 2019-04-03 ENCOUNTER — Other Ambulatory Visit: Payer: Self-pay

## 2019-04-03 VITALS — BP 127/63 | HR 73 | Ht 72.0 in | Wt 173.0 lb

## 2019-04-03 DIAGNOSIS — R399 Unspecified symptoms and signs involving the genitourinary system: Secondary | ICD-10-CM | POA: Diagnosis not present

## 2019-04-03 DIAGNOSIS — N319 Neuromuscular dysfunction of bladder, unspecified: Secondary | ICD-10-CM

## 2019-04-03 DIAGNOSIS — Z978 Presence of other specified devices: Secondary | ICD-10-CM | POA: Diagnosis not present

## 2019-04-03 DIAGNOSIS — Z87440 Personal history of urinary (tract) infections: Secondary | ICD-10-CM | POA: Diagnosis not present

## 2019-04-03 MED ORDER — LEVOFLOXACIN 500 MG PO TABS: 500.0000 mg | ORAL_TABLET | Freq: Every day | ORAL | 0 refills | Status: AC

## 2019-04-03 NOTE — Progress Notes (Signed)
Virtual Visit via Video Note  I connected with Gary Lope Sr. on 04/03/19 at 10:30 AM EST by a video enabled telemedicine application and verified that I am speaking with the correct person using two identifiers. Location patient: home Location provider: work or home office Persons participating in the virtual visit: patient, patient's DIL, provider  I discussed the limitations of evaluation and management by telemedicine and the availability of in person appointments. The patient expressed understanding and agreed to proceed.  Chief Complaint  Patient presents with  . Urinary Tract Infection    Pt c/o  he had a catheter change 03/25/19.  Pt has nauseaa, lack of appetite and blood in urine, x 7 days.  Pt not feeling good and lack of energy.  Pt has a hx of UTI's.     HPI: Gary [name]. is a 86 y.o. male who was previously a patient of Dr. Zigmund Daniel who complains of nausea, decreased appetite, gross hematuria x 1 week. He also endorses fatigue. Denies fever, chills.  Pt has a h/o UTIs. He has a chronic indwelling foley catheter and this was changed on 03/25/19. He has catheter changed every 60 days at urology office. Pt states this is very painful and he "never has a good experience". He was Rx'd pain med in the past - ? norco - and wonders about a higher dose or different med. He had UTI in 07/2018, culture grew out Klebsiella.  Past Medical History:  Diagnosis Date  . Acute on chronic systolic and diastolic heart failure, NYHA class 4 ([place]) 07/14/2014  . Acute on chronic systolic and diastolic heart failure, NYHA class 4 ([place]) 07/14/2014  . Anemia   . ANEMIA NOS 09/10/2006   Annotation: reportedly diagnosed with anemia in 2007, had a colonoscopy,  radiographic scan  for bleeding and a  esophageal evaluation elsewhere.   Workup negative according with the patient Qualifier: Diagnosis of  By: Larose Kells MD, [place] Anxiety   . Arrhythmia    afib  . Arthritis of multiple sites  03/09/2017  . CAD (coronary artery disease) 01/15/2013  . CARDIOMYOPATHY, ISCHEMIC 06/17/2007   Qualifier: Diagnosis of  By: Larose Kells MD, [place] Cellulitis 07/13/2010  . Chronic combined systolic and diastolic heart failure ([place]) 01/15/2013  . Chronic indwelling Foley catheter 01/04/2018  . Chronic systolic heart failure (HCC)    a. prior EF 20-30%;  b. Echo 7/09: EF 35-40%, basal inferoposterior HK, mild AI, mild to moderate MR, moderate to marked LAE, mild to moderate TR, mildly increased PASP, moderate RAE;   c. Echo 01/2012:   mild LVH, EF 50%, Tr AI, mild MR, severe LAE, mod to severe RAE, mod to severe RVE, mod reduced RVSF, mod to severe TR, PASP 46  . CKD (chronic kidney disease), stage III 06/16/2013  . CKD (chronic kidney disease), stage IV ([place]) 07/15/2014  . Coronary artery disease   . Depression 03/09/2017  . Essential hypertension-now hypotensive 09/10/2006   Qualifier: Diagnosis of  By: Larose Kells MD, [place] of prostate - prominent by CT 05/2013 06/16/2013  . GERD (gastroesophageal reflux disease)   . Heart attack ([place])    1983  . Hyperlipidemia   . Hypertension   . LBBB (left bundle branch block) 06/16/2009   Qualifier: Diagnosis of  By: Allean Found, LPN, [place]    . Long-term (current) use of anticoagulants 07/08/2014  . Nausea 01/04/2018  . Neurogenic bladder    has  had bladder catheter changes at the urology office every 60 d  . Neurogenic bladder disorder 03/09/2017  . Other malaise and fatigue 07/01/2012  . Pancreatic lesion 06/16/2013  . Permanent atrial fibrillation ([place]) 10/04/2006   Qualifier: Diagnosis of  By: Linna Darner MD, [place]    . RAD (reactive airway disease) with wheezing, mild intermittent, uncomplicated [id number]  . Recurrent UTI 04/18/2011  . Renal failure   . Right heart failure ([place]) 06/16/2013  . URINARY INCONTINENCE 09/10/2006   Annotation: following two episodes of urinary retention.  No history of  previous surgeries Qualifier: Diagnosis of  By: Larose Kells MD, [place].    . Valvular heart disease - moderate MR, mild AI, severe TR 06/16/2013  . Warfarin anticoagulation     Past Surgical History:  Procedure Laterality Date  . APPENDECTOMY    . CHOLECYSTECTOMY     1971  . CORONARY ARTERY BYPASS GRAFT     1995    Family History  Problem Relation Age of Onset  . Cancer Mother   . Heart attack Father        deceased at 41, smoking, ETOH abuse  . Breast cancer Sister     Social History   Tobacco Use  . Smoking status: Former Research scientist (life sciences)  . Smokeless tobacco: Never Used  Substance Use Topics  . Alcohol use: No  . Drug use: No     Current Outpatient Medications:  .  albuterol (VENTOLIN HFA) 108 (90 Base) MCG/ACT inhaler, Inhale 2 puffs into the lungs every 6 (six) hours as needed for wheezing or shortness of breath., Disp: 1 Inhaler, Rfl: 3 .  atorvastatin (LIPITOR) 20 MG tablet, TAKE (1) TABLET BY MOUTH ONCE DAILY, Disp: 30 tablet, Rfl: 10 .  carvedilol (COREG) 6.25 MG tablet, Take 6.25 mg by mouth 2 (two) times daily with a meal., Disp: , Rfl:  .  clotrimazole-betamethasone (LOTRISONE) cream, APPLY TOPICALLY TWICE DAILY, Disp: 30 g, Rfl: 0 .  ELIQUIS 2.5 MG TABS tablet, TAKE ONE (1) TABLET BY MOUTH TWICE DAILY, Disp: 60 tablet, Rfl: 10 .  eplerenone (INSPRA) 25 MG tablet, Take 1 tablet (25 mg total) by mouth daily., Disp: 90 tablet, Rfl: 3 .  escitalopram (LEXAPRO) 20 MG tablet, TAKE 1 TABLET BY MOUTH DAILY, Disp: 30 tablet, Rfl: 10 .  ferrous sulfate 325 (65 FE) MG tablet, Take 325 mg by mouth daily with breakfast. , Disp: , Rfl:  .  fluticasone (CUTIVATE) 0.05 % cream, , Disp: , Rfl:  .  folic acid (FOLVITE) 1 MG tablet, Take 1 mg by mouth daily.  , Disp: , Rfl:  .  Glucos-Chondroit-Hyaluron-MSM (GLUCOSAMINE CHONDROITIN JOINT PO), Take 1 tablet by mouth 2 (two) times daily., Disp: , Rfl:  .  hydrOXYzine (ATARAX/VISTARIL) 25 MG tablet, TK 1 T PO QD PRN, Disp: , Rfl: 0 .  lidocaine (XYLOCAINE) 2 % jelly, Place 1 application into the urethra daily as  needed (pain)., Disp: 30 mL, Rfl: 12 .  Melatonin 10 MG CAPS, Take by mouth., Disp: , Rfl:  .  metolazone (ZAROXOLYN) 2.5 MG tablet, Take 1 tablet (2.5 mg total) by mouth once a week. ON Wednesday MORNING, Disp: 15 tablet, Rfl: 3 .  Multiple Vitamin (MULTIVITAMIN) capsule, Take 1 capsule by mouth daily.  , Disp: , Rfl:  .  neomycin-bacitracin-polymyxin (NEOSPORIN) ointment, Apply 1 application topically daily as needed for wound care., Disp: 15 g, Rfl: 0 .  nystatin-triamcinolone (MYCOLOG II) cream, , Disp: , Rfl:  .  omeprazole (PRILOSEC) 20 MG  capsule, TAKE ONE (1) CAPSULE BY MOUTH TWICE DAILY BEFORE MEALS, Disp: 60 capsule, Rfl: 10 .  potassium chloride SA (K-DUR) 20 MEQ tablet, Take 1 tablet (20 mEq total) by mouth once a week. TAKE WITH WEEKLY METOLAZONE, Disp: 52 tablet, Rfl: 0 .  Saw Palmetto 450 MG CAPS, Take by mouth QID., Disp: , Rfl:  .  torsemide (DEMADEX) 20 MG tablet, TAKE FOUR (4) TABLETS BY MOUTH TWICE DAILY *CANCEL ALL PREVIOUS ORDERS FOR CURRENT MEDICATION. CHANGE IN DOSE OR PILL SIZE*, Disp: 240 tablet, Rfl: 10 .  triamcinolone cream (KENALOG) 0.1 %, APPLY TO AFFECTED AREA OF THE LEGS PRIOR TO UNNA BOOT, Disp: 454 g, Rfl: 10 .  Wound Dressings (UNNA-FLEX ELASTIC UNNA BOOT) MISC, APPLY TO AFFECTED AREA WEEKLY AS NEEDED, Disp: 12 each, Rfl: 0 .  busPIRone (BUSPAR) 7.5 MG tablet, Take 1 tablet (7.5 mg total) by mouth 2 (two) times daily. For anxiety (Patient not taking: Reported on 04/03/2019), Disp: 180 tablet, Rfl: 0 .  carvedilol (COREG) 12.5 MG tablet, TAKE ONE (1) TABLET BY MOUTH TWICE DAILY WITH A MEAL (Patient not taking: Reported on 04/03/2019), Disp: 60 tablet, Rfl: 10 .  Meth-Hyo-M Bl-Na Phos-Ph Sal (URO-MP) 118 MG CAPS, TAKE 1-4 CAPSULES BY MOUTH EVERY DAY AS DIRECTED, Disp: 360 capsule, Rfl: 10  Allergies  Allergen Reactions  . Amoxicillin     REACTION: ha/nausea  . Diltiazem Hcl     REACTION: low bp  . Hydrocodone-Acetaminophen     REACTION: ha  . Niacin      REACTION: ha/nausea  . Niacin-Lovastatin Er     REACTION: ha/nausea  . Paroxetine     REACTION: ha/disoriented  . Propoxyphene N-Acetaminophen     REACTION: headache  . Pseudoephedrine     REACTION: stopped urine flow  . Spironolactone Itching  . Sulfonamide Derivatives     REACTION: ha/nausea  . Tolterodine Tartrate     REACTION: bladder stopped      ROS: See pertinent positives and negatives per HPI.   EXAM:  VITALS per patient if applicable: BP 123XX123 (BP Location: Left Arm, Patient Position: Sitting, Cuff Size: Normal)   Pulse 73   Ht 6' (1.829 m)   Wt 173 lb (78.5 kg)   BMI 23.46 kg/m    GENERAL: alert, oriented, appears well and in no acute distress  HEENT: atraumatic, conjunctiva clear, no obvious abnormalities on inspection of external nose and ears  NECK: normal movements of the head and neck  LUNGS: on inspection no signs of respiratory distress, breathing rate appears normal, no obvious gross SOB, gasping or wheezing, no conversational dyspnea  CV: no obvious cyanosis  PSYCH/NEURO: pleasant and cooperative, speech and thought processing grossly intact   ASSESSMENT AND PLAN:  1. UTI symptoms 2. History of recurrent UTIs 3. Neurogenic bladder disorder 4. Chronic indwelling Foley catheter Rx: - levaquin 500mg  daily x 7 days - pt has abx allergies and pt also states keflex and cipro have been ineffective in the past - f/u if symptoms worsen or do not improve in 5-7 days - will need OV at that time and UA, urine cx - pt requesting increase in pain med he takes prior to catheter change but I do not see a recent Rx in chart. He will call office with Rx info. I also recommended pt discuss with urologist   Discussed plan and reviewed medications with patient, including risks, benefits, and potential side effects. Pt expressed understand. All questions answered.  I discussed the assessment and  treatment plan with the patient. The patient was provided an  opportunity to ask questions and all were answered. The patient agreed with the plan and demonstrated an understanding of the instructions.   The patient was advised to call back or seek an in-person evaluation if the symptoms worsen or if the condition fails to improve as anticipated.   Letta Median, DO

## 2019-04-03 NOTE — Telephone Encounter (Signed)
He is seeing Dr. Loletha Grayer virtually today.

## 2019-04-03 NOTE — Patient Instructions (Signed)
COVID-19 Vaccine Information can be found at: [email or web address] For questions related to vaccine distribution or appointments, please email [email or web address] or call [phone].    

## 2019-04-09 ENCOUNTER — Ambulatory Visit: Payer: Medicare PPO | Admitting: Family Medicine

## 2019-04-18 ENCOUNTER — Ambulatory Visit: Payer: Medicare PPO | Admitting: Family Medicine

## 2019-04-22 ENCOUNTER — Other Ambulatory Visit: Payer: Self-pay

## 2019-04-23 ENCOUNTER — Ambulatory Visit (INDEPENDENT_AMBULATORY_CARE_PROVIDER_SITE_OTHER): Payer: Medicare PPO | Admitting: Family Medicine

## 2019-04-23 ENCOUNTER — Encounter: Payer: Self-pay | Admitting: Family Medicine

## 2019-04-23 VITALS — BP 110/60 | HR 88 | Temp 97.1°F | Ht 72.0 in | Wt 175.8 lb

## 2019-04-23 DIAGNOSIS — R195 Other fecal abnormalities: Secondary | ICD-10-CM

## 2019-04-23 DIAGNOSIS — N319 Neuromuscular dysfunction of bladder, unspecified: Secondary | ICD-10-CM

## 2019-04-23 DIAGNOSIS — N184 Chronic kidney disease, stage 4 (severe): Secondary | ICD-10-CM

## 2019-04-23 DIAGNOSIS — Z87440 Personal history of urinary (tract) infections: Secondary | ICD-10-CM | POA: Diagnosis not present

## 2019-04-23 DIAGNOSIS — Z978 Presence of other specified devices: Secondary | ICD-10-CM

## 2019-04-23 DIAGNOSIS — R143 Flatulence: Secondary | ICD-10-CM

## 2019-04-23 DIAGNOSIS — L853 Xerosis cutis: Secondary | ICD-10-CM

## 2019-04-23 LAB — CBC
HCT: 38.6 % — ABNORMAL LOW (ref 39.0–52.0)
Hemoglobin: 12.5 g/dL — ABNORMAL LOW (ref 13.0–17.0)
MCHC: 32.3 g/dL (ref 30.0–36.0)
MCV: 89.8 fl (ref 78.0–100.0)
Platelets: 167 10*3/uL (ref 150.0–400.0)
RBC: 4.3 Mil/uL (ref 4.22–5.81)
RDW: 15.5 % (ref 11.5–15.5)
WBC: 4 10*3/uL (ref 4.0–10.5)

## 2019-04-23 LAB — COMPREHENSIVE METABOLIC PANEL
ALT: 10 U/L (ref 0–53)
AST: 19 U/L (ref 0–37)
Albumin: 4 g/dL (ref 3.5–5.2)
Alkaline Phosphatase: 55 U/L (ref 39–117)
BUN: 53 mg/dL — ABNORMAL HIGH (ref 6–23)
CO2: 38 mEq/L — ABNORMAL HIGH (ref 19–32)
Calcium: 10.6 mg/dL — ABNORMAL HIGH (ref 8.4–10.5)
Chloride: 96 mEq/L (ref 96–112)
Creatinine, Ser: 1.79 mg/dL — ABNORMAL HIGH (ref 0.40–1.50)
GFR: 36.01 mL/min — ABNORMAL LOW (ref >60.00)
Glucose, Bld: 109 mg/dL — ABNORMAL HIGH (ref 70–99)
Potassium: 4.6 mEq/L (ref 3.5–5.1)
Sodium: 139 mEq/L (ref 135–145)
Total Bilirubin: 1.2 mg/dL (ref 0.2–1.2)
Total Protein: 7.3 g/dL (ref 6.0–8.3)

## 2019-04-23 MED ORDER — HYDROCODONE-ACETAMINOPHEN 7.5-325 MG PO TABS: 1.0000 | ORAL_TABLET | Freq: Four times a day (QID) | ORAL | 0 refills | Status: AC | PRN

## 2019-04-23 MED ORDER — NYSTATIN-TRIAMCINOLONE 100000-0.1 UNIT/GM-% EX CREA: TOPICAL_CREAM | Freq: Three times a day (TID) | CUTANEOUS | 3 refills | Status: AC

## 2019-04-23 NOTE — Progress Notes (Signed)
Gary Lope Sr. is a 86 y.o. male  Chief Complaint  Patient presents with  . Follow-up    Pt c/o gas build up at night,  pt is having nausea every morning.  Pt alos c/o ithching in different places on his body.      HPI: Gary SPHAR Sr. is a 86 y.o. male here with his son who presents with multiple issues/concerns: 1. F/u on UTI that pt was recently treated for. He completed 10 day course of levaquin. He has indwelling foley catheter x 10 years. 2. Diffuse dry skin, itching x years. He uses triamcinolone on itchy areas. Denies rash. He takes daily "scalding hot" showers. Does not use moisturizing lotion. 3. Gas and issue with hard stools. Pt has 1 difficult to pass BM each AM but lots of flatus overnight and during the day. He does not drink much water. He has miralax but does not use. Diet is ok - likes candy, microwave meals. He does eat oatmeal and a banana daily in AM. 1 cup of decaf coffee in AM. 4. Pt has chronic foley that is changed q60 days at urology office. He notes this procedure to be very painful and also anxiety-provoking in the week leading up to this appt. Previous PCP Dr. Zigmund Daniel Rx'd norco 5mg  but pt states "this doesn't touch the pain". He has not discussed this concern with urologist.   Past Medical History:  Diagnosis Date  . Acute on chronic systolic and diastolic heart failure, NYHA class 4 ([place]) 07/14/2014  . Acute on chronic systolic and diastolic heart failure, NYHA class 4 ([place]) 07/14/2014  . Anemia   . ANEMIA NOS 09/10/2006   Annotation: reportedly diagnosed with anemia in 2007, had a colonoscopy,  radiographic scan  for bleeding and a  esophageal evaluation elsewhere.   Workup negative according with the patient Qualifier: Diagnosis of  By: Larose Kells MD, [place] Anxiety   . Arrhythmia    afib  . Arthritis of multiple sites 03/09/2017  . CAD (coronary artery disease) 01/15/2013  . CARDIOMYOPATHY, ISCHEMIC 06/17/2007   Qualifier: Diagnosis of  By: Larose Kells MD,  [place] Cellulitis 07/13/2010  . Chronic combined systolic and diastolic heart failure ([place]) 01/15/2013  . Chronic indwelling Foley catheter 01/04/2018  . Chronic systolic heart failure (HCC)    a. prior EF 20-30%;  b. Echo 7/09: EF 35-40%, basal inferoposterior HK, mild AI, mild to moderate MR, moderate to marked LAE, mild to moderate TR, mildly increased PASP, moderate RAE;   c. Echo 01/2012:   mild LVH, EF 50%, Tr AI, mild MR, severe LAE, mod to severe RAE, mod to severe RVE, mod reduced RVSF, mod to severe TR, PASP 46  . CKD (chronic kidney disease), stage III 06/16/2013  . CKD (chronic kidney disease), stage IV ([place]) 07/15/2014  . Coronary artery disease   . Depression 03/09/2017  . Essential hypertension-now hypotensive 09/10/2006   Qualifier: Diagnosis of  By: Larose Kells MD, [place] of prostate - prominent by CT 05/2013 06/16/2013  . GERD (gastroesophageal reflux disease)   . Heart attack ([place])    1983  . Hyperlipidemia   . Hypertension   . LBBB (left bundle branch block) 06/16/2009   Qualifier: Diagnosis of  By: Allean Found, LPN, [place]    . Long-term (current) use of anticoagulants 07/08/2014  . Nausea 01/04/2018  . Neurogenic bladder    has had bladder catheter changes at the urology  office every 60 d  . Neurogenic bladder disorder 03/09/2017  . Other malaise and fatigue 07/01/2012  . Pancreatic lesion 06/16/2013  . Permanent atrial fibrillation ([place]) 10/04/2006   Qualifier: Diagnosis of  By: Linna Darner MD, [place]    . RAD (reactive airway disease) with wheezing, mild intermittent, uncomplicated [id number]  . Recurrent UTI 04/18/2011  . Renal failure   . Right heart failure ([place]) 06/16/2013  . URINARY INCONTINENCE 09/10/2006   Annotation: following two episodes of urinary retention.  No history of  previous surgeries Qualifier: Diagnosis of  By: Larose Kells MD, [place].   . Valvular heart disease - moderate MR, mild AI, severe TR 06/16/2013  . Warfarin anticoagulation     Past Surgical History:   Procedure Laterality Date  . APPENDECTOMY    . CHOLECYSTECTOMY     1971  . CORONARY ARTERY BYPASS GRAFT     1995    Social History   Socioeconomic History  . Marital status: Married    Spouse name: Not on file  . Number of children: Not on file  . Years of education: Not on file  . Highest education level: Not on file  Occupational History  . Occupation: Retired    Fish farm manager: RETIRED    Comment: 1994  Tobacco Use  . Smoking status: Former Research scientist (life sciences)  . Smokeless tobacco: Never Used  Substance and Sexual Activity  . Alcohol use: No  . Drug use: No  . Sexual activity: Not on file  Other Topics Concern  . Not on file  Social History Narrative  . Not on file   Social Determinants of Health   Financial Resource Strain:   . Difficulty of Paying Living Expenses: Not on file  Food Insecurity:   . Worried About Charity fundraiser in the Last Year: Not on file  . Ran Out of Food in the Last Year: Not on file  Transportation Needs:   . Lack of Transportation (Medical): Not on file  . Lack of Transportation (Non-Medical): Not on file  Physical Activity:   . Days of Exercise per Week: Not on file  . Minutes of Exercise per Session: Not on file  Stress:   . Feeling of Stress : Not on file  Social Connections:   . Frequency of Communication with Friends and Family: Not on file  . Frequency of Social Gatherings with Friends and Family: Not on file  . Attends Religious Services: Not on file  . Active Member of Clubs or Organizations: Not on file  . Attends Archivist Meetings: Not on file  . Marital Status: Not on file  Intimate Partner Violence:   . Fear of Current or Ex-Partner: Not on file  . Emotionally Abused: Not on file  . Physically Abused: Not on file  . Sexually Abused: Not on file    Family History  Problem Relation Age of Onset  . Cancer Mother   . Heart attack Father        deceased at 54, smoking, ETOH abuse  . Breast cancer Sister       Immunization History  Administered Date(s) Administered  . Fluad Quad(high Dose 65+) 11/19/2018  . H1N1 03/16/2008  . Influenza Whole 12/12/2006, 11/18/2007, 11/19/2008  . Influenza, High Dose Seasonal PF 11/11/2013, 12/22/2014, 12/03/2015, 11/20/2017  . Influenza,inj,Quad PF,6+ Mos 12/05/2012  . Pneumococcal Conjugate-13 12/10/2013  . Pneumococcal Polysaccharide-23 11/20/2006, 04/18/2011  . Zoster 06/17/2007    Outpatient Encounter Medications as of 04/23/2019  Medication Sig  .  albuterol (VENTOLIN HFA) 108 (90 Base) MCG/ACT inhaler Inhale 2 puffs into the lungs every 6 (six) hours as needed for wheezing or shortness of breath.  Marland Kitchen atorvastatin (LIPITOR) 20 MG tablet TAKE (1) TABLET BY MOUTH ONCE DAILY  . busPIRone (BUSPAR) 7.5 MG tablet Take 1 tablet (7.5 mg total) by mouth 2 (two) times daily. For anxiety  . carvedilol (COREG) 12.5 MG tablet TAKE ONE (1) TABLET BY MOUTH TWICE DAILY WITH A MEAL  . carvedilol (COREG) 6.25 MG tablet Take 6.25 mg by mouth 2 (two) times daily with a meal.  . clotrimazole-betamethasone (LOTRISONE) cream APPLY TOPICALLY TWICE DAILY  . ELIQUIS 2.5 MG TABS tablet TAKE ONE (1) TABLET BY MOUTH TWICE DAILY  . eplerenone (INSPRA) 25 MG tablet Take 1 tablet (25 mg total) by mouth daily.  Marland Kitchen escitalopram (LEXAPRO) 20 MG tablet TAKE 1 TABLET BY MOUTH DAILY  . ferrous sulfate 325 (65 FE) MG tablet Take 325 mg by mouth daily with breakfast.   . fluticasone (CUTIVATE) [id number] % cream   . folic acid (FOLVITE) 1 MG tablet Take 1 mg by mouth daily.    . Glucos-Chondroit-Hyaluron-MSM (GLUCOSAMINE CHONDROITIN JOINT PO) Take 1 tablet by mouth 2 (two) times daily.  . hydrOXYzine (ATARAX/VISTARIL) 25 MG tablet TK 1 T PO QD PRN  . lidocaine (XYLOCAINE) 2 % jelly Place 1 application into the urethra daily as needed (pain).  . Melatonin 10 MG CAPS Take by mouth.  . Meth-Hyo-M Bl-Na Phos-Ph Sal (URO-MP) 118 MG CAPS TAKE 1-4 CAPSULES BY MOUTH EVERY DAY AS DIRECTED  . metolazone  (ZAROXOLYN) 2.5 MG tablet Take 1 tablet (2.5 mg total) by mouth once a week. ON Wednesday MORNING  . Multiple Vitamin (MULTIVITAMIN) capsule Take 1 capsule by mouth daily.    Marland Kitchen neomycin-bacitracin-polymyxin (NEOSPORIN) ointment Apply 1 application topically daily as needed for wound care.  . nystatin-triamcinolone (MYCOLOG II) cream   . omeprazole (PRILOSEC) 20 MG capsule TAKE ONE (1) CAPSULE BY MOUTH TWICE DAILY BEFORE MEALS  . potassium chloride SA (K-DUR) 20 MEQ tablet Take 1 tablet (20 mEq total) by mouth once a week. TAKE WITH WEEKLY METOLAZONE  . Saw [place]metto 450 MG CAPS Take by mouth QID.  Marland Kitchen torsemide (DEMADEX) 20 MG tablet TAKE FOUR (4) TABLETS BY MOUTH TWICE DAILY *CANCEL ALL PREVIOUS ORDERS FOR CURRENT MEDICATION. CHANGE IN DOSE OR PILL SIZE*  . triamcinolone cream (KENALOG) 0.1 % APPLY TO AFFECTED AREA OF THE LEGS PRIOR TO UNNA BOOT  . Wound Dressings (UNNA-FLEX ELASTIC UNNA BOOT) MISC APPLY TO AFFECTED AREA WEEKLY AS NEEDED  . Potassium Chloride ER 20 MEQ TBCR    No facility-administered encounter medications on file as of 04/23/2019.     ROS: Pertinent positives and negatives noted in HPI. Remainder of ROS non-contributory    Allergies  Allergen Reactions  . Amoxicillin     REACTION: ha/nausea  . Diltiazem Hcl     REACTION: low bp  . Hydrocodone-Acetaminophen     REACTION: ha  . Niacin     REACTION: ha/nausea  . Niacin-Lovastatin Er     REACTION: ha/nausea  . Paroxetine     REACTION: ha/disoriented  . Propoxyphene N-Acetaminophen     REACTION: headache  . Pseudoephedrine     REACTION: stopped urine flow  . Spironolactone Itching  . Sulfonamide Derivatives     REACTION: ha/nausea  . Tolterodine Tartrate     REACTION: bladder stopped    BP 110/60 (BP Location: Left Arm, Patient Position: Sitting, Cuff Size: Normal)  Pulse 88   Temp (!) 97.1 F (36.2 C) (Temporal)   Ht 6' (1.829 m)   Wt 175 lb 12.8 oz (79.7 kg)   SpO2 98%   BMI 23.84 kg/m   Physical  Exam  Constitutional: He appears well-developed and well-nourished. No distress.  Cardiovascular: Normal rate and regular rhythm.  Pulmonary/Chest: Effort normal and breath sounds normal. No respiratory distress.  Abdominal: Soft. Bowel sounds are normal. He exhibits no distension. There is no abdominal tenderness. There is no CVA tenderness.  Skin: Skin is dry.  Skin is diffusely dry with areas of excoriation and scabs; no sign of infection     A/P:  1. History of recurrent UTIs - recently treated with levaquin 500mg  daily x 7 days - Urine Culture - CBC  2. Chronic kidney disease, stage IV (severe) (HCC) - Comprehensive metabolic panel - CBC  3. Neurogenic bladder disorder 4. Chronic indwelling Foley catheter Increase: - HYDROcodone-acetaminophen (NORCO) 7.5-325 MG tablet; Take 1 tablet by mouth every 6 (six) hours as needed for moderate pain.  Dispense: 10 tablet; Refill: 0 - increased from 5mg  to 7.5mg  - recommended pt/pts son discuss pain with urologist   5. Dry skin - advised cooler showers and consider bathing every other day, mild soap, moisturize diffusely with lotion (eucerin, aveeno, etc) BID, use aquaphor or vaseline daily to BID to scabs or other areas of irritation - avoid using triamcinolone regularly - increase water intake - if no improvement, could consider hydroxyzine but would use with caution d/t pts age  42. Excessive flatus 7. Hard stool - increase water intake - increase dietary fiber and/or add fiber supplement - miralax 1 cap in 6+oz water daily with goal of 1 soft, easy to pass BM per day - f/u in 4 wks if no/minimal improvement  I spent 45 min with the patient today and greater than 50% was spent in counseling, coordination of care, education   This visit occurred during the SARS-CoV-2 public health emergency.  Safety protocols were in place, including screening questions prior to the visit, additional usage of staff PPE, and extensive cleaning of  exam room while observing appropriate contact time as indicated for disinfecting solutions.

## 2019-04-23 NOTE — Patient Instructions (Addendum)
For your dry skin: Cooler showers  Dove lotion 2x/day vaseline or aquaphor daily on irritated areas You can use benadryl cream topically as needed  Drink water 48-64oz per day  miralax 1/2 capful once per day Goal is 1 soft easy to pass BM per day

## 2019-04-24 ENCOUNTER — Encounter: Payer: Self-pay | Admitting: Family Medicine

## 2019-04-24 LAB — URINE CULTURE
MICRO NUMBER:: 10183677
SPECIMEN QUALITY:: ADEQUATE

## 2019-05-19 ENCOUNTER — Encounter (HOSPITAL_COMMUNITY): Payer: Self-pay | Admitting: Cardiology

## 2019-05-19 ENCOUNTER — Other Ambulatory Visit: Payer: Self-pay

## 2019-05-19 ENCOUNTER — Ambulatory Visit (HOSPITAL_COMMUNITY)
Admission: RE | Admit: 2019-05-19 | Discharge: 2019-05-19 | Disposition: A | Discharge status: Home / Self Care | Payer: Medicare PPO | Source: Ambulatory Visit | Attending: Cardiology | Admitting: Cardiology

## 2019-05-19 VITALS — BP 114/59 | HR 71 | Wt 173.6 lb

## 2019-05-19 DIAGNOSIS — Z885 Allergy status to narcotic agent status: Secondary | ICD-10-CM | POA: Diagnosis not present

## 2019-05-19 DIAGNOSIS — I255 Ischemic cardiomyopathy: Secondary | ICD-10-CM | POA: Insufficient documentation

## 2019-05-19 DIAGNOSIS — N319 Neuromuscular dysfunction of bladder, unspecified: Secondary | ICD-10-CM | POA: Insufficient documentation

## 2019-05-19 DIAGNOSIS — E785 Hyperlipidemia, unspecified: Secondary | ICD-10-CM | POA: Diagnosis not present

## 2019-05-19 DIAGNOSIS — I252 Old myocardial infarction: Secondary | ICD-10-CM | POA: Diagnosis not present

## 2019-05-19 DIAGNOSIS — Z882 Allergy status to sulfonamides status: Secondary | ICD-10-CM | POA: Diagnosis not present

## 2019-05-19 DIAGNOSIS — Z79899 Other long term (current) drug therapy: Secondary | ICD-10-CM | POA: Insufficient documentation

## 2019-05-19 DIAGNOSIS — Z888 Allergy status to other drugs, medicaments and biological substances status: Secondary | ICD-10-CM | POA: Insufficient documentation

## 2019-05-19 DIAGNOSIS — Z87891 Personal history of nicotine dependence: Secondary | ICD-10-CM | POA: Diagnosis not present

## 2019-05-19 DIAGNOSIS — N183 Chronic kidney disease, stage 3 unspecified: Secondary | ICD-10-CM | POA: Diagnosis not present

## 2019-05-19 DIAGNOSIS — Z880 Allergy status to penicillin: Secondary | ICD-10-CM | POA: Diagnosis not present

## 2019-05-19 DIAGNOSIS — I5042 Chronic combined systolic (congestive) and diastolic (congestive) heart failure: Secondary | ICD-10-CM | POA: Diagnosis present

## 2019-05-19 DIAGNOSIS — Z951 Presence of aortocoronary bypass graft: Secondary | ICD-10-CM | POA: Diagnosis not present

## 2019-05-19 DIAGNOSIS — Z803 Family history of malignant neoplasm of breast: Secondary | ICD-10-CM | POA: Insufficient documentation

## 2019-05-19 DIAGNOSIS — Z7901 Long term (current) use of anticoagulants: Secondary | ICD-10-CM | POA: Diagnosis not present

## 2019-05-19 DIAGNOSIS — I4821 Permanent atrial fibrillation: Secondary | ICD-10-CM | POA: Insufficient documentation

## 2019-05-19 DIAGNOSIS — I130 Hypertensive heart and chronic kidney disease with heart failure and stage 1 through stage 4 chronic kidney disease, or unspecified chronic kidney disease: Secondary | ICD-10-CM | POA: Diagnosis not present

## 2019-05-19 DIAGNOSIS — Z8249 Family history of ischemic heart disease and other diseases of the circulatory system: Secondary | ICD-10-CM | POA: Insufficient documentation

## 2019-05-19 DIAGNOSIS — I447 Left bundle-branch block, unspecified: Secondary | ICD-10-CM | POA: Insufficient documentation

## 2019-05-19 DIAGNOSIS — I2510 Atherosclerotic heart disease of native coronary artery without angina pectoris: Secondary | ICD-10-CM | POA: Insufficient documentation

## 2019-05-19 DIAGNOSIS — M545 Low back pain: Secondary | ICD-10-CM | POA: Diagnosis not present

## 2019-05-19 LAB — LIPID PANEL
Cholesterol: 100 mg/dL (ref 0–200)
HDL: 43 mg/dL (ref >40)
LDL Cholesterol: 46 mg/dL (ref 0–99)
Total CHOL/HDL Ratio: 2.3 RATIO
Triglycerides: 54 mg/dL (ref <150)
VLDL: 11 mg/dL (ref 0–40)

## 2019-05-19 LAB — BASIC METABOLIC PANEL
Anion gap: 12 (ref 5–15)
BUN: 63 mg/dL — ABNORMAL HIGH (ref 8–23)
CO2: 35 mmol/L — ABNORMAL HIGH (ref 22–32)
Calcium: 10.1 mg/dL (ref 8.9–10.3)
Chloride: 90 mmol/L — ABNORMAL LOW (ref 98–111)
Creatinine, Ser: 1.91 mg/dL — ABNORMAL HIGH (ref 0.61–1.24)
GFR calc Af Amer: 35 mL/min — ABNORMAL LOW (ref >60)
GFR calc non Af Amer: 31 mL/min — ABNORMAL LOW (ref >60)
Glucose, Bld: 99 mg/dL (ref 70–99)
Potassium: 4 mmol/L (ref 3.5–5.1)
Sodium: 137 mmol/L (ref 135–145)

## 2019-05-19 NOTE — Progress Notes (Signed)
Date:  02/14/2019   ID:  Gary Lope Sr., DOB 01/15/1930, MRN [id number]   Provider location: [facility] Type of Visit: Established patient   PCP:  Gary Nutting, DO  Cardiologist:  Dr. Aundra Dubin   History of Present Illness: Gary STARACE Sr. is a 86 y.o. male who has a history of CAD s/p CABG in 1995, chronic atrial fibrillation, CKD, and chronic diastolic CHF with prominent RV failure.  Patient has struggled with right-sided failure. He was admitted in 5/16 with abdominal distention, volume overload, and elevated creatinine as high as 6.  Echo showed normal LV EF but RV was moderately dilated with moderate-severe TR, ?Ebsteins anomaly anatomy.  He had a paracentesis for ascites.  V/Q scan was negative for PE. He refused RHC while in the hospital.  He was diuresed and creatinine improved.  Repeat echo was done 6/17.  This showed EF down to 20-25% with regional wall motion abnormalities, moderately dilated RV, only mild TR with PASP 44 mmHg.  Lexiscan Cardiolite in 8/17 showed EF 39% with fixed defects, no ischemia.  Echo in 3/19 with EF 30-35% and prominent RV dysfunction.   Echo in 7/20 showed EF 20-25% with diffuse hypokinesis and prominent dyssynchrony, moderate RV dilation with severely decreased RV systolic function, mild-moderate MR, moderate TR, dilated IVC.   He returns for followup of CHF.  BP is low in the office today but he checks it daily at home and SBP is in the 100s-120s.  No lightheadedness or dizziness.  No chest pain. Short of breath with stairs.  Doing ok on flat ground.  He has slept in a recliner for a number of years.  Ongoing low back pain, this limits him more than dyspnea. Not very active.  Weight down 1 lb.   Labs (5/16): K 3.6, creatinine 6.15 => 2.47, LFTs normal, LDL 23, HCT 25.2 Labs (07/28/14): K 4.6, Creatinine 2.62, BNP 463 Labs (6/16): creatinine 2.23 => 2.01, K 4.4, BNP 227 Labs (8/16): LFTs normal Labs (12/16): K 4,  creatinine 2.11, HCT 40.8 Labs (6/17): K 4.5, creatinine 1.6, LDL 37, HDL 30 Labs (8/17): K 4.2, creatinine 1.77, BNP 176 Labs (1/18): K 4.4, creatinine 1.75 Labs (9/18): K 5, creatinine 1.7 Labs (12/18): LDL 41, HDL 36 Labs (2/19): K 4.7, creatinine 2.72, hgb 11.5 Labs (3/19): K 4.6, creatinine 1.9 Labs (5/19): K 4.2, creatinine 1.89  Labs (10/19): K 4.7, creatinine 1.85, LDL 34 Labs (6/20): hgb 12 Labs (7/20): K 4, creatinine 2.01  Labs (8/20): K 4.6, creatinine 2.0 Labs (11/20): TSH normal, K 4.6, creatinine 1.71 Labs (2/21): K 4.6, creatinine 1.79, hgb 12.5  PMH:  1. CAD: MI 1983, CABG 1995.   - Lexiscan Cardiolite (8/17) with EF 39%, fixed inferior/inferoseptal defect and fixed apical septal/apical defect.  2. Chronic systolic failure with prominent RV failure: ?Ischemic cardiomyopathy.  - Echo (5/16) with EF 55-60%, moderate LV hypertrophy, moderately dilated RV, massive biatrial enlargement, moderate-severe TR with some question of Ebstein's anomaly, PA systolic pressure 37 mmHg.  Patient has had ascites likely related to RV failure with h/o paracentesis.  - Echo (6/17) with EF 20-25%, regional wall motion abnormalities, mild MR, moderately dilated RV with normal systolic function, mild TR (improved from prior), PASP 44 mmHg.  - Unable to tolerate spironolactone.  - Echo (3/19) with EF 30-35% with D-shaped septum and moderately dilated/moderately dysfunctional RV, moderate-severe TR, mild to moderate MR.  PASP 41 mmHg, dilated IVC.  - Echo (7/20):  EF 20-25%, diffuse HK with inferior AK and septal-lateral dyssynchrony, moderately dilated RV with severely decreased systolic function, mild-moderate MR, moderate TR, IVC dilated.  3. CKD stage 3 4. Permanent atrial fibrillation 5. LBBB 6. Hyperlipidemia 7. HTN 8. Complex pancreatic cyst: Followed by GI.  9. Neurogenic bladder: Chronic foley.  10. Anemia of renal disease.  11. Cholecystectomy 12. Appendectomy 13. Low back pain.    Current Outpatient Medications  Medication Sig Dispense Refill  . albuterol (VENTOLIN HFA) 108 (90 Base) MCG/ACT inhaler Inhale 2 puffs into the lungs every 6 (six) hours as needed for wheezing or shortness of breath. 1 Inhaler 3  . atorvastatin (LIPITOR) 20 MG tablet TAKE (1) TABLET BY MOUTH ONCE DAILY 30 tablet 10  . busPIRone (BUSPAR) 7.5 MG tablet Take 1 tablet (7.5 mg total) by mouth 2 (two) times daily. For anxiety 180 tablet 0  . carvedilol (COREG) 12.5 MG tablet TAKE ONE (1) TABLET BY MOUTH TWICE DAILY WITH A MEAL 60 tablet 10  . clotrimazole-betamethasone (LOTRISONE) cream APPLY TOPICALLY TWICE DAILY 30 g 0  . ELIQUIS 2.5 MG TABS tablet TAKE ONE (1) TABLET BY MOUTH TWICE DAILY 60 tablet 10  . eplerenone (INSPRA) 25 MG tablet Take 1 tablet (25 mg total) by mouth daily. 90 tablet 3  . escitalopram (LEXAPRO) 20 MG tablet TAKE 1 TABLET BY MOUTH DAILY 30 tablet 10  . ferrous sulfate 325 (65 FE) MG tablet Take 325 mg by mouth daily with breakfast.     . fluticasone (CUTIVATE) 0.05 % cream     . folic acid (FOLVITE) 1 MG tablet Take 1 mg by mouth daily.      . Glucos-Chondroit-Hyaluron-MSM (GLUCOSAMINE CHONDROITIN JOINT PO) Take 1 tablet by mouth 2 (two) times daily.    . hydrALAZINE (APRESOLINE) 10 MG tablet Take 10 mg by mouth daily as needed (itch).    Marland Kitchen HYDROcodone-acetaminophen (NORCO) 7.5-325 MG tablet Take 1 tablet by mouth every 6 (six) hours as needed for moderate pain. 10 tablet 0  . hydrOXYzine (ATARAX/VISTARIL) 25 MG tablet TK 1 T PO QD PRN  0  . lidocaine (XYLOCAINE) 2 % jelly Place 1 application into the urethra daily as needed (pain). 30 mL 12  . Meth-Hyo-M Bl-Na Phos-Ph Sal (URO-MP) 118 MG CAPS TAKE 1-4 CAPSULES BY MOUTH EVERY DAY AS DIRECTED 360 capsule 10  . metolazone (ZAROXOLYN) 2.5 MG tablet Take 1 tablet (2.5 mg total) by mouth once a week. ON Wednesday MORNING 15 tablet 3  . Multiple Vitamin (MULTIVITAMIN) capsule Take 1 capsule by mouth daily.      Marland Kitchen  neomycin-bacitracin-polymyxin (NEOSPORIN) ointment Apply 1 application topically daily as needed for wound care. 15 g 0  . nystatin-triamcinolone (MYCOLOG II) cream Apply topically 3 (three) times daily. 30 g 3  . omeprazole (PRILOSEC) 20 MG capsule TAKE ONE (1) CAPSULE BY MOUTH TWICE DAILY BEFORE MEALS 60 capsule 10  . potassium chloride SA (K-DUR) 20 MEQ tablet Take 1 tablet (20 mEq total) by mouth once a week. TAKE WITH WEEKLY METOLAZONE 52 tablet 0  . torsemide (DEMADEX) 20 MG tablet TAKE FOUR (4) TABLETS BY MOUTH TWICE DAILY *CANCEL ALL PREVIOUS ORDERS FOR CURRENT MEDICATION. CHANGE IN DOSE OR PILL SIZE* 240 tablet 10  . triamcinolone cream (KENALOG) 0.1 % APPLY TO AFFECTED AREA OF THE LEGS PRIOR TO UNNA BOOT 454 g 10  . Wound Dressings (UNNA-FLEX ELASTIC UNNA BOOT) MISC APPLY TO AFFECTED AREA WEEKLY AS NEEDED 12 each 0   No current facility-administered medications for  this encounter.    Allergies:   Amoxicillin, Diltiazem hcl, Hydrocodone-acetaminophen, Niacin, Niacin-lovastatin er, Paroxetine, Propoxyphene n-acetaminophen, Pseudoephedrine, Spironolactone, Sulfonamide derivatives, and Tolterodine tartrate   Social History:  The patient  reports that he has quit smoking. He has never used smokeless tobacco. He reports that he does not drink alcohol or use drugs.   Family History:  The patient's family history includes Breast cancer in his sister; Cancer in his mother; Heart attack in his father.   ROS:  Please see the history of present illness.   All other systems are personally reviewed and negative.   Exam:   BP (!) 114/59   Pulse 71   Wt 78.7 kg (173 lb 9.6 oz)   SpO2 96%   BMI 23.54 kg/m  General: NAD Neck: No JVD, no thyromegaly or thyroid nodule.  Lungs: Mild crackles at bases.  CV: Nondisplaced PMI.  Heart irregular S1/S2, no S3/S4, no murmur.  Trace ankle edema.  No carotid bruit.  Normal pedal pulses.  Abdomen: Soft, nontender, no hepatosplenomegaly, no distention.   Skin: Intact without lesions or rashes.  Neurologic: Alert and oriented x 3.  Psych: Normal affect. Extremities: No clubbing or cyanosis.  HEENT: Normal.     Recent Labs: 01/20/2019: TSH 2.03 04/23/2019: ALT 10; Hemoglobin 12.5; Platelets 167.0 05/19/2019: BUN 63; Creatinine, Ser 1.91; Potassium 4.0; Sodium 137  Personally reviewed   Wt Readings from Last 3 Encounters:  05/19/19 78.7 kg (173 lb 9.6 oz)  04/23/19 79.7 kg (175 lb 12.8 oz)  04/03/19 78.5 kg (173 lb)      ASSESSMENT AND PLAN:  1. Atrial fibrillation: Permanent.  Dilated atria on echo, unlikely to stay in NSR if DCCV were to be attempted. He is on apixaban 2.5 bid.  HR is controlled.   2. Chronic systolic CHF with prominent RV failure:  In the past, echo had shown RV dysfunction but normal LV systolic function.  Echo in 6/17, however, showed EF 20-25% with wall motion abnormalities and moderately dilated RV.  Concern for ischemic cardiomyopathy with worsening of coronary disease, but he has had no event consistent with MI (chest, arm or neck pain) and his symptoms have been very stable.  Lexiscan Cardiolite in 8/17 showed only fixed defects, no ischemia.  No evidence that he has tachycardia-mediated cardiomyopathy (rate has been controlled).  Echo in 3/19 showed EF 30-35%, D-shaped interventricular septum, dysfunctional RV, and moderate-severe TR.  Echo in 7/20 with EF 20-25%, dyssynchrony, moderated dilated RV with severe systolic dysfunction, moderate TR.  He is stable today, not volume overloaded.  NYHA class II-III.  - Keep torsemide at 80 mg bid.  Will get BMET.  - Continue metolazone 2.5 mg once weekly with 20 mEq KCl on metolazone days.  - Continue Coreg 12.5 mg bid. - Continue eplerenone 25 mg daily (did not tolerate spironolactone).  - Off lisinopril with rise in creatinine in the past to 2.7.  - He has a wide LBBB (>180 msec) with dyssynchrony on echo.  CRT could be helpful for him.  He is of advanced age, but  outside of his CHF he is quite functional.  As above, CHF has been slowly worsening and I am limited in pushing more aggressive medical management.  The one major concern other than his age is his chronic indwelling foley for neurogenic bladder, but he has not had frequent UTIs. When we last discussed this, he wanted to hold off on CRT upgrade.   3. Tricuspid regurgitation: Moderate last echo with  RV failure. 4. Ascites: Suspect this has been due hepatic congestion from RV failure.  Now resolved.  5. CKD: Stage 3.  HD would be difficult with RV failure.   - BMET    6. CAD: s/p CABG.  No ischemic-type chest pain.  Continue statin.  No ASA with use of warfarin and stable CAD.  Lexiscan Cardiolite in 8/17 showed areas of infarction with no ischemia, given CKD would hold off on cardiac cath in absence of ischemia.  Check lipids today.   Recommended follow-up:  4 months.  Signed, [name], MD  05/19/2019  [facility] [address] [facility] [place] [address] [phone] (office) [phone] (fax)

## 2019-05-19 NOTE — Patient Instructions (Signed)
No medication changes today!   Labs today We will only contact you if something comes back abnormal or we need to make some changes. Otherwise no news is good news!  Your physician recommends that you schedule a follow-up appointment in: 4 months with Dr Mclean. We will call you to schedule this appointment  Please call office at [phone] option 2 if you have any questions or concerns.   At the [facility], you and your health needs are our priority. As part of our continuing mission to provide you with exceptional heart care, we have created designated Provider Care Teams. These Care Teams include your primary Cardiologist (physician) and Advanced Practice Providers (APPs- Physician Assistants and Nurse Practitioners) who all work together to provide you with the care you need, when you need it.   You may see any of the following providers on your designated Care Team at your next follow up: . Dr Daniel Bensimhon . Dr Dalton McLean . Amy Clegg, NP . Brittainy Simmons, PA . Lauren Kemp, PharmD   Please be sure to bring in all your medications bottles to every appointment.     

## 2019-06-04 ENCOUNTER — Telehealth: Payer: Self-pay | Admitting: Family Medicine

## 2019-06-04 NOTE — Telephone Encounter (Signed)
Left message for patient to schedule Annual Wellness Visit.  Please schedule with Nurse Health Advisor Victoria Britt, RN at [facility]  

## 2019-06-10 DIAGNOSIS — N319 Neuromuscular dysfunction of bladder, unspecified: Secondary | ICD-10-CM | POA: Diagnosis not present

## 2019-06-14 ENCOUNTER — Emergency Department (HOSPITAL_BASED_OUTPATIENT_CLINIC_OR_DEPARTMENT_OTHER)
Admission: EM | Admit: 2019-06-14 | Discharge: 2019-06-14 | Disposition: A | Discharge status: Home / Self Care | Payer: Medicare PPO | Attending: Emergency Medicine | Admitting: Emergency Medicine

## 2019-06-14 ENCOUNTER — Encounter (HOSPITAL_BASED_OUTPATIENT_CLINIC_OR_DEPARTMENT_OTHER): Payer: Self-pay | Admitting: Emergency Medicine

## 2019-06-14 ENCOUNTER — Other Ambulatory Visit: Payer: Self-pay

## 2019-06-14 DIAGNOSIS — Z436 Encounter for attention to other artificial openings of urinary tract: Secondary | ICD-10-CM | POA: Diagnosis present

## 2019-06-14 DIAGNOSIS — I5042 Chronic combined systolic (congestive) and diastolic (congestive) heart failure: Secondary | ICD-10-CM | POA: Insufficient documentation

## 2019-06-14 DIAGNOSIS — Z87891 Personal history of nicotine dependence: Secondary | ICD-10-CM | POA: Insufficient documentation

## 2019-06-14 DIAGNOSIS — N184 Chronic kidney disease, stage 4 (severe): Secondary | ICD-10-CM | POA: Diagnosis not present

## 2019-06-14 DIAGNOSIS — Z79899 Other long term (current) drug therapy: Secondary | ICD-10-CM | POA: Diagnosis not present

## 2019-06-14 DIAGNOSIS — N319 Neuromuscular dysfunction of bladder, unspecified: Secondary | ICD-10-CM | POA: Diagnosis not present

## 2019-06-14 DIAGNOSIS — R338 Other retention of urine: Secondary | ICD-10-CM

## 2019-06-14 DIAGNOSIS — I130 Hypertensive heart and chronic kidney disease with heart failure and stage 1 through stage 4 chronic kidney disease, or unspecified chronic kidney disease: Secondary | ICD-10-CM | POA: Insufficient documentation

## 2019-06-14 DIAGNOSIS — T83198A Other mechanical complication of other urinary devices and implants, initial encounter: Secondary | ICD-10-CM

## 2019-06-14 DIAGNOSIS — R339 Retention of urine, unspecified: Secondary | ICD-10-CM | POA: Insufficient documentation

## 2019-06-14 NOTE — ED Triage Notes (Signed)
Patient presents with complaints of foley catheter being clogged off with blood clots; states attempted to irrigate at home with no improvement. States removed foley pta. States sx onset this evening; complaining of mild abd pain at this time.

## 2019-06-14 NOTE — ED Notes (Addendum)
Patient had foley replaced at [facility] on 4/13; patient uses 18 french foley

## 2019-06-14 NOTE — ED Provider Notes (Signed)
[facility] EMERGENCY DEPARTMENT Provider Note   CSN: [id number] Arrival date & time: 06/14/19  0045   History Chief Complaint  Patient presents with  . Urinary Retention    Clarkton is a 86 y.o. male.  The history is provided by the patient.  He has history of hypertension, hyperlipidemia, chronic systolic and diastolic heart failure, neurogenic bladder with long-term indwelling Foley catheter and comes in tonight because his catheter was occluded with blood clots.  He attempted to irrigate it at home but was unable to do so, and he removed the catheter.  He has not been able to urinate since then.  Past Medical History:  Diagnosis Date  . Acute on chronic systolic and diastolic heart failure, NYHA class 4 ([place]) 07/14/2014  . Acute on chronic systolic and diastolic heart failure, NYHA class 4 ([place]) 07/14/2014  . Anemia   . ANEMIA NOS 09/10/2006   Annotation: reportedly diagnosed with anemia in 2007, had a colonoscopy,  radiographic scan  for bleeding and a  esophageal evaluation elsewhere.   Workup negative according with the patient Qualifier: Diagnosis of  By: Larose Kells MD, [place] Anxiety   . Arrhythmia    afib  . Arthritis of multiple sites 03/09/2017  . CAD (coronary artery disease) 01/15/2013  . CARDIOMYOPATHY, ISCHEMIC 06/17/2007   Qualifier: Diagnosis of  By: Larose Kells MD, [place] Cellulitis 07/13/2010  . Chronic combined systolic and diastolic heart failure ([place]) 01/15/2013  . Chronic indwelling Foley catheter 01/04/2018  . Chronic systolic heart failure (HCC)    a. prior EF 20-30%;  b. Echo 7/09: EF 35-40%, basal inferoposterior HK, mild AI, mild to moderate MR, moderate to marked LAE, mild to moderate TR, mildly increased PASP, moderate RAE;   c. Echo 01/2012:   mild LVH, EF 50%, Tr AI, mild MR, severe LAE, mod to severe RAE, mod to severe RVE, mod reduced RVSF, mod to severe TR, PASP 46  . CKD (chronic kidney disease), stage III 06/16/2013  . CKD (chronic  kidney disease), stage IV ([place]) 07/15/2014  . Coronary artery disease   . Depression 03/09/2017  . Essential hypertension-now hypotensive 09/10/2006   Qualifier: Diagnosis of  By: Larose Kells MD, [place] of prostate - prominent by CT 05/2013 06/16/2013  . GERD (gastroesophageal reflux disease)   . Heart attack ([place])    1983  . Hyperlipidemia   . Hypertension   . LBBB (left bundle branch block) 06/16/2009   Qualifier: Diagnosis of  By: Allean Found, LPN, [place]    . Long-term (current) use of anticoagulants 07/08/2014  . Nausea 01/04/2018  . Neurogenic bladder    has had bladder catheter changes at the urology office every 60 d  . Neurogenic bladder disorder 03/09/2017  . Other malaise and fatigue 07/01/2012  . Pancreatic lesion 06/16/2013  . Permanent atrial fibrillation ([place]) 10/04/2006   Qualifier: Diagnosis of  By: Linna Darner MD, Gwyndolyn Saxon    . RAD (reactive airway disease) with wheezing, mild intermittent, uncomplicated 4/0/1027  . Recurrent UTI 04/18/2011  . Renal failure   . Right heart failure ([place]) 06/16/2013  . URINARY INCONTINENCE 09/10/2006   Annotation: following two episodes of urinary retention.  No history of  previous surgeries Qualifier: Diagnosis of  By: Larose Kells MD, [place].   . Valvular heart disease - moderate MR, mild AI, severe TR 06/16/2013  . Warfarin anticoagulation     Patient Active Problem List   Diagnosis Date  Noted  . MCI (mild cognitive impairment) 01/20/2019  . Pruritus 01/20/2019  . Viral syndrome 11/07/2018  . RAD (reactive airway disease) with wheezing, mild intermittent, uncomplicated 67/34/1937  . Nausea 01/04/2018  . Chronic indwelling Foley catheter 01/04/2018  . Arthritis of multiple sites 03/09/2017  . Neurogenic bladder disorder 03/09/2017  . Depression 03/09/2017  . Anxiety 03/09/2017  . Anemia-transfused 07/15/14 07/18/2014  . CKD (chronic kidney disease), stage IV ([place]) 07/15/2014  . Acute on chronic systolic and diastolic heart failure, NYHA class 4 ([place])  07/14/2014  . Long-term (current) use of anticoagulants 07/08/2014  . Right heart failure ([place]) 06/16/2013  . Valvular heart disease - moderate MR, mild AI, severe TR 06/16/2013  . Pancreatic lesion 06/16/2013  . Finding of prostate - prominent by CT 05/2013 06/16/2013  . Chronic combined systolic and diastolic heart failure ([place]) 01/15/2013  . CAD (coronary artery disease) 01/15/2013  . Other malaise and fatigue 07/01/2012  . Hematuria 04/18/2011  . LBBB (left bundle branch block) 06/16/2009  . CARDIOMYOPATHY, ISCHEMIC 06/17/2007  . Renal failure 03/18/2007  . Adjustment disorder with mixed anxiety and depressed mood 12/12/2006  . Permanent atrial fibrillation ([place]) 10/04/2006  . Hyperlipidemia 09/10/2006  . ANEMIA NOS 09/10/2006  . Essential hypertension-now hypotensive 09/10/2006  . Gastroesophageal reflux disease 09/10/2006  . URINARY INCONTINENCE 09/10/2006    Past Surgical History:  Procedure Laterality Date  . APPENDECTOMY    . CHOLECYSTECTOMY     1971  . CORONARY ARTERY BYPASS GRAFT     1995       Family History  Problem Relation Age of Onset  . Cancer Mother   . Heart attack Father        deceased at 37, smoking, ETOH abuse  . Breast cancer Sister     Social History   Tobacco Use  . Smoking status: Former Research scientist (life sciences)  . Smokeless tobacco: Never Used  Substance Use Topics  . Alcohol use: No  . Drug use: No    Home Medications Prior to Admission medications   Medication Sig Start Date End Date Taking? Authorizing Provider  albuterol (VENTOLIN HFA) 108 (90 Base) MCG/ACT inhaler Inhale 2 puffs into the lungs every 6 (six) hours as needed for wheezing or shortness of breath. 07/31/18   Luetta Nutting, DO  atorvastatin (LIPITOR) 20 MG tablet TAKE (1) TABLET BY MOUTH ONCE DAILY 01/06/19   Larey Dresser, MD  busPIRone (BUSPAR) 7.5 MG tablet Take 1 tablet (7.5 mg total) by mouth 2 (two) times daily. For anxiety 01/20/19   Luetta Nutting, DO  carvedilol (COREG) 12.5  MG tablet TAKE ONE (1) TABLET BY MOUTH TWICE DAILY WITH A MEAL 01/06/19   Larey Dresser, MD  clotrimazole-betamethasone (LOTRISONE) cream APPLY TOPICALLY TWICE DAILY 06/26/18   Luetta Nutting, DO  ELIQUIS 2.5 MG TABS tablet TAKE ONE (1) TABLET BY MOUTH TWICE DAILY 01/06/19   Larey Dresser, MD  eplerenone (INSPRA) 25 MG tablet Take 1 tablet (25 mg total) by mouth daily. 08/20/18   Larey Dresser, MD  escitalopram (LEXAPRO) 20 MG tablet TAKE 1 TABLET BY MOUTH DAILY 08/13/18   Shelda Pal, DO  ferrous sulfate 325 (65 FE) MG tablet Take 325 mg by mouth daily with breakfast.     [provider]  fluticasone (CUTIVATE) 0.05 % cream  12/27/17   [provider]  folic acid (FOLVITE) 1 MG tablet Take 1 mg by mouth daily.      [provider]  Glucos-Chondroit-Hyaluron-MSM (GLUCOSAMINE CHONDROITIN  JOINT PO) Take 1 tablet by mouth 2 (two) times daily.    [provider]  hydrALAZINE (APRESOLINE) 10 MG tablet Take 10 mg by mouth daily as needed (itch).    [provider]  HYDROcodone-acetaminophen (NORCO) 7.5-325 MG tablet Take 1 tablet by mouth every 6 (six) hours as needed for moderate pain. 04/23/19   Cirigliano, Garvin Fila, DO  hydrOXYzine (ATARAX/VISTARIL) 25 MG tablet TK 1 T PO QD PRN 10/10/17   [provider]  lidocaine (XYLOCAINE) 2 % jelly Place 1 application into the urethra daily as needed (pain). 03/09/17   Libby Maw, MD  Meth-Hyo-M Bl-Na Phos-Ph Sal (URO-MP) 118 MG CAPS TAKE 1-4 CAPSULES BY MOUTH EVERY DAY AS DIRECTED 05/15/18   Nani Ravens, Crosby Oyster, DO  metolazone (ZAROXOLYN) 2.5 MG tablet Take 1 tablet (2.5 mg total) by mouth once a week. ON Wednesday MORNING 01/13/19   Larey Dresser, MD  Multiple Vitamin (MULTIVITAMIN) capsule Take 1 capsule by mouth daily.      [provider]  neomycin-bacitracin-polymyxin (NEOSPORIN) ointment Apply 1 application topically daily as needed for wound care. 06/18/18    Luetta Nutting, DO  nystatin-triamcinolone (MYCOLOG II) cream Apply topically 3 (three) times daily. 04/23/19   Cirigliano, Garvin Fila, DO  omeprazole (PRILOSEC) 20 MG capsule TAKE ONE (1) CAPSULE BY MOUTH TWICE DAILY BEFORE MEALS 12/30/18   Wendling, Crosby Oyster, DO  potassium chloride SA (K-DUR) 20 MEQ tablet Take 1 tablet (20 mEq total) by mouth once a week. TAKE WITH WEEKLY METOLAZONE 08/20/18   Larey Dresser, MD  torsemide (DEMADEX) 20 MG tablet TAKE FOUR (4) TABLETS BY MOUTH TWICE DAILY *CANCEL ALL PREVIOUS ORDERS FOR CURRENT MEDICATION. CHANGE IN DOSE OR PILL SIZE* 01/06/19   Larey Dresser, MD  triamcinolone cream (KENALOG) 0.1 % APPLY TO AFFECTED AREA OF THE LEGS PRIOR TO Louretta Parma BOOT 03/06/19   Luetta Nutting, DO  Wound Dressings (UNNA-FLEX ELASTIC UNNA BOOT) MISC APPLY TO AFFECTED AREA WEEKLY AS NEEDED 03/21/19   Luetta Nutting, DO    Allergies    Amoxicillin, Diltiazem hcl, Hydrocodone-acetaminophen, Niacin, Niacin-lovastatin er, Paroxetine, Propoxyphene n-acetaminophen, Pseudoephedrine, Spironolactone, Sulfonamide derivatives, and Tolterodine tartrate  Review of Systems   Review of Systems  All other systems reviewed and are negative.   Physical Exam Updated Vital Signs BP (!) 111/59 (BP Location: Right Arm)   Pulse 60   Temp 97.8 F (36.6 C) (Oral)   Resp 14   Ht 6' (1.829 m)   Wt 78 kg   SpO2 94%   BMI 23.32 kg/m   Physical Exam Vitals and nursing note reviewed.   86 year old male, resting comfortably and in no acute distress. Vital signs are normal. Oxygen saturation is 94%, which is normal. Head is normocephalic and atraumatic. PERRLA, EOMI. Oropharynx is clear. Neck is nontender and supple without adenopathy or JVD. Back is nontender and there is no CVA tenderness. Lungs are clear without rales, wheezes, or rhonchi. Chest is nontender. Heart has regular rate and rhythm without murmur. Abdomen is soft, flat, nontender without masses or hepatosplenomegaly and  peristalsis is normoactive. Extremities have no cyanosis or edema, full range of motion is present. Skin is warm and dry without rash. Neurologic: Mental status is normal, cranial nerves are intact, there are no motor or sensory deficits.  ED Results / Procedures / Treatments    Procedures Procedures   Medications Ordered in ED Medications - No data to display  ED Course  I have reviewed the triage  vital signs and the nursing notes.  MDM Rules/Calculators/A&P Occluded Foley catheter.  New catheter is placed and is draining well.  Old records are reviewed, and he has several prior ED visits for occluded Foley catheter.  He is discharged with instructions to follow-up with his urologist.  Final Clinical Impression(s) / ED Diagnoses Final diagnoses:  Retention of urine due to occlusion of Foley catheter [facility])    Rx / DC Orders ED Discharge Orders    None       Delora Fuel, MD 45/14/60 [phone]

## 2019-06-16 ENCOUNTER — Other Ambulatory Visit: Payer: Self-pay

## 2019-06-16 ENCOUNTER — Telehealth: Payer: Self-pay | Admitting: Family Medicine

## 2019-06-16 NOTE — Telephone Encounter (Signed)
Last OV 04/23/19 Last fill 08/13/18  #30

## 2019-06-16 NOTE — Telephone Encounter (Signed)
Patient is calling and requesting a refill for escitalopram sent [facility] on [address]. CB is [phone]

## 2019-06-17 MED ORDER — ESCITALOPRAM OXALATE 20 MG PO TABS: ORAL_TABLET | ORAL | 3 refills | Status: AC

## 2019-06-17 NOTE — Telephone Encounter (Signed)
Rx refilled as requested.  

## 2019-06-17 NOTE — Telephone Encounter (Signed)
Last OV 04/23/19 Last fill 08/13/18  #30/0 different provider Please advise.

## 2019-06-18 DIAGNOSIS — T83098A Other mechanical complication of other indwelling urethral catheter, initial encounter: Secondary | ICD-10-CM | POA: Diagnosis not present

## 2019-06-19 ENCOUNTER — Other Ambulatory Visit (HOSPITAL_COMMUNITY): Payer: Self-pay

## 2019-06-19 MED ORDER — METOLAZONE 2.5 MG PO TABS: 2.5000 mg | ORAL_TABLET | ORAL | 3 refills | Status: AC

## 2019-07-17 ENCOUNTER — Other Ambulatory Visit: Payer: Self-pay

## 2019-07-18 ENCOUNTER — Encounter: Payer: Self-pay | Admitting: Family Medicine

## 2019-07-18 ENCOUNTER — Ambulatory Visit: Payer: Medicare PPO | Admitting: Family Medicine

## 2019-07-18 VITALS — BP 110/62 | HR 75 | Temp 97.4°F | Ht 72.0 in | Wt 172.4 lb

## 2019-07-18 DIAGNOSIS — G3184 Mild cognitive impairment, so stated: Secondary | ICD-10-CM

## 2019-07-18 DIAGNOSIS — K219 Gastro-esophageal reflux disease without esophagitis: Secondary | ICD-10-CM | POA: Diagnosis not present

## 2019-07-18 DIAGNOSIS — L309 Dermatitis, unspecified: Secondary | ICD-10-CM | POA: Diagnosis not present

## 2019-07-18 DIAGNOSIS — J4520 Mild intermittent asthma, uncomplicated: Secondary | ICD-10-CM

## 2019-07-18 MED ORDER — BETAMETHASONE DIPROPIONATE 0.05 % EX CREA: TOPICAL_CREAM | Freq: Two times a day (BID) | CUTANEOUS | 2 refills | Status: AC

## 2019-07-18 MED ORDER — BETAMETHASONE DIPROPIONATE 0.05 % EX CREA: TOPICAL_CREAM | Freq: Two times a day (BID) | CUTANEOUS | 2 refills | Status: DC

## 2019-07-18 NOTE — Progress Notes (Signed)
Gary Lope Sr. is a 86 y.o. male  Chief Complaint  Patient presents with  . Evaluate Mental Status    Son has noticed a change in patient's mental status and physical state.    HPI: Gary ELENES Sr. is a 86 y.o. male here with his son, Gary [name].  1. Pt is concerned about reflux symptoms. He is on omeprazole 20mg  daily, was previously on 20mg  BID. Son states when pt was eating healthier/less fast food, pts symptoms were better controlled. Pt gets take-out for the majority of his meals. He has reflux symptoms mainly at night.  2. Pt has dry skin, itchy. He notes rash/irritation of B/L groin, not new. Son states pt showers 1x/wk. Pt also wears adult diapers d/t chronic catheter, neurogenic bladder and he feels the material of the diaper irritates his skin. He also has 2 areas on Rt thigh that are irritated. 3. Son wonders about pts cognitive status/decline. Pt can get care through [place] and son wonders if pt can be seen thru [place] for this. 4. Seen by cardio in 04/2019 - stable, no med changes 5. RAD - stable, PRN albuterol  Past Medical History:  Diagnosis Date  . Acute on chronic systolic and diastolic heart failure, NYHA class 4 ([place]) 07/14/2014  . Acute on chronic systolic and diastolic heart failure, NYHA class 4 ([place]) 07/14/2014  . Anemia   . ANEMIA NOS 09/10/2006   Annotation: reportedly diagnosed with anemia in 2007, had a colonoscopy,  radiographic scan  for bleeding and a  esophageal evaluation elsewhere.   Workup negative according with the patient Qualifier: Diagnosis of  By: Larose Kells MD, [place] Anxiety   . Arrhythmia    afib  . Arthritis of multiple sites 03/09/2017  . CAD (coronary artery disease) 01/15/2013  . CARDIOMYOPATHY, ISCHEMIC 06/17/2007   Qualifier: Diagnosis of  By: Larose Kells MD, [place] Cellulitis 07/13/2010  . Chronic combined systolic and diastolic heart failure ([place]) 01/15/2013  . Chronic indwelling Foley catheter 01/04/2018  . Chronic systolic heart  failure (HCC)    a. prior EF 20-30%;  b. Echo 7/09: EF 35-40%, basal inferoposterior HK, mild AI, mild to moderate MR, moderate to marked LAE, mild to moderate TR, mildly increased PASP, moderate RAE;   c. Echo 01/2012:   mild LVH, EF 50%, Tr AI, mild MR, severe LAE, mod to severe RAE, mod to severe RVE, mod reduced RVSF, mod to severe TR, PASP 46  . CKD (chronic kidney disease), stage III 06/16/2013  . CKD (chronic kidney disease), stage IV ([place]) 07/15/2014  . Coronary artery disease   . Depression 03/09/2017  . Essential hypertension-now hypotensive 09/10/2006   Qualifier: Diagnosis of  By: Larose Kells MD, [place] of prostate - prominent by CT 05/2013 06/16/2013  . GERD (gastroesophageal reflux disease)   . Heart attack ([place])    1983  . Hyperlipidemia   . Hypertension   . LBBB (left bundle branch block) 06/16/2009   Qualifier: Diagnosis of  By: Allean Found, LPN, Christine    . Long-term (current) use of anticoagulants 07/08/2014  . Nausea 01/04/2018  . Neurogenic bladder    has had bladder catheter changes at the urology office every 60 d  . Neurogenic bladder disorder 03/09/2017  . Other malaise and fatigue 07/01/2012  . Pancreatic lesion 06/16/2013  . Permanent atrial fibrillation ([place]) 10/04/2006   Qualifier: Diagnosis of  By: Linna Darner MD, Gwyndolyn Saxon    .  RAD (reactive airway disease) with wheezing, mild intermittent, uncomplicated 0/03/7251  . Recurrent UTI 04/18/2011  . Renal failure   . Right heart failure ([place]) 06/16/2013  . URINARY INCONTINENCE 09/10/2006   Annotation: following two episodes of urinary retention.  No history of  previous surgeries Qualifier: Diagnosis of  By: Larose Kells MD, [place].   . Valvular heart disease - moderate MR, mild AI, severe TR 06/16/2013  . Warfarin anticoagulation     Past Surgical History:  Procedure Laterality Date  . APPENDECTOMY    . CHOLECYSTECTOMY     1971  . CORONARY ARTERY BYPASS GRAFT     1995    Social History   Socioeconomic History  . Marital status:  Widowed    Spouse name: Not on file  . Number of children: Not on file  . Years of education: Not on file  . Highest education level: Not on file  Occupational History  . Occupation: Retired    Fish farm manager: RETIRED    Comment: 1994  Tobacco Use  . Smoking status: Former Research scientist (life sciences)  . Smokeless tobacco: Never Used  Substance and Sexual Activity  . Alcohol use: No  . Drug use: No  . Sexual activity: Not on file  Other Topics Concern  . Not on file  Social History Narrative  . Not on file   Social Determinants of Health   Financial Resource Strain:   . Difficulty of Paying Living Expenses:   Food Insecurity:   . Worried About Charity fundraiser in the Last Year:   . Arboriculturist in the Last Year:   Transportation Needs:   . Film/video editor (Medical):   Marland Kitchen Lack of Transportation (Non-Medical):   Physical Activity:   . Days of Exercise per Week:   . Minutes of Exercise per Session:   Stress:   . Feeling of Stress :   Social Connections:   . Frequency of Communication with Friends and Family:   . Frequency of Social Gatherings with Friends and Family:   . Attends Religious Services:   . Active Member of Clubs or Organizations:   . Attends Archivist Meetings:   Marland Kitchen Marital Status:   Intimate Partner Violence:   . Fear of Current or Ex-Partner:   . Emotionally Abused:   Marland Kitchen Physically Abused:   . Sexually Abused:     Family History  Problem Relation Age of Onset  . Cancer Mother   . Heart attack Father        deceased at 52, smoking, ETOH abuse  . Breast cancer Sister      Immunization History  Administered Date(s) Administered  . Fluad Quad(high Dose 65+) 11/19/2018  . H1N1 03/16/2008  . Influenza Whole 12/12/2006, 11/18/2007, 11/19/2008  . Influenza, High Dose Seasonal PF 11/11/2013, 12/22/2014, 12/03/2015, 11/20/2017  . Influenza,inj,Quad PF,6+ Mos 12/05/2012  . Pneumococcal Conjugate-13 12/10/2013  . Pneumococcal Polysaccharide-23 11/20/2006,  04/18/2011  . Zoster 06/17/2007    Outpatient Encounter Medications as of 07/18/2019  Medication Sig  . albuterol (VENTOLIN HFA) 108 (90 Base) MCG/ACT inhaler Inhale 2 puffs into the lungs every 6 (six) hours as needed for wheezing or shortness of breath.  Marland Kitchen atorvastatin (LIPITOR) 20 MG tablet TAKE (1) TABLET BY MOUTH ONCE DAILY  . busPIRone (BUSPAR) 7.5 MG tablet Take 1 tablet (7.5 mg total) by mouth 2 (two) times daily. For anxiety  . carvedilol (COREG) 12.5 MG tablet TAKE ONE (1) TABLET BY MOUTH TWICE DAILY WITH A MEAL  .  clotrimazole-betamethasone (LOTRISONE) cream APPLY TOPICALLY TWICE DAILY  . ELIQUIS 2.5 MG TABS tablet TAKE ONE (1) TABLET BY MOUTH TWICE DAILY  . eplerenone (INSPRA) 25 MG tablet Take 1 tablet (25 mg total) by mouth daily.  Marland Kitchen escitalopram (LEXAPRO) 20 MG tablet TAKE 1 TABLET BY MOUTH DAILY  . ferrous sulfate 325 (65 FE) MG tablet Take 325 mg by mouth daily with breakfast.   . fluticasone (CUTIVATE) 1.61 % cream   . folic acid (FOLVITE) 1 MG tablet Take 1 mg by mouth daily.    . Glucos-Chondroit-Hyaluron-MSM (GLUCOSAMINE CHONDROITIN JOINT PO) Take 1 tablet by mouth 2 (two) times daily.  . hydrALAZINE (APRESOLINE) 10 MG tablet Take 10 mg by mouth daily as needed (itch).  Marland Kitchen HYDROcodone-acetaminophen (NORCO) 7.5-325 MG tablet Take 1 tablet by mouth every 6 (six) hours as needed for moderate pain.  . hydrOXYzine (ATARAX/VISTARIL) 25 MG tablet TK 1 T PO QD PRN  . lidocaine (XYLOCAINE) 2 % jelly Place 1 application into the urethra daily as needed (pain).  . Meth-Hyo-M Bl-Na Phos-Ph Sal (URO-MP) 118 MG CAPS TAKE 1-4 CAPSULES BY MOUTH EVERY DAY AS DIRECTED  . metolazone (ZAROXOLYN) 2.5 MG tablet Take 1 tablet (2.5 mg total) by mouth once a week. ON Wednesday MORNING  . Multiple Vitamin (MULTIVITAMIN) capsule Take 1 capsule by mouth daily.    Marland Kitchen neomycin-bacitracin-polymyxin (NEOSPORIN) ointment Apply 1 application topically daily as needed for wound care.  .  nystatin-triamcinolone (MYCOLOG II) cream Apply topically 3 (three) times daily.  Marland Kitchen omeprazole (PRILOSEC) 20 MG capsule TAKE ONE (1) CAPSULE BY MOUTH TWICE DAILY BEFORE MEALS  . potassium chloride SA (K-DUR) 20 MEQ tablet Take 1 tablet (20 mEq total) by mouth once a week. TAKE WITH WEEKLY METOLAZONE  . torsemide (DEMADEX) 20 MG tablet TAKE FOUR (4) TABLETS BY MOUTH TWICE DAILY *CANCEL ALL PREVIOUS ORDERS FOR CURRENT MEDICATION. CHANGE IN DOSE OR PILL SIZE*  . triamcinolone cream (KENALOG) 0.1 % APPLY TO AFFECTED AREA OF THE LEGS PRIOR TO UNNA BOOT  . Wound Dressings (UNNA-FLEX ELASTIC UNNA BOOT) MISC APPLY TO AFFECTED AREA WEEKLY AS NEEDED  . betamethasone dipropionate 0.05 % cream Apply topically 2 (two) times daily.  . [DISCONTINUED] betamethasone dipropionate 0.05 % cream Apply topically 2 (two) times daily.   No facility-administered encounter medications on file as of 07/18/2019.     ROS: Pertinent positives and negatives noted in HPI. Remainder of ROS non-contributory    Allergies  Allergen Reactions  . Amoxicillin     REACTION: ha/nausea  . Diltiazem Hcl     REACTION: low bp  . Hydrocodone-Acetaminophen     REACTION: ha  . Niacin     REACTION: ha/nausea  . Niacin-Lovastatin Er     REACTION: ha/nausea  . Paroxetine     REACTION: ha/disoriented  . Propoxyphene N-Acetaminophen     REACTION: headache  . Pseudoephedrine     REACTION: stopped urine flow  . Spironolactone Itching  . Sulfonamide Derivatives     REACTION: ha/nausea  . Tolterodine Tartrate     REACTION: bladder stopped    BP 110/62 (BP Location: Right Arm, Patient Position: Sitting, Cuff Size: Normal)   Pulse 75   Temp (!) 97.4 F (36.3 C) (Tympanic)   Ht 6' (1.829 m)   Wt 172 lb 6.4 oz (78.2 kg)   SpO2 95%   BMI 23.38 kg/m    BP Readings from Last 3 Encounters:  07/18/19 110/62  06/14/19 (!) 111/59  05/19/19 (!) 114/59   Pulse  Readings from Last 3 Encounters:  07/18/19 75  06/14/19 60   05/19/19 71   Wt Readings from Last 3 Encounters:  07/18/19 172 lb 6.4 oz (78.2 kg)  06/14/19 171 lb 15.3 oz (78 kg)  05/19/19 173 lb 9.6 oz (78.7 kg)     Physical Exam  Constitutional: He is oriented to person, place, and time. He appears well-developed and well-nourished.  Pulmonary/Chest: No respiratory distress.  Musculoskeletal:        General: No edema.  Neurological: He is alert and oriented to person, place, and time.  Skin: Skin is dry. Rash noted.  Psychiatric: He has a normal mood and affect.     A/P:  1. Dermatitis Rx: - betamethasone dipropionate 0.05 % cream; Apply topically 2 (two) times daily.  Dispense: 45 g; Refill: 2 - mild soap, luke warm showers every other day, dry well, cont with BID moisturizing and then vaseline or aquaphor daily  2. RAD (reactive airway disease) with wheezing, mild intermittent, uncomplicated - stable on PRN albuterol  3. MCI (mild cognitive impairment) - some decline in past year - son will discuss with pts [facility] provider possible neurocognitive testing/assessment  4. Gastroesophageal reflux disease, unspecified whether esophagitis present - symptoms increased when pt eats takeout food, improves with home-cooked meals - son states pt will be having someone come weekly to prepare meals for him - ok to increase omeprazole to 20mg  BID from daily, Rx sent    This visit occurred during the SARS-CoV-2 public health emergency.  Safety protocols were in place, including screening questions prior to the visit, additional usage of staff PPE, and extensive cleaning of exam room while observing appropriate contact time as indicated for disinfecting solutions.

## 2019-07-18 NOTE — Patient Instructions (Signed)
Increase omeprazole to twice per day with breakfast and dinner

## 2019-09-02 ENCOUNTER — Ambulatory Visit: Payer: Medicare PPO | Admitting: Family Medicine

## 2019-09-02 ENCOUNTER — Other Ambulatory Visit: Payer: Self-pay

## 2019-09-02 ENCOUNTER — Encounter: Payer: Self-pay | Admitting: Family Medicine

## 2019-09-02 VITALS — BP 90/64 | HR 71 | Temp 97.4°F | Ht 72.0 in | Wt 178.8 lb

## 2019-09-02 DIAGNOSIS — L299 Pruritus, unspecified: Secondary | ICD-10-CM | POA: Diagnosis not present

## 2019-09-02 DIAGNOSIS — R234 Changes in skin texture: Secondary | ICD-10-CM | POA: Diagnosis not present

## 2019-09-02 DIAGNOSIS — Z978 Presence of other specified devices: Secondary | ICD-10-CM | POA: Diagnosis not present

## 2019-09-02 DIAGNOSIS — L853 Xerosis cutis: Secondary | ICD-10-CM

## 2019-09-02 DIAGNOSIS — N319 Neuromuscular dysfunction of bladder, unspecified: Secondary | ICD-10-CM | POA: Diagnosis not present

## 2019-09-02 MED ORDER — HYDROXYZINE HCL 25 MG PO TABS: 25.0000 mg | ORAL_TABLET | ORAL | 0 refills | Status: AC | PRN

## 2019-09-02 MED ORDER — MUPIROCIN 2 % EX OINT: 1.0000 "application " | TOPICAL_OINTMENT | Freq: Two times a day (BID) | CUTANEOUS | 0 refills | Status: AC

## 2019-09-02 NOTE — Progress Notes (Signed)
Gary TAPANES Sr. is a 86 y.o. male  Chief Complaint  Patient presents with  . bed sores    Pt c/o bed sores locateds on lower back and rectum area x 1 month.  Pt has been using Vasoline, Nystatin cream.  Pt also would like to discuss a rash that started on his legs and to his arms,  they seem to be blisters  . Anxiety    Pt concerned about worrying too much.    HPI: Gary [name]. is a 86 y.o. male here with his son. Pt is moving to independent living in [place] in the next week. He has appt with [facility] set up for 09/08/19 in order to establish care and get all necessary referrals to specialists including derm for his chronic itching, dry skin, rashes.  Pt has a rash on his buttocks and anus x 1+ mo. Itching.  He has chronic itching diffusely as well.   Past Medical History:  Diagnosis Date  . Acute on chronic systolic and diastolic heart failure, NYHA class 4 ([place]) 07/14/2014  . Acute on chronic systolic and diastolic heart failure, NYHA class 4 ([place]) 07/14/2014  . Anemia   . ANEMIA NOS 09/10/2006   Annotation: reportedly diagnosed with anemia in 2007, had a colonoscopy,  radiographic scan  for bleeding and a  esophageal evaluation elsewhere.   Workup negative according with the patient Qualifier: Diagnosis of  By: Larose Kells MD, [place] Anxiety   . Arrhythmia    afib  . Arthritis of multiple sites 03/09/2017  . CAD (coronary artery disease) 01/15/2013  . CARDIOMYOPATHY, ISCHEMIC 06/17/2007   Qualifier: Diagnosis of  By: Larose Kells MD, [place] Cellulitis 07/13/2010  . Chronic combined systolic and diastolic heart failure ([place]) 01/15/2013  . Chronic indwelling Foley catheter 01/04/2018  . Chronic systolic heart failure (HCC)    a. prior EF 20-30%;  b. Echo 7/09: EF 35-40%, basal inferoposterior HK, mild AI, mild to moderate MR, moderate to marked LAE, mild to moderate TR, mildly increased PASP, moderate RAE;   c. Echo 01/2012:   mild LVH, EF 50%, Tr AI, mild MR, severe LAE, mod to  severe RAE, mod to severe RVE, mod reduced RVSF, mod to severe TR, PASP 46  . CKD (chronic kidney disease), stage III 06/16/2013  . CKD (chronic kidney disease), stage IV ([place]) 07/15/2014  . Coronary artery disease   . Depression 03/09/2017  . Essential hypertension-now hypotensive 09/10/2006   Qualifier: Diagnosis of  By: Larose Kells MD, [place] of prostate - prominent by CT 05/2013 06/16/2013  . GERD (gastroesophageal reflux disease)   . Heart attack ([place])    1983  . Hyperlipidemia   . Hypertension   . LBBB (left bundle branch block) 06/16/2009   Qualifier: Diagnosis of  By: Allean Found, LPN, [place]    . Long-term (current) use of anticoagulants 07/08/2014  . Nausea 01/04/2018  . Neurogenic bladder    has had bladder catheter changes at the urology office every 60 d  . Neurogenic bladder disorder 03/09/2017  . Other malaise and fatigue 07/01/2012  . Pancreatic lesion 06/16/2013  . Permanent atrial fibrillation ([place]) 10/04/2006   Qualifier: Diagnosis of  By: Linna Darner MD, [place]olyn Saxon    . RAD (reactive airway disease) with wheezing, mild intermittent, uncomplicated 06/30/84  . Recurrent UTI 04/18/2011  . Renal failure   . Right heart failure ([place]) 06/16/2013  . URINARY INCONTINENCE 09/10/2006  Annotation: following two episodes of urinary retention.  No history of  previous surgeries Qualifier: Diagnosis of  By: Larose Kells MD, [place].   . Valvular heart disease - moderate MR, mild AI, severe TR 06/16/2013  . Warfarin anticoagulation     Past Surgical History:  Procedure Laterality Date  . APPENDECTOMY    . CHOLECYSTECTOMY     1971  . CORONARY ARTERY BYPASS GRAFT     1995    Social History   Socioeconomic History  . Marital status: Widowed    Spouse name: Not on file  . Number of children: Not on file  . Years of education: Not on file  . Highest education level: Not on file  Occupational History  . Occupation: Retired    Fish farm manager: RETIRED    Comment: 1994  Tobacco Use  . Smoking status:  Former Research scientist (life sciences)  . Smokeless tobacco: Never Used  Substance and Sexual Activity  . Alcohol use: No  . Drug use: No  . Sexual activity: Not on file  Other Topics Concern  . Not on file  Social History Narrative  . Not on file   Social Determinants of Health   Financial Resource Strain:   . Difficulty of Paying Living Expenses:   Food Insecurity:   . Worried About Charity fundraiser in the Last Year:   . Arboriculturist in the Last Year:   Transportation Needs:   . Film/video editor (Medical):   Marland Kitchen Lack of Transportation (Non-Medical):   Physical Activity:   . Days of Exercise per Week:   . Minutes of Exercise per Session:   Stress:   . Feeling of Stress :   Social Connections:   . Frequency of Communication with Friends and Family:   . Frequency of Social Gatherings with Friends and Family:   . Attends Religious Services:   . Active Member of Clubs or Organizations:   . Attends Archivist Meetings:   Marland Kitchen Marital Status:   Intimate Partner Violence:   . Fear of Current or Ex-Partner:   . Emotionally Abused:   Marland Kitchen Physically Abused:   . Sexually Abused:     Family History  Problem Relation Age of Onset  . Cancer Mother   . Heart attack Father        deceased at 17, smoking, ETOH abuse  . Breast cancer Sister      Immunization History  Administered Date(s) Administered  . Fluad Quad(high Dose 65+) 11/19/2018  . H1N1 03/16/2008  . Influenza Whole 12/12/2006, 11/18/2007, 11/19/2008  . Influenza, High Dose Seasonal PF 11/11/2013, 12/22/2014, 12/03/2015, 11/20/2017  . Influenza,inj,Quad PF,6+ Mos 12/05/2012  . Pneumococcal Conjugate-13 12/10/2013  . Pneumococcal Polysaccharide-23 11/20/2006, 04/18/2011  . Zoster 06/17/2007    Outpatient Encounter Medications as of 09/02/2019  Medication Sig  . atorvastatin (LIPITOR) 20 MG tablet TAKE (1) TABLET BY MOUTH ONCE DAILY  . betamethasone dipropionate 0.05 % cream Apply topically 2 (two) times daily.  .  busPIRone (BUSPAR) 7.5 MG tablet Take 1 tablet (7.5 mg total) by mouth 2 (two) times daily. For anxiety  . carvedilol (COREG) 12.5 MG tablet TAKE ONE (1) TABLET BY MOUTH TWICE DAILY WITH A MEAL  . clotrimazole-betamethasone (LOTRISONE) cream APPLY TOPICALLY TWICE DAILY  . ELIQUIS 2.5 MG TABS tablet TAKE ONE (1) TABLET BY MOUTH TWICE DAILY  . eplerenone (INSPRA) 25 MG tablet Take 1 tablet (25 mg total) by mouth daily.  Marland Kitchen escitalopram (LEXAPRO) 20 MG tablet TAKE 1  TABLET BY MOUTH DAILY  . ferrous sulfate 325 (65 FE) MG tablet Take 325 mg by mouth daily with breakfast.   . folic acid (FOLVITE) 1 MG tablet Take 1 mg by mouth daily.    . Glucos-Chondroit-Hyaluron-MSM (GLUCOSAMINE CHONDROITIN JOINT PO) Take 1 tablet by mouth 2 (two) times daily.  . hydrALAZINE (APRESOLINE) 10 MG tablet Take 10 mg by mouth daily as needed (itch).  Marland Kitchen HYDROcodone-acetaminophen (NORCO) 7.5-325 MG tablet Take 1 tablet by mouth every 6 (six) hours as needed for moderate pain.  . hydrOXYzine (ATARAX/VISTARIL) 25 MG tablet TK 1 T PO QD PRN  . lidocaine (XYLOCAINE) 2 % jelly Place 1 application into the urethra daily as needed (pain).  . Meth-Hyo-M Bl-Na Phos-Ph Sal (URO-MP) 118 MG CAPS TAKE 1-4 CAPSULES BY MOUTH EVERY DAY AS DIRECTED  . metolazone (ZAROXOLYN) 2.5 MG tablet Take 1 tablet (2.5 mg total) by mouth once a week. ON Wednesday MORNING  . Multiple Vitamin (MULTIVITAMIN) capsule Take 1 capsule by mouth daily.    Marland Kitchen neomycin-bacitracin-polymyxin (NEOSPORIN) ointment Apply 1 application topically daily as needed for wound care.  . nystatin-triamcinolone (MYCOLOG II) cream Apply topically 3 (three) times daily.  Marland Kitchen omeprazole (PRILOSEC) 20 MG capsule TAKE ONE (1) CAPSULE BY MOUTH TWICE DAILY BEFORE MEALS  . potassium chloride SA (K-DUR) 20 MEQ tablet Take 1 tablet (20 mEq total) by mouth once a week. TAKE WITH WEEKLY METOLAZONE  . torsemide (DEMADEX) 20 MG tablet TAKE FOUR (4) TABLETS BY MOUTH TWICE DAILY *CANCEL ALL  PREVIOUS ORDERS FOR CURRENT MEDICATION. CHANGE IN DOSE OR PILL SIZE*  . triamcinolone cream (KENALOG) 0.1 % APPLY TO AFFECTED AREA OF THE LEGS PRIOR TO UNNA BOOT  . Wound Dressings (UNNA-FLEX ELASTIC UNNA BOOT) MISC APPLY TO AFFECTED AREA WEEKLY AS NEEDED  . albuterol (VENTOLIN HFA) 108 (90 Base) MCG/ACT inhaler Inhale 2 puffs into the lungs every 6 (six) hours as needed for wheezing or shortness of breath. (Patient not taking: Reported on 09/02/2019)  . fluticasone (CUTIVATE) 0.05 % cream  (Patient not taking: Reported on 09/02/2019)   No facility-administered encounter medications on file as of 09/02/2019.     ROS: Pertinent positives and negatives noted in HPI. Remainder of ROS non-contributory    Allergies  Allergen Reactions  . Amoxicillin     REACTION: ha/nausea  . Diltiazem Hcl     REACTION: low bp  . Hydrocodone-Acetaminophen     REACTION: ha  . Niacin     REACTION: ha/nausea  . Niacin-Lovastatin Er     REACTION: ha/nausea  . Paroxetine     REACTION: ha/disoriented  . Propoxyphene N-Acetaminophen     REACTION: headache  . Pseudoephedrine     REACTION: stopped urine flow  . Spironolactone Itching  . Sulfonamide Derivatives     REACTION: ha/nausea  . Tolterodine Tartrate     REACTION: bladder stopped    BP 90/64 (BP Location: Left Arm, Patient Position: Sitting, Cuff Size: Normal)   Pulse 71   Temp (!) 97.4 F (36.3 C) (Temporal)   Ht 6' (1.829 m)   Wt 178 lb 12.8 oz (81.1 kg)   BMI 24.25 kg/m   Physical Exam Constitutional:      General: He is not in acute distress.    Appearance: Normal appearance. He is not ill-appearing.  Skin:    General: Skin is dry.     Findings: Abrasion, bruising (scattered ecchymosis minaly on B/L arms) and rash (rash in gluteal folds and around anus, erythematous with few  satelilite lesions, no open sores/wounds/ulcers) present.  Neurological:     Mental Status: He is alert and oriented to person, place, and time.  Psychiatric:         Mood and Affect: Mood normal.        Behavior: Behavior normal.      A/P:  1. Neurogenic bladder disorder 2. Chronic indwelling Foley catheter - due for next catheter change at end of this month. Pt got 10 tabs of norco 7.5mg  tabs in 03/2019 and has only had 1 change since that time. Pt is unsure where rest of Rx is but so will help him look for bottle as he should not need refill at this time  3. Pruritus 4. Dry skin dermatitis - chronic issue, diffuse pruritis Refill: - hydrOXYzine (ATARAX/VISTARIL) 25 MG tablet; Take 1 tablet (25 mg total) by mouth every 4 (four) hours as needed for anxiety or itching.  Dispense: 30 tablet; Refill: 0 - discussed importance of cool showers every 2-3 days, Dove soap, free & clear detergent, moistruize body 2x/day with aveeno, eucerin, cetaphil, use aquaphor 2x/day on dry/rough,itchy patches of skin - Ambulatory referral to Dermatology  5. Thinning of skin Rx: - mupirocin ointment (BACTROBAN) 2 %; Apply 1 application topically 2 (two) times daily.  Dispense: 22 g; Refill: 0 - to use PRN    This visit occurred during the SARS-CoV-2 public health emergency.  Safety protocols were in place, including screening questions prior to the visit, additional usage of staff PPE, and extensive cleaning of exam room while observing appropriate contact time as indicated for disinfecting solutions.

## 2019-09-02 NOTE — Patient Instructions (Signed)
Hold hydroxyzine 25mg  at night x 3 nights to see if it is helpful

## 2019-09-30 DIAGNOSIS — I4821 Permanent atrial fibrillation: Secondary | ICD-10-CM | POA: Diagnosis not present

## 2019-09-30 DIAGNOSIS — I5042 Chronic combined systolic (congestive) and diastolic (congestive) heart failure: Secondary | ICD-10-CM | POA: Diagnosis not present

## 2019-09-30 DIAGNOSIS — I2510 Atherosclerotic heart disease of native coronary artery without angina pectoris: Secondary | ICD-10-CM | POA: Diagnosis not present

## 2019-09-30 DIAGNOSIS — N390 Urinary tract infection, site not specified: Secondary | ICD-10-CM | POA: Diagnosis not present

## 2019-10-10 DIAGNOSIS — I4821 Permanent atrial fibrillation: Secondary | ICD-10-CM | POA: Diagnosis not present

## 2019-10-10 DIAGNOSIS — I252 Old myocardial infarction: Secondary | ICD-10-CM | POA: Diagnosis not present

## 2019-10-10 DIAGNOSIS — I2510 Atherosclerotic heart disease of native coronary artery without angina pectoris: Secondary | ICD-10-CM | POA: Diagnosis not present

## 2019-10-10 DIAGNOSIS — N390 Urinary tract infection, site not specified: Secondary | ICD-10-CM | POA: Diagnosis not present

## 2019-10-10 DIAGNOSIS — S72002A Fracture of unspecified part of neck of left femur, initial encounter for closed fracture: Secondary | ICD-10-CM | POA: Diagnosis not present

## 2019-10-10 DIAGNOSIS — R0902 Hypoxemia: Secondary | ICD-10-CM | POA: Diagnosis not present

## 2019-10-10 DIAGNOSIS — I130 Hypertensive heart and chronic kidney disease with heart failure and stage 1 through stage 4 chronic kidney disease, or unspecified chronic kidney disease: Secondary | ICD-10-CM | POA: Diagnosis not present

## 2019-10-10 DIAGNOSIS — R4020 Unspecified coma: Secondary | ICD-10-CM | POA: Diagnosis not present

## 2019-10-10 DIAGNOSIS — R52 Pain, unspecified: Secondary | ICD-10-CM | POA: Diagnosis not present

## 2019-10-10 DIAGNOSIS — S72052A Unspecified fracture of head of left femur, initial encounter for closed fracture: Secondary | ICD-10-CM | POA: Diagnosis not present

## 2019-10-10 DIAGNOSIS — R918 Other nonspecific abnormal finding of lung field: Secondary | ICD-10-CM | POA: Diagnosis not present

## 2019-10-10 DIAGNOSIS — Z20822 Contact with and (suspected) exposure to covid-19: Secondary | ICD-10-CM | POA: Diagnosis not present

## 2019-10-10 DIAGNOSIS — Z66 Do not resuscitate: Secondary | ICD-10-CM | POA: Diagnosis not present

## 2019-10-10 DIAGNOSIS — I10 Essential (primary) hypertension: Secondary | ICD-10-CM | POA: Diagnosis not present

## 2019-10-10 DIAGNOSIS — M25552 Pain in left hip: Secondary | ICD-10-CM | POA: Diagnosis not present

## 2019-10-10 DIAGNOSIS — I5042 Chronic combined systolic (congestive) and diastolic (congestive) heart failure: Secondary | ICD-10-CM | POA: Diagnosis not present

## 2019-10-11 DIAGNOSIS — Z01810 Encounter for preprocedural cardiovascular examination: Secondary | ICD-10-CM | POA: Diagnosis not present

## 2019-10-11 DIAGNOSIS — Z20822 Contact with and (suspected) exposure to covid-19: Secondary | ICD-10-CM | POA: Diagnosis not present

## 2019-10-11 DIAGNOSIS — I447 Left bundle-branch block, unspecified: Secondary | ICD-10-CM | POA: Diagnosis not present

## 2019-10-11 DIAGNOSIS — I130 Hypertensive heart and chronic kidney disease with heart failure and stage 1 through stage 4 chronic kidney disease, or unspecified chronic kidney disease: Secondary | ICD-10-CM | POA: Diagnosis not present

## 2019-10-11 DIAGNOSIS — S72092A Other fracture of head and neck of left femur, initial encounter for closed fracture: Secondary | ICD-10-CM | POA: Diagnosis not present

## 2019-10-11 DIAGNOSIS — I340 Nonrheumatic mitral (valve) insufficiency: Secondary | ICD-10-CM | POA: Diagnosis not present

## 2019-10-11 DIAGNOSIS — I4821 Permanent atrial fibrillation: Secondary | ICD-10-CM | POA: Diagnosis not present

## 2019-10-11 DIAGNOSIS — M25512 Pain in left shoulder: Secondary | ICD-10-CM | POA: Diagnosis not present

## 2019-10-11 DIAGNOSIS — Z66 Do not resuscitate: Secondary | ICD-10-CM | POA: Diagnosis not present

## 2019-10-11 DIAGNOSIS — I252 Old myocardial infarction: Secondary | ICD-10-CM | POA: Diagnosis not present

## 2019-10-11 DIAGNOSIS — I2510 Atherosclerotic heart disease of native coronary artery without angina pectoris: Secondary | ICD-10-CM | POA: Diagnosis not present

## 2019-10-11 DIAGNOSIS — E7800 Pure hypercholesterolemia, unspecified: Secondary | ICD-10-CM | POA: Diagnosis not present

## 2019-10-11 DIAGNOSIS — R0902 Hypoxemia: Secondary | ICD-10-CM | POA: Diagnosis not present

## 2019-10-11 DIAGNOSIS — R0600 Dyspnea, unspecified: Secondary | ICD-10-CM | POA: Diagnosis not present

## 2019-10-11 DIAGNOSIS — S72002A Fracture of unspecified part of neck of left femur, initial encounter for closed fracture: Secondary | ICD-10-CM | POA: Diagnosis not present

## 2019-10-11 DIAGNOSIS — I482 Chronic atrial fibrillation, unspecified: Secondary | ICD-10-CM | POA: Diagnosis not present

## 2019-10-11 DIAGNOSIS — N390 Urinary tract infection, site not specified: Secondary | ICD-10-CM | POA: Diagnosis not present

## 2019-10-11 DIAGNOSIS — N1832 Chronic kidney disease, stage 3b: Secondary | ICD-10-CM | POA: Diagnosis not present

## 2019-10-11 DIAGNOSIS — I5042 Chronic combined systolic (congestive) and diastolic (congestive) heart failure: Secondary | ICD-10-CM | POA: Diagnosis not present

## 2019-10-11 DIAGNOSIS — Z951 Presence of aortocoronary bypass graft: Secondary | ICD-10-CM | POA: Diagnosis not present

## 2019-10-12 DIAGNOSIS — S72002A Fracture of unspecified part of neck of left femur, initial encounter for closed fracture: Secondary | ICD-10-CM | POA: Diagnosis not present

## 2019-10-12 DIAGNOSIS — I5042 Chronic combined systolic (congestive) and diastolic (congestive) heart failure: Secondary | ICD-10-CM | POA: Diagnosis not present

## 2019-10-12 DIAGNOSIS — S72092A Other fracture of head and neck of left femur, initial encounter for closed fracture: Secondary | ICD-10-CM | POA: Diagnosis not present

## 2019-10-12 DIAGNOSIS — Z20822 Contact with and (suspected) exposure to covid-19: Secondary | ICD-10-CM | POA: Diagnosis not present

## 2019-10-12 DIAGNOSIS — I130 Hypertensive heart and chronic kidney disease with heart failure and stage 1 through stage 4 chronic kidney disease, or unspecified chronic kidney disease: Secondary | ICD-10-CM | POA: Diagnosis not present

## 2019-10-12 DIAGNOSIS — I2510 Atherosclerotic heart disease of native coronary artery without angina pectoris: Secondary | ICD-10-CM | POA: Diagnosis not present

## 2019-10-12 DIAGNOSIS — I252 Old myocardial infarction: Secondary | ICD-10-CM | POA: Diagnosis not present

## 2019-10-12 DIAGNOSIS — M25512 Pain in left shoulder: Secondary | ICD-10-CM | POA: Diagnosis not present

## 2019-10-12 DIAGNOSIS — Z66 Do not resuscitate: Secondary | ICD-10-CM | POA: Diagnosis not present

## 2019-10-12 DIAGNOSIS — I2699 Other pulmonary embolism without acute cor pulmonale: Secondary | ICD-10-CM | POA: Diagnosis not present

## 2019-10-12 DIAGNOSIS — R0902 Hypoxemia: Secondary | ICD-10-CM | POA: Diagnosis not present

## 2019-10-12 DIAGNOSIS — N390 Urinary tract infection, site not specified: Secondary | ICD-10-CM | POA: Diagnosis not present

## 2019-10-12 DIAGNOSIS — R0600 Dyspnea, unspecified: Secondary | ICD-10-CM | POA: Diagnosis not present

## 2019-10-12 DIAGNOSIS — I4821 Permanent atrial fibrillation: Secondary | ICD-10-CM | POA: Diagnosis not present

## 2019-10-13 DIAGNOSIS — I447 Left bundle-branch block, unspecified: Secondary | ICD-10-CM | POA: Diagnosis not present

## 2019-10-13 DIAGNOSIS — N390 Urinary tract infection, site not specified: Secondary | ICD-10-CM | POA: Diagnosis not present

## 2019-10-13 DIAGNOSIS — E7800 Pure hypercholesterolemia, unspecified: Secondary | ICD-10-CM | POA: Diagnosis not present

## 2019-10-13 DIAGNOSIS — Z20822 Contact with and (suspected) exposure to covid-19: Secondary | ICD-10-CM | POA: Diagnosis not present

## 2019-10-13 DIAGNOSIS — R0902 Hypoxemia: Secondary | ICD-10-CM | POA: Diagnosis not present

## 2019-10-13 DIAGNOSIS — I482 Chronic atrial fibrillation, unspecified: Secondary | ICD-10-CM | POA: Diagnosis not present

## 2019-10-13 DIAGNOSIS — I252 Old myocardial infarction: Secondary | ICD-10-CM | POA: Diagnosis not present

## 2019-10-13 DIAGNOSIS — I2510 Atherosclerotic heart disease of native coronary artery without angina pectoris: Secondary | ICD-10-CM | POA: Diagnosis not present

## 2019-10-13 DIAGNOSIS — S72002A Fracture of unspecified part of neck of left femur, initial encounter for closed fracture: Secondary | ICD-10-CM | POA: Diagnosis not present

## 2019-10-13 DIAGNOSIS — N1832 Chronic kidney disease, stage 3b: Secondary | ICD-10-CM | POA: Diagnosis not present

## 2019-10-13 DIAGNOSIS — I4821 Permanent atrial fibrillation: Secondary | ICD-10-CM | POA: Diagnosis not present

## 2019-10-13 DIAGNOSIS — S7292XA Unspecified fracture of left femur, initial encounter for closed fracture: Secondary | ICD-10-CM | POA: Diagnosis not present

## 2019-10-13 DIAGNOSIS — I130 Hypertensive heart and chronic kidney disease with heart failure and stage 1 through stage 4 chronic kidney disease, or unspecified chronic kidney disease: Secondary | ICD-10-CM | POA: Diagnosis not present

## 2019-10-13 DIAGNOSIS — S72092A Other fracture of head and neck of left femur, initial encounter for closed fracture: Secondary | ICD-10-CM | POA: Diagnosis not present

## 2019-10-13 DIAGNOSIS — Z01810 Encounter for preprocedural cardiovascular examination: Secondary | ICD-10-CM | POA: Diagnosis not present

## 2019-10-13 DIAGNOSIS — Z951 Presence of aortocoronary bypass graft: Secondary | ICD-10-CM | POA: Diagnosis not present

## 2019-10-13 DIAGNOSIS — R339 Retention of urine, unspecified: Secondary | ICD-10-CM | POA: Diagnosis not present

## 2019-10-13 DIAGNOSIS — Z66 Do not resuscitate: Secondary | ICD-10-CM | POA: Diagnosis not present

## 2019-10-13 DIAGNOSIS — I5042 Chronic combined systolic (congestive) and diastolic (congestive) heart failure: Secondary | ICD-10-CM | POA: Diagnosis not present

## 2019-10-14 ENCOUNTER — Telehealth: Payer: Self-pay

## 2019-10-14 NOTE — Telephone Encounter (Signed)
Called patient to schedule Annual Medicare Wellness Exam. He states he has moved to [place] will no longer be seeing Dr. Bryan Lemma. He will be transferring to a PCP in [place].

## 2019-10-15 DIAGNOSIS — I5042 Chronic combined systolic (congestive) and diastolic (congestive) heart failure: Secondary | ICD-10-CM | POA: Diagnosis not present

## 2019-10-15 DIAGNOSIS — N319 Neuromuscular dysfunction of bladder, unspecified: Secondary | ICD-10-CM | POA: Diagnosis not present

## 2019-10-15 DIAGNOSIS — S72002D Fracture of unspecified part of neck of left femur, subsequent encounter for closed fracture with routine healing: Secondary | ICD-10-CM | POA: Diagnosis not present

## 2019-10-15 DIAGNOSIS — I130 Hypertensive heart and chronic kidney disease with heart failure and stage 1 through stage 4 chronic kidney disease, or unspecified chronic kidney disease: Secondary | ICD-10-CM | POA: Diagnosis not present

## 2019-10-15 DIAGNOSIS — I255 Ischemic cardiomyopathy: Secondary | ICD-10-CM | POA: Diagnosis not present

## 2019-10-15 DIAGNOSIS — G3184 Mild cognitive impairment, so stated: Secondary | ICD-10-CM | POA: Diagnosis not present

## 2019-10-15 DIAGNOSIS — N1832 Chronic kidney disease, stage 3b: Secondary | ICD-10-CM | POA: Diagnosis not present

## 2019-10-15 DIAGNOSIS — I4821 Permanent atrial fibrillation: Secondary | ICD-10-CM | POA: Diagnosis not present

## 2019-10-15 DIAGNOSIS — R339 Retention of urine, unspecified: Secondary | ICD-10-CM | POA: Diagnosis not present

## 2019-10-16 DIAGNOSIS — I5042 Chronic combined systolic (congestive) and diastolic (congestive) heart failure: Secondary | ICD-10-CM | POA: Diagnosis not present

## 2019-10-16 DIAGNOSIS — I130 Hypertensive heart and chronic kidney disease with heart failure and stage 1 through stage 4 chronic kidney disease, or unspecified chronic kidney disease: Secondary | ICD-10-CM | POA: Diagnosis not present

## 2019-10-16 DIAGNOSIS — N1832 Chronic kidney disease, stage 3b: Secondary | ICD-10-CM | POA: Diagnosis not present

## 2019-10-16 DIAGNOSIS — G3184 Mild cognitive impairment, so stated: Secondary | ICD-10-CM | POA: Diagnosis not present

## 2019-10-16 DIAGNOSIS — I4821 Permanent atrial fibrillation: Secondary | ICD-10-CM | POA: Diagnosis not present

## 2019-10-16 DIAGNOSIS — I255 Ischemic cardiomyopathy: Secondary | ICD-10-CM | POA: Diagnosis not present

## 2019-10-16 DIAGNOSIS — S72002D Fracture of unspecified part of neck of left femur, subsequent encounter for closed fracture with routine healing: Secondary | ICD-10-CM | POA: Diagnosis not present

## 2019-10-16 DIAGNOSIS — R339 Retention of urine, unspecified: Secondary | ICD-10-CM | POA: Diagnosis not present

## 2019-10-16 DIAGNOSIS — N319 Neuromuscular dysfunction of bladder, unspecified: Secondary | ICD-10-CM | POA: Diagnosis not present

## 2019-10-20 DIAGNOSIS — S72002D Fracture of unspecified part of neck of left femur, subsequent encounter for closed fracture with routine healing: Secondary | ICD-10-CM | POA: Diagnosis not present

## 2019-10-20 DIAGNOSIS — R339 Retention of urine, unspecified: Secondary | ICD-10-CM | POA: Diagnosis not present

## 2019-10-20 DIAGNOSIS — G3184 Mild cognitive impairment, so stated: Secondary | ICD-10-CM | POA: Diagnosis not present

## 2019-10-20 DIAGNOSIS — I4821 Permanent atrial fibrillation: Secondary | ICD-10-CM | POA: Diagnosis not present

## 2019-10-20 DIAGNOSIS — I255 Ischemic cardiomyopathy: Secondary | ICD-10-CM | POA: Diagnosis not present

## 2019-10-20 DIAGNOSIS — N1832 Chronic kidney disease, stage 3b: Secondary | ICD-10-CM | POA: Diagnosis not present

## 2019-10-20 DIAGNOSIS — N319 Neuromuscular dysfunction of bladder, unspecified: Secondary | ICD-10-CM | POA: Diagnosis not present

## 2019-10-20 DIAGNOSIS — I5042 Chronic combined systolic (congestive) and diastolic (congestive) heart failure: Secondary | ICD-10-CM | POA: Diagnosis not present

## 2019-10-20 DIAGNOSIS — I130 Hypertensive heart and chronic kidney disease with heart failure and stage 1 through stage 4 chronic kidney disease, or unspecified chronic kidney disease: Secondary | ICD-10-CM | POA: Diagnosis not present

## 2019-11-13 DIAGNOSIS — I2510 Atherosclerotic heart disease of native coronary artery without angina pectoris: Secondary | ICD-10-CM | POA: Diagnosis not present

## 2019-11-13 DIAGNOSIS — I255 Ischemic cardiomyopathy: Secondary | ICD-10-CM | POA: Diagnosis not present

## 2019-11-13 DIAGNOSIS — S7292XA Unspecified fracture of left femur, initial encounter for closed fracture: Secondary | ICD-10-CM | POA: Diagnosis not present

## 2019-11-13 DIAGNOSIS — R0602 Shortness of breath: Secondary | ICD-10-CM | POA: Diagnosis not present

## 2019-11-13 DIAGNOSIS — I2584 Coronary atherosclerosis due to calcified coronary lesion: Secondary | ICD-10-CM | POA: Diagnosis not present

## 2019-11-13 DIAGNOSIS — I5042 Chronic combined systolic (congestive) and diastolic (congestive) heart failure: Secondary | ICD-10-CM | POA: Diagnosis not present

## 2019-11-13 DIAGNOSIS — I10 Essential (primary) hypertension: Secondary | ICD-10-CM | POA: Diagnosis not present

## 2019-11-13 DIAGNOSIS — I4821 Permanent atrial fibrillation: Secondary | ICD-10-CM | POA: Diagnosis not present

## 2019-11-13 DIAGNOSIS — S72002A Fracture of unspecified part of neck of left femur, initial encounter for closed fracture: Secondary | ICD-10-CM | POA: Diagnosis not present

## 2019-11-13 DIAGNOSIS — M25552 Pain in left hip: Secondary | ICD-10-CM | POA: Diagnosis not present

## 2019-11-13 DIAGNOSIS — K219 Gastro-esophageal reflux disease without esophagitis: Secondary | ICD-10-CM | POA: Diagnosis not present

## 2019-12-15 DIAGNOSIS — I252 Old myocardial infarction: Secondary | ICD-10-CM | POA: Diagnosis not present

## 2019-12-15 DIAGNOSIS — N183 Chronic kidney disease, stage 3 unspecified: Secondary | ICD-10-CM | POA: Diagnosis not present

## 2019-12-15 DIAGNOSIS — Z888 Allergy status to other drugs, medicaments and biological substances status: Secondary | ICD-10-CM | POA: Diagnosis not present

## 2019-12-15 DIAGNOSIS — Z466 Encounter for fitting and adjustment of urinary device: Secondary | ICD-10-CM | POA: Diagnosis not present

## 2019-12-15 DIAGNOSIS — Z79899 Other long term (current) drug therapy: Secondary | ICD-10-CM | POA: Diagnosis not present

## 2019-12-15 DIAGNOSIS — Z882 Allergy status to sulfonamides status: Secondary | ICD-10-CM | POA: Diagnosis not present

## 2019-12-15 DIAGNOSIS — E7800 Pure hypercholesterolemia, unspecified: Secondary | ICD-10-CM | POA: Diagnosis not present

## 2019-12-15 DIAGNOSIS — R338 Other retention of urine: Secondary | ICD-10-CM | POA: Diagnosis not present

## 2019-12-15 DIAGNOSIS — T83098A Other mechanical complication of other indwelling urethral catheter, initial encounter: Secondary | ICD-10-CM | POA: Diagnosis not present

## 2020-01-10 ENCOUNTER — Other Ambulatory Visit (HOSPITAL_COMMUNITY): Payer: Self-pay | Admitting: Cardiology

## 2020-02-23 DIAGNOSIS — I255 Ischemic cardiomyopathy: Secondary | ICD-10-CM | POA: Diagnosis not present

## 2020-02-23 DIAGNOSIS — I4821 Permanent atrial fibrillation: Secondary | ICD-10-CM | POA: Diagnosis not present

## 2020-02-23 DIAGNOSIS — K219 Gastro-esophageal reflux disease without esophagitis: Secondary | ICD-10-CM | POA: Diagnosis not present

## 2020-02-23 DIAGNOSIS — S72002D Fracture of unspecified part of neck of left femur, subsequent encounter for closed fracture with routine healing: Secondary | ICD-10-CM | POA: Diagnosis not present

## 2020-02-23 DIAGNOSIS — I2510 Atherosclerotic heart disease of native coronary artery without angina pectoris: Secondary | ICD-10-CM | POA: Diagnosis not present

## 2020-02-23 DIAGNOSIS — Z951 Presence of aortocoronary bypass graft: Secondary | ICD-10-CM | POA: Diagnosis not present

## 2020-02-23 DIAGNOSIS — R0602 Shortness of breath: Secondary | ICD-10-CM | POA: Diagnosis not present

## 2020-02-23 DIAGNOSIS — I10 Essential (primary) hypertension: Secondary | ICD-10-CM | POA: Diagnosis not present

## 2020-02-23 DIAGNOSIS — I5042 Chronic combined systolic (congestive) and diastolic (congestive) heart failure: Secondary | ICD-10-CM | POA: Diagnosis not present

## 2020-08-04 ENCOUNTER — Telehealth: Payer: Self-pay | Admitting: Family Medicine

## 2020-08-04 NOTE — Telephone Encounter (Signed)
Please remove PCP.  According to the Attribution list patient moved to [facility] 10/14/19.

## 2020-08-23 DIAGNOSIS — T83511A Infection and inflammatory reaction due to indwelling urethral catheter, initial encounter: Secondary | ICD-10-CM | POA: Diagnosis not present

## 2020-08-23 DIAGNOSIS — R531 Weakness: Secondary | ICD-10-CM | POA: Diagnosis not present

## 2020-08-23 DIAGNOSIS — U071 COVID-19: Secondary | ICD-10-CM | POA: Diagnosis not present

## 2020-08-23 DIAGNOSIS — T83098A Other mechanical complication of other indwelling urethral catheter, initial encounter: Secondary | ICD-10-CM | POA: Diagnosis not present

## 2020-10-07 DIAGNOSIS — I255 Ischemic cardiomyopathy: Secondary | ICD-10-CM | POA: Diagnosis not present

## 2020-10-07 DIAGNOSIS — I2510 Atherosclerotic heart disease of native coronary artery without angina pectoris: Secondary | ICD-10-CM | POA: Diagnosis not present

## 2020-10-07 DIAGNOSIS — I5042 Chronic combined systolic (congestive) and diastolic (congestive) heart failure: Secondary | ICD-10-CM | POA: Diagnosis not present

## 2020-10-07 DIAGNOSIS — I10 Essential (primary) hypertension: Secondary | ICD-10-CM | POA: Diagnosis not present

## 2020-10-07 DIAGNOSIS — I4821 Permanent atrial fibrillation: Secondary | ICD-10-CM | POA: Diagnosis not present

## 2020-10-07 DIAGNOSIS — I2584 Coronary atherosclerosis due to calcified coronary lesion: Secondary | ICD-10-CM | POA: Diagnosis not present

## 2022-04-28 DEATH — deceased
